# Patient Record
Sex: Male | Born: 1960
Health system: Southern US, Community
[De-identification: ages and names within clinical notes are randomized; demographics above are authoritative.]

## PROBLEM LIST (undated history)

## (undated) DIAGNOSIS — E785 Hyperlipidemia, unspecified: Secondary | ICD-10-CM

## (undated) DIAGNOSIS — I739 Peripheral vascular disease, unspecified: Secondary | ICD-10-CM

## (undated) DIAGNOSIS — I10 Essential (primary) hypertension: Secondary | ICD-10-CM

## (undated) DIAGNOSIS — I2510 Atherosclerotic heart disease of native coronary artery without angina pectoris: Secondary | ICD-10-CM

## (undated) DIAGNOSIS — G5603 Carpal tunnel syndrome, bilateral upper limbs: Secondary | ICD-10-CM

## (undated) DIAGNOSIS — K5730 Diverticulosis of large intestine without perforation or abscess without bleeding: Secondary | ICD-10-CM

## (undated) DIAGNOSIS — F1010 Alcohol abuse, uncomplicated: Secondary | ICD-10-CM

## (undated) DIAGNOSIS — Z8614 Personal history of Methicillin resistant Staphylococcus aureus infection: Secondary | ICD-10-CM

## (undated) DIAGNOSIS — D649 Anemia, unspecified: Secondary | ICD-10-CM

## (undated) DIAGNOSIS — K760 Fatty (change of) liver, not elsewhere classified: Secondary | ICD-10-CM

## (undated) DIAGNOSIS — M1990 Unspecified osteoarthritis, unspecified site: Secondary | ICD-10-CM

## (undated) DIAGNOSIS — R030 Elevated blood-pressure reading, without diagnosis of hypertension: Secondary | ICD-10-CM

## (undated) DIAGNOSIS — R7401 Elevation of levels of liver transaminase levels: Secondary | ICD-10-CM

## (undated) DIAGNOSIS — I700 Atherosclerosis of aorta: Secondary | ICD-10-CM

## (undated) DIAGNOSIS — I5189 Other ill-defined heart diseases: Secondary | ICD-10-CM

## (undated) HISTORY — PX: COLONOSCOPY: SHX174

## (undated) HISTORY — PX: SKIN GRAFT: SHX250

---

## 2007-01-31 ENCOUNTER — Inpatient Hospital Stay: Payer: Self-pay | Admitting: Internal Medicine

## 2007-03-08 ENCOUNTER — Encounter: Payer: Self-pay | Admitting: General Practice

## 2011-01-27 ENCOUNTER — Emergency Department: Payer: Self-pay | Admitting: *Deleted

## 2013-08-08 ENCOUNTER — Emergency Department: Payer: Self-pay | Admitting: Internal Medicine

## 2014-08-21 ENCOUNTER — Ambulatory Visit: Payer: Self-pay | Admitting: Internal Medicine

## 2014-11-25 ENCOUNTER — Emergency Department
Admit: 2014-11-25 | Disposition: A | Discharge status: Home / Self Care | Payer: Self-pay | Admitting: Emergency Medicine

## 2014-11-25 LAB — COMPREHENSIVE METABOLIC PANEL
ALBUMIN: 4.1 g/dL
ALK PHOS: 75 U/L
ALT: 103 U/L — AB
ANION GAP: 17 — AB (ref 7–16)
BUN: 13 mg/dL
Bilirubin,Total: 1.2 mg/dL
Calcium, Total: 8.4 mg/dL — ABNORMAL LOW
Chloride: 107 mmol/L
Co2: 16 mmol/L — ABNORMAL LOW
Creatinine: 1.05 mg/dL
EGFR (Non-African Amer.): >60
GLUCOSE: 91 mg/dL
Potassium: 3.6 mmol/L
SGOT(AST): 171 U/L — ABNORMAL HIGH
Sodium: 140 mmol/L
TOTAL PROTEIN: 7.2 g/dL

## 2014-11-25 LAB — CBC
HCT: 41.8 % (ref 40.0–52.0)
HGB: 14.1 g/dL (ref 13.0–18.0)
MCH: 34.3 pg — ABNORMAL HIGH (ref 26.0–34.0)
MCHC: 33.9 g/dL (ref 32.0–36.0)
MCV: 101 fL — ABNORMAL HIGH (ref 80–100)
Platelet: 276 10*3/uL (ref 150–440)
RBC: 4.12 10*6/uL — AB (ref 4.40–5.90)
RDW: 13 % (ref 11.5–14.5)
WBC: 14.5 10*3/uL — AB (ref 3.8–10.6)

## 2014-11-28 DIAGNOSIS — T23209A Burn of second degree of unspecified hand, unspecified site, initial encounter: Secondary | ICD-10-CM | POA: Insufficient documentation

## 2014-11-28 DIAGNOSIS — T22299A Burn of second degree of multiple sites of unspecified shoulder and upper limb, except wrist and hand, initial encounter: Secondary | ICD-10-CM | POA: Insufficient documentation

## 2015-09-10 DIAGNOSIS — E785 Hyperlipidemia, unspecified: Secondary | ICD-10-CM | POA: Diagnosis not present

## 2015-09-10 DIAGNOSIS — N400 Enlarged prostate without lower urinary tract symptoms: Secondary | ICD-10-CM | POA: Diagnosis not present

## 2015-09-18 DIAGNOSIS — M545 Low back pain: Secondary | ICD-10-CM | POA: Diagnosis not present

## 2017-02-01 DIAGNOSIS — L237 Allergic contact dermatitis due to plants, except food: Secondary | ICD-10-CM | POA: Diagnosis not present

## 2017-02-01 DIAGNOSIS — L309 Dermatitis, unspecified: Secondary | ICD-10-CM | POA: Diagnosis not present

## 2017-11-26 DIAGNOSIS — S0081XA Abrasion of other part of head, initial encounter: Secondary | ICD-10-CM | POA: Diagnosis not present

## 2017-11-26 DIAGNOSIS — S63609A Unspecified sprain of unspecified thumb, initial encounter: Secondary | ICD-10-CM | POA: Diagnosis not present

## 2017-11-26 DIAGNOSIS — S0083XA Contusion of other part of head, initial encounter: Secondary | ICD-10-CM | POA: Diagnosis not present

## 2017-12-08 DIAGNOSIS — M5412 Radiculopathy, cervical region: Secondary | ICD-10-CM | POA: Diagnosis not present

## 2017-12-08 DIAGNOSIS — K210 Gastro-esophageal reflux disease with esophagitis: Secondary | ICD-10-CM | POA: Diagnosis not present

## 2017-12-08 DIAGNOSIS — I10 Essential (primary) hypertension: Secondary | ICD-10-CM | POA: Diagnosis not present

## 2017-12-08 DIAGNOSIS — M5030 Other cervical disc degeneration, unspecified cervical region: Secondary | ICD-10-CM | POA: Diagnosis not present

## 2017-12-08 DIAGNOSIS — M545 Low back pain: Secondary | ICD-10-CM | POA: Diagnosis not present

## 2017-12-08 DIAGNOSIS — E785 Hyperlipidemia, unspecified: Secondary | ICD-10-CM | POA: Diagnosis not present

## 2017-12-08 DIAGNOSIS — M1990 Unspecified osteoarthritis, unspecified site: Secondary | ICD-10-CM | POA: Diagnosis not present

## 2017-12-08 DIAGNOSIS — N400 Enlarged prostate without lower urinary tract symptoms: Secondary | ICD-10-CM | POA: Diagnosis not present

## 2018-01-19 DIAGNOSIS — I10 Essential (primary) hypertension: Secondary | ICD-10-CM | POA: Diagnosis not present

## 2018-01-19 DIAGNOSIS — M5412 Radiculopathy, cervical region: Secondary | ICD-10-CM | POA: Diagnosis not present

## 2018-01-19 DIAGNOSIS — M5030 Other cervical disc degeneration, unspecified cervical region: Secondary | ICD-10-CM | POA: Diagnosis not present

## 2018-01-19 DIAGNOSIS — N400 Enlarged prostate without lower urinary tract symptoms: Secondary | ICD-10-CM | POA: Diagnosis not present

## 2018-01-19 DIAGNOSIS — M545 Low back pain: Secondary | ICD-10-CM | POA: Diagnosis not present

## 2018-01-19 DIAGNOSIS — M1990 Unspecified osteoarthritis, unspecified site: Secondary | ICD-10-CM | POA: Diagnosis not present

## 2018-01-19 DIAGNOSIS — K210 Gastro-esophageal reflux disease with esophagitis: Secondary | ICD-10-CM | POA: Diagnosis not present

## 2018-01-19 DIAGNOSIS — D7589 Other specified diseases of blood and blood-forming organs: Secondary | ICD-10-CM | POA: Diagnosis not present

## 2018-01-19 DIAGNOSIS — E785 Hyperlipidemia, unspecified: Secondary | ICD-10-CM | POA: Diagnosis not present

## 2018-03-05 DIAGNOSIS — L0201 Cutaneous abscess of face: Secondary | ICD-10-CM | POA: Diagnosis not present

## 2018-09-19 DIAGNOSIS — M1990 Unspecified osteoarthritis, unspecified site: Secondary | ICD-10-CM | POA: Diagnosis not present

## 2018-09-19 DIAGNOSIS — D7589 Other specified diseases of blood and blood-forming organs: Secondary | ICD-10-CM | POA: Diagnosis not present

## 2018-09-19 DIAGNOSIS — E785 Hyperlipidemia, unspecified: Secondary | ICD-10-CM | POA: Diagnosis not present

## 2018-09-19 DIAGNOSIS — M545 Low back pain: Secondary | ICD-10-CM | POA: Diagnosis not present

## 2018-09-19 DIAGNOSIS — I10 Essential (primary) hypertension: Secondary | ICD-10-CM | POA: Diagnosis not present

## 2018-09-19 DIAGNOSIS — N400 Enlarged prostate without lower urinary tract symptoms: Secondary | ICD-10-CM | POA: Diagnosis not present

## 2018-09-19 DIAGNOSIS — K210 Gastro-esophageal reflux disease with esophagitis: Secondary | ICD-10-CM | POA: Diagnosis not present

## 2018-09-19 DIAGNOSIS — M5412 Radiculopathy, cervical region: Secondary | ICD-10-CM | POA: Diagnosis not present

## 2018-09-19 DIAGNOSIS — M5030 Other cervical disc degeneration, unspecified cervical region: Secondary | ICD-10-CM | POA: Diagnosis not present

## 2018-09-20 ENCOUNTER — Other Ambulatory Visit: Payer: Self-pay | Admitting: Internal Medicine

## 2018-09-20 DIAGNOSIS — M5412 Radiculopathy, cervical region: Secondary | ICD-10-CM

## 2018-09-29 ENCOUNTER — Ambulatory Visit: Payer: Self-pay

## 2018-10-28 DIAGNOSIS — N400 Enlarged prostate without lower urinary tract symptoms: Secondary | ICD-10-CM | POA: Diagnosis not present

## 2018-10-28 DIAGNOSIS — K210 Gastro-esophageal reflux disease with esophagitis: Secondary | ICD-10-CM | POA: Diagnosis not present

## 2018-10-28 DIAGNOSIS — E785 Hyperlipidemia, unspecified: Secondary | ICD-10-CM | POA: Diagnosis not present

## 2018-10-28 DIAGNOSIS — A059 Bacterial foodborne intoxication, unspecified: Secondary | ICD-10-CM | POA: Diagnosis not present

## 2018-10-28 DIAGNOSIS — M5412 Radiculopathy, cervical region: Secondary | ICD-10-CM | POA: Diagnosis not present

## 2018-10-28 DIAGNOSIS — D7589 Other specified diseases of blood and blood-forming organs: Secondary | ICD-10-CM | POA: Diagnosis not present

## 2018-10-28 DIAGNOSIS — M5030 Other cervical disc degeneration, unspecified cervical region: Secondary | ICD-10-CM | POA: Diagnosis not present

## 2018-10-28 DIAGNOSIS — M545 Low back pain: Secondary | ICD-10-CM | POA: Diagnosis not present

## 2018-10-28 DIAGNOSIS — I10 Essential (primary) hypertension: Secondary | ICD-10-CM | POA: Diagnosis not present

## 2018-11-08 MED FILL — MELOXICAM 15 MG TABLET: 15 | 30 days supply | Qty: 30 | Fill #0

## 2018-11-08 MED FILL — GABAPENTIN 300 MG CAPSULE: 300 | 30 days supply | Qty: 90 | Fill #0

## 2018-11-11 MED FILL — CHLORTHALIDONE 25 MG TABS: 25 | 30 days supply | Qty: 15 | Fill #0

## 2018-12-02 MED FILL — MELOXICAM 15 MG TABLET: 15 | 30 days supply | Qty: 30 | Fill #0 | Status: TO

## 2018-12-03 MED FILL — GABAPENTIN 300 MG CAPSULE: 300 | 30 days supply | Qty: 90 | Fill #0

## 2018-12-05 MED FILL — CHLORTHALIDONE 25 MG TABS: 25 | 30 days supply | Qty: 15 | Fill #0 | Status: TO

## 2018-12-27 MED FILL — GABAPENTIN 300 MG CAPSULE: 300 | 30 days supply | Qty: 90 | Fill #0

## 2019-01-28 DIAGNOSIS — Z03818 Encounter for observation for suspected exposure to other biological agents ruled out: Secondary | ICD-10-CM | POA: Diagnosis not present

## 2019-01-28 DIAGNOSIS — R52 Pain, unspecified: Secondary | ICD-10-CM | POA: Diagnosis not present

## 2019-01-28 DIAGNOSIS — R110 Nausea: Secondary | ICD-10-CM | POA: Diagnosis not present

## 2019-02-14 DIAGNOSIS — D7589 Other specified diseases of blood and blood-forming organs: Secondary | ICD-10-CM | POA: Diagnosis not present

## 2019-02-14 DIAGNOSIS — M5412 Radiculopathy, cervical region: Secondary | ICD-10-CM | POA: Diagnosis not present

## 2019-02-14 DIAGNOSIS — N400 Enlarged prostate without lower urinary tract symptoms: Secondary | ICD-10-CM | POA: Diagnosis not present

## 2019-02-14 DIAGNOSIS — E785 Hyperlipidemia, unspecified: Secondary | ICD-10-CM | POA: Diagnosis not present

## 2019-02-14 DIAGNOSIS — M5030 Other cervical disc degeneration, unspecified cervical region: Secondary | ICD-10-CM | POA: Diagnosis not present

## 2019-02-14 DIAGNOSIS — M545 Low back pain: Secondary | ICD-10-CM | POA: Diagnosis not present

## 2019-02-14 DIAGNOSIS — F17210 Nicotine dependence, cigarettes, uncomplicated: Secondary | ICD-10-CM | POA: Diagnosis not present

## 2019-02-14 DIAGNOSIS — Z1331 Encounter for screening for depression: Secondary | ICD-10-CM | POA: Diagnosis not present

## 2019-02-14 DIAGNOSIS — I10 Essential (primary) hypertension: Secondary | ICD-10-CM | POA: Diagnosis not present

## 2019-02-14 DIAGNOSIS — K210 Gastro-esophageal reflux disease with esophagitis: Secondary | ICD-10-CM | POA: Diagnosis not present

## 2019-02-14 DIAGNOSIS — M1990 Unspecified osteoarthritis, unspecified site: Secondary | ICD-10-CM | POA: Diagnosis not present

## 2019-02-18 ENCOUNTER — Encounter: Payer: Self-pay | Admitting: Emergency Medicine

## 2019-02-18 ENCOUNTER — Emergency Department: Payer: 59

## 2019-02-18 ENCOUNTER — Other Ambulatory Visit: Payer: Self-pay

## 2019-02-18 ENCOUNTER — Emergency Department
Admission: EM | Admit: 2019-02-18 | Discharge: 2019-02-18 | Disposition: A | Discharge status: Home / Self Care | Payer: 59 | Attending: Emergency Medicine | Admitting: Emergency Medicine

## 2019-02-18 DIAGNOSIS — R070 Pain in throat: Secondary | ICD-10-CM | POA: Insufficient documentation

## 2019-02-18 DIAGNOSIS — B349 Viral infection, unspecified: Secondary | ICD-10-CM

## 2019-02-18 DIAGNOSIS — Z20828 Contact with and (suspected) exposure to other viral communicable diseases: Secondary | ICD-10-CM | POA: Insufficient documentation

## 2019-02-18 DIAGNOSIS — R0789 Other chest pain: Secondary | ICD-10-CM | POA: Insufficient documentation

## 2019-02-18 DIAGNOSIS — R0602 Shortness of breath: Secondary | ICD-10-CM | POA: Diagnosis not present

## 2019-02-18 DIAGNOSIS — R079 Chest pain, unspecified: Secondary | ICD-10-CM | POA: Diagnosis not present

## 2019-02-18 DIAGNOSIS — F17200 Nicotine dependence, unspecified, uncomplicated: Secondary | ICD-10-CM | POA: Insufficient documentation

## 2019-02-18 DIAGNOSIS — I10 Essential (primary) hypertension: Secondary | ICD-10-CM | POA: Insufficient documentation

## 2019-02-18 HISTORY — DX: Essential (primary) hypertension: I10

## 2019-02-18 LAB — CBC
HCT: 45.2 % (ref 39.0–52.0)
Hemoglobin: 15.9 g/dL (ref 13.0–17.0)
MCH: 34.3 pg — ABNORMAL HIGH (ref 26.0–34.0)
MCHC: 35.2 g/dL (ref 30.0–36.0)
MCV: 97.4 fL (ref 80.0–100.0)
Platelets: 246 10*3/uL (ref 150–400)
RBC: 4.64 MIL/uL (ref 4.22–5.81)
RDW: 12.8 % (ref 11.5–15.5)
WBC: 9.1 10*3/uL (ref 4.0–10.5)
nRBC: 0 % (ref 0.0–0.2)

## 2019-02-18 LAB — BASIC METABOLIC PANEL
Anion gap: 16 — ABNORMAL HIGH (ref 5–15)
BUN: 11 mg/dL (ref 6–20)
CO2: 21 mmol/L — ABNORMAL LOW (ref 22–32)
Calcium: 9.6 mg/dL (ref 8.9–10.3)
Chloride: 100 mmol/L (ref 98–111)
Creatinine, Ser: 0.86 mg/dL (ref 0.61–1.24)
GFR calc Af Amer: >60 mL/min (ref >60)
GFR calc non Af Amer: >60 mL/min (ref >60)
Glucose, Bld: 112 mg/dL — ABNORMAL HIGH (ref 70–99)
Potassium: 3.4 mmol/L — ABNORMAL LOW (ref 3.5–5.1)
Sodium: 137 mmol/L (ref 135–145)

## 2019-02-18 LAB — TROPONIN I (HIGH SENSITIVITY): Troponin I (High Sensitivity): 6 ng/L (ref <18)

## 2019-02-18 MED ORDER — ONDANSETRON 4 MG PO TBDP
4.0000 mg | ORAL_TABLET | Freq: Once | ORAL | Status: AC
  Administered 2019-02-18: 4 mg via ORAL
  Filled 2019-02-18: qty 1

## 2019-02-18 NOTE — ED Provider Notes (Signed)
[facility] Emergency Department Provider Note   ____________________________________________    I have reviewed the triage vital signs and the nursing notes.   HISTORY  Chief Complaint Chest discomfort, myalgias,    HPI Ryan Rose is a 58 y.o. male who presents with complaints of chest discomfort, sore throat, body aches, sweating.  Patient reports yesterday morning he developed a burning in his chest which resolved after about 15 minutes.  The rest of the day he describes sore throat, body aches, sweating and mild nausea.  He reports he had this 2 weeks ago and had a COVID swab which was negative.  However he does think that he has been exposed to coronavirus at work.  Does not know if he has had fevers.  Currently reports he is feeling somewhat better.  Does not have any chest pain now.   Past Medical History:  Diagnosis Date  . Hypertension     There are no active problems to display for this patient.     Prior to Admission medications   Not on File     Allergies Sulfa antibiotics  No family history on file.  Social History Social History   Tobacco Use  . Smoking status: Current Every Day Smoker  . Smokeless tobacco: Never Used  Substance Use Topics  . Alcohol use: Yes    Comment: occasional  . Drug use: Not on file    Review of Systems  Constitutional: No fever/chills Eyes: No visual changes.  ENT: Mild sore throat Cardiovascular: As above Respiratory: No shortness of breath Gastrointestinal: No abdominal pain.  No nausea, no vomiting.   Genitourinary: Negative for dysuria. Musculoskeletal: Myalgias Skin: Negative for rash. Neurological: Negative for headaches    ____________________________________________   PHYSICAL EXAM:  VITAL SIGNS: ED Triage Vitals  Enc Vitals Group     BP 02/18/19 0254 (!) 152/100     Pulse Rate 02/18/19 0254 100     Resp 02/18/19 0254 18     Temp 02/18/19 0254 98.6 F (37 C)      Temp Source 02/18/19 0254 Oral     SpO2 02/18/19 0254 98 %     Weight 02/18/19 0248 78 kg (172 lb)     Height 02/18/19 0248 1.74 m (5' 8.5")     Head Circumference --      Peak Flow --      Pain Score 02/18/19 0247 5     Pain Loc --      Pain Edu? --      Excl. in [place]? --     Constitutional: Alert and oriented.  Eyes: Conjunctivae are normal.  Head: Atraumatic. Nose: No congestion/rhinnorhea. Mouth/Throat: Mucous membranes are moist.   Neck:  Painless ROM Cardiovascular: Normal rate, regular rhythm.  Good peripheral circulation. Respiratory: Normal respiratory effort.  No retractions.  Gastrointestinal: Soft and nontender. No distention.  No CVA tenderness.  Musculoskeletal: No lower extremity tenderness nor edema.  Warm and well perfused Neurologic:  Normal speech and language. No gross focal neurologic deficits are appreciated.  Skin:  Skin is warm, dry and intact. No rash noted. Psychiatric: Mood and affect are normal. Speech and behavior are normal.  ____________________________________________   LABS (all labs ordered are listed, but only abnormal results are displayed)  Labs Reviewed  BASIC METABOLIC PANEL - Abnormal; Notable for the following components:      Result Value   Potassium 3.4 (*)    CO2 21 (*)    Glucose,  Bld 112 (*)    Anion gap 16 (*)    All other components within normal limits  CBC - Abnormal; Notable for the following components:   MCH 34.3 (*)    All other components within normal limits  NOVEL CORONAVIRUS, NAA (HOSPITAL ORDER, SEND-OUT TO REF LAB)  TROPONIN I (HIGH SENSITIVITY)  TROPONIN I (HIGH SENSITIVITY)   ____________________________________________  EKG  ED ECG REPORT I, Jene Everyobert , the attending physician, personally viewed and interpreted this ECG.  Date: 02/18/2019  Rhythm: normal sinus rhythm QRS Axis: normal Intervals: normal ST/T Wave abnormalities: normal Narrative Interpretation: no evidence of acute ischemia   ____________________________________________  RADIOLOGY  Chest x-ray unremarkable ____________________________________________   PROCEDURES  Procedure(s) performed: No  Procedures   Critical Care performed: No ____________________________________________   INITIAL IMPRESSION / ASSESSMENT AND PLAN / ED COURSE  Pertinent labs & imaging results that were available during my care of the patient were reviewed by me and considered in my medical decision making (see chart for details).  Patient presents with reports of burning chest discomfort yesterday morning, which resolved relatively quickly, suspicious for gastritis/GERD.  He also describes having body aches, cramps in his legs and feeling "sweaty yesterday ".  Has not had any recurrence of chest pain.  Denies shortness of breath .  No cough.  Negative COVID swab 2 weeks ago.,  Given symptoms we will resend test.  EKG is normal, HPI not consistent with ACS PE pericarditis.  Labs overall quite reassuring.  Appropriate for discharge with outpatient follow-up given that the patient is asymptomatic at this time..  Follow-up with PCP regarding continued elevated blood pressure    ____________________________________________   FINAL CLINICAL IMPRESSION(S) / ED DIAGNOSES  Final diagnoses:  SOB (shortness of breath)  Chest pain        Note:  This document was prepared using Dragon voice recognition software and may include unintentional dictation errors.   Jene Every, , MD 02/18/19 [phone]

## 2019-02-18 NOTE — ED Triage Notes (Signed)
Pt to triage via wheelchair. Pt reprots chest pain that started on Friday morning. Pt reports his blood pressure has been high and he has been feeling short of breath. Pt states he also has a sore throat. Pt reports the chest pain felt like heart burn. 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

## 2019-02-20 LAB — NOVEL CORONAVIRUS, NAA (HOSP ORDER, SEND-OUT TO REF LAB; TAT 18-24 HRS): SARS-CoV-2, NAA: NOT DETECTED

## 2019-02-21 ENCOUNTER — Telehealth: Payer: Self-pay | Admitting: Emergency Medicine

## 2019-02-21 NOTE — Telephone Encounter (Signed)
Called patient and infromed of negative covid 19 test result.

## 2019-03-17 DIAGNOSIS — M5412 Radiculopathy, cervical region: Secondary | ICD-10-CM | POA: Diagnosis not present

## 2019-03-17 DIAGNOSIS — E785 Hyperlipidemia, unspecified: Secondary | ICD-10-CM | POA: Diagnosis not present

## 2019-03-17 DIAGNOSIS — K210 Gastro-esophageal reflux disease with esophagitis: Secondary | ICD-10-CM | POA: Diagnosis not present

## 2019-03-17 DIAGNOSIS — M5030 Other cervical disc degeneration, unspecified cervical region: Secondary | ICD-10-CM | POA: Diagnosis not present

## 2019-03-17 DIAGNOSIS — I10 Essential (primary) hypertension: Secondary | ICD-10-CM | POA: Diagnosis not present

## 2019-03-17 DIAGNOSIS — D7589 Other specified diseases of blood and blood-forming organs: Secondary | ICD-10-CM | POA: Diagnosis not present

## 2019-03-17 DIAGNOSIS — M545 Low back pain: Secondary | ICD-10-CM | POA: Diagnosis not present

## 2019-03-17 DIAGNOSIS — M1990 Unspecified osteoarthritis, unspecified site: Secondary | ICD-10-CM | POA: Diagnosis not present

## 2019-03-17 DIAGNOSIS — F17200 Nicotine dependence, unspecified, uncomplicated: Secondary | ICD-10-CM | POA: Diagnosis not present

## 2019-04-27 ENCOUNTER — Other Ambulatory Visit
Admission: AD | Admit: 2019-04-27 | Discharge: 2019-04-27 | Disposition: A | Discharge status: Home / Self Care | Payer: Worker's Compensation | Attending: Family Medicine | Admitting: Family Medicine

## 2019-07-17 ENCOUNTER — Other Ambulatory Visit
Admission: RE | Admit: 2019-07-17 | Discharge: 2019-07-17 | Disposition: A | Discharge status: Home / Self Care | Payer: Worker's Compensation | Attending: Family Medicine | Admitting: Family Medicine

## 2019-11-03 ENCOUNTER — Ambulatory Visit: Payer: 59 | Attending: Internal Medicine

## 2019-11-03 ENCOUNTER — Ambulatory Visit: Payer: Self-pay

## 2019-11-03 DIAGNOSIS — Z23 Encounter for immunization: Secondary | ICD-10-CM

## 2019-11-03 NOTE — Progress Notes (Signed)
   Covid-19 Vaccination Clinic  Name:  Ryan Rose    MRN: [id number] DOB: 19-Mar-1961  11/03/2019  [name] was observed post Covid-19 immunization for 15 minutes without incident. He was provided with Vaccine Information Sheet and instruction to access the V-Safe system.   [name] was instructed to call 911 with any severe reactions post vaccine: Marland Kitchen Difficulty breathing  . Swelling of face and throat  . A fast heartbeat  . A bad rash all over body  . Dizziness and weakness   Immunizations Administered    Name Date Dose VIS Date Route   Pfizer COVID-19 Vaccine 11/03/2019  8:59 AM 0.3 mL 07/21/2019 Intramuscular   Manufacturer: [employer], [employer]   Lot: [id number]   NDC: [address]-0

## 2019-11-29 ENCOUNTER — Ambulatory Visit: Payer: 59 | Attending: Internal Medicine

## 2019-11-29 DIAGNOSIS — Z23 Encounter for immunization: Secondary | ICD-10-CM

## 2019-11-29 NOTE — Progress Notes (Signed)
   Covid-19 Vaccination Clinic  Name:  Ryan Rose    MRN: [id number] DOB: [birth year]  11/29/2019  [name] was observed post Covid-19 immunization for 15 minutes without incident. He was provided with Vaccine Information Sheet and instruction to access the V-Safe system.   [name] was instructed to call 911 with any severe reactions post vaccine: Marland Kitchen Difficulty breathing  . Swelling of face and throat  . A fast heartbeat  . A bad rash all over body  . Dizziness and weakness   Immunizations Administered    Name Date Dose VIS Date Route   Pfizer COVID-19 Vaccine 11/29/2019  8:16 AM 0.3 mL 10/04/2018 Intramuscular   Manufacturer: [employer], [employer]   Lot: [id number]   NDC: [address]-1

## 2020-05-09 DIAGNOSIS — R2681 Unsteadiness on feet: Secondary | ICD-10-CM | POA: Diagnosis not present

## 2020-05-09 DIAGNOSIS — R200 Anesthesia of skin: Secondary | ICD-10-CM | POA: Diagnosis not present

## 2020-05-09 DIAGNOSIS — R29898 Other symptoms and signs involving the musculoskeletal system: Secondary | ICD-10-CM | POA: Diagnosis not present

## 2020-06-04 DIAGNOSIS — R202 Paresthesia of skin: Secondary | ICD-10-CM | POA: Diagnosis not present

## 2020-06-04 DIAGNOSIS — R29898 Other symptoms and signs involving the musculoskeletal system: Secondary | ICD-10-CM | POA: Diagnosis not present

## 2020-06-04 DIAGNOSIS — E519 Thiamine deficiency, unspecified: Secondary | ICD-10-CM | POA: Diagnosis not present

## 2020-06-04 DIAGNOSIS — E559 Vitamin D deficiency, unspecified: Secondary | ICD-10-CM | POA: Diagnosis not present

## 2020-06-04 DIAGNOSIS — R7309 Other abnormal glucose: Secondary | ICD-10-CM | POA: Diagnosis not present

## 2020-06-04 DIAGNOSIS — R200 Anesthesia of skin: Secondary | ICD-10-CM | POA: Diagnosis not present

## 2020-06-04 DIAGNOSIS — E531 Pyridoxine deficiency: Secondary | ICD-10-CM | POA: Diagnosis not present

## 2020-06-06 ENCOUNTER — Other Ambulatory Visit: Payer: Self-pay | Admitting: Neurology

## 2020-07-10 DIAGNOSIS — R29898 Other symptoms and signs involving the musculoskeletal system: Secondary | ICD-10-CM | POA: Diagnosis not present

## 2020-07-15 DIAGNOSIS — R200 Anesthesia of skin: Secondary | ICD-10-CM | POA: Diagnosis not present

## 2020-07-15 DIAGNOSIS — R29898 Other symptoms and signs involving the musculoskeletal system: Secondary | ICD-10-CM | POA: Diagnosis not present

## 2020-07-15 DIAGNOSIS — R202 Paresthesia of skin: Secondary | ICD-10-CM | POA: Diagnosis not present

## 2020-08-01 DIAGNOSIS — G5603 Carpal tunnel syndrome, bilateral upper limbs: Secondary | ICD-10-CM | POA: Diagnosis not present

## 2020-08-22 DIAGNOSIS — G5603 Carpal tunnel syndrome, bilateral upper limbs: Secondary | ICD-10-CM | POA: Diagnosis not present

## 2020-08-27 DIAGNOSIS — G5603 Carpal tunnel syndrome, bilateral upper limbs: Secondary | ICD-10-CM | POA: Insufficient documentation

## 2020-12-06 ENCOUNTER — Other Ambulatory Visit: Payer: Self-pay

## 2020-12-06 DIAGNOSIS — L729 Follicular cyst of the skin and subcutaneous tissue, unspecified: Secondary | ICD-10-CM | POA: Diagnosis not present

## 2020-12-06 DIAGNOSIS — L0201 Cutaneous abscess of face: Secondary | ICD-10-CM | POA: Diagnosis not present

## 2020-12-06 MED ORDER — DOXYCYCLINE HYCLATE 100 MG PO CAPS
ORAL_CAPSULE | ORAL | 0 refills | Status: DC
  Filled 2020-12-06: qty 14, 7d supply, fill #0

## 2021-05-12 DIAGNOSIS — E538 Deficiency of other specified B group vitamins: Secondary | ICD-10-CM | POA: Diagnosis not present

## 2021-05-12 DIAGNOSIS — E531 Pyridoxine deficiency: Secondary | ICD-10-CM | POA: Diagnosis not present

## 2021-05-12 DIAGNOSIS — E519 Thiamine deficiency, unspecified: Secondary | ICD-10-CM | POA: Diagnosis not present

## 2021-05-12 DIAGNOSIS — R29898 Other symptoms and signs involving the musculoskeletal system: Secondary | ICD-10-CM | POA: Diagnosis not present

## 2021-05-12 DIAGNOSIS — E559 Vitamin D deficiency, unspecified: Secondary | ICD-10-CM | POA: Diagnosis not present

## 2021-05-13 ENCOUNTER — Other Ambulatory Visit: Payer: Self-pay | Admitting: Student

## 2021-05-13 DIAGNOSIS — R29898 Other symptoms and signs involving the musculoskeletal system: Secondary | ICD-10-CM

## 2021-05-20 ENCOUNTER — Other Ambulatory Visit: Payer: Self-pay

## 2021-05-20 ENCOUNTER — Ambulatory Visit
Admission: RE | Admit: 2021-05-20 | Discharge: 2021-05-20 | Disposition: A | Discharge status: Home / Self Care | Payer: 59 | Source: Ambulatory Visit | Attending: Student | Admitting: Student

## 2021-05-20 DIAGNOSIS — R29898 Other symptoms and signs involving the musculoskeletal system: Secondary | ICD-10-CM

## 2021-05-20 DIAGNOSIS — M542 Cervicalgia: Secondary | ICD-10-CM | POA: Diagnosis not present

## 2021-05-20 DIAGNOSIS — R200 Anesthesia of skin: Secondary | ICD-10-CM | POA: Diagnosis not present

## 2021-05-21 ENCOUNTER — Other Ambulatory Visit: Payer: Self-pay

## 2021-05-21 MED ORDER — ERGOCALCIFEROL 1.25 MG (50000 UT) PO CAPS
ORAL_CAPSULE | ORAL | 0 refills | Status: DC
  Filled 2021-05-21: qty 8, 56d supply, fill #0

## 2021-06-26 DIAGNOSIS — G959 Disease of spinal cord, unspecified: Secondary | ICD-10-CM | POA: Diagnosis not present

## 2021-07-14 ENCOUNTER — Other Ambulatory Visit: Payer: Self-pay | Admitting: Neurosurgery

## 2021-08-06 ENCOUNTER — Encounter
Admission: RE | Admit: 2021-08-06 | Discharge: 2021-08-06 | Disposition: A | Discharge status: Home / Self Care | Payer: 59 | Source: Ambulatory Visit | Attending: Neurosurgery | Admitting: Neurosurgery

## 2021-08-06 ENCOUNTER — Inpatient Hospital Stay: Admission: RE | Admit: 2021-08-06 | Payer: 59 | Source: Ambulatory Visit

## 2021-08-06 ENCOUNTER — Other Ambulatory Visit: Payer: Self-pay

## 2021-08-06 DIAGNOSIS — Z01818 Encounter for other preprocedural examination: Secondary | ICD-10-CM | POA: Diagnosis not present

## 2021-08-06 DIAGNOSIS — Z01810 Encounter for preprocedural cardiovascular examination: Secondary | ICD-10-CM | POA: Diagnosis not present

## 2021-08-06 HISTORY — DX: Elevated blood-pressure reading, without diagnosis of hypertension: R03.0

## 2021-08-06 HISTORY — DX: Unspecified osteoarthritis, unspecified site: M19.90

## 2021-08-06 HISTORY — DX: Personal history of Methicillin resistant Staphylococcus aureus infection: Z86.14

## 2021-08-06 LAB — URINALYSIS, ROUTINE W REFLEX MICROSCOPIC
Bilirubin Urine: NEGATIVE
Glucose, UA: NEGATIVE mg/dL
Hgb urine dipstick: NEGATIVE
Ketones, ur: NEGATIVE mg/dL
Leukocytes,Ua: NEGATIVE
Nitrite: NEGATIVE
Protein, ur: 30 mg/dL — AB
Specific Gravity, Urine: 1.021 (ref 1.005–1.030)
pH: 5 (ref 5.0–8.0)

## 2021-08-06 LAB — TYPE AND SCREEN
ABO/RH(D): O POS
Antibody Screen: NEGATIVE

## 2021-08-06 LAB — SURGICAL PCR SCREEN
MRSA, PCR: NEGATIVE
Staphylococcus aureus: NEGATIVE

## 2021-08-06 LAB — CBC
HCT: 42.4 % (ref 39.0–52.0)
Hemoglobin: 14.2 g/dL (ref 13.0–17.0)
MCH: 32.9 pg (ref 26.0–34.0)
MCHC: 33.5 g/dL (ref 30.0–36.0)
MCV: 98.4 fL (ref 80.0–100.0)
Platelets: 279 10*3/uL (ref 150–400)
RBC: 4.31 MIL/uL (ref 4.22–5.81)
RDW: 12.8 % (ref 11.5–15.5)
WBC: 8.9 10*3/uL (ref 4.0–10.5)
nRBC: 0 % (ref 0.0–0.2)

## 2021-08-06 LAB — BASIC METABOLIC PANEL
Anion gap: 9 (ref 5–15)
BUN: 6 mg/dL — ABNORMAL LOW (ref 8–23)
CO2: 27 mmol/L (ref 22–32)
Calcium: 9.6 mg/dL (ref 8.9–10.3)
Chloride: 102 mmol/L (ref 98–111)
Creatinine, Ser: 0.69 mg/dL (ref 0.61–1.24)
GFR, Estimated: >60 mL/min (ref >60)
Glucose, Bld: 120 mg/dL — ABNORMAL HIGH (ref 70–99)
Potassium: 4 mmol/L (ref 3.5–5.1)
Sodium: 138 mmol/L (ref 135–145)

## 2021-08-06 LAB — APTT: aPTT: 31 seconds (ref 24–36)

## 2021-08-06 LAB — PROTIME-INR
INR: 1 (ref 0.8–1.2)
Prothrombin Time: 12.9 seconds (ref 11.4–15.2)

## 2021-08-06 NOTE — Patient Instructions (Addendum)
Your procedure is scheduled on:08-20-21 Wednesday Report to the Registration Desk on the 1st floor of the Medical Mall.Then proceed to the 2nd floor Surgery Desk in the Medical Mall To find out your arrival time, please call [phone] between 1PM - 3PM on:08-19-21 Tuesday  REMEMBER: Instructions that are not followed completely may result in serious medical risk, up to and including death; or upon the discretion of your surgeon and anesthesiologist your surgery may need to be rescheduled.  Do not eat food after midnight the night before surgery.  No gum chewing, lozengers or hard candies.  You may however, drink CLEAR liquids up to 2 hours before you are scheduled to arrive for your surgery. Do not drink anything within 2 hours of your scheduled arrival time.  Clear liquids include: - water  - apple juice without pulp - gatorade (not RED, PURPLE, OR BLUE) - black coffee or tea (Do NOT add milk or creamers to the coffee or tea) Do NOT drink anything that is not on this list.  Do not take any medication the day of surgery  Stop your Aspirin 7 days prior to surgery-Last dose on 08-12-21 Tuesday  One week prior to surgery: Stop Anti-inflammatories (NSAIDS) such as Advil, Aleve, Ibuprofen, Motrin, Naproxen, Naprosyn and Aspirin based products such as Excedrin, Goodys Powder, BC Powder.You may however, take Tylenol if needed for pain up until the day of surgery.  Stop ANY OVER THE COUNTER supplements/vitamins 7 days prior to surgery  No Alcohol for 24 hours before or after surgery.  No Smoking including e-cigarettes for 24 hours prior to surgery.  No chewable tobacco products for at least 6 hours prior to surgery.  No nicotine patches on the day of surgery.  Do not use any "recreational" drugs for at least a week prior to your surgery.  Please be advised that the combination of cocaine and anesthesia may have negative outcomes, up to and including death. If you test positive for  cocaine, your surgery will be cancelled.  On the morning of surgery brush your teeth with toothpaste and water, you may rinse your mouth with mouthwash if you wish. Do not swallow any toothpaste or mouthwash.  Use CHG Soap as directed on instruction sheet.  Do not wear jewelry, make-up, hairpins, clips or nail polish.  Do not wear lotions, powders, or perfumes.   Do not shave body from the neck down 48 hours prior to surgery just in case you cut yourself which could leave a site for infection.  Also, freshly shaved skin may become irritated if using the CHG soap.  Contact lenses, hearing aids and dentures may not be worn into surgery.  Do not bring valuables to the hospital. [facility] is not responsible for any missing/lost belongings or valuables.   Notify your doctor if there is any change in your medical condition (cold, fever, infection).  Wear comfortable clothing (specific to your surgery type) to the hospital.  After surgery, you can help prevent lung complications by doing breathing exercises.  Take deep breaths and cough every 1-2 hours. Your doctor may order a device called an Incentive Spirometer to help you take deep breaths. When coughing or sneezing, hold a pillow firmly against your incision with both hands. This is called splinting. Doing this helps protect your incision. It also decreases belly discomfort.  If you are being admitted to the hospital overnight, leave your suitcase in the car. After surgery it may be brought to your room.  If you  are being discharged the day of surgery, you will not be allowed to drive home. You will need a responsible adult (18 years or older) to drive you home and stay with you that night.   If you are taking public transportation, you will need to have a responsible adult (18 years or older) with you. Please confirm with your physician that it is acceptable to use public transportation.   Please call the Pre-admissions Testing  Dept. at [phone] if you have any questions about these instructions.  Surgery Visitation Policy:  Patients undergoing a surgery or procedure may have one family member or support person with them as long as that person is not COVID-19 positive or experiencing its symptoms.  That person may remain in the waiting area during the procedure and may rotate out with other people.  Inpatient Visitation:    Visiting hours are 7 a.m. to 8 p.m. Up to two visitors ages 16+ are allowed at one time in a patient room. The visitors may rotate out with other people during the day. Visitors must check out when they leave, or other visitors will not be allowed. One designated support person may remain overnight. The visitor must pass COVID-19 screenings, use hand sanitizer when entering and exiting the patients room and wear a mask at all times, including in the patients room. Patients must also wear a mask when staff or their visitor are in the room. Masking is required regardless of vaccination status.

## 2021-08-18 ENCOUNTER — Other Ambulatory Visit: Payer: Self-pay

## 2021-08-18 ENCOUNTER — Other Ambulatory Visit
Admission: RE | Admit: 2021-08-18 | Discharge: 2021-08-18 | Disposition: A | Discharge status: Home / Self Care | Payer: 59 | Source: Ambulatory Visit | Attending: Neurosurgery | Admitting: Neurosurgery

## 2021-08-18 DIAGNOSIS — Z01812 Encounter for preprocedural laboratory examination: Secondary | ICD-10-CM | POA: Insufficient documentation

## 2021-08-18 DIAGNOSIS — M4712 Other spondylosis with myelopathy, cervical region: Secondary | ICD-10-CM | POA: Diagnosis not present

## 2021-08-18 DIAGNOSIS — Z7982 Long term (current) use of aspirin: Secondary | ICD-10-CM | POA: Diagnosis not present

## 2021-08-18 DIAGNOSIS — R296 Repeated falls: Secondary | ICD-10-CM | POA: Diagnosis not present

## 2021-08-18 DIAGNOSIS — Z882 Allergy status to sulfonamides status: Secondary | ICD-10-CM | POA: Diagnosis not present

## 2021-08-18 DIAGNOSIS — I10 Essential (primary) hypertension: Secondary | ICD-10-CM | POA: Diagnosis not present

## 2021-08-18 DIAGNOSIS — M5001 Cervical disc disorder with myelopathy,  high cervical region: Secondary | ICD-10-CM | POA: Diagnosis not present

## 2021-08-18 DIAGNOSIS — F17200 Nicotine dependence, unspecified, uncomplicated: Secondary | ICD-10-CM | POA: Diagnosis not present

## 2021-08-18 DIAGNOSIS — G9520 Unspecified cord compression: Secondary | ICD-10-CM | POA: Diagnosis not present

## 2021-08-18 DIAGNOSIS — M5002 Cervical disc disorder with myelopathy, mid-cervical region, unspecified level: Secondary | ICD-10-CM | POA: Diagnosis not present

## 2021-08-18 DIAGNOSIS — M50021 Cervical disc disorder at C4-C5 level with myelopathy: Secondary | ICD-10-CM | POA: Diagnosis not present

## 2021-08-18 DIAGNOSIS — M4802 Spinal stenosis, cervical region: Secondary | ICD-10-CM | POA: Diagnosis not present

## 2021-08-18 DIAGNOSIS — G959 Disease of spinal cord, unspecified: Secondary | ICD-10-CM | POA: Diagnosis not present

## 2021-08-18 DIAGNOSIS — M47022 Vertebral artery compression syndromes, cervical region: Secondary | ICD-10-CM | POA: Diagnosis present

## 2021-08-18 DIAGNOSIS — Z981 Arthrodesis status: Secondary | ICD-10-CM | POA: Diagnosis not present

## 2021-08-18 DIAGNOSIS — Z01818 Encounter for other preprocedural examination: Secondary | ICD-10-CM | POA: Diagnosis not present

## 2021-08-18 DIAGNOSIS — Z20822 Contact with and (suspected) exposure to covid-19: Secondary | ICD-10-CM

## 2021-08-18 DIAGNOSIS — M50022 Cervical disc disorder at C5-C6 level with myelopathy: Secondary | ICD-10-CM | POA: Diagnosis not present

## 2021-08-19 LAB — SARS CORONAVIRUS 2 (TAT 6-24 HRS): SARS Coronavirus 2: NEGATIVE

## 2021-08-19 NOTE — Anesthesia Preprocedure Evaluation (Addendum)
Anesthesia Evaluation  Patient identified by MRN, date of birth, ID band Patient awake    Reviewed: Allergy & Precautions, NPO status , Patient's Chart, lab work & pertinent test results  Airway Mallampati: III  TM Distance: >3 FB Neck ROM: Full    Dental  (+) Edentulous Lower, Edentulous Upper   Pulmonary Current Smoker and Patient abstained from smoking.,    Pulmonary exam normal breath sounds clear to auscultation       Cardiovascular Exercise Tolerance: Poor (-) anginaNormal cardiovascular exam Rhythm:Regular Rate:Normal     Neuro/Psych cervical myelopathy with planned C3-6 laminoplasty  negative psych ROS   GI/Hepatic Neg liver ROS, GERD  Controlled,  Endo/Other  negative endocrine ROS  Renal/GU negative Renal ROS  negative genitourinary   Musculoskeletal  (+) Arthritis , Osteoarthritis,  Cane for mobility   Abdominal Normal abdominal exam  (+)   Peds negative pediatric ROS (+)  Hematology negative hematology ROS (+)   Anesthesia Other Findings Reports numbness is hands and near elbows. Reports that his legs give out on him. Strength exam grossly intact to large muscle groups of upper and lower extremity.   Reproductive/Obstetrics negative OB ROS                            Anesthesia Physical Anesthesia Plan  ASA: 2  Anesthesia Plan: General ETT   Post-op Pain Management:    Induction: Intravenous  PONV Risk Score and Plan: 3 and Ondansetron, Dexamethasone and Midazolam  Airway Management Planned: Oral ETT  Additional Equipment:   Intra-op Plan:   Post-operative Plan: Extubation in OR  Informed Consent: I have reviewed the patients History and Physical, chart, labs and discussed the procedure including the risks, benefits and alternatives for the proposed anesthesia with the patient or authorized representative who has indicated his/her understanding and acceptance.      Dental Advisory Given  Plan Discussed with: Anesthesiologist, CRNA and Surgeon  Anesthesia Plan Comments: (Patient consented for risks of anesthesia including but not limited to:  - adverse reactions to medications - damage to eyes, teeth, lips or other oral mucosa - nerve damage due to positioning  - sore throat or hoarseness - Damage to heart, brain, nerves, lungs, other parts of body or loss of life  Patient voiced understanding.)        Anesthesia Quick Evaluation

## 2021-08-20 ENCOUNTER — Inpatient Hospital Stay
Admission: RE | Admit: 2021-08-20 | Discharge: 2021-08-22 | DRG: 029 | Disposition: A | Discharge status: Home Health | Payer: 59 | Attending: Neurosurgery | Admitting: Neurosurgery

## 2021-08-20 ENCOUNTER — Other Ambulatory Visit: Payer: Self-pay

## 2021-08-20 ENCOUNTER — Inpatient Hospital Stay: Payer: 59

## 2021-08-20 ENCOUNTER — Inpatient Hospital Stay: Payer: 59 | Admitting: Anesthesiology

## 2021-08-20 ENCOUNTER — Encounter: Payer: Self-pay | Admitting: Neurosurgery

## 2021-08-20 ENCOUNTER — Encounter
Admission: RE | Disposition: A | Discharge status: Home / Self Care | Payer: Self-pay | Source: Home / Self Care | Attending: Neurosurgery

## 2021-08-20 DIAGNOSIS — G9520 Unspecified cord compression: Principal | ICD-10-CM | POA: Diagnosis present

## 2021-08-20 DIAGNOSIS — F17200 Nicotine dependence, unspecified, uncomplicated: Secondary | ICD-10-CM | POA: Diagnosis present

## 2021-08-20 DIAGNOSIS — G959 Disease of spinal cord, unspecified: Secondary | ICD-10-CM | POA: Diagnosis present

## 2021-08-20 DIAGNOSIS — Z01818 Encounter for other preprocedural examination: Secondary | ICD-10-CM | POA: Diagnosis not present

## 2021-08-20 DIAGNOSIS — Z419 Encounter for procedure for purposes other than remedying health state, unspecified: Secondary | ICD-10-CM

## 2021-08-20 DIAGNOSIS — M4712 Other spondylosis with myelopathy, cervical region: Secondary | ICD-10-CM | POA: Diagnosis present

## 2021-08-20 DIAGNOSIS — Z981 Arthrodesis status: Secondary | ICD-10-CM | POA: Diagnosis not present

## 2021-08-20 DIAGNOSIS — R296 Repeated falls: Secondary | ICD-10-CM | POA: Diagnosis present

## 2021-08-20 DIAGNOSIS — Z882 Allergy status to sulfonamides status: Secondary | ICD-10-CM

## 2021-08-20 DIAGNOSIS — Z20822 Contact with and (suspected) exposure to covid-19: Secondary | ICD-10-CM | POA: Diagnosis present

## 2021-08-20 DIAGNOSIS — Z7982 Long term (current) use of aspirin: Secondary | ICD-10-CM

## 2021-08-20 DIAGNOSIS — M4802 Spinal stenosis, cervical region: Secondary | ICD-10-CM | POA: Diagnosis present

## 2021-08-20 DIAGNOSIS — I10 Essential (primary) hypertension: Secondary | ICD-10-CM | POA: Diagnosis present

## 2021-08-20 DIAGNOSIS — M47022 Vertebral artery compression syndromes, cervical region: Secondary | ICD-10-CM | POA: Diagnosis present

## 2021-08-20 HISTORY — PX: CERVICAL LAMINOPLASTY: SHX1333

## 2021-08-20 LAB — ABO/RH: ABO/RH(D): O POS

## 2021-08-20 SURGERY — CERVICAL LAMINOPLASTY
Anesthesia: General | Site: Neck

## 2021-08-20 MED ORDER — ONDANSETRON HCL 4 MG/2ML IJ SOLN
INTRAMUSCULAR | Status: AC
  Filled 2021-08-20: qty 2

## 2021-08-20 MED ORDER — ONDANSETRON HCL 4 MG PO TABS: 4.0000 mg | ORAL_TABLET | Freq: Four times a day (QID) | ORAL | Status: DC | PRN

## 2021-08-20 MED ORDER — FENTANYL CITRATE (PF) 100 MCG/2ML IJ SOLN
INTRAMUSCULAR | Status: DC | PRN
  Administered 2021-08-20: 100 ug via INTRAVENOUS

## 2021-08-20 MED ORDER — MENTHOL 3 MG MT LOZG
1.0000 | LOZENGE | OROMUCOSAL | Status: DC | PRN
  Filled 2021-08-20: qty 9

## 2021-08-20 MED ORDER — ACETAMINOPHEN 10 MG/ML IV SOLN: 1000.0000 mg | Freq: Once | INTRAVENOUS | Status: DC | PRN

## 2021-08-20 MED ORDER — GLYCOPYRROLATE 0.2 MG/ML IJ SOLN
INTRAMUSCULAR | Status: DC | PRN
Start: 2021-08-20 — End: 2021-08-20
  Administered 2021-08-20: .2 mg via INTRAVENOUS

## 2021-08-20 MED ORDER — CHLORHEXIDINE GLUCONATE 0.12 % MT SOLN: 15.0000 mL | Freq: Once | OROMUCOSAL | Status: AC

## 2021-08-20 MED ORDER — OXYCODONE HCL 5 MG/5ML PO SOLN: 5.0000 mg | Freq: Once | ORAL | Status: DC | PRN

## 2021-08-20 MED ORDER — METHOCARBAMOL 500 MG PO TABS
ORAL_TABLET | ORAL | Status: AC
  Filled 2021-08-20: qty 1

## 2021-08-20 MED ORDER — PHENYLEPHRINE HCL-NACL 20-0.9 MG/250ML-% IV SOLN
INTRAVENOUS | Status: DC | PRN
  Administered 2021-08-20: 25 ug/min via INTRAVENOUS

## 2021-08-20 MED ORDER — ONDANSETRON HCL 4 MG/2ML IJ SOLN
INTRAMUSCULAR | Status: DC | PRN
  Administered 2021-08-20: 4 mg via INTRAVENOUS

## 2021-08-20 MED ORDER — LACTATED RINGERS IV SOLN: INTRAVENOUS | Status: DC

## 2021-08-20 MED ORDER — HYDROMORPHONE HCL 1 MG/ML IJ SOLN: 0.5000 mg | INTRAMUSCULAR | Status: AC | PRN

## 2021-08-20 MED ORDER — ACETAMINOPHEN 500 MG PO TABS
1000.0000 mg | ORAL_TABLET | Freq: Four times a day (QID) | ORAL | Status: AC
  Administered 2021-08-20: 1000 mg via ORAL

## 2021-08-20 MED ORDER — MIDAZOLAM HCL 2 MG/2ML IJ SOLN
INTRAMUSCULAR | Status: DC | PRN
  Administered 2021-08-20 (×2): 1 mg via INTRAVENOUS

## 2021-08-20 MED ORDER — PHENYLEPHRINE HCL-NACL 20-0.9 MG/250ML-% IV SOLN
INTRAVENOUS | Status: AC
  Filled 2021-08-20: qty 250

## 2021-08-20 MED ORDER — DEXAMETHASONE SODIUM PHOSPHATE 10 MG/ML IJ SOLN
INTRAMUSCULAR | Status: DC | PRN
  Administered 2021-08-20: 10 mg via INTRAVENOUS

## 2021-08-20 MED ORDER — OXYCODONE HCL 5 MG PO TABS
ORAL_TABLET | ORAL | Status: AC
  Filled 2021-08-20: qty 1

## 2021-08-20 MED ORDER — BUPIVACAINE HCL (PF) 0.5 % IJ SOLN
INTRAMUSCULAR | Status: AC
  Filled 2021-08-20: qty 30

## 2021-08-20 MED ORDER — PROPOFOL 1000 MG/100ML IV EMUL
INTRAVENOUS | Status: AC
  Filled 2021-08-20: qty 100

## 2021-08-20 MED ORDER — ESMOLOL HCL 100 MG/10ML IV SOLN
INTRAVENOUS | Status: AC
  Filled 2021-08-20: qty 10

## 2021-08-20 MED ORDER — FAMOTIDINE 20 MG PO TABS: 20.0000 mg | ORAL_TABLET | Freq: Once | ORAL | Status: AC

## 2021-08-20 MED ORDER — POLYETHYLENE GLYCOL 3350 17 G PO PACK
17.0000 g | PACK | Freq: Every day | ORAL | Status: DC | PRN
  Administered 2021-08-22: 17 g via ORAL
  Filled 2021-08-20 (×2): qty 1

## 2021-08-20 MED ORDER — BUPIVACAINE LIPOSOME 1.3 % IJ SUSP
INTRAMUSCULAR | Status: AC
  Filled 2021-08-20: qty 20

## 2021-08-20 MED ORDER — REMIFENTANIL HCL 1 MG IV SOLR
INTRAVENOUS | Status: AC
  Filled 2021-08-20: qty 1000

## 2021-08-20 MED ORDER — ONDANSETRON HCL 4 MG/2ML IJ SOLN: 4.0000 mg | Freq: Four times a day (QID) | INTRAMUSCULAR | Status: DC | PRN

## 2021-08-20 MED ORDER — FLEET ENEMA 7-19 GM/118ML RE ENEM: 1.0000 | ENEMA | Freq: Once | RECTAL | Status: DC | PRN

## 2021-08-20 MED ORDER — FENTANYL CITRATE (PF) 100 MCG/2ML IJ SOLN
INTRAMUSCULAR | Status: AC
  Filled 2021-08-20: qty 2

## 2021-08-20 MED ORDER — CEFAZOLIN SODIUM-DEXTROSE 2-4 GM/100ML-% IV SOLN
2.0000 g | INTRAVENOUS | Status: AC
  Administered 2021-08-20: 2 g via INTRAVENOUS

## 2021-08-20 MED ORDER — BACITRACIN ZINC 500 UNIT/GM EX OINT
TOPICAL_OINTMENT | CUTANEOUS | Status: AC
  Filled 2021-08-20: qty 28.35

## 2021-08-20 MED ORDER — ACETAMINOPHEN 500 MG PO TABS
ORAL_TABLET | ORAL | Status: AC
  Filled 2021-08-20: qty 2

## 2021-08-20 MED ORDER — PHENYLEPHRINE HCL (PRESSORS) 10 MG/ML IV SOLN
INTRAVENOUS | Status: DC | PRN
  Administered 2021-08-20: 160 ug via INTRAVENOUS
  Administered 2021-08-20: 240 ug via INTRAVENOUS

## 2021-08-20 MED ORDER — MIDAZOLAM HCL 2 MG/2ML IJ SOLN
INTRAMUSCULAR | Status: AC
  Filled 2021-08-20: qty 2

## 2021-08-20 MED ORDER — SURGIFLO WITH THROMBIN (HEMOSTATIC MATRIX KIT) OPTIME
TOPICAL | Status: DC | PRN
  Administered 2021-08-20: 1 via TOPICAL

## 2021-08-20 MED ORDER — 0.9 % SODIUM CHLORIDE (POUR BTL) OPTIME
TOPICAL | Status: DC | PRN
  Administered 2021-08-20: 500 mL

## 2021-08-20 MED ORDER — PROPOFOL 10 MG/ML IV BOLUS
INTRAVENOUS | Status: DC | PRN
Start: 2021-08-20 — End: 2021-08-20
  Administered 2021-08-20: 140 mg via INTRAVENOUS

## 2021-08-20 MED ORDER — BUPIVACAINE-EPINEPHRINE (PF) 0.5% -1:200000 IJ SOLN
INTRAMUSCULAR | Status: DC | PRN
  Administered 2021-08-20: 9 mL

## 2021-08-20 MED ORDER — ACETAMINOPHEN 10 MG/ML IV SOLN
INTRAVENOUS | Status: DC | PRN
Start: 2021-08-20 — End: 2021-08-20
  Administered 2021-08-20: 1000 mg via INTRAVENOUS

## 2021-08-20 MED ORDER — OXYCODONE HCL 5 MG PO TABS
ORAL_TABLET | ORAL | Status: AC
  Filled 2021-08-20: qty 2

## 2021-08-20 MED ORDER — KETOROLAC TROMETHAMINE 15 MG/ML IJ SOLN
INTRAMUSCULAR | Status: AC
  Administered 2021-08-20: 15 mg via INTRAVENOUS
  Filled 2021-08-20: qty 1

## 2021-08-20 MED ORDER — SODIUM CHLORIDE 0.9 % IV SOLN
INTRAVENOUS | Status: DC | PRN
  Administered 2021-08-20: .04 ug/kg/min via INTRAVENOUS

## 2021-08-20 MED ORDER — BACITRACIN 500 UNIT/GM EX OINT
TOPICAL_OINTMENT | CUTANEOUS | Status: DC | PRN
  Administered 2021-08-20: 1 via TOPICAL

## 2021-08-20 MED ORDER — PROMETHAZINE HCL 25 MG/ML IJ SOLN: 6.2500 mg | INTRAMUSCULAR | Status: DC | PRN

## 2021-08-20 MED ORDER — OXYCODONE HCL 5 MG PO TABS
10.0000 mg | ORAL_TABLET | ORAL | Status: DC | PRN
  Administered 2021-08-20: 10 mg via ORAL
  Administered 2021-08-20: 5 mg via ORAL

## 2021-08-20 MED ORDER — LACTATED RINGERS IV SOLN: INTRAVENOUS | Status: DC | PRN

## 2021-08-20 MED ORDER — OXYCODONE HCL 5 MG PO TABS: 5.0000 mg | ORAL_TABLET | Freq: Once | ORAL | Status: DC | PRN

## 2021-08-20 MED ORDER — SODIUM CHLORIDE 0.9% FLUSH
3.0000 mL | Freq: Two times a day (BID) | INTRAVENOUS | Status: DC
  Administered 2021-08-20 – 2021-08-22 (×3): 3 mL via INTRAVENOUS

## 2021-08-20 MED ORDER — PHENOL 1.4 % MT LIQD
1.0000 | OROMUCOSAL | Status: DC | PRN
  Filled 2021-08-20: qty 177

## 2021-08-20 MED ORDER — METHOCARBAMOL 500 MG PO TABS
500.0000 mg | ORAL_TABLET | Freq: Four times a day (QID) | ORAL | Status: DC | PRN
  Administered 2021-08-20 – 2021-08-22 (×2): 500 mg via ORAL

## 2021-08-20 MED ORDER — DIPHENHYDRAMINE HCL 25 MG PO CAPS: 25.0000 mg | ORAL_CAPSULE | Freq: Four times a day (QID) | ORAL | Status: DC | PRN

## 2021-08-20 MED ORDER — SENNA 8.6 MG PO TABS
1.0000 | ORAL_TABLET | Freq: Two times a day (BID) | ORAL | Status: DC
  Administered 2021-08-20 – 2021-08-22 (×4): 8.6 mg via ORAL
  Filled 2021-08-20 (×7): qty 1

## 2021-08-20 MED ORDER — SODIUM CHLORIDE FLUSH 0.9 % IV SOLN
INTRAVENOUS | Status: AC
  Filled 2021-08-20: qty 20

## 2021-08-20 MED ORDER — CEFAZOLIN SODIUM-DEXTROSE 2-4 GM/100ML-% IV SOLN
INTRAVENOUS | Status: AC
  Filled 2021-08-20: qty 100

## 2021-08-20 MED ORDER — SODIUM CHLORIDE FLUSH 0.9 % IV SOLN
INTRAVENOUS | Status: AC
  Administered 2021-08-20: 3 mL via INTRAVENOUS
  Filled 2021-08-20: qty 3

## 2021-08-20 MED ORDER — PROPOFOL 10 MG/ML IV BOLUS
INTRAVENOUS | Status: AC
  Filled 2021-08-20: qty 40

## 2021-08-20 MED ORDER — FENTANYL CITRATE (PF) 100 MCG/2ML IJ SOLN
INTRAMUSCULAR | Status: AC
  Administered 2021-08-20: 50 ug via INTRAVENOUS
  Filled 2021-08-20: qty 2

## 2021-08-20 MED ORDER — BUPIVACAINE-EPINEPHRINE (PF) 0.5% -1:200000 IJ SOLN
INTRAMUSCULAR | Status: AC
  Filled 2021-08-20: qty 30

## 2021-08-20 MED ORDER — LIDOCAINE HCL (CARDIAC) PF 100 MG/5ML IV SOSY
PREFILLED_SYRINGE | INTRAVENOUS | Status: DC | PRN
  Administered 2021-08-20: 80 mg via INTRAVENOUS

## 2021-08-20 MED ORDER — CHLORHEXIDINE GLUCONATE 0.12 % MT SOLN
OROMUCOSAL | Status: AC
  Administered 2021-08-20: 15 mL via OROMUCOSAL
  Filled 2021-08-20: qty 15

## 2021-08-20 MED ORDER — OXYCODONE HCL 5 MG PO TABS
5.0000 mg | ORAL_TABLET | ORAL | Status: DC | PRN
  Administered 2021-08-20 – 2021-08-22 (×5): 5 mg via ORAL

## 2021-08-20 MED ORDER — LIDOCAINE HCL (PF) 2 % IJ SOLN
INTRAMUSCULAR | Status: AC
  Filled 2021-08-20: qty 5

## 2021-08-20 MED ORDER — SODIUM CHLORIDE 0.9% FLUSH: 3.0000 mL | INTRAVENOUS | Status: DC | PRN

## 2021-08-20 MED ORDER — DEXAMETHASONE SODIUM PHOSPHATE 10 MG/ML IJ SOLN
INTRAMUSCULAR | Status: AC
  Filled 2021-08-20: qty 1

## 2021-08-20 MED ORDER — SUCCINYLCHOLINE CHLORIDE 200 MG/10ML IV SOSY
PREFILLED_SYRINGE | INTRAVENOUS | Status: AC
  Filled 2021-08-20: qty 10

## 2021-08-20 MED ORDER — ORAL CARE MOUTH RINSE: 15.0000 mL | Freq: Once | OROMUCOSAL | Status: AC

## 2021-08-20 MED ORDER — METHOCARBAMOL 1000 MG/10ML IJ SOLN
500.0000 mg | Freq: Four times a day (QID) | INTRAVENOUS | Status: DC | PRN
  Administered 2021-08-20: 500 mg via INTRAVENOUS
  Filled 2021-08-20: qty 500

## 2021-08-20 MED ORDER — PROPOFOL 500 MG/50ML IV EMUL
INTRAVENOUS | Status: DC | PRN
  Administered 2021-08-20: 100 ug/kg/min via INTRAVENOUS

## 2021-08-20 MED ORDER — SODIUM CHLORIDE 0.9 % IV SOLN: 250.0000 mL | INTRAVENOUS | Status: DC

## 2021-08-20 MED ORDER — FENTANYL CITRATE (PF) 100 MCG/2ML IJ SOLN
25.0000 ug | INTRAMUSCULAR | Status: DC | PRN
  Administered 2021-08-20: 50 ug via INTRAVENOUS
  Administered 2021-08-20: 25 ug via INTRAVENOUS
  Administered 2021-08-20: 50 ug via INTRAVENOUS
  Administered 2021-08-20: 25 ug via INTRAVENOUS

## 2021-08-20 MED ORDER — SODIUM CHLORIDE 0.9 % IV SOLN: INTRAVENOUS | Status: DC

## 2021-08-20 MED ORDER — GLYCOPYRROLATE 0.2 MG/ML IJ SOLN
INTRAMUSCULAR | Status: AC
  Filled 2021-08-20: qty 1

## 2021-08-20 MED ORDER — FAMOTIDINE 20 MG PO TABS
ORAL_TABLET | ORAL | Status: AC
  Administered 2021-08-20: 20 mg via ORAL
  Filled 2021-08-20: qty 1

## 2021-08-20 MED ORDER — KETOROLAC TROMETHAMINE 15 MG/ML IJ SOLN
15.0000 mg | Freq: Four times a day (QID) | INTRAMUSCULAR | Status: AC
  Administered 2021-08-20 – 2021-08-21 (×2): 15 mg via INTRAVENOUS

## 2021-08-20 MED ORDER — ACETAMINOPHEN 500 MG PO TABS
ORAL_TABLET | ORAL | Status: AC
  Administered 2021-08-20: 1000 mg via ORAL
  Filled 2021-08-20: qty 2

## 2021-08-20 MED ORDER — BISACODYL 5 MG PO TBEC
5.0000 mg | DELAYED_RELEASE_TABLET | Freq: Every day | ORAL | Status: DC | PRN
  Administered 2021-08-22: 5 mg via ORAL
  Filled 2021-08-20 (×2): qty 1

## 2021-08-20 MED ORDER — KETOROLAC TROMETHAMINE 15 MG/ML IJ SOLN
INTRAMUSCULAR | Status: AC
  Filled 2021-08-20: qty 1

## 2021-08-20 MED ORDER — SUCCINYLCHOLINE CHLORIDE 200 MG/10ML IV SOSY
PREFILLED_SYRINGE | INTRAVENOUS | Status: DC | PRN
  Administered 2021-08-20: 100 mg via INTRAVENOUS

## 2021-08-20 MED ORDER — DIPHENHYDRAMINE HCL 25 MG PO CAPS
ORAL_CAPSULE | ORAL | Status: AC
  Administered 2021-08-20: 25 mg via ORAL
  Filled 2021-08-20: qty 1

## 2021-08-20 MED ORDER — PHENYLEPHRINE HCL (PRESSORS) 10 MG/ML IV SOLN
INTRAVENOUS | Status: AC
  Filled 2021-08-20: qty 1

## 2021-08-20 SURGICAL SUPPLY — 73 items
BIT DRILL CANOPY 2.2 (BIT) ×1 IMPLANT
BLADE CLIPPER SURG (BLADE) ×1 IMPLANT
BLADE CLIPPER SURG NEURO (BLADE) ×1 IMPLANT
BULB RESERV EVAC DRAIN JP 100C (MISCELLANEOUS) ×1 IMPLANT
BUR NEURO DRILL SOFT 3.0X3.8M (BURR) ×2 IMPLANT
CHLORAPREP W/TINT 26 (MISCELLANEOUS) ×4 IMPLANT
COUNTER NEEDLE 20/40 LG (NEEDLE) ×2 IMPLANT
DERMABOND ADVANCED (GAUZE/BANDAGES/DRESSINGS) ×1
DERMABOND ADVANCED .7 DNX12 (GAUZE/BANDAGES/DRESSINGS) ×1 IMPLANT
DRAIN CHANNEL JP 10F RND 20C F (MISCELLANEOUS) ×1 IMPLANT
DRAPE C ARM PK CFD 31 SPINE (DRAPES) ×2 IMPLANT
DRAPE LAPAROTOMY 100X77 ABD (DRAPES) ×2 IMPLANT
DRAPE MICROSCOPE SPINE 48X150 (DRAPES) ×1 IMPLANT
DRAPE SURG 17X11 SM STRL (DRAPES) ×5 IMPLANT
DRSG OPSITE POSTOP 3X4 (GAUZE/BANDAGES/DRESSINGS) ×1 IMPLANT
DRSG OPSITE POSTOP 4X8 (GAUZE/BANDAGES/DRESSINGS) ×1 IMPLANT
DRSG TEGADERM 4X4.75 (GAUZE/BANDAGES/DRESSINGS) ×1 IMPLANT
Drill Bit, 2.2 ×1 IMPLANT
ELECT CAUTERY BLADE TIP 2.5 (TIP)
ELECTRODE CAUTERY BLDE TIP 2.5 (TIP) IMPLANT
FEE INTRAOP CADWELL SUPPLY NCS (MISCELLANEOUS) ×1 IMPLANT
FEE INTRAOP MONITOR IMPULS NCS (MISCELLANEOUS) IMPLANT
GAUZE 4X4 16PLY ~~LOC~~+RFID DBL (SPONGE) ×2 IMPLANT
GAUZE XEROFORM 4X4 STRL (GAUZE/BANDAGES/DRESSINGS) ×1 IMPLANT
GLOVE SURG SYN 6.5 ES PF (GLOVE) ×2 IMPLANT
GLOVE SURG SYN 6.5 PF PI (GLOVE) ×1 IMPLANT
GLOVE SURG SYN 8.5  E (GLOVE) ×3
GLOVE SURG SYN 8.5 E (GLOVE) ×3 IMPLANT
GLOVE SURG SYN 8.5 PF PI (GLOVE) ×3 IMPLANT
GLOVE SURG UNDER POLY LF SZ6.5 (GLOVE) ×2 IMPLANT
GLOVE SURG UNDER POLY LF SZ8.5 (GLOVE) ×2 IMPLANT
GOWN SRG LRG LVL 4 IMPRV REINF (GOWNS) ×1 IMPLANT
GOWN SRG XL LVL 3 NONREINFORCE (GOWNS) ×1 IMPLANT
GOWN STRL NON-REIN TWL XL LVL3 (GOWNS) ×1
GOWN STRL REIN LRG LVL4 (GOWNS) ×1
GRADUATE 1200CC STRL 31836 (MISCELLANEOUS) ×2 IMPLANT
HEMOVAC 400CC 10FR (MISCELLANEOUS) IMPLANT
INTRAOP CADWELL SUPPLY FEE NCS (MISCELLANEOUS) ×1
INTRAOP DISP SUPPLY FEE NCS (MISCELLANEOUS) ×1
INTRAOP MONITOR FEE IMPULS NCS (MISCELLANEOUS) ×1
INTRAOP MONITOR FEE IMPULSE (MISCELLANEOUS) ×1
KIT TURNOVER KIT A (KITS) ×2 IMPLANT
MANIFOLD NEPTUNE II (INSTRUMENTS) ×2 IMPLANT
MARKER SKIN DUAL TIP RULER LAB (MISCELLANEOUS) ×4 IMPLANT
NDL SAFETY ECLIPSE 18X1.5 (NEEDLE) ×1 IMPLANT
NEEDLE HYPO 18GX1.5 SHARP (NEEDLE) ×1
NEEDLE HYPO 22GX1.5 SAFETY (NEEDLE) ×2 IMPLANT
NS IRRIG 1000ML POUR BTL (IV SOLUTION) ×1 IMPLANT
NS IRRIG 500ML POUR BTL (IV SOLUTION) ×1 IMPLANT
PACK LAMINECTOMY NEURO (CUSTOM PROCEDURE TRAY) ×2 IMPLANT
PAD ARMBOARD 7.5X6 YLW CONV (MISCELLANEOUS) ×5 IMPLANT
PIN MAYFIELD SKULL DISP (PIN) ×2 IMPLANT
PLATE BN 5XSHLF INLN SPNE CNP (Plate) IMPLANT
PLATE CANOPY SHELF INLINE 5 (Plate) ×2 IMPLANT
PLATE CANOPY SHELF INLINE 7 (Plate) ×2 IMPLANT
SCREW CANOPY 2.6X6 (Screw) ×1 IMPLANT
SCREW NONLOCK HEX LP S 3.5X52 (Screw) ×14 IMPLANT
SCREW SELF DRILL RELIEVE 6 (Screw) ×1 IMPLANT
SPONGE GAUZE 2X2 8PLY STRL LF (GAUZE/BANDAGES/DRESSINGS) ×1 IMPLANT
STAPLER SKIN PROX 35W (STAPLE) ×4 IMPLANT
SURGIFLO W/THROMBIN 8M KIT (HEMOSTASIS) ×2 IMPLANT
SUT BONE WAX W31G (SUTURE) ×1 IMPLANT
SUT ETHILON 3-0 FS-10 30 BLK (SUTURE) ×2
SUT V-LOC 90 ABS DVC 3-0 CL (SUTURE) ×2 IMPLANT
SUT VIC AB 0 CT1 27 (SUTURE) ×1
SUT VIC AB 0 CT1 27XCR 8 STRN (SUTURE) IMPLANT
SUT VIC AB 2-0 CT1 18 (SUTURE) ×3 IMPLANT
SUTURE EHLN 3-0 FS-10 30 BLK (SUTURE) IMPLANT
TAPE CLOTH 3X10 WHT NS LF (GAUZE/BANDAGES/DRESSINGS) ×4 IMPLANT
TOWEL OR 17X26 4PK STRL BLUE (TOWEL DISPOSABLE) ×8 IMPLANT
TRAY FOLEY MTR SLVR 16FR STAT (SET/KITS/TRAYS/PACK) IMPLANT
TUBING CONNECTING 10 (TUBING) ×2 IMPLANT
WATER STERILE IRR 500ML POUR (IV SOLUTION) ×1 IMPLANT

## 2021-08-20 NOTE — Anesthesia Procedure Notes (Signed)
Procedure Name: Intubation Date/Time: 08/20/2021 7:30 AM Performed by: Malva Cogan, CRNA Pre-anesthesia Checklist: Patient identified, Patient being monitored, Timeout performed, Emergency Drugs available and Suction available Patient Re-evaluated:Patient Re-evaluated prior to induction Oxygen Delivery Method: Circle system utilized Preoxygenation: Pre-oxygenation with 100% oxygen Induction Type: IV induction Ventilation: Two handed mask ventilation required Laryngoscope Size: McGraph and 4 Grade View: Grade I Tube type: Oral Tube size: 7.0 mm Number of attempts: 1 Airway Equipment and Method: Stylet Placement Confirmation: ETT inserted through vocal cords under direct vision, positive ETCO2 and breath sounds checked- equal and bilateral Secured at: 22 cm Tube secured with: Tape Dental Injury: Teeth and Oropharynx as per pre-operative assessment

## 2021-08-20 NOTE — Progress Notes (Signed)
PHARMACY -  BRIEF ANTIBIOTIC NOTE   Pharmacy has received consult(s) for Cefazolin from an OR provider.  The patient's profile has been reviewed for ht/wt/allergies/indication/available labs.    One time order(s) placed for Cefazolin 2 gm.  Further antibiotics/pharmacy consults should be ordered by admitting physician if indicated.                       Thank you, Otelia Sergeant, PharmD, [facility] 08/20/2021 6:14 AM

## 2021-08-20 NOTE — H&P (Signed)
History of Present Illness: 08/20/2021 Ryan Rose presents today with continued symptoms of cervical myelopathy.   06/26/2021 Ryan Rose is here today with a chief complaint of bilateral hand numbness, weakness in the bilateral legs, trouble with walking/gait. He has been having problems for the past 8 months he feels that he is getting worse. He is having trouble with his hands. He has tingling and numbness in his hands. He is having trouble with his dexterity. He has started using a cane because his balance is getting so much worse.  Prolonged standing makes him very unsteady. He has had multiple falls.  Bowel/Bladder Dysfunction: none  Conservative measures: Has had upper and lower extremity EMGs by Dr. Manuella Ghazi (07/10/20 and 07/15/20)  Physical therapy: has not participated in Multimodal medical therapy including regular antiinflammatories: none Injections: has not had any epidural steroid injections  Past Surgery: none  Ryan Rose has clear symptoms of cervical myelopathy.  The symptoms are causing a significant impact on the patient's life.   Review of Systems:  A 10 point review of systems is negative, except for the pertinent positives and negatives detailed in the HPI.  Past Medical History: Past Medical History:  Diagnosis Date   Arthritis   Hypertension   Past Surgical History: Past Surgical History:  Procedure Laterality Date   FRACTURE SURGERY   SPLIT THICKNESS SKIN GRAFT    Allergies  Allergen Reactions   Sulfa Antibiotics Hives   Current Meds  Medication Sig   aspirin 81 MG chewable tablet Chew 81 mg by mouth daily as needed.     Social History: Social History   Tobacco Use   Smoking status: Every Day   Smokeless tobacco: Never  Substance Use Topics   Alcohol use: Yes   Drug use: Not Currently   Family Medical History: History reviewed. No pertinent family history.  Physical Examination:  Vitals:   08/20/21 0630  BP: (!)  142/100  Pulse: 83  Resp: 14  Temp: (!) 97 F (36.1 C)  SpO2: 100%   Heart sounds normal no MRG. Chest Clear to Auscultation Bilaterally.  General: Patient is well developed, well nourished, calm, collected, and in no apparent distress. Attention to examination is appropriate.  Psychiatric: Patient is non-anxious.  Head: Pupils equal, round, and reactive to light.  ENT: Oral mucosa appears well hydrated.  Neck: Supple. Full range of motion.  Respiratory: Patient is breathing without any difficulty.  Extremities: No edema.  Vascular: Palpable dorsal pedal pulses.  Skin: On exposed skin, there are no abnormal skin lesions.  NEUROLOGICAL:   Awake, alert, oriented to person, place, and time. Speech is clear and fluent. Fund of knowledge is appropriate.   Cranial Nerves: Pupils equal round and reactive to light. Facial tone is symmetric. Facial sensation is symmetric. Shoulder shrug is symmetric. Tongue protrusion is midline. There is no pronator drift.  Strength: Side Biceps Triceps Deltoid Interossei Grip Wrist Ext. Wrist Flex.  R 4+ 4+ 4+ 3 3 4- 4-  L 4+ 4+ 4+ 4- 4- 4 4   Side Iliopsoas Quads Hamstring PF DF EHL  R 5 5 5 5 5 5   L 5 5 5 5 5 5    Reflexes are 2+ and symmetric at the biceps, triceps, brachioradialis, patella and achilles. Hoffman's is present.  Clonus is not present. Toes are down-going.  Bilateral upper and lower extremity sensation is intact to light touch except hands and forearms, which are diminished.  Gait is wide-based. He requires a cane.  He cannot perform tandem gait. No evidence of dysmetria noted.  Medical Decision Making  Imaging: MRI C spine 05/20/2021  IMPRESSION:  Degenerative spondylosis at C3-4, C4-5, C5-6 and C6-7. Central canal  stenosis at C3-4, C4-5 and C5-6 with effacement of the subarachnoid  space and some cord deformity. Early abnormal T2 signal within the  cord at C3-4 and C4-5 likely indicate early compressive myelopathy.    Bilateral foraminal stenosis at C4-5, C5-6 and C6-7 that could  compress the exiting nerves.   Discogenic endplate edematous marrow changes at C4-5, C5-6 and C6-7  which could contribute to neck pain.   Electronically Signed    By: Nelson Chimes M.D.    On: 05/21/2021 12:01  I have personally reviewed the images and agree with the above interpretation.  Assessment and Plan: Ryan Rose is a pleasant 61 y.o. male with cervical myelopathy with progressive symptoms. He is gotten worse over the past several months. He has objective weakness. He has central stenosis from C3-6 with spinal cord deformity and early T2 signal.  We will proceed with C3-6 laminoplasty.  Meade Maw MD, [facility] Department of Neurosurgery

## 2021-08-20 NOTE — Op Note (Addendum)
Indications: [name] is a 61 yo male who presented with cervical myelopathy.  He had significant symptoms prompting surgical interventions.  Findings: cervical stenosis  Preoperative Diagnosis: Cervical myelopathy G95.9 Postoperative Diagnosis: same   EBL: 50 ml IVF: 700 ml Drains: 1 placed Disposition: Extubated and Stable to PACU Complications: none  No foley catheter was placed.   Preoperative Note:   Risks of surgery discussed include: infection, bleeding, stroke, coma, death, paralysis, CSF leak, nerve/spinal cord injury, numbness, tingling, weakness, complex regional pain syndrome, recurrent stenosis and/or disc herniation, vascular injury, development of instability, neck/back pain, need for further surgery, persistent symptoms, development of deformity, and the risks of anesthesia. The patient understood these risks and agreed to proceed.  Operative Note:   OPERATIVE PROCEDURE:  1. Posterior Cervical Laminoplasty C3-6 2. Use of flouroscopy   OPERATIVE PROCEDURE:  After induction of general anesthesia, the Mayfield was placed. The patient was then placed into the prone position on the standard table. A midline incision was then planned using fluoroscopy.  A timeout was performed, and antibiotics given.  Next, the posterior cervical region was prepped and draped in the usual sterile fashion. The incision was injected with local anesthetic, the opened sharply. A subperiosteal dissection was then carried out to expose the remaining posterior elements from C3 and C6, with careful attention paid to maintaining the facet capsules.  After satisfactory exposure had been obtained, the high speed drill was used to drill a partial thickness cut in the lamina from C3 to C6 on the left, and a full thickness cut from C3-6 on the right.  We then mobilized the lamina to expand the spinal canal.  We sized the space on the right, then placed laminoplasty plates at each level from C3-6.   Screws were placed into the lateral mass and lamina at each level to hold the laminoplasty plates in position.  After decompression was complete, final radiographs were taken.  The wound was copiously irrigated with bacitracin-containing solution and hemostasis was achieved.    A Hemovac drain was then placed in the wound deep to the fascia.   The wound was closed in a multilayer fashion using interrupted 0 and 2-0 Vicryl sutures.  The final skin edges were reapproximated using a 3-0 monocryl.  After closure, the patient was flipped supine and the Mayfield removed.  Patient was then handed back over to anesthesia.  All counts were correct at the conclusion of the procedure.  Neurological monitoring was used throughout, and there were no changes.  Manning Charity PA acted as an Designer, television/film set throughout the case.   Venetia Night MD

## 2021-08-20 NOTE — Transfer of Care (Signed)
Immediate Anesthesia Transfer of Care Note  Patient: Ryan Rose  Procedure(s) Performed: C3-6 LAMINOPLASTY (Neck)  Patient Location: PACU  Anesthesia Type:General  Level of Consciousness: awake, alert  and oriented  Airway & Oxygen Therapy: Patient Spontanous Breathing and Patient connected to nasal cannula oxygen  Post-op Assessment: Post -op Vital signs reviewed and stable and Patient moving all extremities X 4  Post vital signs: Reviewed and stable  Last Vitals:  Vitals Value Taken Time  BP    Temp    Pulse 88 08/20/21 0939  Resp 25 08/20/21 0939  SpO2 99 % 08/20/21 0939  Vitals shown include unvalidated device data.  Last Pain:  Vitals:   08/20/21 0630  TempSrc: Temporal  PainSc: 1          Complications: No notable events documented.

## 2021-08-21 MED ORDER — ACETAMINOPHEN 500 MG PO TABS
ORAL_TABLET | ORAL | Status: AC
  Administered 2021-08-21: 1000 mg via ORAL
  Filled 2021-08-21: qty 2

## 2021-08-21 MED ORDER — OXYCODONE HCL 5 MG PO TABS
ORAL_TABLET | ORAL | Status: AC
  Administered 2021-08-21: 10 mg via ORAL
  Filled 2021-08-21: qty 2

## 2021-08-21 MED ORDER — KETOROLAC TROMETHAMINE 15 MG/ML IJ SOLN
INTRAMUSCULAR | Status: AC
  Filled 2021-08-21: qty 1

## 2021-08-21 MED ORDER — OXYCODONE HCL 5 MG PO TABS
ORAL_TABLET | ORAL | Status: AC
  Filled 2021-08-21: qty 1

## 2021-08-21 MED ORDER — METHOCARBAMOL 500 MG PO TABS
ORAL_TABLET | ORAL | Status: AC
  Administered 2021-08-21: 500 mg via ORAL
  Filled 2021-08-21: qty 1

## 2021-08-21 MED ORDER — SODIUM CHLORIDE FLUSH 0.9 % IV SOLN
INTRAVENOUS | Status: AC
  Administered 2021-08-21: 3 mL via INTRAVENOUS
  Filled 2021-08-21: qty 20

## 2021-08-21 MED ORDER — OXYCODONE HCL 5 MG PO TABS
ORAL_TABLET | ORAL | Status: AC
  Administered 2021-08-21: 10 mg via ORAL
  Filled 2021-08-21: qty 1

## 2021-08-21 MED ORDER — ENOXAPARIN SODIUM 40 MG/0.4ML IJ SOSY: 40.0000 mg | PREFILLED_SYRINGE | INTRAMUSCULAR | Status: DC

## 2021-08-21 MED ORDER — ENOXAPARIN SODIUM 40 MG/0.4ML IJ SOSY
PREFILLED_SYRINGE | INTRAMUSCULAR | Status: AC
  Administered 2021-08-21: 40 mg via SUBCUTANEOUS
  Filled 2021-08-21: qty 0.4

## 2021-08-21 MED ORDER — ACETAMINOPHEN 500 MG PO TABS
ORAL_TABLET | ORAL | Status: AC
  Filled 2021-08-21: qty 2

## 2021-08-21 NOTE — Evaluation (Signed)
Occupational Therapy Evaluation Patient Details Name: Ryan Rose MRN: [id number] DOB: 1961-07-11 Today's Date: 08/21/2021   History of Present Illness 61 y/o male  s/p C3-6 laminoplasty for cervical myelopathy.  Has been having progressive weakness and numbness in b/l U&LEs.   Clinical Impression    Patient presenting with decreased ind in self care, balance, functional mobility/transfers, endurance, and safety awareness. Patient reports being mod I with use of quad cane at baseline. Pt with several falls every week and with generalized weakness and decreased coordination in B UEs. Pt did report needing some assistance to get into/out of tub when bathing and to fasten buttons.  Patient currently functioning at min guard - min A level with use of RW.  Patient will benefit from acute OT to increase overall independence in the areas of ADLs, functional mobility, and safety awareness in order to safely discharge home with family.    Recommendations for follow up therapy are one component of a multi-disciplinary discharge planning process, led by the attending physician.  Recommendations may be updated based on patient status, additional functional criteria and insurance authorization.   Follow Up Recommendations  Home health OT    Assistance Recommended at Discharge Intermittent Supervision/Assistance  Patient can return home with the following A little help with walking and/or transfers;A little help with bathing/dressing/bathroom    Functional Status Assessment  Patient has had a recent decline in their functional status and demonstrates the ability to make significant improvements in function in a reasonable and predictable amount of time.  Equipment Recommendations  BSC/3in1;Tub/shower bench       Precautions / Restrictions Precautions Precautions: Fall Precaution Comments: no brace needed, no formal cervical precautions Restrictions Weight Bearing Restrictions: No       Mobility Bed Mobility Overal bed mobility: Modified Independent             General bed mobility comments: increased time but no physical assistance    Transfers Overall transfer level: Needs assistance Equipment used: Rolling walker (2 wheels) Transfers: Sit to/from Stand Sit to Stand: Min guard           General transfer comment: cuing to insure appropriate positioning and UE use, slow to rise but did not need direct assist      Balance Overall balance assessment: Needs assistance Sitting-balance support: Single extremity supported Sitting balance-Leahy Scale: Good     Standing balance support: Bilateral upper extremity supported Standing balance-Leahy Scale: Fair Standing balance comment: definite need of UEs/AD to maintain upright.  Consistent low grade buckling that he was able to self arrest                           ADL either performed or assessed with clinical judgement   ADL Overall ADL's : Needs assistance/impaired                                       General ADL Comments: Pt sitting on EOB and demonstrates figure four position for LB dressing. OT did discuss some cervical precautions just for comfort. Functional transfers with min guard and use of RW     Vision Patient Visual Report: No change from baseline              Pertinent Vitals/Pain Pain Assessment: 0-10 Pain Score: 2  Pain Location: minimal neck pain, does increase with "prolonged" ambulation/looking down  Pain Descriptors / Indicators: Discomfort Pain Intervention(s): Limited activity within patient's tolerance;Repositioned;Monitored during session        Extremity/Trunk Assessment Upper Extremity Assessment Upper Extremity Assessment: Generalized weakness   Lower Extremity Assessment Lower Extremity Assessment: Generalized weakness       Communication Communication Communication: No difficulties   Cognition Arousal/Alertness:  Awake/alert Behavior During Therapy: WFL for tasks assessed/performed Overall Cognitive Status: Within Functional Limits for tasks assessed                                                  Home Living Family/patient expects to be discharged to:: Private residence Living Arrangements: Spouse/significant other Available Help at Discharge: Family;Available PRN/intermittently Type of Home: House Home Access: Other (comment) (small treshold)     Home Layout: One level     Bathroom Shower/Tub: Tub/shower unit         Home Equipment: Agricultural consultant (2 wheels);Cane - quad          Prior Functioning/Environment Prior Level of Function : History of Falls (last six months)             Mobility Comments: pt reports several falls each week with quad cane ADLs Comments: Pt reports being mod I with self care tasks but needing to get into/out of tub because unable to stand in shower but then needing assist to get out of tub        OT Problem List: Decreased strength;Decreased activity tolerance;Impaired balance (sitting and/or standing);Decreased safety awareness;Pain;Decreased cognition;Decreased knowledge of use of DME or AE;Decreased coordination      OT Treatment/Interventions: Self-care/ADL training;Therapeutic exercise;Energy conservation;DME and/or AE instruction;Manual therapy;Therapeutic activities;Balance training;Patient/family education    OT Goals(Current goals can be found in the care plan section) Acute Rehab OT Goals Patient Stated Goal: to go home OT Goal Formulation: With patient Time For Goal Achievement: 09/04/21 Potential to Achieve Goals: Good ADL Goals Pt Will Perform Grooming: with modified independence;standing Pt Will Perform Lower Body Dressing: with modified independence;sit to/from stand Pt Will Transfer to Toilet: with modified independence;ambulating Pt Will Perform Toileting - Clothing Manipulation and hygiene: with modified  independence;sit to/from stand  OT Frequency: Min 2X/week       AM-PAC OT "6 Clicks" Daily Activity     Outcome Measure Help from another person eating meals?: A Little Help from another person taking care of personal grooming?: A Little Help from another person toileting, which includes using toliet, bedpan, or urinal?: A Little Help from another person bathing (including washing, rinsing, drying)?: A Little Help from another person to put on and taking off regular upper body clothing?: A Little Help from another person to put on and taking off regular lower body clothing?: A Little 6 Click Score: 18   End of Session Equipment Utilized During Treatment: Rolling walker (2 wheels) Nurse Communication: Mobility status  Activity Tolerance: Patient tolerated treatment well Patient left: in bed;with call bell/phone within reach  OT Visit Diagnosis: Unsteadiness on feet (R26.81);Repeated falls (R29.6);Muscle weakness (generalized) (M62.81)                Time: 9373-4287 OT Time Calculation (min): 20 min Charges:  OT General Charges $OT Visit: 1 Visit OT Evaluation $OT Eval Moderate Complexity: 1 Mod OT Treatments $Self Care/Home Management : 8-22 mins Jackquline Denmark, MS, OTR/L , CBIS ascom [phone]  08/21/21, 1:54 PM

## 2021-08-21 NOTE — Evaluation (Signed)
Physical Therapy Evaluation Patient Details Name: Ryan Rose MRN: [id number] DOB: Apr 05, 1961 Today's Date: 08/21/2021  History of Present Illness  61 y/o male  s/p C3-6 laminoplasty for cervical myelopathy.  Has been having progressive weakness and numbness in b/l U&LEs.  Clinical Impression  Pt reports that UEs especially have better sensation and strength than pre surgery though there is still clearly some weakness and coordination deficits.  Pt was able to ambulate 100 ft with heavy reliance on the walker and consistent low grad buckling as well as difficulty with any consistency with foot placement/cadence.  Pt did not have any overt LOBs but with definite unsteadiness and PT stressed that he must use a walker (not his cane) for a while until he has more consistent cadence/strength in LEs.  Recommending HHPT at discharge per surgeon.     Recommendations for follow up therapy are one component of a multi-disciplinary discharge planning process, led by the attending physician.  Recommendations may be updated based on patient status, additional functional criteria and insurance authorization.  Follow Up Recommendations Home health PT    Assistance Recommended at Discharge Intermittent Supervision/Assistance  Patient can return home with the following  Assistance with cooking/housework;Assist for transportation    Equipment Recommendations BSC/3in1  Recommendations for Other Services       Functional Status Assessment Patient has had a recent decline in their functional status and demonstrates the ability to make significant improvements in function in a reasonable and predictable amount of time.     Precautions / Restrictions Precautions Precautions: Fall Precaution Comments: no brace needed, no formal cervical precautions Restrictions Weight Bearing Restrictions: No      Mobility  Bed Mobility Overal bed mobility: Modified Independent             General bed  mobility comments: slow to rise to EOB, reliant on UEs but able to get to sitting t/o direct assist    Transfers Overall transfer level: Needs assistance Equipment used: Rolling walker (2 wheels) Transfers: Sit to/from Stand Sit to Stand: Min guard           General transfer comment: cuing to insure appropriate positioning and UE use, slow to rise but did not need direct assist    Ambulation/Gait Ambulation/Gait assistance: Min guard;Min assist Gait Distance (Feet): 100 Feet Assistive device: Rolling walker (2 wheels)         General Gait Details: Pt with very slow and guarded gait.  No overt LOBs but consistent low grade buckling in b/l knees that he is able to self arrest with walker/UEs.  Pt needing consistent reminders to keep looking up/maintain neutral while insuring awareness of foot placement and quad engagement  Stairs            Wheelchair Mobility    Modified Rankin (Stroke Patients Only)       Balance Overall balance assessment: Needs assistance Sitting-balance support: Single extremity supported Sitting balance-Leahy Scale: Good     Standing balance support: Bilateral upper extremity supported Standing balance-Leahy Scale: Fair Standing balance comment: definite need of UEs/AD to maintain upright.  Consistent low grade buckling that he was able to self arrest                             Pertinent Vitals/Pain Pain Assessment: 0-10 Pain Score: 2  Pain Location: minimal neck pain, does increase with "prolonged" ambulation/looking down    Home Living Family/patient expects to be discharged  to:: Private residence Living Arrangements: Spouse/significant other Available Help at Discharge: Family;Available PRN/intermittently (wife works but can be off as needed, sisters live within a few miles and able to assist PRN) Type of Home: House Home Access:  (small threshold)       Home Layout: One level Home Equipment: Agricultural consultant (2  wheels);Cane - quad      Prior Function Prior Level of Function : History of Falls (last six months) (pt reports he is falling at least 1x/wk recently even with cane)                     Hand Dominance        Extremity/Trunk Assessment   Upper Extremity Assessment Upper Extremity Assessment: Generalized weakness (Pt with functional strength but with decreased coordination, quality of movement and some reported numbness  (reports all these better than presurgery))    Lower Extremity Assessment Lower Extremity Assessment: Generalized weakness (grossly 3+ to 4-/5 t/o with L weaker than R.)       Communication   Communication: No difficulties  Cognition Arousal/Alertness: Awake/alert Behavior During Therapy: WFL for tasks assessed/performed Overall Cognitive Status: Within Functional Limits for tasks assessed                                          General Comments      Exercises     Assessment/Plan    PT Assessment Patient needs continued PT services  PT Problem List Decreased strength;Decreased range of motion;Decreased activity tolerance;Decreased balance;Decreased mobility;Decreased safety awareness;Decreased knowledge of use of DME;Decreased coordination;Impaired sensation       PT Treatment Interventions DME instruction;Gait training;Stair training;Functional mobility training;Therapeutic activities;Therapeutic exercise;Balance training;Patient/family education;Neuromuscular re-education    PT Goals (Current goals can be found in the Care Plan section)  Acute Rehab PT Goals Patient Stated Goal: go home PT Goal Formulation: With patient Time For Goal Achievement: 09/04/21 Potential to Achieve Goals: Good    Frequency 7X/week     Co-evaluation               AM-PAC PT "6 Clicks" Mobility  Outcome Measure Help needed turning from your back to your side while in a flat bed without using bedrails?: None Help needed moving from  lying on your back to sitting on the side of a flat bed without using bedrails?: None Help needed moving to and from a bed to a chair (including a wheelchair)?: A Little Help needed standing up from a chair using your arms (e.g., wheelchair or bedside chair)?: A Little Help needed to walk in hospital room?: A Little Help needed climbing 3-5 steps with a railing? : A Little 6 Click Score: 20    End of Session Equipment Utilized During Treatment: Gait belt Activity Tolerance: Patient limited by fatigue;Patient tolerated treatment well Patient left: with chair alarm set;with nursing/sitter in room Nurse Communication: Mobility status PT Visit Diagnosis: Muscle weakness (generalized) (M62.81);Difficulty in walking, not elsewhere classified (R26.2);Unsteadiness on feet (R26.81);History of falling (Z91.81)    Time: 2440-1027 PT Time Calculation (min) (ACUTE ONLY): 38 min   Charges:   PT Evaluation $PT Eval Low Complexity: 1 Low PT Treatments $Gait Training: 8-22 mins        Malachi Pro, DPT 08/21/2021, 9:58 AM

## 2021-08-21 NOTE — Consult Note (Signed)
° °  [facility] Inpatient Consult   08/21/2021  Ryan Rose June 20, 1961 [id number]   [facility] [[facility]]  Accountable Care Organization [ACO] Patient: [name] plan  *Patient is at [facility]  Primary Care Provider:  Sherron Monday, MD, [facility]   Patient is to be assigned to a [facility] RN Care Management for needs for  telephonic disease management and/or community resource support in the plan.  Brief review from PT/OT evaluations for post hospital follow up needs.     Plan: Patient will be assigned [facility] RN Care Coordinator.   For additional questions or referrals please contact:   Charlesetta Shanks, RN BSN CCM [facility]  [phone] business mobile phone Toll free office [phone]  Fax number: [phone] [email or web address]

## 2021-08-21 NOTE — Progress Notes (Signed)
° °   Attending Progress Note  History: Ryan Rose is here s/p C3-6 laminoplasty for cervical myelopathy.   POD1: NAEO. Some neck soreness. Reports improved hand numbness.   Physical Exam: Vitals:   08/20/21 2354 08/21/21 0448  BP: (!) 163/92 (!) 131/91  Pulse: 100 86  Resp: 16 18  Temp: (!) 97.5 F (36.4 C) (!) 97.3 F (36.3 C)  SpO2: 96% 94%    AA Ox3 CNI 4+ throughout BUE except 3 in right and 4- in left IO Strength:5/5 throughout BLE HV output 120  Data:  No results for input(s): NA, K, CL, CO2, BUN, CREATININE, LABGLOM, GLUCOSE, CALCIUM in the last 168 hours. No results for input(s): AST, ALT, ALKPHOS in the last 168 hours.  Invalid input(s): TBILI   No results for input(s): WBC, HGB, HCT, PLT in the last 168 hours. No results for input(s): APTT, INR in the last 168 hours.       Other tests/results: none  Assessment/Plan:  Ryan Rose is a 61 y.o s/p C3-6 laminectomy   - mobilize - pain control - DVT prophylaxis - PTOT  Manning Charity PA-C Department of Neurosurgery

## 2021-08-22 ENCOUNTER — Other Ambulatory Visit: Payer: Self-pay

## 2021-08-22 ENCOUNTER — Other Ambulatory Visit (HOSPITAL_COMMUNITY): Payer: Self-pay

## 2021-08-22 MED ORDER — SODIUM CHLORIDE FLUSH 0.9 % IV SOLN
INTRAVENOUS | Status: AC
  Filled 2021-08-22: qty 3

## 2021-08-22 MED ORDER — SENNA 8.6 MG PO TABS
1.0000 | ORAL_TABLET | Freq: Two times a day (BID) | ORAL | 0 refills | Status: DC
  Filled 2021-08-22: qty 120, 60d supply, fill #0

## 2021-08-22 MED ORDER — METHOCARBAMOL 500 MG PO TABS
ORAL_TABLET | ORAL | Status: AC
  Filled 2021-08-22: qty 1

## 2021-08-22 MED ORDER — METHOCARBAMOL 500 MG PO TABS
500.0000 mg | ORAL_TABLET | Freq: Four times a day (QID) | ORAL | 0 refills | Status: DC | PRN
  Filled 2021-08-22 (×2): qty 120, 30d supply, fill #0

## 2021-08-22 MED ORDER — OXYCODONE HCL 5 MG PO TABS
ORAL_TABLET | ORAL | Status: AC
  Administered 2021-08-22: 10 mg via ORAL
  Filled 2021-08-22: qty 2

## 2021-08-22 MED ORDER — ENOXAPARIN SODIUM 40 MG/0.4ML IJ SOSY
PREFILLED_SYRINGE | INTRAMUSCULAR | Status: AC
  Administered 2021-08-22: 40 mg via SUBCUTANEOUS
  Filled 2021-08-22: qty 0.4

## 2021-08-22 MED ORDER — METHOCARBAMOL 1000 MG/10ML IJ SOLN: 500.0000 mg | Freq: Four times a day (QID) | INTRAVENOUS | 0 refills | Status: DC | PRN

## 2021-08-22 MED ORDER — OXYCODONE HCL 5 MG PO TABS
ORAL_TABLET | ORAL | Status: AC
  Filled 2021-08-22: qty 1

## 2021-08-22 MED ORDER — METHOCARBAMOL 500 MG PO TABS
ORAL_TABLET | ORAL | Status: AC
  Administered 2021-08-22: 500 mg via ORAL
  Filled 2021-08-22: qty 1

## 2021-08-22 MED ORDER — SENNA 8.6 MG PO TABS
1.0000 | ORAL_TABLET | Freq: Two times a day (BID) | ORAL | 0 refills | Status: DC
  Filled 2021-08-22: qty 60, 30d supply, fill #0

## 2021-08-22 MED ORDER — OXYCODONE HCL 5 MG PO TABS
5.0000 mg | ORAL_TABLET | ORAL | 0 refills | Status: AC | PRN
  Filled 2021-08-22: qty 30, 5d supply, fill #0

## 2021-08-22 MED ORDER — OXYCODONE HCL 5 MG PO TABS
5.0000 mg | ORAL_TABLET | ORAL | 0 refills | Status: DC | PRN
  Filled 2021-08-22: qty 30, 5d supply, fill #0

## 2021-08-22 NOTE — TOC Progression Note (Signed)
Transition of Care [facility]) - Progression Note    Patient Details  Name: TRENNEN MASTROMARINO MRN: [id number] Date of Birth: 10-16-1960  Transition of Care [facility]) CM/SW [place], RN Phone Number: 08/22/2021, 9:17 AM  Clinical Narrative:   Patient lives at home with his wife, He has a cane and a Walker per PT notes, He will need a 3 in 1, It will be delivered to the bedside by [employer] prior to DC, I reached out to [facility] [place], Provided the referral for [facility] services,   Patients PCP [place] his medication at [place]       Expected Discharge Plan and Services           Expected Discharge Date: 08/22/21                                     Social Determinants of Health (SDOH) Interventions    Readmission Risk Interventions No flowsheet data found.

## 2021-08-22 NOTE — Progress Notes (Signed)
° °   Attending Progress Note  History: Ryan Rose is here s/p C3-6 laminoplasty for cervical myelopathy.   POD2: Drainage overnight from around HV site. Was reinforced by nursing staff   POD1: NAEO. Some neck soreness. Reports improved hand numbness.   Physical Exam: Vitals:   08/21/21 2000 08/22/21 0500  BP: (!) 147/94 (!) 144/88  Pulse: 89 88  Resp: 18 18  Temp: 99.1 F (37.3 C) 99 F (37.2 C)  SpO2: 94% 95%    AA Ox3 CNI 4+ throughout BUE except 4- in rbilateral IO Strength:5/5 throughout BLE HV output 0  Data:  No results for input(s): NA, K, CL, CO2, BUN, CREATININE, LABGLOM, GLUCOSE, CALCIUM in the last 168 hours. No results for input(s): AST, ALT, ALKPHOS in the last 168 hours.  Invalid input(s): TBILI   No results for input(s): WBC, HGB, HCT, PLT in the last 168 hours. No results for input(s): APTT, INR in the last 168 hours.       Other tests/results: none  Assessment/Plan:  Ryan Rose is a 62 y.o s/p C3-6 laminectomy   - mobilize - pain control - DVT prophylaxis - PTOT - dispo planning underway, pending SW assisting with setting up [facility]  Manning Charity PA-C Department of Neurosurgery

## 2021-08-22 NOTE — Progress Notes (Signed)
Physical Therapy Treatment Patient Details Name: Ryan Rose MRN: [id number] DOB: 1961/02/21 Today's Date: 08/22/2021   History of Present Illness 61 y/o male  s/p C3-6 laminoplasty for cervical myelopathy.  Has been having progressive weakness and numbness in b/l U&LEs.    PT Comments    Pt was laying in side lying upon arriving. He agrees to PT session and is cooperative throughout. Was able to adhere to proper cervical precautions throughout session without difficulty. He was able to exit bed, stand and ambulate with very little assistance. Pt is cleared from an acute PT standpoint to safely DC home with HHPT to follow. Recommend RW and BSC prior to DC.     Recommendations for follow up therapy are one component of a multi-disciplinary discharge planning process, led by the attending physician.  Recommendations may be updated based on patient status, additional functional criteria and insurance authorization.  Follow Up Recommendations  Home health PT     Assistance Recommended at Discharge Set up Supervision/Assistance  Patient can return home with the following Assistance with cooking/housework;Assist for transportation   Equipment Recommendations  Rolling walker (2 wheels);BSC/3in1       Precautions / Restrictions Precautions Precautions: Fall;Cervical (no brace) Precaution Booklet Issued: No Restrictions Weight Bearing Restrictions: No     Mobility  Bed Mobility Overal bed mobility: Modified Independent    Transfers Overall transfer level: Modified independent Equipment used: Rolling walker (2 wheels) Transfers: Sit to/from Stand Sit to Stand: Modified independent (Device/Increase time)   Ambulation/Gait Ambulation/Gait assistance: Supervision Gait Distance (Feet): 200 Feet Assistive device: Rolling walker (2 wheels) Gait Pattern/deviations: Step-through pattern Gait velocity: decreased     General Gait Details: pt was able to ambulate 200 ft with RW  without LOB. does continue to have some knee buckling however no physical assistance required or intervention required     Balance Overall balance assessment: Needs assistance Sitting-balance support: Feet supported Sitting balance-Leahy Scale: Good     Standing balance support: Bilateral upper extremity supported Standing balance-Leahy Scale: Fair         Cognition Arousal/Alertness: Awake/alert Behavior During Therapy: WFL for tasks assessed/performed Overall Cognitive Status: Within Functional Limits for tasks assessed      General Comments: Pt is A and O x 4               Pertinent Vitals/Pain Pain Assessment: 0-10 Pain Score: 1  Pain Location: minimal neck pain Pain Descriptors / Indicators: Discomfort Pain Intervention(s): Limited activity within patient's tolerance;Monitored during session;Premedicated before session;Repositioned     PT Goals (current goals can now be found in the care plan section) Acute Rehab PT Goals Patient Stated Goal: go home Progress towards PT goals: Progressing toward goals    Frequency    7X/week      PT Plan Current plan remains appropriate       AM-PAC PT "6 Clicks" Mobility   Outcome Measure  Help needed turning from your back to your side while in a flat bed without using bedrails?: None Help needed moving from lying on your back to sitting on the side of a flat bed without using bedrails?: None Help needed moving to and from a bed to a chair (including a wheelchair)?: A Little Help needed standing up from a chair using your arms (e.g., wheelchair or bedside chair)?: A Little Help needed to walk in hospital room?: A Little Help needed climbing 3-5 steps with a railing? : A Little 6 Click Score: 20  End of Session Equipment Utilized During Treatment: Gait belt Activity Tolerance: Patient tolerated treatment well Patient left: in bed;with call bell/phone within reach Nurse Communication: Mobility status PT Visit  Diagnosis: Muscle weakness (generalized) (M62.81);Difficulty in walking, not elsewhere classified (R26.2);Unsteadiness on feet (R26.81);History of falling (Z91.81)     Time: 8546-2703 PT Time Calculation (min) (ACUTE ONLY): 31 min  Charges:  $Gait Training: 8-22 mins $Therapeutic Activity: 8-22 mins                     Jetta Lout PTA 08/22/21, 8:22 AM

## 2021-08-22 NOTE — Discharge Summary (Signed)
Physician Discharge Summary  Patient ID: [name] MRN: [id number] DOB/AGE: [birth year] 61 y.o.  Admit date: 08/20/2021 Discharge date: 08/22/2021  Admission Diagnoses: Cervical Myelopathy  Discharge Diagnoses:  Principal Problem:   Cervical myelopathy [facility])   Discharged Condition: good  Hospital Course:  Ryan Rose is a 61 y.o s/p C3-6 laminoplasty. His interoperative course was uncomplicated and he was admitted for pain control and drain monitoring. Therapy was consulted and recommended home health therapy. His drain output was monitored and removed when it decreased to an acceptable level. He was discharged home on POD2 with medications for pain.   Consults: None  Significant Diagnostic Studies: none  Treatments: surgery: as above. Please see separately dictated operative report for further details.   Discharge Exam: Blood pressure (!) 135/93, pulse 89, temperature 97.7 F (36.5 C), temperature source Temporal, resp. rate 17, height 5' 8.5" (1.74 m), weight 74.8 kg, SpO2 94 %. CN II-XII grossly intact 4+ throughout BUE except 4- in rbilateral IO Strength:5/5 throughout BLE  Disposition: Discharge disposition: 01-Home or Self Care       Discharge Instructions     AMB Referral to Community Care Coordinaton   Complete by: As directed    PCP Sherron Monday, MD, [facility] does the [facility] [employer] Employee - support follow up   Please assign to [facility] RN Care Coordinator for complex care and disease management follow up calls and assess for further needs.  Questions please call:   Charlesetta Shanks, RN BSN CCM [facility]  [phone] business mobile phone Toll free office [phone]  Fax number: [phone] [email or web address]   Reason for Referral: [facility] Disease Management ([employer] payers)   Disease managment services needed: Nurse Case Manager   Diagnoses of: Other   Other  Diagnosis: cervical - OR   Expected date of contact: Emergent - 3 Days   Diet - low sodium heart healthy   Complete by: As directed    Incentive spirometry RT   Complete by: As directed    Remove dressing in 24 hours   Complete by: As directed       Allergies as of 08/22/2021       Reactions   Sulfa Antibiotics Hives        Medication List     STOP taking these medications    doxycycline 100 MG capsule Commonly known as: VIBRAMYCIN   Vitamin D (Ergocalciferol) 1.25 MG (50000 UNIT) Caps capsule Commonly known as: DRISDOL       TAKE these medications    aspirin 81 MG chewable tablet Chew 81 mg by mouth daily as needed.   methocarbamol 500 mg in dextrose 5 % 50 mL Inject 500 mg into the vein every 6 (six) hours as needed.   oxyCODONE 5 MG immediate release tablet Commonly known as: Roxicodone Take 1 tablet (5 mg total) by mouth every 4 (four) hours as needed for up to 5 days for severe pain.   senna 8.6 MG Tabs tablet Commonly known as: SENOKOT Take 1 tablet (8.6 mg total) by mouth 2 (two) times daily.        Follow-up Information     [name], [place]. Go on 09/02/2021.   Why: At 2:30 pm - For incision check and post-op f/u Contact information: [address] [place] [address] [phone]                 Signed: [name] 08/22/2021, 1:16 PM

## 2021-08-22 NOTE — Discharge Instructions (Signed)
NEUROSURGERY DISCHARGE INSTRUCTIONS  Admission diagnosis: Cervical myelopathy ([place]) [G95.9]  Operative procedure: C3-6 laminoplasty  What to do after you leave the hospital:  Recommended diet: regular diet. Increase protein intake to promote wound healing.  Recommended activity: no lifting, driving, or strenuous exercise for 4 weeks .You should walk multiple times per day  Special Instructions  No straining, no heavy lifting > 10lbs x 4 weeks.  Keep incision area clean and dry. May shower in 2 days. No baths or pools for 6 weeks.  Please remove dressing tomorrow, no need to apply a bandage afterwards  You have no sutures to remove, the skin is closed with adhesive  Please take pain medications as directed. Take a stool softener if on pain medications  You may resume home Aspirin on post-op day 7  Please Report any of the following: Nausea or Vomiting, Temperature is greater than 101.66F (38.1C) degrees, Dizziness, Abdominal Pain, Difficulty Breathing or Shortness of Breath, Inability to Eat, drink Fluids, or Take medications, Bleeding, swelling, or drainage from surgical incision sites, New numbness or weakness, and Bowel or bladder dysfunction to the neurosurgeon on call at [phone]  Additional Follow up appointments Please follow up with Cooper Render PA-C in [facility] as scheduled in 2-3 weeks   Please see below for scheduled appointments:  No future appointments.

## 2021-08-23 NOTE — Anesthesia Postprocedure Evaluation (Signed)
Anesthesia Post Note  Patient: KAPONO BICKNELL  Procedure(s) Performed: C3-6 LAMINOPLASTY (Neck)  Patient location during evaluation: PACU Anesthesia Type: General Level of consciousness: awake and alert Pain management: pain level controlled Vital Signs Assessment: post-procedure vital signs reviewed and stable Respiratory status: spontaneous breathing, nonlabored ventilation, respiratory function stable and patient connected to nasal cannula oxygen Cardiovascular status: blood pressure returned to baseline and stable Postop Assessment: no apparent nausea or vomiting Anesthetic complications: no   No notable events documented.   Last Vitals:  Vitals:   08/22/21 0736 08/22/21 1216  BP: 140/86 (!) 135/93  Pulse: 86 89  Resp: 16 17  Temp: 36.5 C 36.5 C  SpO2: 91% 94%    Last Pain:  Vitals:   08/22/21 1216  TempSrc: Temporal  PainSc:                  Martha Clan

## 2021-08-26 DIAGNOSIS — R2681 Unsteadiness on feet: Secondary | ICD-10-CM | POA: Diagnosis not present

## 2021-08-26 DIAGNOSIS — F17210 Nicotine dependence, cigarettes, uncomplicated: Secondary | ICD-10-CM | POA: Diagnosis not present

## 2021-08-26 DIAGNOSIS — Z4789 Encounter for other orthopedic aftercare: Secondary | ICD-10-CM | POA: Diagnosis not present

## 2021-08-26 DIAGNOSIS — I10 Essential (primary) hypertension: Secondary | ICD-10-CM | POA: Diagnosis not present

## 2021-08-27 ENCOUNTER — Other Ambulatory Visit: Payer: Self-pay | Admitting: *Deleted

## 2021-08-27 DIAGNOSIS — Z4789 Encounter for other orthopedic aftercare: Secondary | ICD-10-CM | POA: Diagnosis not present

## 2021-08-27 DIAGNOSIS — R2681 Unsteadiness on feet: Secondary | ICD-10-CM | POA: Diagnosis not present

## 2021-08-27 DIAGNOSIS — F17210 Nicotine dependence, cigarettes, uncomplicated: Secondary | ICD-10-CM | POA: Diagnosis not present

## 2021-08-27 DIAGNOSIS — I10 Essential (primary) hypertension: Secondary | ICD-10-CM | POA: Diagnosis not present

## 2021-08-27 NOTE — Patient Outreach (Signed)
[facility]) Care Management  08/27/2021  [name] 1960-11-09 [id number]   Initial telephone outreach for [facility] Care Management.  No answer, left a message on both listed numbers, requesting a return call.  Zara Council. Burgess Estelle, MSN, GNP-BC Gerontological Nurse Practitioner [facility] Care Management [phone]  4:45 pm Ryan Rose returned my call. She reports her husband is doing very well. He is having to take his prescribed pain medication daily but some of the time he is only having to take a 1/2 tablet of oxycodone. He had his first PT visit today. He will have his follow up on 09/02/21. Mrs. Givler reports currently Mr. Lange does not have any chronic illness that he is managing. She agrees to receive our information and to call if she has any concerns.  Zara Council. Burgess Estelle, MSN, [facility] Gerontological Nurse Practitioner [facility] Care Management [phone]

## 2021-08-29 DIAGNOSIS — R2681 Unsteadiness on feet: Secondary | ICD-10-CM | POA: Diagnosis not present

## 2021-08-29 DIAGNOSIS — F17210 Nicotine dependence, cigarettes, uncomplicated: Secondary | ICD-10-CM | POA: Diagnosis not present

## 2021-08-29 DIAGNOSIS — Z4789 Encounter for other orthopedic aftercare: Secondary | ICD-10-CM | POA: Diagnosis not present

## 2021-08-29 DIAGNOSIS — I10 Essential (primary) hypertension: Secondary | ICD-10-CM | POA: Diagnosis not present

## 2021-09-01 DIAGNOSIS — I10 Essential (primary) hypertension: Secondary | ICD-10-CM | POA: Diagnosis not present

## 2021-09-01 DIAGNOSIS — R2681 Unsteadiness on feet: Secondary | ICD-10-CM | POA: Diagnosis not present

## 2021-09-01 DIAGNOSIS — F17210 Nicotine dependence, cigarettes, uncomplicated: Secondary | ICD-10-CM | POA: Diagnosis not present

## 2021-09-01 DIAGNOSIS — Z4789 Encounter for other orthopedic aftercare: Secondary | ICD-10-CM | POA: Diagnosis not present

## 2021-09-03 DIAGNOSIS — I10 Essential (primary) hypertension: Secondary | ICD-10-CM | POA: Diagnosis not present

## 2021-09-03 DIAGNOSIS — Z4789 Encounter for other orthopedic aftercare: Secondary | ICD-10-CM | POA: Diagnosis not present

## 2021-09-03 DIAGNOSIS — R2681 Unsteadiness on feet: Secondary | ICD-10-CM | POA: Diagnosis not present

## 2021-09-03 DIAGNOSIS — F17210 Nicotine dependence, cigarettes, uncomplicated: Secondary | ICD-10-CM | POA: Diagnosis not present

## 2021-09-05 ENCOUNTER — Other Ambulatory Visit: Payer: Self-pay

## 2021-09-05 MED ORDER — OXYCODONE HCL 5 MG PO TABS
ORAL_TABLET | ORAL | 0 refills | Status: DC
  Filled 2021-09-05: qty 30, 7d supply, fill #0

## 2021-09-18 DIAGNOSIS — Z4789 Encounter for other orthopedic aftercare: Secondary | ICD-10-CM | POA: Diagnosis not present

## 2021-09-18 DIAGNOSIS — F17210 Nicotine dependence, cigarettes, uncomplicated: Secondary | ICD-10-CM | POA: Diagnosis not present

## 2021-09-18 DIAGNOSIS — R2681 Unsteadiness on feet: Secondary | ICD-10-CM | POA: Diagnosis not present

## 2021-09-18 DIAGNOSIS — I10 Essential (primary) hypertension: Secondary | ICD-10-CM | POA: Diagnosis not present

## 2021-10-14 DIAGNOSIS — G959 Disease of spinal cord, unspecified: Secondary | ICD-10-CM | POA: Diagnosis not present

## 2021-10-14 DIAGNOSIS — M50322 Other cervical disc degeneration at C5-C6 level: Secondary | ICD-10-CM | POA: Diagnosis not present

## 2021-10-15 ENCOUNTER — Other Ambulatory Visit: Payer: Self-pay

## 2021-11-26 ENCOUNTER — Ambulatory Visit: Payer: 59 | Attending: Neurosurgery

## 2021-11-26 DIAGNOSIS — R262 Difficulty in walking, not elsewhere classified: Secondary | ICD-10-CM | POA: Diagnosis not present

## 2021-11-26 DIAGNOSIS — M6281 Muscle weakness (generalized): Secondary | ICD-10-CM | POA: Insufficient documentation

## 2021-11-26 DIAGNOSIS — M542 Cervicalgia: Secondary | ICD-10-CM | POA: Diagnosis not present

## 2021-11-26 DIAGNOSIS — M5459 Other low back pain: Secondary | ICD-10-CM | POA: Insufficient documentation

## 2021-11-26 DIAGNOSIS — Z9181 History of falling: Secondary | ICD-10-CM | POA: Diagnosis not present

## 2021-11-26 DIAGNOSIS — M5412 Radiculopathy, cervical region: Secondary | ICD-10-CM | POA: Insufficient documentation

## 2021-11-26 DIAGNOSIS — R2681 Unsteadiness on feet: Secondary | ICD-10-CM | POA: Diagnosis not present

## 2021-11-26 NOTE — Therapy (Signed)
[place] ?[facility] ?[address] ?[place], [place], [address] ?Phone: [phone]   Fax:  [phone] ? ?Physical Therapy Evaluation ? ?Patient Details  ?Name: Ryan Rose ?MRN: [id number] ?Date of Birth: 08-22-1960 ?Referring Provider (PT): Venetia Night, MD ? ? ?Encounter Date: 11/26/2021 ? ? PT End of Session - 11/26/21 1019   ? ? Visit Number 1   ? Number of Visits 25   ? Date for PT Re-Evaluation 02/19/22   ? Authorization Type 1   ? Authorization Time Period 10   ? PT Start Time 1020   ? PT Stop Time 1140   ? PT Time Calculation (min) 80 min   ? Equipment Utilized During Treatment Gait belt   NBQC  ? Activity Tolerance Patient tolerated treatment well   ? Behavior During Therapy [facility] for tasks assessed/performed   ? ?  ?  ? ?  ? ? ?Past Medical History:  ?Diagnosis Date  ? Arthritis   ? Elevated blood pressure reading   ? History of methicillin resistant staphylococcus aureus (MRSA)   ? ? ?Past Surgical History:  ?Procedure Laterality Date  ? COLONOSCOPY    ? SKIN GRAFT    ? to hand and forearm  ? ? ?There were no vitals filed for this visit. ? ? ? Subjective Assessment - 11/26/21 1030   ? ? Subjective Posterior neck pain: 4/10 currently. 8/10 at worst for the last month. Lumbar back pain (L1-L5): 4-5/10 currently, 7/10 at most for the past 3 months.     Balance has not improved since his neck surgery   ? Pertinent History imbalance, chronic myelopathy. Balance problems started about 3 years ago. Did not pay it any attention. Worsend last September 2022. Could not get up from being on the ground a few times. 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 S/P neck surgery 08/20/2021. Hands were numb and could not completely stand on his legs. After the surgery, pt got the feeling back on his hands but not his fingers but feels like the fingers are coming back. Still has weakness in his knees. No LE paresthesias, just B weak  knees.  Denies loss of bowel or bladder function. Difficulty turning his neck. Has an appointment with Dr. Myer Haff tomorrow.   ? Patient Stated Goals Walk better.   ? Currently in Pain? Yes   ? Pain Score 4    ? Pain Location Neck   ? Pain Type Acute pain   ? Pain Frequency Occasional   ? Aggravating Factors  turning his neck, first thing in the morning.   ? Pain Relieving Factors Drinking.   ? ?  ?  ? ?  ? ? ? ? ? [facility] PT Assessment - 11/26/21 1024   ? ?  ? Assessment  ? Medical Diagnosis R26.89 (ICD-10-CM) - Imbalance  G95.9 (ICD-10-CM) - Chronic myelopathy (HCC)   ? Referring Provider (PT) Venetia Night, MD   ? Onset Date/Surgical Date 08/20/21   ? Hand Dominance Right   ? Prior Therapy None until after his neck sugery   ?  ? Precautions  ? Precaution Comments Fall risk   ?  ? Restrictions  ? Other Position/Activity Restrictions per MD note:  increase up to 25 pounds until 12 weeks after surgery. After 12 weeks post-op, the patient advised to increase activity as tolerated.   ?  ? Balance Screen  ? Has the patient fallen in  the past 6 months Yes   ? How many times? 2-3   Both knees went out of him. Also tripped on a puppy. Feels like knees are week. Also did not have his NBQC.  ? Has the patient had a decrease in activity level because of a fear of falling?  Yes   None while in the house, but yes when outside of the house  ? Is the patient reluctant to leave their home because of a fear of falling?  Yes   ?  ? Home Environment  ? Additional Comments Pt lives in a one story home with wife. No steps to enter.   ?  ? Observation/Other Assessments  ? Observations Difficulty with sit <> stand, B UE assist needed, SBA   ?  ? Posture/Postural Control  ? Posture Comments B UE assist, B protracted shoulders, slight R lateral shift, slight L trunk rotation, L pelvic rotation, increased B LE base of support, B genu varus   ?  ? AROM  ? Overall AROM Comments Lumbar AROM performed in sitting for safety.   ? Right  Shoulder Flexion 90 Degrees   with R upper trap,  lateral trunk and spine pain; AAROM 125 degrees with pain  ? Right Shoulder ABduction 115 Degrees   125 with neck pain AAROM  ? Left Shoulder Flexion 127 Degrees   142 degrees  ? Left Shoulder ABduction 119 Degrees   with neck pain, AAROM 135 with neck pain  ? Cervical Flexion 1 degree extension   starts at 21 degrees extension resting posture  ? Cervical Extension 42 degrees with posterior neck pain   starts at 21 degrees extension resting posture  ? Cervical - Right Rotation 50 degrees with pain   Pt demonstrates cervical extension compensation  ? Cervical - Left Rotation 35 degrees   Pt demonstrates cervical extension compensation  ? Lumbar Flexion WFL with pain, abberant movement   3/10 back pain  ? Lumbar Extension limited with pain, worse than flexion   flexion preference  ? Lumbar - Right Side Bend limited with low back pain   ? Lumbar - Left Side Bend Limited with low back pain, L > R   ? Lumbar - Right Rotation WFL with R low back pain   ? Lumbar - Left Rotation WFL with L low back pain   ?  ? Strength  ? Right Shoulder Flexion 3-/5   ? Right Shoulder ABduction 4-/5   ? Right Shoulder Internal Rotation 4-/5   ? Right Shoulder External Rotation 4-/5   ? Left Shoulder Flexion 3-/5   ? Left Shoulder ABduction 4-/5   ? Left Shoulder Internal Rotation 4/5   ? Left Shoulder External Rotation 4/5   ? Right Elbow Flexion 4/5   ? Right Elbow Extension 4-/5   ? Left Elbow Flexion 4-/5   ? Left Elbow Extension 4-/5   ? Right Wrist Extension 3+/5   ? Left Wrist Extension 4/5   ? Right Hip Flexion 4-/5   ? Right Hip Extension 4-/5   seated manually resisted  ? Right Hip ABduction 4/5   seated manually resisted clamshell  ? Left Hip Flexion 3+/5   ? Left Hip Extension 4-/5   seated manually resisted  ? Left Hip ABduction 4/5   seated manually resisted clamshell  ? Right Knee Flexion 4+/5   ? Right Knee Extension 4/5   Gives way  ? Left Knee Flexion 4/5   ? Left Knee  Extension 4+/5   Gives way  ?  ? Palpation  ? Palpation comment B lumbar paraspinal muscle tension and increased sensitivity. B upper trap muscle tension, increased sensitivity.   ?  ? Ambulation/Gait  ? Gait Comments NBQC R side. decreased B hip flexion, decreased stance L LE. Increased lateral lean, B LE circumduction, some unsteadiness.   ?  ? Dynamic Gait Index  ? Level Surface Severe Impairment   10 ft without NBQC  ? Change in Gait Speed Severe Impairment   ? Gait with Horizontal Head Turns Severe Impairment   ? Gait with Vertical Head Turns Moderate Impairment   ? Gait and Pivot Turn Severe Impairment   Assistance from NBQC  ? Step Over Obstacle Severe Impairment   ? Step Around Obstacles Moderate Impairment   ? Steps Severe Impairment   ? Total Score 2   ? DGI comment: Performed with NBQC on R   ? ?  ?  ? ?  ? ? ? ? ? ? ? ? ? ? ? ? ? ?Objective measurements completed on examination: See above findings.  ? ? ? ? ?Pt provides verbal permission to email his FOTO survey to his wife  as well as to talk to her about his physical therapy.  ?Wife e-mail:   [name] ? ? ? ? ? ? ? ? ? ?Response to treatment ?Pt tolerated session well without aggravation of symptoms.  ? ? ?Clinical impression ?Pt is a 61 year old male who came to physical therapy secondary to imbalance and chronic myelopathy. He also presents with a score of 2 for his Dynamic Gait Index suggesting severe balance difficulties; altered gait pattern and posture, TTP to neck and low back, neck and low back pain, B UE and LE weakness, and difficulty performing standing tasks as well as gait, curb, and stair negotiation. Pt will benefit from skilled physical therapy services to address the aforementioned deficits.  ? ? ? ? ? ? ? ? ? ? ? ? ? ? ? PT Education - 11/26/21 1334   ? ? Education Details ther-ex, plan of care   ? Person(s) Educated Patient   ? Methods Explanation   ? Comprehension Verbalized understanding   ? ?  ?  ? ?  ? ? ? PT  Short Term Goals - 11/26/21 1321   ? ?  ? PT SHORT TERM GOAL #1  ? Title Pt will be independent with his initial HEP to improve strength, balance, function, decrease fall risk, improve ability to ambulate with les

## 2021-12-01 ENCOUNTER — Ambulatory Visit: Payer: 59

## 2021-12-01 DIAGNOSIS — M6281 Muscle weakness (generalized): Secondary | ICD-10-CM | POA: Diagnosis not present

## 2021-12-01 DIAGNOSIS — Z9181 History of falling: Secondary | ICD-10-CM

## 2021-12-01 DIAGNOSIS — R2681 Unsteadiness on feet: Secondary | ICD-10-CM

## 2021-12-01 DIAGNOSIS — R262 Difficulty in walking, not elsewhere classified: Secondary | ICD-10-CM

## 2021-12-01 DIAGNOSIS — M542 Cervicalgia: Secondary | ICD-10-CM

## 2021-12-01 DIAGNOSIS — M5412 Radiculopathy, cervical region: Secondary | ICD-10-CM

## 2021-12-01 DIAGNOSIS — M5459 Other low back pain: Secondary | ICD-10-CM
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
## 2021-12-03 ENCOUNTER — Ambulatory Visit: Payer: 59

## 2021-12-03 DIAGNOSIS — M542 Cervicalgia: Secondary | ICD-10-CM

## 2021-12-03 DIAGNOSIS — M5412 Radiculopathy, cervical region: Secondary | ICD-10-CM | POA: Diagnosis not present

## 2021-12-03 DIAGNOSIS — R262 Difficulty in walking, not elsewhere classified: Secondary | ICD-10-CM

## 2021-12-03 DIAGNOSIS — Z9181 History of falling: Secondary | ICD-10-CM

## 2021-12-03 DIAGNOSIS — M6281 Muscle weakness (generalized): Secondary | ICD-10-CM | POA: Diagnosis not present

## 2021-12-03 DIAGNOSIS — M5459 Other low back pain: Secondary | ICD-10-CM | POA: Diagnosis not present

## 2021-12-03 DIAGNOSIS — R2681 Unsteadiness on feet: Secondary | ICD-10-CM | POA: Diagnosis not present
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
## 2021-12-04 ENCOUNTER — Ambulatory Visit: Payer: 59 | Admitting: Occupational Therapy

## 2021-12-09 ENCOUNTER — Ambulatory Visit: Payer: 59 | Attending: Neurosurgery

## 2021-12-09 DIAGNOSIS — R262 Difficulty in walking, not elsewhere classified: Secondary | ICD-10-CM | POA: Insufficient documentation

## 2021-12-09 DIAGNOSIS — M5412 Radiculopathy, cervical region: Secondary | ICD-10-CM | POA: Insufficient documentation

## 2021-12-09 DIAGNOSIS — M5459 Other low back pain: Secondary | ICD-10-CM | POA: Diagnosis not present

## 2021-12-09 DIAGNOSIS — M6281 Muscle weakness (generalized): Secondary | ICD-10-CM | POA: Diagnosis not present

## 2021-12-09 DIAGNOSIS — R2681 Unsteadiness on feet: Secondary | ICD-10-CM | POA: Insufficient documentation

## 2021-12-09 DIAGNOSIS — M542 Cervicalgia: Secondary | ICD-10-CM | POA: Diagnosis not present

## 2021-12-09 DIAGNOSIS — Z9181 History of falling: Secondary | ICD-10-CM | POA: Diagnosis not present
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
## 2021-12-11 ENCOUNTER — Ambulatory Visit: Payer: 59

## 2021-12-11 DIAGNOSIS — M5459 Other low back pain: Secondary | ICD-10-CM | POA: Diagnosis not present

## 2021-12-11 DIAGNOSIS — M542 Cervicalgia: Secondary | ICD-10-CM

## 2021-12-11 DIAGNOSIS — M6281 Muscle weakness (generalized): Secondary | ICD-10-CM | POA: Diagnosis not present

## 2021-12-11 DIAGNOSIS — R262 Difficulty in walking, not elsewhere classified: Secondary | ICD-10-CM

## 2021-12-11 DIAGNOSIS — Z9181 History of falling: Secondary | ICD-10-CM | POA: Diagnosis not present

## 2021-12-11 DIAGNOSIS — R2681 Unsteadiness on feet: Secondary | ICD-10-CM

## 2021-12-11 DIAGNOSIS — M5412 Radiculopathy, cervical region: Secondary | ICD-10-CM | POA: Diagnosis not present
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
## 2021-12-15 ENCOUNTER — Ambulatory Visit: Payer: 59

## 2021-12-15 DIAGNOSIS — M6281 Muscle weakness (generalized): Secondary | ICD-10-CM | POA: Diagnosis not present

## 2021-12-15 DIAGNOSIS — R262 Difficulty in walking, not elsewhere classified: Secondary | ICD-10-CM | POA: Diagnosis not present

## 2021-12-15 DIAGNOSIS — M5412 Radiculopathy, cervical region: Secondary | ICD-10-CM | POA: Diagnosis not present

## 2021-12-15 DIAGNOSIS — M5459 Other low back pain: Secondary | ICD-10-CM | POA: Diagnosis not present

## 2021-12-15 DIAGNOSIS — Z9181 History of falling: Secondary | ICD-10-CM | POA: Diagnosis not present

## 2021-12-15 DIAGNOSIS — R2681 Unsteadiness on feet: Secondary | ICD-10-CM | POA: Diagnosis not present

## 2021-12-15 DIAGNOSIS — M542 Cervicalgia: Secondary | ICD-10-CM | POA: Diagnosis not present
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
## 2021-12-17 ENCOUNTER — Ambulatory Visit: Payer: 59

## 2021-12-17 DIAGNOSIS — M6281 Muscle weakness (generalized): Secondary | ICD-10-CM

## 2021-12-17 DIAGNOSIS — M542 Cervicalgia: Secondary | ICD-10-CM | POA: Diagnosis not present

## 2021-12-17 DIAGNOSIS — M5412 Radiculopathy, cervical region: Secondary | ICD-10-CM | POA: Diagnosis not present

## 2021-12-17 DIAGNOSIS — R262 Difficulty in walking, not elsewhere classified: Secondary | ICD-10-CM | POA: Diagnosis not present

## 2021-12-17 DIAGNOSIS — M5459 Other low back pain: Secondary | ICD-10-CM | POA: Diagnosis not present

## 2021-12-17 DIAGNOSIS — Z9181 History of falling: Secondary | ICD-10-CM

## 2021-12-17 DIAGNOSIS — R2681 Unsteadiness on feet: Secondary | ICD-10-CM | POA: Diagnosis not present
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
## 2021-12-22 ENCOUNTER — Ambulatory Visit: Payer: 59

## 2021-12-22 DIAGNOSIS — M6281 Muscle weakness (generalized): Secondary | ICD-10-CM | POA: Diagnosis not present

## 2021-12-22 DIAGNOSIS — Z9181 History of falling: Secondary | ICD-10-CM

## 2021-12-22 DIAGNOSIS — R2681 Unsteadiness on feet: Secondary | ICD-10-CM

## 2021-12-22 DIAGNOSIS — R262 Difficulty in walking, not elsewhere classified: Secondary | ICD-10-CM | POA: Diagnosis not present

## 2021-12-22 DIAGNOSIS — M5459 Other low back pain: Secondary | ICD-10-CM | POA: Diagnosis not present

## 2021-12-22 DIAGNOSIS — M542 Cervicalgia: Secondary | ICD-10-CM

## 2021-12-22 DIAGNOSIS — M5412 Radiculopathy, cervical region: Secondary | ICD-10-CM | POA: Diagnosis not present
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
## 2021-12-24 ENCOUNTER — Ambulatory Visit: Payer: 59

## 2021-12-24 DIAGNOSIS — R2681 Unsteadiness on feet: Secondary | ICD-10-CM | POA: Diagnosis not present

## 2021-12-24 DIAGNOSIS — M5412 Radiculopathy, cervical region: Secondary | ICD-10-CM

## 2021-12-24 DIAGNOSIS — M542 Cervicalgia: Secondary | ICD-10-CM | POA: Diagnosis not present

## 2021-12-24 DIAGNOSIS — M6281 Muscle weakness (generalized): Secondary | ICD-10-CM | POA: Diagnosis not present

## 2021-12-24 DIAGNOSIS — Z9181 History of falling: Secondary | ICD-10-CM | POA: Diagnosis not present

## 2021-12-24 DIAGNOSIS — R262 Difficulty in walking, not elsewhere classified: Secondary | ICD-10-CM | POA: Diagnosis not present

## 2021-12-24 DIAGNOSIS — M5459 Other low back pain: Secondary | ICD-10-CM
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
## 2021-12-29 ENCOUNTER — Ambulatory Visit: Payer: 59

## 2021-12-29 DIAGNOSIS — Z9181 History of falling: Secondary | ICD-10-CM | POA: Diagnosis not present

## 2021-12-29 DIAGNOSIS — M6281 Muscle weakness (generalized): Secondary | ICD-10-CM | POA: Diagnosis not present

## 2021-12-29 DIAGNOSIS — M5459 Other low back pain: Secondary | ICD-10-CM

## 2021-12-29 DIAGNOSIS — R262 Difficulty in walking, not elsewhere classified: Secondary | ICD-10-CM

## 2021-12-29 DIAGNOSIS — M542 Cervicalgia: Secondary | ICD-10-CM | POA: Diagnosis not present

## 2021-12-29 DIAGNOSIS — R2681 Unsteadiness on feet: Secondary | ICD-10-CM

## 2021-12-29 DIAGNOSIS — M5412 Radiculopathy, cervical region: Secondary | ICD-10-CM
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
## 2021-12-31 ENCOUNTER — Ambulatory Visit: Payer: 59

## 2021-12-31 DIAGNOSIS — M5412 Radiculopathy, cervical region: Secondary | ICD-10-CM | POA: Diagnosis not present

## 2021-12-31 DIAGNOSIS — M6281 Muscle weakness (generalized): Secondary | ICD-10-CM | POA: Diagnosis not present

## 2021-12-31 DIAGNOSIS — R262 Difficulty in walking, not elsewhere classified: Secondary | ICD-10-CM | POA: Diagnosis not present

## 2021-12-31 DIAGNOSIS — R2681 Unsteadiness on feet: Secondary | ICD-10-CM | POA: Diagnosis not present

## 2021-12-31 DIAGNOSIS — Z9181 History of falling: Secondary | ICD-10-CM

## 2021-12-31 DIAGNOSIS — M5459 Other low back pain: Secondary | ICD-10-CM | POA: Diagnosis not present

## 2021-12-31 DIAGNOSIS — M542 Cervicalgia: Secondary | ICD-10-CM | POA: Diagnosis not present
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
## 2022-01-08 ENCOUNTER — Ambulatory Visit: Payer: 59 | Attending: Neurosurgery

## 2022-01-08 DIAGNOSIS — M542 Cervicalgia: Secondary | ICD-10-CM | POA: Diagnosis not present

## 2022-01-08 DIAGNOSIS — M5459 Other low back pain: Secondary | ICD-10-CM | POA: Diagnosis not present

## 2022-01-08 DIAGNOSIS — R262 Difficulty in walking, not elsewhere classified: Secondary | ICD-10-CM

## 2022-01-08 DIAGNOSIS — M5412 Radiculopathy, cervical region: Secondary | ICD-10-CM | POA: Diagnosis not present

## 2022-01-08 DIAGNOSIS — R2681 Unsteadiness on feet: Secondary | ICD-10-CM

## 2022-01-08 DIAGNOSIS — M6281 Muscle weakness (generalized): Secondary | ICD-10-CM

## 2022-01-08 DIAGNOSIS — Z9181 History of falling: Secondary | ICD-10-CM
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
## 2022-01-12 ENCOUNTER — Ambulatory Visit: Payer: 59

## 2022-01-12 DIAGNOSIS — M5459 Other low back pain: Secondary | ICD-10-CM | POA: Diagnosis not present

## 2022-01-12 DIAGNOSIS — M6281 Muscle weakness (generalized): Secondary | ICD-10-CM | POA: Diagnosis not present

## 2022-01-12 DIAGNOSIS — R262 Difficulty in walking, not elsewhere classified: Secondary | ICD-10-CM

## 2022-01-12 DIAGNOSIS — R2681 Unsteadiness on feet: Secondary | ICD-10-CM

## 2022-01-12 DIAGNOSIS — M542 Cervicalgia: Secondary | ICD-10-CM

## 2022-01-12 DIAGNOSIS — Z9181 History of falling: Secondary | ICD-10-CM | POA: Diagnosis not present

## 2022-01-12 DIAGNOSIS — M5412 Radiculopathy, cervical region: Secondary | ICD-10-CM | POA: Diagnosis not present
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
## 2022-01-15 ENCOUNTER — Ambulatory Visit: Payer: 59

## 2022-01-20 ENCOUNTER — Ambulatory Visit: Payer: 59

## 2022-01-22 ENCOUNTER — Ambulatory Visit: Payer: 59

## 2022-01-22 DIAGNOSIS — M542 Cervicalgia: Secondary | ICD-10-CM | POA: Diagnosis not present

## 2022-01-22 DIAGNOSIS — Z9181 History of falling: Secondary | ICD-10-CM

## 2022-01-22 DIAGNOSIS — R262 Difficulty in walking, not elsewhere classified: Secondary | ICD-10-CM

## 2022-01-22 DIAGNOSIS — M6281 Muscle weakness (generalized): Secondary | ICD-10-CM | POA: Diagnosis not present

## 2022-01-22 DIAGNOSIS — R2681 Unsteadiness on feet: Secondary | ICD-10-CM | POA: Diagnosis not present

## 2022-01-22 DIAGNOSIS — M5459 Other low back pain: Secondary | ICD-10-CM | POA: Diagnosis not present

## 2022-01-22 DIAGNOSIS — M5412 Radiculopathy, cervical region: Secondary | ICD-10-CM | POA: Diagnosis not present
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
## 2022-01-27 ENCOUNTER — Ambulatory Visit: Payer: 59

## 2022-01-27 DIAGNOSIS — R262 Difficulty in walking, not elsewhere classified: Secondary | ICD-10-CM | POA: Diagnosis not present

## 2022-01-27 DIAGNOSIS — M6281 Muscle weakness (generalized): Secondary | ICD-10-CM

## 2022-01-27 DIAGNOSIS — Z9181 History of falling: Secondary | ICD-10-CM | POA: Diagnosis not present

## 2022-01-27 DIAGNOSIS — E785 Hyperlipidemia, unspecified: Secondary | ICD-10-CM | POA: Diagnosis not present

## 2022-01-27 DIAGNOSIS — M5030 Other cervical disc degeneration, unspecified cervical region: Secondary | ICD-10-CM | POA: Diagnosis not present

## 2022-01-27 DIAGNOSIS — I10 Essential (primary) hypertension: Secondary | ICD-10-CM | POA: Diagnosis not present

## 2022-01-27 DIAGNOSIS — M5459 Other low back pain: Secondary | ICD-10-CM | POA: Diagnosis not present

## 2022-01-27 DIAGNOSIS — M542 Cervicalgia: Secondary | ICD-10-CM | POA: Diagnosis not present

## 2022-01-27 DIAGNOSIS — M1990 Unspecified osteoarthritis, unspecified site: Secondary | ICD-10-CM | POA: Diagnosis not present

## 2022-01-27 DIAGNOSIS — N400 Enlarged prostate without lower urinary tract symptoms: Secondary | ICD-10-CM | POA: Diagnosis not present

## 2022-01-27 DIAGNOSIS — F17200 Nicotine dependence, unspecified, uncomplicated: Secondary | ICD-10-CM | POA: Diagnosis not present

## 2022-01-27 DIAGNOSIS — R2681 Unsteadiness on feet: Secondary | ICD-10-CM

## 2022-01-27 DIAGNOSIS — D7589 Other specified diseases of blood and blood-forming organs: Secondary | ICD-10-CM | POA: Diagnosis not present

## 2022-01-27 DIAGNOSIS — M5412 Radiculopathy, cervical region: Secondary | ICD-10-CM | POA: Diagnosis not present
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
## 2022-01-29 ENCOUNTER — Ambulatory Visit: Payer: 59

## 2022-01-29 DIAGNOSIS — R2681 Unsteadiness on feet: Secondary | ICD-10-CM | POA: Diagnosis not present

## 2022-01-29 DIAGNOSIS — M5412 Radiculopathy, cervical region: Secondary | ICD-10-CM | POA: Diagnosis not present

## 2022-01-29 DIAGNOSIS — Z9181 History of falling: Secondary | ICD-10-CM

## 2022-01-29 DIAGNOSIS — M6281 Muscle weakness (generalized): Secondary | ICD-10-CM | POA: Diagnosis not present

## 2022-01-29 DIAGNOSIS — R262 Difficulty in walking, not elsewhere classified: Secondary | ICD-10-CM

## 2022-01-29 DIAGNOSIS — M5459 Other low back pain: Secondary | ICD-10-CM | POA: Diagnosis not present

## 2022-01-29 DIAGNOSIS — M542 Cervicalgia: Secondary | ICD-10-CM | POA: Diagnosis not present
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
## 2022-02-02 ENCOUNTER — Ambulatory Visit: Payer: 59

## 2022-02-05 ENCOUNTER — Ambulatory Visit: Payer: 59

## 2022-02-09 ENCOUNTER — Ambulatory Visit: Payer: 59

## 2022-02-12 ENCOUNTER — Ambulatory Visit: Payer: 59

## 2022-02-16 ENCOUNTER — Ambulatory Visit: Payer: 59 | Attending: Neurosurgery

## 2022-02-16 DIAGNOSIS — M5412 Radiculopathy, cervical region: Secondary | ICD-10-CM | POA: Diagnosis not present

## 2022-02-16 DIAGNOSIS — Z9181 History of falling: Secondary | ICD-10-CM | POA: Diagnosis not present

## 2022-02-16 DIAGNOSIS — M542 Cervicalgia: Secondary | ICD-10-CM | POA: Diagnosis not present

## 2022-02-16 DIAGNOSIS — R262 Difficulty in walking, not elsewhere classified: Secondary | ICD-10-CM | POA: Diagnosis not present

## 2022-02-16 DIAGNOSIS — M5459 Other low back pain: Secondary | ICD-10-CM | POA: Diagnosis not present

## 2022-02-16 DIAGNOSIS — M6281 Muscle weakness (generalized): Secondary | ICD-10-CM | POA: Diagnosis not present

## 2022-02-16 DIAGNOSIS — R2681 Unsteadiness on feet: Secondary | ICD-10-CM | POA: Insufficient documentation
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
## 2022-02-18 ENCOUNTER — Ambulatory Visit: Payer: 59 | Admitting: Physical Therapy

## 2022-02-18 DIAGNOSIS — M542 Cervicalgia: Secondary | ICD-10-CM | POA: Diagnosis not present

## 2022-02-18 DIAGNOSIS — R262 Difficulty in walking, not elsewhere classified: Secondary | ICD-10-CM | POA: Diagnosis not present

## 2022-02-18 DIAGNOSIS — R2681 Unsteadiness on feet: Secondary | ICD-10-CM | POA: Diagnosis not present

## 2022-02-18 DIAGNOSIS — M6281 Muscle weakness (generalized): Secondary | ICD-10-CM

## 2022-02-18 DIAGNOSIS — M5459 Other low back pain: Secondary | ICD-10-CM | POA: Diagnosis not present

## 2022-02-18 DIAGNOSIS — M5412 Radiculopathy, cervical region: Secondary | ICD-10-CM | POA: Diagnosis not present

## 2022-02-18 DIAGNOSIS — Z9181 History of falling: Secondary | ICD-10-CM
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
## 2022-02-24 ENCOUNTER — Ambulatory Visit: Payer: 59

## 2022-02-24 DIAGNOSIS — Z9181 History of falling: Secondary | ICD-10-CM

## 2022-02-24 DIAGNOSIS — R2681 Unsteadiness on feet: Secondary | ICD-10-CM

## 2022-02-24 DIAGNOSIS — M5412 Radiculopathy, cervical region: Secondary | ICD-10-CM | POA: Diagnosis not present

## 2022-02-24 DIAGNOSIS — M6281 Muscle weakness (generalized): Secondary | ICD-10-CM | POA: Diagnosis not present

## 2022-02-24 DIAGNOSIS — M542 Cervicalgia: Secondary | ICD-10-CM | POA: Diagnosis not present

## 2022-02-24 DIAGNOSIS — R262 Difficulty in walking, not elsewhere classified: Secondary | ICD-10-CM

## 2022-02-24 DIAGNOSIS — M5459 Other low back pain: Secondary | ICD-10-CM | POA: Diagnosis not present
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
## 2022-02-26 ENCOUNTER — Ambulatory Visit: Payer: 59

## 2022-02-26 DIAGNOSIS — R262 Difficulty in walking, not elsewhere classified: Secondary | ICD-10-CM

## 2022-02-26 DIAGNOSIS — M6281 Muscle weakness (generalized): Secondary | ICD-10-CM | POA: Diagnosis not present

## 2022-02-26 DIAGNOSIS — R2681 Unsteadiness on feet: Secondary | ICD-10-CM | POA: Diagnosis not present

## 2022-02-26 DIAGNOSIS — M5412 Radiculopathy, cervical region: Secondary | ICD-10-CM | POA: Diagnosis not present

## 2022-02-26 DIAGNOSIS — M5459 Other low back pain: Secondary | ICD-10-CM | POA: Diagnosis not present

## 2022-02-26 DIAGNOSIS — Z9181 History of falling: Secondary | ICD-10-CM | POA: Diagnosis not present

## 2022-02-26 DIAGNOSIS — M542 Cervicalgia: Secondary | ICD-10-CM | POA: Diagnosis not present
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
## 2022-03-02 DIAGNOSIS — F17210 Nicotine dependence, cigarettes, uncomplicated: Secondary | ICD-10-CM | POA: Diagnosis not present

## 2022-03-02 DIAGNOSIS — M1990 Unspecified osteoarthritis, unspecified site: Secondary | ICD-10-CM | POA: Diagnosis not present

## 2022-03-02 DIAGNOSIS — M5030 Other cervical disc degeneration, unspecified cervical region: Secondary | ICD-10-CM | POA: Diagnosis not present

## 2022-03-02 DIAGNOSIS — R1031 Right lower quadrant pain: Secondary | ICD-10-CM | POA: Diagnosis not present

## 2022-03-02 DIAGNOSIS — F17200 Nicotine dependence, unspecified, uncomplicated: Secondary | ICD-10-CM | POA: Diagnosis not present

## 2022-03-02 DIAGNOSIS — M5412 Radiculopathy, cervical region: Secondary | ICD-10-CM | POA: Diagnosis not present

## 2022-03-02 DIAGNOSIS — Z0001 Encounter for general adult medical examination with abnormal findings: Secondary | ICD-10-CM | POA: Diagnosis not present

## 2022-03-02 DIAGNOSIS — E785 Hyperlipidemia, unspecified: Secondary | ICD-10-CM | POA: Diagnosis not present

## 2022-03-02 DIAGNOSIS — N400 Enlarged prostate without lower urinary tract symptoms: Secondary | ICD-10-CM | POA: Diagnosis not present

## 2022-03-02 DIAGNOSIS — I10 Essential (primary) hypertension: Secondary | ICD-10-CM | POA: Diagnosis not present

## 2022-03-02 DIAGNOSIS — D7589 Other specified diseases of blood and blood-forming organs: Secondary | ICD-10-CM | POA: Diagnosis not present

## 2022-03-03 ENCOUNTER — Ambulatory Visit: Payer: 59

## 2022-03-03 DIAGNOSIS — R2681 Unsteadiness on feet: Secondary | ICD-10-CM | POA: Diagnosis not present

## 2022-03-03 DIAGNOSIS — M6281 Muscle weakness (generalized): Secondary | ICD-10-CM | POA: Diagnosis not present

## 2022-03-03 DIAGNOSIS — R262 Difficulty in walking, not elsewhere classified: Secondary | ICD-10-CM

## 2022-03-03 DIAGNOSIS — Z9181 History of falling: Secondary | ICD-10-CM | POA: Diagnosis not present

## 2022-03-03 DIAGNOSIS — M5459 Other low back pain: Secondary | ICD-10-CM | POA: Diagnosis not present

## 2022-03-03 DIAGNOSIS — M542 Cervicalgia: Secondary | ICD-10-CM | POA: Diagnosis not present

## 2022-03-03 DIAGNOSIS — M5412 Radiculopathy, cervical region: Secondary | ICD-10-CM | POA: Diagnosis not present
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
## 2022-03-05 ENCOUNTER — Ambulatory Visit: Payer: 59

## 2022-03-05 DIAGNOSIS — M5459 Other low back pain: Secondary | ICD-10-CM | POA: Diagnosis not present

## 2022-03-05 DIAGNOSIS — R2681 Unsteadiness on feet: Secondary | ICD-10-CM | POA: Diagnosis not present

## 2022-03-05 DIAGNOSIS — M6281 Muscle weakness (generalized): Secondary | ICD-10-CM | POA: Diagnosis not present

## 2022-03-05 DIAGNOSIS — M5412 Radiculopathy, cervical region: Secondary | ICD-10-CM | POA: Diagnosis not present

## 2022-03-05 DIAGNOSIS — R262 Difficulty in walking, not elsewhere classified: Secondary | ICD-10-CM

## 2022-03-05 DIAGNOSIS — Z9181 History of falling: Secondary | ICD-10-CM | POA: Diagnosis not present

## 2022-03-05 DIAGNOSIS — M542 Cervicalgia: Secondary | ICD-10-CM | POA: Diagnosis not present
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
## 2022-03-10 ENCOUNTER — Ambulatory Visit: Payer: 59 | Attending: Neurosurgery

## 2022-03-10 DIAGNOSIS — M5459 Other low back pain: Secondary | ICD-10-CM | POA: Diagnosis not present

## 2022-03-10 DIAGNOSIS — M5412 Radiculopathy, cervical region: Secondary | ICD-10-CM | POA: Diagnosis not present

## 2022-03-10 DIAGNOSIS — M6281 Muscle weakness (generalized): Secondary | ICD-10-CM | POA: Diagnosis not present

## 2022-03-10 DIAGNOSIS — Z9181 History of falling: Secondary | ICD-10-CM | POA: Diagnosis not present

## 2022-03-10 DIAGNOSIS — M542 Cervicalgia: Secondary | ICD-10-CM | POA: Diagnosis not present

## 2022-03-10 DIAGNOSIS — R262 Difficulty in walking, not elsewhere classified: Secondary | ICD-10-CM | POA: Diagnosis not present

## 2022-03-10 DIAGNOSIS — R2681 Unsteadiness on feet: Secondary | ICD-10-CM | POA: Insufficient documentation
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
## 2022-03-12 ENCOUNTER — Ambulatory Visit: Payer: 59

## 2022-03-17 ENCOUNTER — Ambulatory Visit: Payer: 59

## 2022-03-17 DIAGNOSIS — R2681 Unsteadiness on feet: Secondary | ICD-10-CM

## 2022-03-17 DIAGNOSIS — M6281 Muscle weakness (generalized): Secondary | ICD-10-CM | POA: Diagnosis not present

## 2022-03-17 DIAGNOSIS — M542 Cervicalgia: Secondary | ICD-10-CM

## 2022-03-17 DIAGNOSIS — M5412 Radiculopathy, cervical region: Secondary | ICD-10-CM

## 2022-03-17 DIAGNOSIS — M5459 Other low back pain: Secondary | ICD-10-CM | POA: Diagnosis not present

## 2022-03-17 DIAGNOSIS — R262 Difficulty in walking, not elsewhere classified: Secondary | ICD-10-CM | POA: Diagnosis not present

## 2022-03-17 DIAGNOSIS — Z9181 History of falling: Secondary | ICD-10-CM | POA: Diagnosis not present
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
## 2022-03-19 ENCOUNTER — Ambulatory Visit: Payer: 59

## 2022-03-24 ENCOUNTER — Ambulatory Visit: Payer: 59

## 2022-03-26 ENCOUNTER — Ambulatory Visit: Payer: 59

## 2022-03-26 DIAGNOSIS — M5459 Other low back pain: Secondary | ICD-10-CM | POA: Diagnosis not present

## 2022-03-26 DIAGNOSIS — Z9181 History of falling: Secondary | ICD-10-CM | POA: Diagnosis not present

## 2022-03-26 DIAGNOSIS — M5412 Radiculopathy, cervical region: Secondary | ICD-10-CM

## 2022-03-26 DIAGNOSIS — M6281 Muscle weakness (generalized): Secondary | ICD-10-CM

## 2022-03-26 DIAGNOSIS — R2681 Unsteadiness on feet: Secondary | ICD-10-CM | POA: Diagnosis not present

## 2022-03-26 DIAGNOSIS — M542 Cervicalgia: Secondary | ICD-10-CM

## 2022-03-26 DIAGNOSIS — R262 Difficulty in walking, not elsewhere classified: Secondary | ICD-10-CM
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
## 2022-03-31 ENCOUNTER — Ambulatory Visit: Payer: 59

## 2022-03-31 DIAGNOSIS — R2681 Unsteadiness on feet: Secondary | ICD-10-CM | POA: Diagnosis not present

## 2022-03-31 DIAGNOSIS — M6281 Muscle weakness (generalized): Secondary | ICD-10-CM | POA: Diagnosis not present

## 2022-03-31 DIAGNOSIS — M5412 Radiculopathy, cervical region: Secondary | ICD-10-CM

## 2022-03-31 DIAGNOSIS — R262 Difficulty in walking, not elsewhere classified: Secondary | ICD-10-CM

## 2022-03-31 DIAGNOSIS — M542 Cervicalgia: Secondary | ICD-10-CM | POA: Diagnosis not present

## 2022-03-31 DIAGNOSIS — Z9181 History of falling: Secondary | ICD-10-CM | POA: Diagnosis not present

## 2022-03-31 DIAGNOSIS — M5459 Other low back pain: Secondary | ICD-10-CM | POA: Diagnosis not present
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
## 2022-04-02 ENCOUNTER — Ambulatory Visit: Payer: 59

## 2022-04-02 ENCOUNTER — Telehealth: Payer: Self-pay

## 2022-04-02 NOTE — Telephone Encounter (Signed)
-----   Message from Rockey Situ sent at 04/02/2022  1:13 PM EDT ----- Regarding: work Contact: [phone]  C6-6 laminoplasty on 08/20/21//xrays today He is not seeing Dr.Yarbrough until 05/28/2022. He last saw him April 20 and was told to come back in 6 months. Can he get a work note keeping him out until then. He is currently in PT.

## 2022-04-06 NOTE — Telephone Encounter (Signed)
Doctor's note faxed to [facility] and patient is aware it has been done.

## 2022-04-06 NOTE — Telephone Encounter (Signed)
Please let him know that the work note has been typed up.

## 2022-04-07 ENCOUNTER — Ambulatory Visit: Payer: 59

## 2022-04-07 ENCOUNTER — Other Ambulatory Visit: Payer: Self-pay

## 2022-04-07 DIAGNOSIS — D7589 Other specified diseases of blood and blood-forming organs: Secondary | ICD-10-CM | POA: Diagnosis not present

## 2022-04-07 DIAGNOSIS — M542 Cervicalgia: Secondary | ICD-10-CM | POA: Diagnosis not present

## 2022-04-07 DIAGNOSIS — R262 Difficulty in walking, not elsewhere classified: Secondary | ICD-10-CM

## 2022-04-07 DIAGNOSIS — Z9181 History of falling: Secondary | ICD-10-CM | POA: Diagnosis not present

## 2022-04-07 DIAGNOSIS — M1990 Unspecified osteoarthritis, unspecified site: Secondary | ICD-10-CM | POA: Diagnosis not present

## 2022-04-07 DIAGNOSIS — M5412 Radiculopathy, cervical region: Secondary | ICD-10-CM | POA: Diagnosis not present

## 2022-04-07 DIAGNOSIS — M6281 Muscle weakness (generalized): Secondary | ICD-10-CM | POA: Diagnosis not present

## 2022-04-07 DIAGNOSIS — I10 Essential (primary) hypertension: Secondary | ICD-10-CM | POA: Diagnosis not present

## 2022-04-07 DIAGNOSIS — M5030 Other cervical disc degeneration, unspecified cervical region: Secondary | ICD-10-CM | POA: Diagnosis not present

## 2022-04-07 DIAGNOSIS — R2681 Unsteadiness on feet: Secondary | ICD-10-CM | POA: Diagnosis not present

## 2022-04-07 DIAGNOSIS — E785 Hyperlipidemia, unspecified: Secondary | ICD-10-CM | POA: Diagnosis not present

## 2022-04-07 DIAGNOSIS — M5459 Other low back pain: Secondary | ICD-10-CM | POA: Diagnosis not present

## 2022-04-07 DIAGNOSIS — R1031 Right lower quadrant pain: Secondary | ICD-10-CM | POA: Diagnosis not present

## 2022-04-07 DIAGNOSIS — F17200 Nicotine dependence, unspecified, uncomplicated: Secondary | ICD-10-CM | POA: Diagnosis not present

## 2022-04-07 DIAGNOSIS — N400 Enlarged prostate without lower urinary tract symptoms: Secondary | ICD-10-CM | POA: Diagnosis not present

## 2022-04-07 MED ORDER — TRAMADOL-ACETAMINOPHEN 37.5-325 MG PO TABS
ORAL_TABLET | ORAL | 0 refills | Status: DC
  Filled 2022-04-07: qty 28, 7d supply, fill #0
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
## 2022-04-09 ENCOUNTER — Ambulatory Visit: Payer: 59

## 2022-04-09 DIAGNOSIS — R262 Difficulty in walking, not elsewhere classified: Secondary | ICD-10-CM

## 2022-04-09 DIAGNOSIS — M6281 Muscle weakness (generalized): Secondary | ICD-10-CM

## 2022-04-09 DIAGNOSIS — M5412 Radiculopathy, cervical region: Secondary | ICD-10-CM | POA: Diagnosis not present

## 2022-04-09 DIAGNOSIS — M542 Cervicalgia: Secondary | ICD-10-CM

## 2022-04-09 DIAGNOSIS — R2681 Unsteadiness on feet: Secondary | ICD-10-CM

## 2022-04-09 DIAGNOSIS — M5459 Other low back pain: Secondary | ICD-10-CM | POA: Diagnosis not present

## 2022-04-09 DIAGNOSIS — Z9181 History of falling: Secondary | ICD-10-CM
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
## 2022-04-15 ENCOUNTER — Ambulatory Visit: Payer: 59

## 2022-04-21 ENCOUNTER — Ambulatory Visit: Payer: 59 | Attending: Neurosurgery

## 2022-04-21 DIAGNOSIS — M5459 Other low back pain: Secondary | ICD-10-CM | POA: Insufficient documentation

## 2022-04-21 DIAGNOSIS — M6281 Muscle weakness (generalized): Secondary | ICD-10-CM | POA: Insufficient documentation

## 2022-04-21 DIAGNOSIS — M5412 Radiculopathy, cervical region: Secondary | ICD-10-CM | POA: Diagnosis not present

## 2022-04-21 DIAGNOSIS — R2681 Unsteadiness on feet: Secondary | ICD-10-CM | POA: Insufficient documentation

## 2022-04-21 DIAGNOSIS — Z9181 History of falling: Secondary | ICD-10-CM | POA: Insufficient documentation

## 2022-04-21 DIAGNOSIS — R262 Difficulty in walking, not elsewhere classified: Secondary | ICD-10-CM | POA: Insufficient documentation

## 2022-04-21 DIAGNOSIS — M542 Cervicalgia: Secondary | ICD-10-CM | POA: Diagnosis not present
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
## 2022-04-23 ENCOUNTER — Ambulatory Visit: Payer: 59

## 2022-04-28 ENCOUNTER — Ambulatory Visit: Payer: 59

## 2022-04-30 ENCOUNTER — Ambulatory Visit: Payer: 59

## 2022-05-05 ENCOUNTER — Ambulatory Visit: Payer: 59

## 2022-05-05 DIAGNOSIS — R262 Difficulty in walking, not elsewhere classified: Secondary | ICD-10-CM

## 2022-05-05 DIAGNOSIS — M5459 Other low back pain: Secondary | ICD-10-CM

## 2022-05-05 DIAGNOSIS — M542 Cervicalgia: Secondary | ICD-10-CM

## 2022-05-05 DIAGNOSIS — M5412 Radiculopathy, cervical region: Secondary | ICD-10-CM | POA: Diagnosis not present

## 2022-05-05 DIAGNOSIS — M6281 Muscle weakness (generalized): Secondary | ICD-10-CM

## 2022-05-05 DIAGNOSIS — R2681 Unsteadiness on feet: Secondary | ICD-10-CM | POA: Diagnosis not present

## 2022-05-05 DIAGNOSIS — Z9181 History of falling: Secondary | ICD-10-CM | POA: Diagnosis not present
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
## 2022-05-07 ENCOUNTER — Ambulatory Visit: Payer: 59

## 2022-05-07 DIAGNOSIS — M6281 Muscle weakness (generalized): Secondary | ICD-10-CM

## 2022-05-07 DIAGNOSIS — R262 Difficulty in walking, not elsewhere classified: Secondary | ICD-10-CM | POA: Diagnosis not present

## 2022-05-07 DIAGNOSIS — Z9181 History of falling: Secondary | ICD-10-CM

## 2022-05-07 DIAGNOSIS — R2681 Unsteadiness on feet: Secondary | ICD-10-CM | POA: Diagnosis not present

## 2022-05-07 DIAGNOSIS — M5459 Other low back pain: Secondary | ICD-10-CM | POA: Diagnosis not present

## 2022-05-07 DIAGNOSIS — M5412 Radiculopathy, cervical region: Secondary | ICD-10-CM | POA: Diagnosis not present

## 2022-05-07 DIAGNOSIS — M542 Cervicalgia: Secondary | ICD-10-CM | POA: Diagnosis not present
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
## 2022-05-11 DIAGNOSIS — R1031 Right lower quadrant pain: Secondary | ICD-10-CM | POA: Diagnosis not present

## 2022-05-12 ENCOUNTER — Ambulatory Visit: Payer: 59 | Attending: Neurosurgery

## 2022-05-12 DIAGNOSIS — M5459 Other low back pain: Secondary | ICD-10-CM | POA: Insufficient documentation

## 2022-05-12 DIAGNOSIS — M542 Cervicalgia: Secondary | ICD-10-CM | POA: Insufficient documentation

## 2022-05-12 DIAGNOSIS — M5412 Radiculopathy, cervical region: Secondary | ICD-10-CM | POA: Diagnosis not present

## 2022-05-12 DIAGNOSIS — R2681 Unsteadiness on feet: Secondary | ICD-10-CM | POA: Diagnosis not present

## 2022-05-12 DIAGNOSIS — Z9181 History of falling: Secondary | ICD-10-CM | POA: Diagnosis not present

## 2022-05-12 DIAGNOSIS — R262 Difficulty in walking, not elsewhere classified: Secondary | ICD-10-CM | POA: Insufficient documentation

## 2022-05-12 DIAGNOSIS — M6281 Muscle weakness (generalized): Secondary | ICD-10-CM | POA: Diagnosis not present
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
## 2022-05-13 ENCOUNTER — Other Ambulatory Visit: Payer: Self-pay

## 2022-05-13 DIAGNOSIS — E785 Hyperlipidemia, unspecified: Secondary | ICD-10-CM | POA: Diagnosis not present

## 2022-05-13 DIAGNOSIS — N400 Enlarged prostate without lower urinary tract symptoms: Secondary | ICD-10-CM | POA: Diagnosis not present

## 2022-05-13 DIAGNOSIS — R1031 Right lower quadrant pain: Secondary | ICD-10-CM | POA: Diagnosis not present

## 2022-05-13 DIAGNOSIS — I10 Essential (primary) hypertension: Secondary | ICD-10-CM | POA: Diagnosis not present

## 2022-05-13 DIAGNOSIS — M5030 Other cervical disc degeneration, unspecified cervical region: Secondary | ICD-10-CM | POA: Diagnosis not present

## 2022-05-13 DIAGNOSIS — M5412 Radiculopathy, cervical region: Secondary | ICD-10-CM | POA: Diagnosis not present

## 2022-05-13 DIAGNOSIS — D7589 Other specified diseases of blood and blood-forming organs: Secondary | ICD-10-CM | POA: Diagnosis not present

## 2022-05-13 DIAGNOSIS — F17200 Nicotine dependence, unspecified, uncomplicated: Secondary | ICD-10-CM | POA: Diagnosis not present

## 2022-05-13 DIAGNOSIS — M1990 Unspecified osteoarthritis, unspecified site: Secondary | ICD-10-CM | POA: Diagnosis not present

## 2022-05-13 MED ORDER — TRAMADOL HCL 50 MG PO TABS
ORAL_TABLET | ORAL | 0 refills | Status: DC
  Filled 2022-05-13: qty 20, 5d supply, fill #0

## 2022-05-13 MED ORDER — ATORVASTATIN CALCIUM 10 MG PO TABS
10.0000 mg | ORAL_TABLET | Freq: Every evening | ORAL | 1 refills | Status: DC
  Filled 2022-05-13: qty 30, 30d supply, fill #0

## 2022-05-13 MED ORDER — ACETAMINOPHEN 500 MG PO TABS: ORAL_TABLET | ORAL | 0 refills | Status: DC

## 2022-05-14 ENCOUNTER — Ambulatory Visit: Payer: 59

## 2022-05-19 ENCOUNTER — Ambulatory Visit: Payer: 59

## 2022-05-19 DIAGNOSIS — M5459 Other low back pain: Secondary | ICD-10-CM | POA: Diagnosis not present

## 2022-05-19 DIAGNOSIS — R2681 Unsteadiness on feet: Secondary | ICD-10-CM | POA: Diagnosis not present

## 2022-05-19 DIAGNOSIS — R262 Difficulty in walking, not elsewhere classified: Secondary | ICD-10-CM

## 2022-05-19 DIAGNOSIS — Z9181 History of falling: Secondary | ICD-10-CM | POA: Diagnosis not present

## 2022-05-19 DIAGNOSIS — M5412 Radiculopathy, cervical region: Secondary | ICD-10-CM

## 2022-05-19 DIAGNOSIS — M6281 Muscle weakness (generalized): Secondary | ICD-10-CM | POA: Diagnosis not present

## 2022-05-19 DIAGNOSIS — M542 Cervicalgia: Secondary | ICD-10-CM
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
## 2022-05-21 ENCOUNTER — Ambulatory Visit: Payer: 59

## 2022-05-26 ENCOUNTER — Ambulatory Visit: Payer: 59

## 2022-05-26 ENCOUNTER — Other Ambulatory Visit: Payer: Self-pay

## 2022-05-26 DIAGNOSIS — R262 Difficulty in walking, not elsewhere classified: Secondary | ICD-10-CM

## 2022-05-26 DIAGNOSIS — Z0389 Encounter for observation for other suspected diseases and conditions ruled out: Secondary | ICD-10-CM | POA: Diagnosis not present

## 2022-05-26 DIAGNOSIS — D696 Thrombocytopenia, unspecified: Secondary | ICD-10-CM | POA: Diagnosis not present

## 2022-05-26 DIAGNOSIS — I7143 Infrarenal abdominal aortic aneurysm, without rupture: Secondary | ICD-10-CM | POA: Diagnosis not present

## 2022-05-26 DIAGNOSIS — R112 Nausea with vomiting, unspecified: Secondary | ICD-10-CM | POA: Diagnosis not present

## 2022-05-26 DIAGNOSIS — F1010 Alcohol abuse, uncomplicated: Secondary | ICD-10-CM | POA: Diagnosis not present

## 2022-05-26 DIAGNOSIS — M47812 Spondylosis without myelopathy or radiculopathy, cervical region: Secondary | ICD-10-CM | POA: Diagnosis not present

## 2022-05-26 DIAGNOSIS — Z7982 Long term (current) use of aspirin: Secondary | ICD-10-CM | POA: Diagnosis not present

## 2022-05-26 DIAGNOSIS — I10 Essential (primary) hypertension: Secondary | ICD-10-CM | POA: Diagnosis not present

## 2022-05-26 DIAGNOSIS — M6281 Muscle weakness (generalized): Secondary | ICD-10-CM

## 2022-05-26 DIAGNOSIS — D539 Nutritional anemia, unspecified: Secondary | ICD-10-CM | POA: Diagnosis not present

## 2022-05-26 DIAGNOSIS — Z789 Other specified health status: Secondary | ICD-10-CM | POA: Diagnosis not present

## 2022-05-26 DIAGNOSIS — K5792 Diverticulitis of intestine, part unspecified, without perforation or abscess without bleeding: Secondary | ICD-10-CM | POA: Diagnosis not present

## 2022-05-26 DIAGNOSIS — M542 Cervicalgia: Secondary | ICD-10-CM

## 2022-05-26 DIAGNOSIS — R000 Tachycardia, unspecified: Secondary | ICD-10-CM | POA: Diagnosis not present

## 2022-05-26 DIAGNOSIS — K659 Peritonitis, unspecified: Secondary | ICD-10-CM | POA: Diagnosis not present

## 2022-05-26 DIAGNOSIS — R1032 Left lower quadrant pain: Secondary | ICD-10-CM | POA: Diagnosis not present

## 2022-05-26 DIAGNOSIS — E871 Hypo-osmolality and hyponatremia: Secondary | ICD-10-CM | POA: Diagnosis not present

## 2022-05-26 DIAGNOSIS — E860 Dehydration: Secondary | ICD-10-CM | POA: Diagnosis not present

## 2022-05-26 DIAGNOSIS — Z79899 Other long term (current) drug therapy: Secondary | ICD-10-CM | POA: Diagnosis not present

## 2022-05-26 DIAGNOSIS — R2681 Unsteadiness on feet: Secondary | ICD-10-CM

## 2022-05-26 DIAGNOSIS — Z09 Encounter for follow-up examination after completed treatment for conditions other than malignant neoplasm: Secondary | ICD-10-CM | POA: Diagnosis not present

## 2022-05-26 DIAGNOSIS — K5732 Diverticulitis of large intestine without perforation or abscess without bleeding: Secondary | ICD-10-CM | POA: Diagnosis not present

## 2022-05-26 DIAGNOSIS — G959 Disease of spinal cord, unspecified: Secondary | ICD-10-CM

## 2022-05-26 DIAGNOSIS — Z87891 Personal history of nicotine dependence: Secondary | ICD-10-CM | POA: Diagnosis not present

## 2022-05-26 DIAGNOSIS — M5459 Other low back pain: Secondary | ICD-10-CM

## 2022-05-26 DIAGNOSIS — K760 Fatty (change of) liver, not elsewhere classified: Secondary | ICD-10-CM | POA: Diagnosis not present

## 2022-05-26 DIAGNOSIS — M5412 Radiculopathy, cervical region: Secondary | ICD-10-CM

## 2022-05-26 DIAGNOSIS — A419 Sepsis, unspecified organism: Secondary | ICD-10-CM | POA: Diagnosis not present

## 2022-05-26 DIAGNOSIS — R1084 Generalized abdominal pain: Secondary | ICD-10-CM | POA: Diagnosis not present

## 2022-05-26 DIAGNOSIS — F10929 Alcohol use, unspecified with intoxication, unspecified: Secondary | ICD-10-CM | POA: Diagnosis not present

## 2022-05-26 DIAGNOSIS — Z882 Allergy status to sulfonamides status: Secondary | ICD-10-CM | POA: Diagnosis not present

## 2022-05-26 DIAGNOSIS — Z9181 History of falling: Secondary | ICD-10-CM
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
## 2022-05-28 ENCOUNTER — Other Ambulatory Visit: Payer: Self-pay

## 2022-05-28 ENCOUNTER — Emergency Department: Payer: 59

## 2022-05-28 ENCOUNTER — Ambulatory Visit
Admission: RE | Admit: 2022-05-28 | Discharge: 2022-05-28 | Disposition: A | Discharge status: Home / Self Care | Payer: 59 | Source: Ambulatory Visit | Attending: Neurosurgery | Admitting: Neurosurgery

## 2022-05-28 ENCOUNTER — Inpatient Hospital Stay
Admission: EM | Admit: 2022-05-28 | Discharge: 2022-05-30 | DRG: 871 | Disposition: A | Discharge status: Home / Self Care | Payer: 59 | Attending: Internal Medicine | Admitting: Internal Medicine

## 2022-05-28 ENCOUNTER — Ambulatory Visit: Payer: 59

## 2022-05-28 ENCOUNTER — Encounter: Payer: Self-pay | Admitting: Neurosurgery

## 2022-05-28 ENCOUNTER — Ambulatory Visit (INDEPENDENT_AMBULATORY_CARE_PROVIDER_SITE_OTHER): Payer: 59 | Admitting: Neurosurgery

## 2022-05-28 VITALS — BP 122/76 | Ht 68.5 in | Wt 164.0 lb

## 2022-05-28 DIAGNOSIS — G959 Disease of spinal cord, unspecified: Secondary | ICD-10-CM

## 2022-05-28 DIAGNOSIS — K5792 Diverticulitis of intestine, part unspecified, without perforation or abscess without bleeding: Principal | ICD-10-CM

## 2022-05-28 DIAGNOSIS — Z882 Allergy status to sulfonamides status: Secondary | ICD-10-CM

## 2022-05-28 DIAGNOSIS — Z7982 Long term (current) use of aspirin: Secondary | ICD-10-CM

## 2022-05-28 DIAGNOSIS — K659 Peritonitis, unspecified: Secondary | ICD-10-CM | POA: Diagnosis present

## 2022-05-28 DIAGNOSIS — R6520 Severe sepsis without septic shock: Secondary | ICD-10-CM | POA: Diagnosis present

## 2022-05-28 DIAGNOSIS — K5732 Diverticulitis of large intestine without perforation or abscess without bleeding: Secondary | ICD-10-CM | POA: Diagnosis present

## 2022-05-28 DIAGNOSIS — D696 Thrombocytopenia, unspecified: Secondary | ICD-10-CM | POA: Diagnosis present

## 2022-05-28 DIAGNOSIS — E871 Hypo-osmolality and hyponatremia: Secondary | ICD-10-CM | POA: Diagnosis present

## 2022-05-28 DIAGNOSIS — D539 Nutritional anemia, unspecified: Secondary | ICD-10-CM | POA: Diagnosis present

## 2022-05-28 DIAGNOSIS — Z79899 Other long term (current) drug therapy: Secondary | ICD-10-CM

## 2022-05-28 DIAGNOSIS — A419 Sepsis, unspecified organism: Principal | ICD-10-CM | POA: Diagnosis present

## 2022-05-28 DIAGNOSIS — Z789 Other specified health status: Secondary | ICD-10-CM

## 2022-05-28 DIAGNOSIS — F1010 Alcohol abuse, uncomplicated: Secondary | ICD-10-CM | POA: Diagnosis present

## 2022-05-28 DIAGNOSIS — Z09 Encounter for follow-up examination after completed treatment for conditions other than malignant neoplasm: Secondary | ICD-10-CM | POA: Diagnosis not present

## 2022-05-28 DIAGNOSIS — I10 Essential (primary) hypertension: Secondary | ICD-10-CM | POA: Diagnosis present

## 2022-05-28 DIAGNOSIS — I714 Abdominal aortic aneurysm, without rupture, unspecified: Secondary | ICD-10-CM | POA: Diagnosis present

## 2022-05-28 DIAGNOSIS — I7143 Infrarenal abdominal aortic aneurysm, without rupture: Secondary | ICD-10-CM

## 2022-05-28 DIAGNOSIS — Z87891 Personal history of nicotine dependence: Secondary | ICD-10-CM

## 2022-05-28 DIAGNOSIS — R112 Nausea with vomiting, unspecified: Secondary | ICD-10-CM

## 2022-05-28 DIAGNOSIS — R7401 Elevation of levels of liver transaminase levels: Secondary | ICD-10-CM | POA: Diagnosis present

## 2022-05-28 DIAGNOSIS — M47812 Spondylosis without myelopathy or radiculopathy, cervical region: Secondary | ICD-10-CM | POA: Diagnosis not present

## 2022-05-28 DIAGNOSIS — E860 Dehydration: Secondary | ICD-10-CM | POA: Diagnosis present

## 2022-05-28 DIAGNOSIS — R1032 Left lower quadrant pain: Secondary | ICD-10-CM

## 2022-05-28 HISTORY — DX: Diverticulitis of large intestine without perforation or abscess without bleeding: K57.32

## 2022-05-28 HISTORY — DX: Infrarenal abdominal aortic aneurysm, without rupture: I71.43

## 2022-05-28 LAB — COMPREHENSIVE METABOLIC PANEL
ALT: 39 U/L (ref 0–44)
AST: 125 U/L — ABNORMAL HIGH (ref 15–41)
Albumin: 4.1 g/dL (ref 3.5–5.0)
Alkaline Phosphatase: 89 U/L (ref 38–126)
Anion gap: 15 (ref 5–15)
BUN: 8 mg/dL (ref 8–23)
CO2: 20 mmol/L — ABNORMAL LOW (ref 22–32)
Calcium: 8.8 mg/dL — ABNORMAL LOW (ref 8.9–10.3)
Chloride: 99 mmol/L (ref 98–111)
Creatinine, Ser: 0.57 mg/dL — ABNORMAL LOW (ref 0.61–1.24)
GFR, Estimated: >60 mL/min (ref >60)
Glucose, Bld: 91 mg/dL (ref 70–99)
Potassium: 3.9 mmol/L (ref 3.5–5.1)
Sodium: 134 mmol/L — ABNORMAL LOW (ref 135–145)
Total Bilirubin: 2.2 mg/dL — ABNORMAL HIGH (ref 0.3–1.2)
Total Protein: 8.1 g/dL (ref 6.5–8.1)

## 2022-05-28 LAB — CBC
HCT: 38 % — ABNORMAL LOW (ref 39.0–52.0)
Hemoglobin: 13.1 g/dL (ref 13.0–17.0)
MCH: 35.7 pg — ABNORMAL HIGH (ref 26.0–34.0)
MCHC: 34.5 g/dL (ref 30.0–36.0)
MCV: 103.5 fL — ABNORMAL HIGH (ref 80.0–100.0)
Platelets: 175 10*3/uL (ref 150–400)
RBC: 3.67 MIL/uL — ABNORMAL LOW (ref 4.22–5.81)
RDW: 13 % (ref 11.5–15.5)
WBC: 8.3 10*3/uL (ref 4.0–10.5)
nRBC: 0 % (ref 0.0–0.2)

## 2022-05-28 LAB — PROTIME-INR
INR: 1 (ref 0.8–1.2)
Prothrombin Time: 13 seconds (ref 11.4–15.2)

## 2022-05-28 LAB — APTT: aPTT: 28 seconds (ref 24–36)

## 2022-05-28 LAB — LIPASE, BLOOD: Lipase: 27 U/L (ref 11–51)

## 2022-05-28 LAB — LACTIC ACID, PLASMA
Lactic Acid, Venous: 4.4 mmol/L (ref 0.5–1.9)
Lactic Acid, Venous: 2.8 mmol/L (ref 0.5–1.9)

## 2022-05-28 MED ORDER — TRAZODONE HCL 50 MG PO TABS
25.0000 mg | ORAL_TABLET | Freq: Every evening | ORAL | Status: DC | PRN
  Administered 2022-05-29: 25 mg via ORAL
  Filled 2022-05-28: qty 1

## 2022-05-28 MED ORDER — ADULT MULTIVITAMIN W/MINERALS CH
1.0000 | ORAL_TABLET | Freq: Every day | ORAL | Status: DC
  Administered 2022-05-29 – 2022-05-30 (×2): 1 via ORAL
  Filled 2022-05-28 (×2): qty 1

## 2022-05-28 MED ORDER — DIAZEPAM 5 MG PO TABS
10.0000 mg | ORAL_TABLET | Freq: Once | ORAL | Status: AC
  Administered 2022-05-28: 10 mg via ORAL
  Filled 2022-05-28: qty 2

## 2022-05-28 MED ORDER — ACETAMINOPHEN 500 MG PO TABS
500.0000 mg | ORAL_TABLET | Freq: Four times a day (QID) | ORAL | Status: DC | PRN
  Administered 2022-05-29: 500 mg via ORAL
  Filled 2022-05-28: qty 1

## 2022-05-28 MED ORDER — ASPIRIN 81 MG PO CHEW
81.0000 mg | CHEWABLE_TABLET | Freq: Every day | ORAL | Status: DC
  Administered 2022-05-29 – 2022-05-30 (×2): 81 mg via ORAL
  Filled 2022-05-28 (×2): qty 1

## 2022-05-28 MED ORDER — SODIUM CHLORIDE 0.9 % IV SOLN
2.0000 g | Freq: Once | INTRAVENOUS | Status: AC
  Administered 2022-05-28: 2 g via INTRAVENOUS
  Filled 2022-05-28: qty 12.5

## 2022-05-28 MED ORDER — ONDANSETRON HCL 4 MG/2ML IJ SOLN
4.0000 mg | Freq: Once | INTRAMUSCULAR | Status: AC
  Administered 2022-05-28: 4 mg via INTRAVENOUS
  Filled 2022-05-28: qty 2

## 2022-05-28 MED ORDER — VANCOMYCIN HCL 1750 MG/350ML IV SOLN
1750.0000 mg | Freq: Once | INTRAVENOUS | Status: AC
  Administered 2022-05-28: 1750 mg via INTRAVENOUS
  Filled 2022-05-28: qty 350

## 2022-05-28 MED ORDER — MELATONIN 5 MG PO TABS
2.5000 mg | ORAL_TABLET | Freq: Every day | ORAL | Status: DC
  Administered 2022-05-28 – 2022-05-29 (×2): 2.5 mg via ORAL
  Filled 2022-05-28 (×2): qty 1

## 2022-05-28 MED ORDER — LORAZEPAM 2 MG PO TABS: 0.0000 mg | ORAL_TABLET | Freq: Two times a day (BID) | ORAL | Status: DC

## 2022-05-28 MED ORDER — SODIUM CHLORIDE 0.9 % IV SOLN: 250.0000 mL | INTRAVENOUS | Status: DC | PRN

## 2022-05-28 MED ORDER — THIAMINE HCL 100 MG/ML IJ SOLN
100.0000 mg | Freq: Every day | INTRAMUSCULAR | Status: DC
  Administered 2022-05-29 – 2022-05-30 (×2): 100 mg via INTRAVENOUS
  Filled 2022-05-28 (×2): qty 2

## 2022-05-28 MED ORDER — ATORVASTATIN CALCIUM 10 MG PO TABS
10.0000 mg | ORAL_TABLET | Freq: Every evening | ORAL | Status: DC
  Administered 2022-05-29: 10 mg via ORAL
  Filled 2022-05-28: qty 1

## 2022-05-28 MED ORDER — LORAZEPAM 2 MG PO TABS
0.0000 mg | ORAL_TABLET | Freq: Four times a day (QID) | ORAL | Status: DC
  Administered 2022-05-29: 2 mg via ORAL
  Filled 2022-05-28 (×2): qty 1

## 2022-05-28 MED ORDER — SODIUM CHLORIDE 0.9% FLUSH: 3.0000 mL | INTRAVENOUS | Status: DC | PRN

## 2022-05-28 MED ORDER — SODIUM CHLORIDE 0.9 % IV BOLUS
2000.0000 mL | Freq: Once | INTRAVENOUS | Status: AC
  Administered 2022-05-28: 2000 mL via INTRAVENOUS

## 2022-05-28 MED ORDER — MORPHINE SULFATE (PF) 4 MG/ML IV SOLN
4.0000 mg | Freq: Once | INTRAVENOUS | Status: AC | PRN
  Administered 2022-05-28: 4 mg via INTRAVENOUS
  Filled 2022-05-28: qty 1

## 2022-05-28 MED ORDER — FOLIC ACID 1 MG PO TABS
1.0000 mg | ORAL_TABLET | Freq: Every day | ORAL | Status: DC
  Administered 2022-05-29 – 2022-05-30 (×2): 1 mg via ORAL
  Filled 2022-05-28 (×2): qty 1

## 2022-05-28 MED ORDER — PIPERACILLIN-TAZOBACTAM 3.375 G IVPB 30 MIN: 3.3750 g | Freq: Three times a day (TID) | INTRAVENOUS | Status: DC

## 2022-05-28 MED ORDER — SENNA 8.6 MG PO TABS
1.0000 | ORAL_TABLET | Freq: Two times a day (BID) | ORAL | Status: DC
  Administered 2022-05-29 – 2022-05-30 (×3): 8.6 mg via ORAL
  Filled 2022-05-28 (×3): qty 1

## 2022-05-28 MED ORDER — ENOXAPARIN SODIUM 40 MG/0.4ML IJ SOSY
40.0000 mg | PREFILLED_SYRINGE | INTRAMUSCULAR | Status: DC
  Administered 2022-05-29 – 2022-05-30 (×2): 40 mg via SUBCUTANEOUS
  Filled 2022-05-28 (×2): qty 0.4

## 2022-05-28 MED ORDER — THIAMINE MONONITRATE 100 MG PO TABS
100.0000 mg | ORAL_TABLET | Freq: Every day | ORAL | Status: DC
  Filled 2022-05-28 (×2): qty 1

## 2022-05-28 MED ORDER — TRAMADOL HCL 50 MG PO TABS: 50.0000 mg | ORAL_TABLET | Freq: Four times a day (QID) | ORAL | Status: DC | PRN

## 2022-05-28 MED ORDER — LORAZEPAM 2 MG/ML IJ SOLN: 1.0000 mg | INTRAMUSCULAR | Status: DC | PRN

## 2022-05-28 MED ORDER — IOHEXOL 300 MG/ML  SOLN
100.0000 mL | Freq: Once | INTRAMUSCULAR | Status: AC | PRN
  Administered 2022-05-28: 100 mL via INTRAVENOUS

## 2022-05-28 MED ORDER — LORAZEPAM 1 MG PO TABS
1.0000 mg | ORAL_TABLET | ORAL | Status: DC | PRN
  Administered 2022-05-29: 2 mg via ORAL
  Administered 2022-05-29: 1 mg via ORAL
  Filled 2022-05-28: qty 2

## 2022-05-28 MED ORDER — SODIUM CHLORIDE 0.9% FLUSH: 3.0000 mL | Freq: Two times a day (BID) | INTRAVENOUS | Status: DC

## 2022-05-28 MED ORDER — PIPERACILLIN-TAZOBACTAM 3.375 G IVPB
3.3750 g | Freq: Three times a day (TID) | INTRAVENOUS | Status: DC
  Administered 2022-05-29 – 2022-05-30 (×4): 3.375 g via INTRAVENOUS
  Filled 2022-05-28 (×4): qty 50

## 2022-05-28 NOTE — ED Triage Notes (Signed)
Pt presents to ED today with c/o of ABD pain, N/V for 3 weeks and states today the pain is worse as well as vomiting. Pt denies diarrhea. Pt denies fevers or chills. Pt is A&Ox4 at this time.

## 2022-05-28 NOTE — ED Provider Notes (Signed)
[facility] Provider Note    Event Date/Time   First MD Initiated Contact with Patient 05/28/22 1820     (approximate)   History   Abdominal Pain and Emesis   HPI  Ryan Rose is a 61 y.o. male   Past medical history of hypertension and arthritis who presents to the emergency department today with nausea and vomiting ongoing for several weeks and some left lower quadrant discomfort that turned now severe this morning in the left lower quadrant.  Denies fever or chills, no blood in the stools or melena -has been passing flatus and making daily bowel movements formed and brown.  Is a daily drinker and drinks half a pint of hard liquor daily.  He has had withdrawal symptoms of shakes, tremors, anxiety in the past but never seizures.  His last drink was last night.   History was obtained via the patient and a family member at bedside      Physical Exam   Triage Vital Signs: ED Triage Vitals  Enc Vitals Group     BP 05/28/22 1508 114/74     Pulse Rate 05/28/22 1508 (!) 126     Resp 05/28/22 1508 20     Temp 05/28/22 1508 97.7 F (36.5 C)     Temp Source 05/28/22 1508 Oral     SpO2 05/28/22 1508 93 %     Weight --      Height --      Head Circumference --      Peak Flow --      Pain Score 05/28/22 1509 8     Pain Loc --      Pain Edu? --      Excl. in GC? --     Most recent vital signs: Vitals:   05/28/22 1930 05/28/22 2000  BP: 128/81 114/81  Pulse: (!) 102 98  Resp: 18 18  Temp:    SpO2: 94% 95%    General: Awake, no distress.  Alert and oriented CV:  Tachycardic 120s and normotensive Resp:  Normal effort.  Abd:  No distention.  Left lower quadrant tenderness Other:  Nontoxic-appearing, alert and oriented   ED Results / Procedures / Treatments   Labs (all labs ordered are listed, but only abnormal results are displayed) Labs Reviewed  CBC - Abnormal; Notable for the following components:      Result Value   RBC 3.67  (*)    HCT 38.0 (*)    MCV 103.5 (*)    MCH 35.7 (*)    All other components within normal limits  LACTIC ACID, PLASMA - Abnormal; Notable for the following components:   Lactic Acid, Venous 4.4 (*)    All other components within normal limits  LACTIC ACID, PLASMA - Abnormal; Notable for the following components:   Lactic Acid, Venous 2.8 (*)    All other components within normal limits  COMPREHENSIVE METABOLIC PANEL - Abnormal; Notable for the following components:   Sodium 134 (*)    CO2 20 (*)    Creatinine, Ser 0.57 (*)    Calcium 8.8 (*)    AST 125 (*)    Total Bilirubin 2.2 (*)    All other components within normal limits  CULTURE, BLOOD (ROUTINE X 2)  CULTURE, BLOOD (ROUTINE X 2)  URINE CULTURE  LIPASE, BLOOD  PROTIME-INR  APTT  URINALYSIS, ROUTINE W REFLEX MICROSCOPIC     I reviewed labs and they are notable for lactic acidosis  4.4  EKG  ED ECG REPORT I, Pilar Jarvis, the attending physician, personally viewed and interpreted this ECG.   Date: 05/28/2022  EKG Time: 1522  Rate: 124  Rhythm: sinus tachycardia  Axis: nl  Intervals:[id number]  ST&T Change: TWI inferior leads    RADIOLOGY I independently reviewed and interpreted CT of the abdomen pelvis to see inflammatory changes in the left lower quadrant   PROCEDURES:  Critical Care performed: Yes, see critical care procedure note(s)  .Critical Care  Performed by: Pilar Jarvis, MD Authorized by: Pilar Jarvis, MD   Critical care provider statement:    Critical care time (minutes):  30   Critical care was necessary to treat or prevent imminent or life-threatening deterioration of the following conditions:  Sepsis   Critical care was time spent personally by me on the following activities:  Development of treatment plan with patient or surrogate, discussions with consultants, evaluation of patient's response to treatment, examination of patient, ordering and review of laboratory studies, ordering and review of  radiographic studies, ordering and performing treatments and interventions, pulse oximetry, re-evaluation of patient's condition and review of old charts    MEDICATIONS ORDERED IN ED: Medications  vancomycin (VANCOREADY) IVPB 1750 mg/350 mL (1,750 mg Intravenous New Bag/Given 05/28/22 2039)  sodium chloride 0.9 % bolus 2,000 mL (2,000 mLs Intravenous New Bag/Given 05/28/22 1853)  ceFEPIme (MAXIPIME) 2 g in sodium chloride 0.9 % 100 mL IVPB (0 g Intravenous Stopped 05/28/22 1926)  ondansetron (ZOFRAN) injection 4 mg (4 mg Intravenous Given 05/28/22 1854)  morphine (PF) 4 MG/ML injection 4 mg (4 mg Intravenous Given 05/28/22 1854)  diazepam (VALIUM) tablet 10 mg (10 mg Oral Given 05/28/22 1845)  iohexol (OMNIPAQUE) 300 MG/ML solution 100 mL (100 mLs Intravenous Contrast Given 05/28/22 1946)    Consultants:  I spoke with hospitalist about admission and regarding care plan for this patient.   IMPRESSION / MDM / ASSESSMENT AND PLAN / ED COURSE  I reviewed the triage vital signs and the nursing notes.                              Differential diagnosis includes, but is not limited to, intra-abdominal infection like diverticulitis, appendicitis, cholecystitis, urine infection, kidney stone, obstruction, pancreatitis-sepsis w  tachycardia, lactic acidosis.   The patient is on the cardiac monitor to evaluate for evidence of arrhythmia and/or significant heart rate changes.  MDM: This patient with abdominal pain nausea and vomiting with a tender abdomen concern for intra-abdominal infection and sepsis given tachycardia and lactic acidosis, initially ordered for sepsis bolus 30 cc per ideal body weight of normal saline via the IV and broad-spectrum antibiotics vancomycin and cefepime.  Labs including blood culture urinalysis, and a CT scan of the abdomen and pelvis.  IV medications including IV morphine for pain and IV antiemetic.  He is a daily drinker and has not drank for 1 day, tachycardia may  be due to sepsis versus alcohol withdrawal although he is exhibiting no other clinical signs of alcohol withdrawal, I will give him p.o. Valium at this time for anxiety.  Diverticulitis on CT scan, uncomplicated without signs of perforation or abscess formation.  Patient's lactic downtrending.  Blood pressure remains normotensive and tachycardia has now improved markedly to the 90s from 120s after antibiotics and fluids.  Patient without signs of acute alcohol withdrawal at this time though he is at risk due to his daily drinking.  Continue to monitor.  Admit   Patient's presentation is most consistent with acute presentation with potential threat to life or bodily function.       FINAL CLINICAL IMPRESSION(S) / ED DIAGNOSES   Final diagnoses:  Acute left lower quadrant pain  Nausea and vomiting, unspecified vomiting type  Alcohol use  Diverticulitis     Rx / DC Orders   ED Discharge Orders     None        Note:  This document was prepared using Dragon voice recognition software and may include unintentional dictation errors.    Lucillie Garfinkel, MD 05/28/22 2051

## 2022-05-28 NOTE — Subjective & Objective (Signed)
Ryan Rose is a 61 y.o. male past medical history of hypertension and arthritis who presents to the emergency department today with nausea and vomiting ongoing for several weeks and some left lower quadrant discomfort that turned now severe this morning in the left lower quadrant. Denies fever or chills, no blood in the stools or melena -has been passing flatus and making daily bowel movements formed and brown.

## 2022-05-28 NOTE — H&P (Signed)
History and Physical    Ryan Rose WVP:[id number] DOB: 04/06/1961 DOA: 05/28/2022  DOS: the patient was seen and examined on 05/28/2022  PCP: Sherron Monday, MD   Patient coming from: Home  I have personally briefly reviewed patient's old medical records in [facility]  Ryan Rose is a 61 y.o. male past medical history of hypertension and arthritis who presents to the emergency department today with nausea and vomiting ongoing for several weeks and some left lower quadrant discomfort that turned now severe this morning in the left lower quadrant. Denies fever or chills, no blood in the stools or melena -has been passing flatus and making daily bowel movements formed and brown.   ED Course: 98.2  108/70HR 100  RR 18 EDP exam - tenderness on exam LLQ. Lab WBC 8.3, Lactic acid 4.4 which came down to 2.8. Code sepsis initiated by EDP - patient given cefepime and vancomycin. TRH  called to admit for continue treatment.   Review of Systems:  Review of Systems  Constitutional:  Negative for chills, fever and weight loss.  HENT: Negative.    Eyes: Negative.   Respiratory: Negative.    Cardiovascular: Negative.   Gastrointestinal:  Positive for abdominal pain.       Abdominal pain LLQ  Genitourinary: Negative.   Musculoskeletal: Negative.   Skin: Negative.   Neurological: Negative.   Endo/Heme/Allergies: Negative.   Psychiatric/Behavioral: Negative.      Past Medical History:  Diagnosis Date   Arthritis    Elevated blood pressure reading    History of methicillin resistant staphylococcus aureus (MRSA)     Past Surgical History:  Procedure Laterality Date   CERVICAL LAMINOPLASTY  08/20/2021   right C3-C6   COLONOSCOPY     SKIN GRAFT     to hand and forearm    Soc Hx -  married 37 years (together 48), 1 son, 6 grandchildren. Lives with his wife. Daily drinker   reports that he has been smoking cigarettes. He has never used smokeless tobacco. He reports  current alcohol use. He reports that he does not use drugs.  Allergies  Allergen Reactions   Sulfa Antibiotics Hives    History reviewed. No pertinent family history.  Prior to Admission medications   Medication Sig Start Date End Date Taking? Authorizing Provider  atorvastatin (LIPITOR) 10 MG tablet Take 1 tablet (10 mg total) by mouth every evening. 05/13/22  Yes   acetaminophen (TYLENOL) 500 MG tablet Take one or two tablet by mouth every 6  hours as needed for pain 05/13/22     aspirin 81 MG chewable tablet Chew 81 mg by mouth daily as needed.    [provider]  methocarbamol (ROBAXIN) 500 MG tablet Take 1 tablet (500 mg total) by mouth every 6 (six) hours as needed for muscle spasms. 08/22/21   [name], PA  traMADol Janean Sark) 50 MG tablet 1 every 6 hours prn 05/13/22       Physical Exam: Vitals:   05/28/22 2215 05/28/22 2241 05/28/22 2243 05/28/22 2300  BP: 117/77 108/70  116/75  Pulse: 99 100 100 97  Resp: 18 18  16   Temp:      TempSrc:      SpO2: 93% (!) 88% 97% 96%    Physical Exam Vitals and nursing note reviewed.  Constitutional:      General: He is not in acute distress.    Appearance: He is well-developed and normal weight. He is not  ill-appearing.  HENT:     Head: Normocephalic and atraumatic.     Mouth/Throat:     Mouth: Mucous membranes are moist.  Eyes:     General: No scleral icterus.    Extraocular Movements: Extraocular movements intact.     Pupils: Pupils are equal, round, and reactive to light.  Cardiovascular:     Rate and Rhythm: Regular rhythm. Tachycardia present.     Heart sounds: No murmur heard. Pulmonary:     Effort: Pulmonary effort is normal.     Breath sounds: Normal breath sounds.  Abdominal:     General: Abdomen is protuberant. Bowel sounds are normal. There is no distension or abdominal bruit.     Palpations: Abdomen is soft. There is no shifting dullness.     Tenderness: There is abdominal tenderness in the left  lower quadrant. There is no guarding or rebound.     Hernia: No hernia is present.  Skin:    General: Skin is warm and dry.  Neurological:     General: No focal deficit present.     Mental Status: He is alert and oriented to person, place, and time.  Psychiatric:        Mood and Affect: Mood normal.        Behavior: Behavior normal.      Labs on Admission: I have personally reviewed following labs and imaging studies  CBC: Recent Labs  Lab 05/28/22 1517  WBC 8.3  HGB 13.1  HCT 38.0*  MCV 103.5*  PLT 175   Basic Metabolic Panel: Recent Labs  Lab 05/28/22 1840  NA 134*  K 3.9  CL 99  CO2 20*  GLUCOSE 91  BUN 8  CREATININE 0.57*  CALCIUM 8.8*   GFR: Estimated Creatinine Clearance: 94.3 mL/min (A) (by C-G formula based on SCr of 0.57 mg/dL (L)). Liver Function Tests: Recent Labs  Lab 05/28/22 1840  AST 125*  ALT 39  ALKPHOS 89  BILITOT 2.2*  PROT 8.1  ALBUMIN 4.1   Recent Labs  Lab 05/28/22 1840  LIPASE 27   No results for input(s): "AMMONIA" in the last 168 hours. Coagulation Profile: Recent Labs  Lab 05/28/22 1840  INR 1.0   Cardiac Enzymes: No results for input(s): "CKTOTAL", "CKMB", "CKMBINDEX", "TROPONINI" in the last 168 hours. BNP (last 3 results) No results for input(s): "PROBNP" in the last 8760 hours. HbA1C: No results for input(s): "HGBA1C" in the last 72 hours. CBG: No results for input(s): "GLUCAP" in the last 168 hours. Lipid Profile: No results for input(s): "CHOL", "HDL", "LDLCALC", "TRIG", "CHOLHDL", "LDLDIRECT" in the last 72 hours. Thyroid Function Tests: No results for input(s): "TSH", "T4TOTAL", "FREET4", "T3FREE", "THYROIDAB" in the last 72 hours. Anemia Panel: No results for input(s): "VITAMINB12", "FOLATE", "FERRITIN", "TIBC", "IRON", "RETICCTPCT" in the last 72 hours. Urine analysis:    Component Value Date/Time   COLORURINE AMBER (A) 08/06/2021 1038   APPEARANCEUR HAZY (A) 08/06/2021 1038   LABSPEC 1.021  08/06/2021 1038   PHURINE 5.0 08/06/2021 1038   GLUCOSEU NEGATIVE 08/06/2021 1038   HGBUR NEGATIVE 08/06/2021 1038   BILIRUBINUR NEGATIVE 08/06/2021 1038   KETONESUR NEGATIVE 08/06/2021 1038   PROTEINUR 30 (A) 08/06/2021 1038   NITRITE NEGATIVE 08/06/2021 1038   LEUKOCYTESUR NEGATIVE 08/06/2021 1038    Radiological Exams on Admission: I have personally reviewed images CT ABDOMEN PELVIS W CONTRAST  Result Date: 05/28/2022 CLINICAL DATA:  Left lower quadrant abdominal pain EXAM: CT ABDOMEN AND PELVIS WITH CONTRAST TECHNIQUE: Multidetector CT imaging  of the abdomen and pelvis was performed using the standard protocol following bolus administration of intravenous contrast. RADIATION DOSE REDUCTION: This exam was performed according to the departmental dose-optimization program which includes automated exposure control, adjustment of the mA and/or kV according to patient size and/or use of iterative reconstruction technique. CONTRAST:  OMNIPAQUE IOHEXOL 300 MG/ML  SOLN COMPARISON:  None Available. FINDINGS: Lower chest: No focal consolidation or pulmonary nodule in the lung bases. Bilateral lower lobe subpleural scarring. No pleural effusion or pneumothorax demonstrated. Partially imaged heart size is normal. Coronary artery calcifications. Hepatobiliary: Diffuse parenchymal hypoattenuation in keeping with steatosis. No focal lesions. No intra or extrahepatic biliary ductal dilation. Normal gallbladder. Pancreas: No focal lesions. Mild prominence of the main duct in the pancreatic head measuring 4 mm. Spleen: Normal in size without focal abnormality. Adrenals/Urinary Tract: No adrenal nodules. No focal renal lesions, calculi or hydronephrosis. Bilateral perinephric stranding, nonspecific. No focal bladder wall thickening. Stomach/Bowel: Normal appearance of the stomach. Colonic diverticulosis. Centered within the sigmoid colon, there is an area of mural thickening and pericolonic stranding associated  with a thick-walled saccular outpouching. A diverticulum in this area closely abuts the dome of the bladder (6:69). Diffuse mural hypoattenuation of the ascending colon is nonspecific, however can be seen in setting of prior inflammation. Normal appendix. Vascular/Lymphatic: Aortic atherosclerosis. 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 No enlarged abdominal or pelvic lymph nodes. Reproductive: Prostate is unremarkable. Other: Left lower quadrant peritoneal thickening adjacent to the sigmoid colon inflammation. Musculoskeletal: No acute or abnormal lytic or blastic osseous lesions. Multilevel degenerative changes of the partially imaged thoracic and lumbar spine. Partially imaged 16 mm inclusion cyst along the left posterior thoracic region at the level of the eighth rib (2:2). IMPRESSION: 1. Acute sigmoid diverticulitis involving a large diverticulum with adjacent peritoneal thickening, likely reactive peritonitis. The medial aspect this diverticulitis closely abuts the dome of the bladder, which may predispose to fistula formation. Attention on follow-up. 2. 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 Recommend follow-up every 12 months and vascular consultation. This recommendation follows ACR consensus guidelines: White Paper of the ACR Incidental Findings Committee II on Vascular Findings. J Am Coll Radiol 2013; 10:789-794. 3. Hepatic steatosis. 4. Coronary artery calcifications. Aortic Atherosclerosis (ICD10-I70.0). Electronically Signed   By: Agustin Cree M.D.   On: 05/28/2022 20:31   DG Cervical Spine 2 or 3 views  Result Date: 05/28/2022 CLINICAL DATA:  Prior laminoplasty.  Cervical myelopathy. EXAM: CERVICAL SPINE - 2-3 VIEW COMPARISON:  MRI cervical spine 05/20/2021 FINDINGS: Mild retrolisthesis  C3-4 and C4-5.  Negative for fracture or mass. Moderate disc degeneration and spurring C3-4, C4-5, C5-6, C6-7. Surgical screws and plates in the lamina on the right at C3, C4, C5, C6. Hardware in satisfactory position. IMPRESSION: Cervical spondylosis Surgical screws and plates in the lamina on the right of C3 through C6. Electronically Signed   By: Marlan Palau M.D.   On: 05/28/2022 18:49   DG Chest Port 1 View  Result Date: 05/28/2022 CLINICAL DATA:  Question sepsis EXAM: PORTABLE CHEST 1 VIEW COMPARISON:  Chest 02/18/2019 FINDINGS: The heart size and mediastinal contours are within normal limits. Both lungs are clear. The visualized skeletal structures are unremarkable. IMPRESSION: No active disease. Electronically Signed   By: Marlan Palau M.D.   On: 05/28/2022 18:44    EKG:  I have personally reviewed EKG: sinus tachycardia, no acute changes/no STEMI  Assessment/Plan Principal Problem:   Sigmoid diverticulitis Active Problems:   Sepsis ([place])   Alcohol abuse    Assessment and Plan: * Sigmoid diverticulitis Patient with progressive LLQ abdominal pain. Evaluation reveals sigmoid diverticulitis. Patient afebrile, no leukocytosis. However at admission he was tachycardic, lactic acid was elevated thus a candidate for abx  Plan Obs admission  Zosyn q8  Pain control  Alcohol abuse Patient reports that he imbibes 3 oz daily of unbonded liquor. He reports that he has become tremulous when he stops drinking but denies full-blown withdraw: no agitation, restlessness, hallucinations.  Plan  CIWA protocol.  Sepsis ([place]) Patient presents to [facility] with LLQ abdominal, tenderness on exam. He was tachycardic and lactic acid was initially elevated c/w early sepsis. In ED he was given cefepime and Vancomycin. Repeat lactic acid improved to 2.8  Plan Abx treatment of diveriticulitis       DVT prophylaxis: Lovenox Code Status: Full Code Family Communication: tried calling wife - no  answer  Disposition Plan: home 24-48 hrs  Consults called: none  Admission status: Observation, Med-Surg   Adella Hare, MD [facility] 05/28/2022, 11:49 PM

## 2022-05-28 NOTE — ED Notes (Signed)
Pt placed on 2Lvia nasal cannula of O2 due to O2 sat decreasing to 88%. O2 sat increased to 95% with o2

## 2022-05-28 NOTE — ED Notes (Signed)
ED Provider at bedside. 

## 2022-05-28 NOTE — Consult Note (Signed)
PHARMACY -  BRIEF ANTIBIOTIC NOTE   Pharmacy has received consult(s) for sepsis from an ED provider.  The patient's profile has been reviewed for ht/wt/allergies/indication/available labs.    One time order(s) placed for vancomycin  Further antibiotics/pharmacy consults should be ordered by admitting physician if indicated.                       Thank you, Oswald Hillock 05/28/2022  6:27 PM

## 2022-05-28 NOTE — ED Notes (Signed)
Patient transported to CT 

## 2022-05-28 NOTE — ED Notes (Signed)
Pt brought to ED rm 16 at this time, this RN now assuming care. 

## 2022-05-28 NOTE — ED Triage Notes (Signed)
Patient arrived by [facility] from home with c/o LLQ pain X2 weeks. Progressively gotten worse. C/o chills. Reports decreased mobility  EMS vitals: 98.3oral 98CBG 103HR 112/63 b/p 98% RA  20G R AC  Given 150cc fluid by EMS

## 2022-05-28 NOTE — ED Provider Triage Note (Signed)
Emergency Medicine Provider Triage Evaluation Note  Ryan Rose , a 61 y.o. male  was evaluated in triage.  Pt complains of sharp 9 out of 10 left lower quadrant abdominal pain.  Pain has been constant today patient also complains of vomiting every morning for the last 2 weeks.  No other nausea or vomiting throughout the day.  Denies any fevers.  Patient states he had abdominal imaging last week from PCPs office but no documentation of this in our system or care everywhere.  Patient denies any fevers no trauma or injury.  No diarrhea.  Review of Systems  Positive: Left lower quadrant abdominal pain, nausea, vomiting Negative: Fevers, chest pain, shortness of breath, fall  Physical Exam  BP 114/74 (BP Location: Left Arm)   Pulse (!) 126   Temp 97.7 F (36.5 C) (Oral)   Resp 20   SpO2 93%  Gen:   Awake, no distress   Resp:  Normal effort  MSK:   Moves extremities without difficulty  Other:  Point tender left lower quadrant with guarding and grimacing  Medical Decision Making  Medically screening exam initiated at 3:28 PM.  Appropriate orders placed.  Donato Schultz was informed that the remainder of the evaluation will be completed by another provider, this initial triage assessment does not replace that evaluation, and the importance of remaining in the ED until their evaluation is complete.     Duanne Guess, [place] 05/28/22 1530

## 2022-05-28 NOTE — Progress Notes (Signed)
DOS: C3-6 laminoplasty on 08/20/21 with Dr. Izora Ribas  HISTORY OF PRESENT ILLNESS: Ryan Rose is status post cervical laminoplasty for myelopathy.   He is to have symptoms of chronic myelopathy.  PHYSICAL EXAMINATION:  NEUROLOGICAL:  General: In no acute distress.  Awake, alert, oriented to person, place, and time. Pupils equal round and reactive to light. Facial tone is symmetric. Tongue protrusion is midline. There is no pronator drift.  Strength: Side Biceps Triceps Deltoid Interossei Grip Wrist Ext. Wrist Flex.  R 5 5 5 4 4  4- 4-  L 5 5 5 4 4  4- 4-   Incision c/d/I and healing well  Imaging:  No complications noted  Assessment / Plan: Ryan Rose is doing well after cervical laminoplasty.   He has chronic myelopathy. I think he will continue to improve slowly. I will see him back  as needed.   Meade Maw MD, [facility] Department of Neurosurgery

## 2022-05-28 NOTE — ED Notes (Signed)
Dr. Norins at bedside 

## 2022-05-28 NOTE — Assessment & Plan Note (Signed)
Patient presents to [facility] with LLQ abdominal, tenderness on exam. He was tachycardic and lactic acid was initially elevated c/w early sepsis. In ED he was given cefepime and Vancomycin. Repeat lactic acid improved to 2.8  Plan Abx treatment of diveriticulitis

## 2022-05-28 NOTE — Assessment & Plan Note (Signed)
Patient with progressive LLQ abdominal pain. Evaluation reveals sigmoid diverticulitis. Patient afebrile, no leukocytosis. However at admission he was tachycardic, lactic acid was elevated thus a candidate for abx  Plan Obs admission  Zosyn q8  Pain control

## 2022-05-28 NOTE — Assessment & Plan Note (Signed)
Patient reports that he imbibes 3 oz daily of unbonded liquor. He reports that he has become tremulous when he stops drinking but denies full-blown withdraw: no agitation, restlessness, hallucinations.  Plan  CIWA protocol.

## 2022-05-29 DIAGNOSIS — E871 Hypo-osmolality and hyponatremia: Secondary | ICD-10-CM | POA: Diagnosis present

## 2022-05-29 DIAGNOSIS — I714 Abdominal aortic aneurysm, without rupture, unspecified: Secondary | ICD-10-CM | POA: Diagnosis present

## 2022-05-29 DIAGNOSIS — Z789 Other specified health status: Secondary | ICD-10-CM | POA: Diagnosis not present

## 2022-05-29 DIAGNOSIS — Z7982 Long term (current) use of aspirin: Secondary | ICD-10-CM | POA: Diagnosis not present

## 2022-05-29 DIAGNOSIS — K5792 Diverticulitis of intestine, part unspecified, without perforation or abscess without bleeding: Secondary | ICD-10-CM | POA: Diagnosis not present

## 2022-05-29 DIAGNOSIS — F1010 Alcohol abuse, uncomplicated: Secondary | ICD-10-CM

## 2022-05-29 DIAGNOSIS — D696 Thrombocytopenia, unspecified: Secondary | ICD-10-CM | POA: Diagnosis present

## 2022-05-29 DIAGNOSIS — D539 Nutritional anemia, unspecified: Secondary | ICD-10-CM | POA: Diagnosis present

## 2022-05-29 DIAGNOSIS — Z882 Allergy status to sulfonamides status: Secondary | ICD-10-CM | POA: Diagnosis not present

## 2022-05-29 DIAGNOSIS — Z79899 Other long term (current) drug therapy: Secondary | ICD-10-CM | POA: Diagnosis not present

## 2022-05-29 DIAGNOSIS — K659 Peritonitis, unspecified: Secondary | ICD-10-CM | POA: Diagnosis present

## 2022-05-29 DIAGNOSIS — E860 Dehydration: Secondary | ICD-10-CM | POA: Diagnosis present

## 2022-05-29 DIAGNOSIS — I10 Essential (primary) hypertension: Secondary | ICD-10-CM | POA: Diagnosis present

## 2022-05-29 DIAGNOSIS — R1032 Left lower quadrant pain: Secondary | ICD-10-CM

## 2022-05-29 DIAGNOSIS — I7143 Infrarenal abdominal aortic aneurysm, without rupture: Secondary | ICD-10-CM | POA: Diagnosis not present

## 2022-05-29 DIAGNOSIS — A419 Sepsis, unspecified organism: Secondary | ICD-10-CM | POA: Diagnosis present

## 2022-05-29 DIAGNOSIS — Z87891 Personal history of nicotine dependence: Secondary | ICD-10-CM | POA: Diagnosis not present

## 2022-05-29 DIAGNOSIS — R7401 Elevation of levels of liver transaminase levels: Secondary | ICD-10-CM | POA: Diagnosis present

## 2022-05-29 DIAGNOSIS — K5732 Diverticulitis of large intestine without perforation or abscess without bleeding: Secondary | ICD-10-CM | POA: Diagnosis not present

## 2022-05-29 LAB — URINALYSIS, ROUTINE W REFLEX MICROSCOPIC
Bacteria, UA: NONE SEEN
Bilirubin Urine: NEGATIVE
Glucose, UA: NEGATIVE mg/dL
Hgb urine dipstick: NEGATIVE
Ketones, ur: 20 mg/dL — AB
Leukocytes,Ua: NEGATIVE
Nitrite: NEGATIVE
Protein, ur: 30 mg/dL — AB
Specific Gravity, Urine: >1.046 — ABNORMAL HIGH (ref 1.005–1.030)
pH: 5 (ref 5.0–8.0)

## 2022-05-29 LAB — COMPREHENSIVE METABOLIC PANEL
ALT: 31 U/L (ref 0–44)
AST: 93 U/L — ABNORMAL HIGH (ref 15–41)
Albumin: 3.3 g/dL — ABNORMAL LOW (ref 3.5–5.0)
Alkaline Phosphatase: 71 U/L (ref 38–126)
Anion gap: 10 (ref 5–15)
BUN: 7 mg/dL — ABNORMAL LOW (ref 8–23)
CO2: 23 mmol/L (ref 22–32)
Calcium: 8.2 mg/dL — ABNORMAL LOW (ref 8.9–10.3)
Chloride: 101 mmol/L (ref 98–111)
Creatinine, Ser: 0.52 mg/dL — ABNORMAL LOW (ref 0.61–1.24)
GFR, Estimated: >60 mL/min (ref >60)
Glucose, Bld: 94 mg/dL (ref 70–99)
Potassium: 4.1 mmol/L (ref 3.5–5.1)
Sodium: 134 mmol/L — ABNORMAL LOW (ref 135–145)
Total Bilirubin: 2.6 mg/dL — ABNORMAL HIGH (ref 0.3–1.2)
Total Protein: 6.7 g/dL (ref 6.5–8.1)

## 2022-05-29 LAB — CBC
HCT: 34.1 % — ABNORMAL LOW (ref 39.0–52.0)
Hemoglobin: 11.8 g/dL — ABNORMAL LOW (ref 13.0–17.0)
MCH: 35.8 pg — ABNORMAL HIGH (ref 26.0–34.0)
MCHC: 34.6 g/dL (ref 30.0–36.0)
MCV: 103.3 fL — ABNORMAL HIGH (ref 80.0–100.0)
Platelets: 134 10*3/uL — ABNORMAL LOW (ref 150–400)
RBC: 3.3 MIL/uL — ABNORMAL LOW (ref 4.22–5.81)
RDW: 12.7 % (ref 11.5–15.5)
WBC: 6.2 10*3/uL (ref 4.0–10.5)
nRBC: 0 % (ref 0.0–0.2)

## 2022-05-29 LAB — VITAMIN B12: Vitamin B-12: 405 pg/mL (ref 180–914)

## 2022-05-29 LAB — FOLATE: Folate: 30 ng/mL (ref >5.9)

## 2022-05-29 MED ORDER — LACTATED RINGERS IV SOLN: INTRAVENOUS | Status: AC

## 2022-05-29 NOTE — Progress Notes (Addendum)
[facility]                                                                              Kylle Lall, is a 61 y.o. male, DOB - 1961/06/20, FSF:[id number] Admit date - 05/28/2022    Outpatient Primary MD for the patient is Jodi Marble, MD  LOS - 0  days  Chief Complaint  Patient presents with   Abdominal Pain   Emesis       Brief summary   Patient is a 61 year old male with history of hypertension, arthritis presented with ongoing nausea and vomiting for several weeks with the left lower quadrant discomfort.  No fevers or chills, no hematochezia or melena, no hematemesis. In ED, BP 108/70, heart rate 100, respirate 18 temp 98.2 WBCs 8.3, lactic acid 4.4,  CT abdomen pelvis showed acute sigmoid diverticulitis involving a large diverticulum with adjacent peritoneal thickening, likely reactive peritonitis.  The medial aspect of diverticulitis closely abuts the dome of the bladder which may predispose to fistula formation, attention on follow-up.  Incidental 4.4 into 3.2 cm irregular thrombosed saccular aneurysm of infrarenal abdominal aorta   Assessment & Plan    Principal Problem: Sepsis secondary to acute sigmoid diverticulitis -Patient met sepsis criteria with tachycardia, lactic acidosis, borderline BP, source secondary to sigmoid diverticulitis -CT abdomen pelvis with acute sigmoid diverticulitis -Continue IV fluids, IV Zosyn, pain control -Full liquid diet -Recommended outpatient follow-up with repeat CT abdomen after antibiotic course is completed  Active Problems:    Alcohol abuse -Continue CIWA with Ativan -Continue thiamine, folate, MVI, counseled strongly on alcohol cessation    Hyponatremia, lactic acidosis -Likely due to nausea and vomiting, dehydration, continue IV fluid hydration    Transaminitis -AST elevated, likely due to alcohol abuse, improving    Macrocytic anemia, thrombocytopenia -Likely due to alcohol abuse -Obtain  pain B12, folate  AAA, infrarenal without rupture, incidental finding -CT abdomen incidentally showed 4.4x 3.2 cm saccular aneurysm of infrarenal abdominal aorta -Recommended follow-up every 12 months and vascular consultation (will place ambulatory referral on discharge).   Code Status: Full code DVT Prophylaxis:  enoxaparin (LOVENOX) injection 40 mg Start: 05/29/22 0800   Level of Care: Level of care: Med-Surg Family Communication: Updated patient, alert and oriented   Disposition Plan:      Remains inpatient appropriate: Hopefully DC home in next 24 to 48 hours if abdominal pain improved and tolerating diet   Procedures:  None  Consultants:   None  Antimicrobials:   Anti-infectives (From admission, onward)    Start     Dose/Rate Route Frequency Ordered Stop   05/29/22 0200  piperacillin-tazobactam (ZOSYN) IVPB 3.375 g        3.375 g 12.5 mL/hr over 240 Minutes Intravenous Every 8 hours 05/28/22 2354     05/28/22 2345  piperacillin-tazobactam (ZOSYN) IVPB 3.375 g  Status:  Discontinued        3.375 g 100 mL/hr over 30 Minutes Intravenous Every 8 hours 05/28/22 2341 05/28/22 2354   05/28/22 1830  ceFEPIme (MAXIPIME) 2 g in sodium chloride 0.9 % 100 mL IVPB        2 g  200 mL/hr over 30 Minutes Intravenous  Once 05/28/22 1823 05/28/22 1926   05/28/22 1830  vancomycin (VANCOREADY) IVPB 1750 mg/350 mL        1,750 mg 175 mL/hr over 120 Minutes Intravenous  Once 05/28/22 1827 05/28/22 2241          Medications  aspirin  81 mg Oral Daily   atorvastatin  10 mg Oral QPM   enoxaparin (LOVENOX) injection  40 mg Subcutaneous J00X   folic acid  1 mg Oral Daily   LORazepam  0-4 mg Oral Q6H   Followed by   Derrill Memo ON 05/30/2022] LORazepam  0-4 mg Oral Q12H   melatonin  2.5 mg Oral QHS   multivitamin with minerals  1 tablet Oral Daily   senna  1 tablet Oral BID   thiamine  100 mg Oral Daily   Or   thiamine  100 mg Intravenous Daily      Subjective:   Ryan Rose was seen and examined today.  Still having the left lower quadrant abdominal pain, no acute nausea or vomiting, no fevers or chills.  Objective:   Vitals:   05/29/22 0815 05/29/22 0900 05/29/22 0943 05/29/22 1034  BP: 138/72 (!) 131/94  (!) 131/90  Pulse: 96 89  90  Resp:  20  19  Temp:  98 F (36.7 C)  99.3 F (37.4 C)  TempSrc:  Oral  Oral  SpO2:  92%  94%  Weight:   74.4 kg   Height:   5' 8.5" (1.74 m)     Intake/Output Summary (Last 24 hours) at 05/29/2022 1107 Last data filed at 05/28/2022 2241 Gross per 24 hour  Intake 2450 ml  Output --  Net 2450 ml     Wt Readings from Last 3 Encounters:  05/29/22 74.4 kg  05/28/22 74.4 kg  08/20/21 74.8 kg     Exam General: Alert and oriented x 3, NAD Cardiovascular: S1 S2 auscultated,  RRR Respiratory: Clear to auscultation bilaterally, no wheezing Gastrointestinal: Soft, LLQ TTP, NBS, ND Ext: no pedal edema bilaterally Neuro: Strength 5/5 upper and lower extremities bilaterally Psych: Normal affect and demeanor, alert and oriented x3     Data Reviewed:  I have personally reviewed following labs    CBC Lab Results  Component Value Date   WBC 6.2 05/29/2022   RBC 3.30 (L) 05/29/2022   HGB 11.8 (L) 05/29/2022   HCT 34.1 (L) 05/29/2022   MCV 103.3 (H) 05/29/2022   MCH 35.8 (H) 05/29/2022   PLT 134 (L) 05/29/2022   MCHC 34.6 05/29/2022   RDW 12.7 38/18/2993     Last metabolic panel Lab Results  Component Value Date   NA 134 (L) 05/29/2022   K 4.1 05/29/2022   CL 101 05/29/2022   CO2 23 05/29/2022   BUN 7 (L) 05/29/2022   CREATININE 0.52 (L) 05/29/2022   GLUCOSE 94 05/29/2022   GFRNONAA >60 05/29/2022   GFRAA >60 02/18/2019   CALCIUM 8.2 (L) 05/29/2022   PROT 6.7 05/29/2022   ALBUMIN 3.3 (L) 05/29/2022   BILITOT 2.6 (H) 05/29/2022   ALKPHOS 71 05/29/2022   AST 93 (H) 05/29/2022   ALT 31 05/29/2022   ANIONGAP 10 05/29/2022    CBG (last 3)  No results for input(s): "GLUCAP" in the last  72 hours.    Coagulation Profile: Recent Labs  Lab 05/28/22 1840  INR 1.0     Radiology Studies: I have personally reviewed the imaging studies  CT ABDOMEN PELVIS  W CONTRAST  Result Date: 05/28/2022 CLINICAL DATA:  Left lower quadrant abdominal pain EXAM: CT ABDOMEN AND PELVIS WITH CONTRAST TECHNIQUE: Multidetector CT imaging of the abdomen and pelvis was performed using the standard protocol following bolus administration of intravenous contrast. RADIATION DOSE REDUCTION: This exam was performed according to the departmental dose-optimization program which includes automated exposure control, adjustment of the mA and/or kV according to patient size and/or use of iterative reconstruction technique. CONTRAST:  186m OMNIPAQUE IOHEXOL 300 MG/ML  SOLN COMPARISON:  None Available. FINDINGS: Lower chest: No focal consolidation or pulmonary nodule in the lung bases. Bilateral lower lobe subpleural scarring. No pleural effusion or pneumothorax demonstrated. Partially imaged heart size is normal. Coronary artery calcifications. Hepatobiliary: Diffuse parenchymal hypoattenuation in keeping with steatosis. No focal lesions. No intra or extrahepatic biliary ductal dilation. Normal gallbladder. Pancreas: No focal lesions. Mild prominence of the main duct in the pancreatic head measuring 4 mm. Spleen: Normal in size without focal abnormality. Adrenals/Urinary Tract: No adrenal nodules. No focal renal lesions, calculi or hydronephrosis. Bilateral perinephric stranding, nonspecific. No focal bladder wall thickening. Stomach/Bowel: Normal appearance of the stomach. Colonic diverticulosis. Centered within the sigmoid colon, there is an area of mural thickening and pericolonic stranding associated with a thick-walled saccular outpouching. A diverticulum in this area closely abuts the dome of the bladder (6:69). Diffuse mural hypoattenuation of the ascending colon is nonspecific, however can be seen in setting of prior  inflammation. Normal appendix. Vascular/Lymphatic: Aortic atherosclerosis. 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 No enlarged abdominal or pelvic lymph nodes. Reproductive: Prostate is unremarkable. Other: Left lower quadrant peritoneal thickening adjacent to the sigmoid colon inflammation. Musculoskeletal: No acute or abnormal lytic or blastic osseous lesions. Multilevel degenerative changes of the partially imaged thoracic and lumbar spine. Partially imaged 16 mm inclusion cyst along the left posterior thoracic region at the level of the eighth rib (2:2). IMPRESSION: 1. Acute sigmoid diverticulitis involving a large diverticulum with adjacent peritoneal thickening, likely reactive peritonitis. The medial aspect this diverticulitis closely abuts the dome of the bladder, which may predispose to fistula formation. Attention on follow-up. 2. 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 Recommend follow-up every 12 months and vascular consultation. This recommendation follows ACR consensus guidelines: White Paper of the ACR Incidental Findings Committee II on Vascular Findings. J Am Coll Radiol 2013; 10:789-794. 3. Hepatic steatosis. 4. Coronary artery calcifications. Aortic Atherosclerosis (ICD10-I70.0). Electronically Signed   By: LDarrin NipperM.D.   On: 05/28/2022 20:31   DG Cervical Spine 2 or 3 views  Result Date: 05/28/2022 CLINICAL DATA:  Prior laminoplasty.  Cervical myelopathy. EXAM: CERVICAL SPINE - 2-3 VIEW COMPARISON:  MRI cervical spine 05/20/2021 FINDINGS: Mild retrolisthesis C3-4 and C4-5.  Negative for fracture or mass. Moderate disc degeneration and spurring C3-4, C4-5, C5-6, C6-7. Surgical screws and plates in the lamina on the right at C3, C4, C5, C6. Hardware in satisfactory position.  IMPRESSION: Cervical spondylosis Surgical screws and plates in the lamina on the right of C3 through C6. Electronically Signed   By: CFranchot GalloM.D.   On: 05/28/2022 18:49   DG Chest Port 1 View  Result Date: 05/28/2022 CLINICAL DATA:  Question sepsis EXAM: PORTABLE CHEST 1 VIEW COMPARISON:  Chest 02/18/2019 FINDINGS: The heart size and mediastinal contours are within normal limits. Both lungs are clear. The visualized skeletal  structures are unremarkable. IMPRESSION: No active disease. Electronically Signed   By: Franchot Gallo M.D.   On: 05/28/2022 18:44         M.D. [facility] 05/29/2022, 11:07 AM  Available via Epic secure chat 7am-7pm After 7 pm, please refer to night coverage provider listed on amion.

## 2022-05-30 ENCOUNTER — Other Ambulatory Visit (HOSPITAL_COMMUNITY): Payer: Self-pay

## 2022-05-30 DIAGNOSIS — K5732 Diverticulitis of large intestine without perforation or abscess without bleeding: Secondary | ICD-10-CM | POA: Diagnosis not present

## 2022-05-30 DIAGNOSIS — R1032 Left lower quadrant pain: Secondary | ICD-10-CM | POA: Diagnosis not present

## 2022-05-30 DIAGNOSIS — I7143 Infrarenal abdominal aortic aneurysm, without rupture: Secondary | ICD-10-CM | POA: Diagnosis not present

## 2022-05-30 DIAGNOSIS — K5792 Diverticulitis of intestine, part unspecified, without perforation or abscess without bleeding: Secondary | ICD-10-CM | POA: Diagnosis not present

## 2022-05-30 LAB — CBC
HCT: 32.8 % — ABNORMAL LOW (ref 39.0–52.0)
Hemoglobin: 11.9 g/dL — ABNORMAL LOW (ref 13.0–17.0)
MCH: 36.4 pg — ABNORMAL HIGH (ref 26.0–34.0)
MCHC: 36.3 g/dL — ABNORMAL HIGH (ref 30.0–36.0)
MCV: 100.3 fL — ABNORMAL HIGH (ref 80.0–100.0)
Platelets: 147 10*3/uL — ABNORMAL LOW (ref 150–400)
RBC: 3.27 MIL/uL — ABNORMAL LOW (ref 4.22–5.81)
RDW: 12.1 % (ref 11.5–15.5)
WBC: 5.1 10*3/uL (ref 4.0–10.5)
nRBC: 0 % (ref 0.0–0.2)

## 2022-05-30 LAB — URINE CULTURE: Culture: NO GROWTH

## 2022-05-30 LAB — COMPREHENSIVE METABOLIC PANEL
ALT: 26 U/L (ref 0–44)
AST: 61 U/L — ABNORMAL HIGH (ref 15–41)
Albumin: 3.2 g/dL — ABNORMAL LOW (ref 3.5–5.0)
Alkaline Phosphatase: 68 U/L (ref 38–126)
Anion gap: 9 (ref 5–15)
BUN: <5 mg/dL — ABNORMAL LOW (ref 8–23)
CO2: 24 mmol/L (ref 22–32)
Calcium: 9 mg/dL (ref 8.9–10.3)
Chloride: 104 mmol/L (ref 98–111)
Creatinine, Ser: 0.59 mg/dL — ABNORMAL LOW (ref 0.61–1.24)
GFR, Estimated: >60 mL/min (ref >60)
Glucose, Bld: 104 mg/dL — ABNORMAL HIGH (ref 70–99)
Potassium: 3.4 mmol/L — ABNORMAL LOW (ref 3.5–5.1)
Sodium: 137 mmol/L (ref 135–145)
Total Bilirubin: 1.4 mg/dL — ABNORMAL HIGH (ref 0.3–1.2)
Total Protein: 6.5 g/dL (ref 6.5–8.1)

## 2022-05-30 LAB — HIV ANTIBODY (ROUTINE TESTING W REFLEX): HIV Screen 4th Generation wRfx: NONREACTIVE

## 2022-05-30 MED ORDER — VITAMIN B-1 100 MG PO TABS
100.0000 mg | ORAL_TABLET | Freq: Every day | ORAL | 0 refills | Status: DC
  Filled 2022-05-30: qty 100, 100d supply, fill #0

## 2022-05-30 MED ORDER — AMOXICILLIN-POT CLAVULANATE 875-125 MG PO TABS
1.0000 | ORAL_TABLET | Freq: Two times a day (BID) | ORAL | Status: DC
  Administered 2022-05-30: 1 via ORAL
  Filled 2022-05-30: qty 1

## 2022-05-30 MED ORDER — POTASSIUM CHLORIDE CRYS ER 20 MEQ PO TBCR
40.0000 meq | EXTENDED_RELEASE_TABLET | Freq: Once | ORAL | Status: AC
  Administered 2022-05-30: 40 meq via ORAL
  Filled 2022-05-30: qty 2

## 2022-05-30 MED ORDER — ONDANSETRON HCL 4 MG PO TABS
4.0000 mg | ORAL_TABLET | Freq: Three times a day (TID) | ORAL | 0 refills | Status: DC | PRN
  Filled 2022-05-30 (×2): qty 30, 10d supply, fill #0

## 2022-05-30 MED ORDER — SENNOSIDES-DOCUSATE SODIUM 8.6-50 MG PO TABS
2.0000 | ORAL_TABLET | Freq: Every day | ORAL | 3 refills | Status: DC
  Filled 2022-05-30: qty 60, 30d supply, fill #0

## 2022-05-30 MED ORDER — TRAMADOL HCL 50 MG PO TABS
50.0000 mg | ORAL_TABLET | Freq: Four times a day (QID) | ORAL | 0 refills | Status: DC | PRN
  Filled 2022-05-30: qty 20, 5d supply, fill #0

## 2022-05-30 MED ORDER — AMOXICILLIN-POT CLAVULANATE 875-125 MG PO TABS
1.0000 | ORAL_TABLET | Freq: Two times a day (BID) | ORAL | 0 refills | Status: AC
  Filled 2022-05-30 (×2): qty 28, 14d supply, fill #0

## 2022-05-30 NOTE — Discharge Summary (Signed)
Physician Discharge Summary   Patient: Ryan Rose MRN: [id number] DOB: Oct 25, 1960  Admit date:     05/28/2022  Discharge date: 05/30/22  Discharge Physician: Estill Cotta, MD    PCP: Jodi Marble, MD   Recommendations at discharge:   Continue Augmentin 875-125 mg 1 tab p.o. twice daily for 14 days Recommend repeat CT abdomen in 2 weeks to ensure complete resolution of acute diverticulitis Also recommended GI referral for screening colonoscopy in 2 months to ensure complete resolution of diverticulitis with no residual complication Incidental finding of infrarenal AAA-Recommended follow-up every 12 months and vascular consultation (ambulatory referral placed on discharge).  Discharge Diagnoses:    Sigmoid diverticulitis   Sepsis ([place])   Alcohol abuse   Hyponatremia   Transaminitis Lactic acidosis   Macrocytic anemia   AAA (abdominal aortic aneurysm) [facility])    Hospital Course:  Patient is a 61 year old male with history of hypertension, arthritis presented with ongoing nausea and vomiting for several weeks with the left lower quadrant discomfort.  No fevers or chills, no hematochezia or melena, no hematemesis. In ED, BP 108/70, heart rate 100, respirate 18 temp 98.2 WBCs 8.3, lactic acid 4.4,   CT abdomen pelvis showed acute sigmoid diverticulitis involving a large diverticulum with adjacent peritoneal thickening, likely reactive peritonitis.  The medial aspect of diverticulitis closely abuts the dome of the bladder which may predispose to fistula formation, attention on follow-up.  Incidental 4.4 into 3.2 cm irregular thrombosed saccular aneurysm of infrarenal abdominal aorta   Assessment and Plan:  Sepsis secondary to acute sigmoid diverticulitis -Patient met sepsis criteria with tachycardia, lactic acidosis, borderline BP, source secondary to sigmoid diverticulitis -CT abdomen pelvis with acute sigmoid diverticulitis -Patient was placed on IV fluids, for liquid  diet, IV Zosyn -Much improved, pain has decreased, transition to oral antibiotics, continue full liquid diet or soft diet -Recommended outpatient follow-up with repeat CT abdomen after antibiotic course is completed and screening colonoscopy     Alcohol abuse -Patient was placed on CIWA with Ativan, thiamine, folate, MVI - counseled strongly on alcohol cessation     Hyponatremia, lactic acidosis -Likely due to nausea and vomiting, dehydration -Improved, sodium 137 on discharge.     Transaminitis -AST elevated, likely due to alcohol abuse, improving -AST improved to 61, on admission was 125     Macrocytic anemia, thrombocytopenia -Likely due to alcohol abuse -B12 folate normal   AAA, infrarenal without rupture, incidental finding -CT abdomen incidentally showed 4.4x 3.2 cm saccular aneurysm of infrarenal abdominal aorta -Recommended follow-up every 12 months and vascular consultation (ambulatory referral placed on discharge).       Pain control - [employer] Controlled Substance Reporting System database was reviewed. and patient was instructed, not to drive, operate heavy machinery, perform activities at heights, swimming or participation in water activities or provide baby-sitting services while on Pain, Sleep and Anxiety Medications; until their outpatient Physician has advised to do so again. Also recommended to not to take more than prescribed Pain, Sleep and Anxiety Medications.  Consultants: None Procedures performed: None Disposition: Home Diet recommendation:  Discharge Diet Orders (From admission, onward)     Start     Ordered   05/30/22 0000  Diet - low sodium heart healthy        05/30/22 1039            DISCHARGE MEDICATION: Allergies as of 05/30/2022       Reactions   Sulfa Antibiotics Hives  Medication List     TAKE these medications    acetaminophen 500 MG tablet Commonly known as: TYLENOL Take one or two tablet by mouth every 6   hours as needed for pain   amoxicillin-clavulanate 875-125 MG tablet Commonly known as: AUGMENTIN Take 1 tablet by mouth 2 (two) times daily for 14 days.   aspirin 81 MG chewable tablet Chew 81 mg by mouth daily as needed.   atorvastatin 10 MG tablet Commonly known as: LIPITOR Take 1 tablet (10 mg total) by mouth every evening.   methocarbamol 500 MG tablet Commonly known as: ROBAXIN Take 1 tablet (500 mg total) by mouth every 6 (six) hours as needed for muscle spasms.   ondansetron 4 MG tablet Commonly known as: Zofran Take 1 tablet (4 mg total) by mouth every 8 (eight) hours as needed for nausea or vomiting.   senna-docusate 8.6-50 MG tablet Commonly known as: Senokot-S Take 2 tablets by mouth at bedtime. For constipation.  Also available over-the-counter   thiamine 100 MG tablet Commonly known as: Vitamin B-1 Take 1 tablet (100 mg total) by mouth daily. Start taking on: May 31, 2022   traMADol 50 MG tablet Commonly known as: ULTRAM Take 1 tablet (50 mg total) by mouth every 6 (six) hours as needed for up to 20 doses for moderate pain or severe pain. What changed:  how much to take how to take this when to take this reasons to take this        Follow-up Information     Jodi Marble, MD. Schedule an appointment as soon as possible for a visit in 2 week(s).   Specialty: Internal Medicine Why: for hospital follow-up Contact information: [place] [address] [phone]                Discharge Exam: Danley Danker Weights   05/29/22 0943  Weight: 74.4 kg   S: Abdominal pain has significantly improved, no nausea or vomiting.  No fevers  Vitals:   05/29/22 1555 05/29/22 1946 05/30/22 0536 05/30/22 0949  BP: 129/82 (!) 150/94 96/76 (!) 134/94  Pulse: 90 90 92 93  Resp: 20 17 18 18   Temp: (!) 100.4 F (38 C) 98.9 F (37.2 C) 99 F (37.2 C) 98.2 F (36.8 C)  TempSrc: Oral Oral  Oral  SpO2: 94% 98% 97% 97%  Weight:       Height:        Physical Exam General: Alert and oriented x 3, NAD Cardiovascular: S1 S2 clear, RRR.  Respiratory: CTAB, no wheezing, rales or rhonchi Gastrointestinal: Soft, minimal TTP in the LLQ nondistended, NBS Ext: no pedal edema bilaterally Neuro: no new deficits Psych: Normal affect and demeanor, alert and oriented x3    Condition at discharge: fair  The results of significant diagnostics from this hospitalization (including imaging, microbiology, ancillary and laboratory) are listed below for reference.   Imaging Studies: CT ABDOMEN PELVIS W CONTRAST  Result Date: 05/28/2022 CLINICAL DATA:  Left lower quadrant abdominal pain EXAM: CT ABDOMEN AND PELVIS WITH CONTRAST TECHNIQUE: Multidetector CT imaging of the abdomen and pelvis was performed using the standard protocol following bolus administration of intravenous contrast. RADIATION DOSE REDUCTION: This exam was performed according to the departmental dose-optimization program which includes automated exposure control, adjustment of the mA and/or kV according to patient size and/or use of iterative reconstruction technique. CONTRAST:  185m OMNIPAQUE IOHEXOL 300 MG/ML  SOLN COMPARISON:  None Available. FINDINGS: Lower chest: No focal consolidation or pulmonary  nodule in the lung bases. Bilateral lower lobe subpleural scarring. No pleural effusion or pneumothorax demonstrated. Partially imaged heart size is normal. Coronary artery calcifications. Hepatobiliary: Diffuse parenchymal hypoattenuation in keeping with steatosis. No focal lesions. No intra or extrahepatic biliary ductal dilation. Normal gallbladder. Pancreas: No focal lesions. Mild prominence of the main duct in the pancreatic head measuring 4 mm. Spleen: Normal in size without focal abnormality. Adrenals/Urinary Tract: No adrenal nodules. No focal renal lesions, calculi or hydronephrosis. Bilateral perinephric stranding, nonspecific. No focal bladder wall thickening.  Stomach/Bowel: Normal appearance of the stomach. Colonic diverticulosis. Centered within the sigmoid colon, there is an area of mural thickening and pericolonic stranding associated with a thick-walled saccular outpouching. A diverticulum in this area closely abuts the dome of the bladder (6:69). Diffuse mural hypoattenuation of the ascending colon is nonspecific, however can be seen in setting of prior inflammation. Normal appendix. Vascular/Lymphatic: Aortic atherosclerosis. 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 No enlarged abdominal or pelvic lymph nodes. Reproductive: Prostate is unremarkable. Other: Left lower quadrant peritoneal thickening adjacent to the sigmoid colon inflammation. Musculoskeletal: No acute or abnormal lytic or blastic osseous lesions. Multilevel degenerative changes of the partially imaged thoracic and lumbar spine. Partially imaged 16 mm inclusion cyst along the left posterior thoracic region at the level of the eighth rib (2:2). IMPRESSION: 1. Acute sigmoid diverticulitis involving a large diverticulum with adjacent peritoneal thickening, likely reactive peritonitis. The medial aspect this diverticulitis closely abuts the dome of the bladder, which may predispose to fistula formation. Attention on follow-up. 2. 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 Recommend follow-up every 12 months and vascular consultation. This recommendation follows ACR consensus guidelines: White Paper of the ACR Incidental Findings Committee II on Vascular Findings. J Am Coll Radiol 2013; 10:789-794. 3. Hepatic steatosis. 4. Coronary artery calcifications. Aortic Atherosclerosis (ICD10-I70.0). Electronically Signed   By: Darrin Nipper M.D.   On: 05/28/2022 20:31   DG Cervical Spine 2 or 3 views  Result  Date: 05/28/2022 CLINICAL DATA:  Prior laminoplasty.  Cervical myelopathy. EXAM: CERVICAL SPINE - 2-3 VIEW COMPARISON:  MRI cervical spine 05/20/2021 FINDINGS: Mild retrolisthesis C3-4 and C4-5.  Negative for fracture or mass. Moderate disc degeneration and spurring C3-4, C4-5, C5-6, C6-7. Surgical screws and plates in the lamina on the right at C3, C4, C5, C6. Hardware in satisfactory position. IMPRESSION: Cervical spondylosis Surgical screws and plates in the lamina on the right of C3 through C6. Electronically Signed   By: Franchot Gallo M.D.   On: 05/28/2022 18:49   DG Chest Port 1 View  Result Date: 05/28/2022 CLINICAL DATA:  Question sepsis EXAM: PORTABLE CHEST 1 VIEW COMPARISON:  Chest 02/18/2019 FINDINGS: The heart size and mediastinal contours are within normal limits. Both lungs are clear. The visualized skeletal structures are unremarkable. IMPRESSION: No active disease. Electronically Signed   By: Franchot Gallo M.D.   On: 05/28/2022 18:44    Microbiology: Results for orders placed or performed during the hospital encounter of 05/28/22  Blood Culture (routine x 2)     Status: None (Preliminary result)   Collection Time: 05/28/22  6:39 PM   Specimen: BLOOD  Result Value Ref Range Status   Specimen Description BLOOD LEFT ANTECUBITAL  Final   Special Requests   Final    BOTTLES DRAWN AEROBIC AND ANAEROBIC Blood Culture adequate volume  Culture   Final    NO GROWTH 2 DAYS Performed at [facility], [address] [place], [place] [address]    Report Status PENDING  Incomplete  Blood Culture (routine x 2)     Status: None (Preliminary result)   Collection Time: 05/28/22  6:40 PM   Specimen: BLOOD RIGHT HAND  Result Value Ref Range Status   Specimen Description BLOOD RIGHT HAND  Final   Special Requests   Final    BOTTLES DRAWN AEROBIC AND ANAEROBIC Blood Culture adequate volume   Culture   Final    NO GROWTH 2 DAYS Performed at [facility], [address] [place], [place] [address]    Report Status PENDING  Incomplete  Urine Culture     Status: None   Collection Time: 05/28/22 11:48 PM   Specimen: In/Out Cath Urine  Result Value Ref Range Status   Specimen Description   Final    IN/OUT CATH URINE Performed at [facility], [address], [place], [place] [address]    Special Requests   Final    NONE Performed at [facility], [address], [place], [place] [address]    Culture   Final    NO GROWTH Performed at [facility], [place] [address], [place], [place] [address]    Report Status 05/30/2022 FINAL  Final    Labs: CBC: Recent Labs  Lab 05/28/22 1517 05/29/22 0515 05/30/22 0427  WBC 8.3 6.2 5.1  HGB 13.1 11.8* 11.9*  HCT 38.0* 34.1* 32.8*  MCV 103.5* 103.3* 100.3*  PLT 175 134* 638*   Basic Metabolic Panel: Recent Labs  Lab 05/28/22 1840 05/29/22 0636 05/30/22 0427  NA 134* 134* 137  K 3.9 4.1 3.4*  CL 99 101 104  CO2 20* 23 24  GLUCOSE 91 94 104*  BUN 8 7* <5*  CREATININE 0.57* 0.52* 0.59*  CALCIUM 8.8* 8.2* 9.0   Liver Function Tests: Recent Labs  Lab 05/28/22 1840 05/29/22 0636 05/30/22 0427  AST 125* 93* 61*  ALT 39 31 26  ALKPHOS 89 71 68  BILITOT 2.2* 2.6* 1.4*  PROT 8.1 6.7 6.5  ALBUMIN 4.1 3.3* 3.2*   CBG: No results for input(s): "GLUCAP" in the last 168 hours.  Discharge time spent: greater than 30 minutes.  Signed: Estill Cotta, MD [facility] 05/30/2022

## 2022-05-30 NOTE — Progress Notes (Signed)
Discharge instructions given to patient.  Education emphasized on importance of finishing antibiotic therapy.  Patient verbalized understanding.  Needs addressed.  Patient is waiting for his wife and his sister for ride home.

## 2022-05-30 NOTE — Progress Notes (Signed)
AVS reprinted to include update from Dr. Tana Coast - vascular referral.

## 2022-05-30 NOTE — Plan of Care (Signed)
  Problem: Clinical Measurements: Goal: Will remain free from infection Outcome: Progressing   Problem: Clinical Measurements: Goal: Diagnostic test results will improve Outcome: Progressing   Problem: Activity: Goal: Risk for activity intolerance will decrease Outcome: Progressing   Problem: Safety: Goal: Ability to remain free from injury will improve Outcome: Progressing

## 2022-06-02 ENCOUNTER — Ambulatory Visit: Payer: 59

## 2022-06-02 DIAGNOSIS — R262 Difficulty in walking, not elsewhere classified: Secondary | ICD-10-CM

## 2022-06-02 DIAGNOSIS — M542 Cervicalgia: Secondary | ICD-10-CM | POA: Diagnosis not present

## 2022-06-02 DIAGNOSIS — M5412 Radiculopathy, cervical region: Secondary | ICD-10-CM | POA: Diagnosis not present

## 2022-06-02 DIAGNOSIS — M6281 Muscle weakness (generalized): Secondary | ICD-10-CM | POA: Diagnosis not present

## 2022-06-02 DIAGNOSIS — Z9181 History of falling: Secondary | ICD-10-CM

## 2022-06-02 DIAGNOSIS — R2681 Unsteadiness on feet: Secondary | ICD-10-CM | POA: Diagnosis not present

## 2022-06-02 DIAGNOSIS — M5459 Other low back pain: Secondary | ICD-10-CM

## 2022-06-02 LAB — CULTURE, BLOOD (ROUTINE X 2)
Culture: NO GROWTH
Culture: NO GROWTH
Special Requests: ADEQUATE
Special Requests: ADEQUATE
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
## 2022-06-04 ENCOUNTER — Ambulatory Visit: Payer: 59

## 2022-06-04 DIAGNOSIS — M5412 Radiculopathy, cervical region: Secondary | ICD-10-CM

## 2022-06-04 DIAGNOSIS — R262 Difficulty in walking, not elsewhere classified: Secondary | ICD-10-CM | POA: Diagnosis not present

## 2022-06-04 DIAGNOSIS — R2681 Unsteadiness on feet: Secondary | ICD-10-CM | POA: Diagnosis not present

## 2022-06-04 DIAGNOSIS — M6281 Muscle weakness (generalized): Secondary | ICD-10-CM | POA: Diagnosis not present

## 2022-06-04 DIAGNOSIS — M542 Cervicalgia: Secondary | ICD-10-CM | POA: Diagnosis not present

## 2022-06-04 DIAGNOSIS — Z9181 History of falling: Secondary | ICD-10-CM | POA: Diagnosis not present

## 2022-06-04 DIAGNOSIS — M5459 Other low back pain: Secondary | ICD-10-CM | POA: Diagnosis not present
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
## 2022-06-10 ENCOUNTER — Ambulatory Visit: Payer: 59 | Attending: Neurosurgery

## 2022-06-10 DIAGNOSIS — M6281 Muscle weakness (generalized): Secondary | ICD-10-CM

## 2022-06-10 DIAGNOSIS — M5459 Other low back pain: Secondary | ICD-10-CM | POA: Diagnosis not present

## 2022-06-10 DIAGNOSIS — M542 Cervicalgia: Secondary | ICD-10-CM | POA: Diagnosis not present

## 2022-06-10 DIAGNOSIS — R262 Difficulty in walking, not elsewhere classified: Secondary | ICD-10-CM

## 2022-06-10 DIAGNOSIS — M5412 Radiculopathy, cervical region: Secondary | ICD-10-CM | POA: Diagnosis not present

## 2022-06-10 DIAGNOSIS — Z9181 History of falling: Secondary | ICD-10-CM

## 2022-06-10 DIAGNOSIS — R2681 Unsteadiness on feet: Secondary | ICD-10-CM | POA: Diagnosis not present
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
## 2022-06-12 ENCOUNTER — Other Ambulatory Visit: Payer: Self-pay

## 2022-06-12 DIAGNOSIS — I10 Essential (primary) hypertension: Secondary | ICD-10-CM | POA: Diagnosis not present

## 2022-06-12 DIAGNOSIS — D7589 Other specified diseases of blood and blood-forming organs: Secondary | ICD-10-CM | POA: Diagnosis not present

## 2022-06-12 DIAGNOSIS — N400 Enlarged prostate without lower urinary tract symptoms: Secondary | ICD-10-CM | POA: Diagnosis not present

## 2022-06-12 DIAGNOSIS — M1990 Unspecified osteoarthritis, unspecified site: Secondary | ICD-10-CM | POA: Diagnosis not present

## 2022-06-12 DIAGNOSIS — E785 Hyperlipidemia, unspecified: Secondary | ICD-10-CM | POA: Diagnosis not present

## 2022-06-12 DIAGNOSIS — M5412 Radiculopathy, cervical region: Secondary | ICD-10-CM | POA: Diagnosis not present

## 2022-06-12 DIAGNOSIS — M5030 Other cervical disc degeneration, unspecified cervical region: Secondary | ICD-10-CM | POA: Diagnosis not present

## 2022-06-12 DIAGNOSIS — K5732 Diverticulitis of large intestine without perforation or abscess without bleeding: Secondary | ICD-10-CM | POA: Diagnosis not present

## 2022-06-12 DIAGNOSIS — F17200 Nicotine dependence, unspecified, uncomplicated: Secondary | ICD-10-CM | POA: Diagnosis not present

## 2022-06-12 MED ORDER — TRAMADOL-ACETAMINOPHEN 37.5-325 MG PO TABS
ORAL_TABLET | ORAL | 0 refills | Status: DC
  Filled 2022-06-12: qty 28, 7d supply, fill #0

## 2022-06-16 ENCOUNTER — Ambulatory Visit: Payer: 59

## 2022-06-16 DIAGNOSIS — R2681 Unsteadiness on feet: Secondary | ICD-10-CM | POA: Diagnosis not present

## 2022-06-16 DIAGNOSIS — R262 Difficulty in walking, not elsewhere classified: Secondary | ICD-10-CM | POA: Diagnosis not present

## 2022-06-16 DIAGNOSIS — M5459 Other low back pain: Secondary | ICD-10-CM

## 2022-06-16 DIAGNOSIS — M542 Cervicalgia: Secondary | ICD-10-CM

## 2022-06-16 DIAGNOSIS — M6281 Muscle weakness (generalized): Secondary | ICD-10-CM | POA: Diagnosis not present

## 2022-06-16 DIAGNOSIS — Z9181 History of falling: Secondary | ICD-10-CM

## 2022-06-16 DIAGNOSIS — M5412 Radiculopathy, cervical region: Secondary | ICD-10-CM
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
## 2022-06-18 ENCOUNTER — Ambulatory Visit: Payer: 59

## 2022-06-18 DIAGNOSIS — M5412 Radiculopathy, cervical region: Secondary | ICD-10-CM

## 2022-06-18 DIAGNOSIS — R262 Difficulty in walking, not elsewhere classified: Secondary | ICD-10-CM | POA: Diagnosis not present

## 2022-06-18 DIAGNOSIS — Z9181 History of falling: Secondary | ICD-10-CM | POA: Diagnosis not present

## 2022-06-18 DIAGNOSIS — M6281 Muscle weakness (generalized): Secondary | ICD-10-CM

## 2022-06-18 DIAGNOSIS — M542 Cervicalgia: Secondary | ICD-10-CM | POA: Diagnosis not present

## 2022-06-18 DIAGNOSIS — R2681 Unsteadiness on feet: Secondary | ICD-10-CM | POA: Diagnosis not present

## 2022-06-18 DIAGNOSIS — M5459 Other low back pain: Secondary | ICD-10-CM | POA: Diagnosis not present
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
## 2022-06-22 ENCOUNTER — Ambulatory Visit: Payer: 59

## 2022-06-22 DIAGNOSIS — M5459 Other low back pain: Secondary | ICD-10-CM | POA: Diagnosis not present

## 2022-06-22 DIAGNOSIS — M542 Cervicalgia: Secondary | ICD-10-CM

## 2022-06-22 DIAGNOSIS — Z9181 History of falling: Secondary | ICD-10-CM

## 2022-06-22 DIAGNOSIS — R2681 Unsteadiness on feet: Secondary | ICD-10-CM

## 2022-06-22 DIAGNOSIS — R262 Difficulty in walking, not elsewhere classified: Secondary | ICD-10-CM | POA: Diagnosis not present

## 2022-06-22 DIAGNOSIS — M6281 Muscle weakness (generalized): Secondary | ICD-10-CM | POA: Diagnosis not present

## 2022-06-22 DIAGNOSIS — M5412 Radiculopathy, cervical region: Secondary | ICD-10-CM

## 2022-06-22 NOTE — Patient Instructions (Signed)
Lying on your recliner:   Press your tongue at the roof of your mouth   Then nod   10 times    3 sets daily.
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
## 2022-06-24 ENCOUNTER — Ambulatory Visit: Payer: 59

## 2022-06-24 DIAGNOSIS — M542 Cervicalgia: Secondary | ICD-10-CM | POA: Diagnosis not present

## 2022-06-24 DIAGNOSIS — R262 Difficulty in walking, not elsewhere classified: Secondary | ICD-10-CM

## 2022-06-24 DIAGNOSIS — M6281 Muscle weakness (generalized): Secondary | ICD-10-CM | POA: Diagnosis not present

## 2022-06-24 DIAGNOSIS — M5459 Other low back pain: Secondary | ICD-10-CM | POA: Diagnosis not present

## 2022-06-24 DIAGNOSIS — M5412 Radiculopathy, cervical region: Secondary | ICD-10-CM | POA: Diagnosis not present

## 2022-06-24 DIAGNOSIS — R2681 Unsteadiness on feet: Secondary | ICD-10-CM | POA: Diagnosis not present

## 2022-06-24 DIAGNOSIS — Z9181 History of falling: Secondary | ICD-10-CM | POA: Diagnosis not present
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
## 2022-06-29 ENCOUNTER — Ambulatory Visit: Payer: 59

## 2022-06-29 DIAGNOSIS — Z9181 History of falling: Secondary | ICD-10-CM | POA: Diagnosis not present

## 2022-06-29 DIAGNOSIS — R2681 Unsteadiness on feet: Secondary | ICD-10-CM

## 2022-06-29 DIAGNOSIS — M542 Cervicalgia: Secondary | ICD-10-CM | POA: Diagnosis not present

## 2022-06-29 DIAGNOSIS — M6281 Muscle weakness (generalized): Secondary | ICD-10-CM

## 2022-06-29 DIAGNOSIS — R262 Difficulty in walking, not elsewhere classified: Secondary | ICD-10-CM

## 2022-06-29 DIAGNOSIS — M5459 Other low back pain: Secondary | ICD-10-CM | POA: Diagnosis not present

## 2022-06-29 DIAGNOSIS — M5412 Radiculopathy, cervical region: Secondary | ICD-10-CM | POA: Diagnosis not present
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
## 2022-07-01 ENCOUNTER — Ambulatory Visit: Payer: 59

## 2022-07-01 DIAGNOSIS — R2681 Unsteadiness on feet: Secondary | ICD-10-CM

## 2022-07-01 DIAGNOSIS — Z9181 History of falling: Secondary | ICD-10-CM | POA: Diagnosis not present

## 2022-07-01 DIAGNOSIS — M6281 Muscle weakness (generalized): Secondary | ICD-10-CM

## 2022-07-01 DIAGNOSIS — R262 Difficulty in walking, not elsewhere classified: Secondary | ICD-10-CM | POA: Diagnosis not present

## 2022-07-01 DIAGNOSIS — M542 Cervicalgia: Secondary | ICD-10-CM | POA: Diagnosis not present

## 2022-07-01 DIAGNOSIS — M5412 Radiculopathy, cervical region: Secondary | ICD-10-CM | POA: Diagnosis not present

## 2022-07-01 DIAGNOSIS — M5459 Other low back pain: Secondary | ICD-10-CM | POA: Diagnosis not present
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
## 2022-07-07 ENCOUNTER — Other Ambulatory Visit: Payer: Self-pay

## 2022-07-07 ENCOUNTER — Ambulatory Visit: Payer: 59

## 2022-07-07 DIAGNOSIS — M5459 Other low back pain: Secondary | ICD-10-CM | POA: Diagnosis not present

## 2022-07-07 DIAGNOSIS — R262 Difficulty in walking, not elsewhere classified: Secondary | ICD-10-CM

## 2022-07-07 DIAGNOSIS — M6281 Muscle weakness (generalized): Secondary | ICD-10-CM

## 2022-07-07 DIAGNOSIS — M5412 Radiculopathy, cervical region: Secondary | ICD-10-CM | POA: Diagnosis not present

## 2022-07-07 DIAGNOSIS — R2681 Unsteadiness on feet: Secondary | ICD-10-CM

## 2022-07-07 DIAGNOSIS — M542 Cervicalgia: Secondary | ICD-10-CM | POA: Diagnosis not present

## 2022-07-07 DIAGNOSIS — Z9181 History of falling: Secondary | ICD-10-CM | POA: Diagnosis not present
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
## 2022-07-08 ENCOUNTER — Other Ambulatory Visit: Payer: Self-pay

## 2022-07-09 ENCOUNTER — Encounter (INDEPENDENT_AMBULATORY_CARE_PROVIDER_SITE_OTHER): Payer: Self-pay | Admitting: Nurse Practitioner

## 2022-07-09 ENCOUNTER — Ambulatory Visit: Payer: 59

## 2022-07-09 ENCOUNTER — Other Ambulatory Visit (INDEPENDENT_AMBULATORY_CARE_PROVIDER_SITE_OTHER): Payer: Self-pay | Admitting: Nurse Practitioner

## 2022-07-09 ENCOUNTER — Ambulatory Visit (INDEPENDENT_AMBULATORY_CARE_PROVIDER_SITE_OTHER): Payer: 59 | Admitting: Nurse Practitioner

## 2022-07-09 VITALS — BP 117/84 | HR 90 | Resp 16 | Wt 163.0 lb

## 2022-07-09 DIAGNOSIS — I7143 Infrarenal abdominal aortic aneurysm, without rupture: Secondary | ICD-10-CM

## 2022-07-09 DIAGNOSIS — I70213 Atherosclerosis of native arteries of extremities with intermittent claudication, bilateral legs: Secondary | ICD-10-CM | POA: Diagnosis not present

## 2022-07-09 DIAGNOSIS — I70219 Atherosclerosis of native arteries of extremities with intermittent claudication, unspecified extremity: Secondary | ICD-10-CM

## 2022-07-09 DIAGNOSIS — E782 Mixed hyperlipidemia: Secondary | ICD-10-CM | POA: Diagnosis not present

## 2022-07-10 ENCOUNTER — Ambulatory Visit (INDEPENDENT_AMBULATORY_CARE_PROVIDER_SITE_OTHER): Payer: 59

## 2022-07-10 DIAGNOSIS — I70219 Atherosclerosis of native arteries of extremities with intermittent claudication, unspecified extremity: Secondary | ICD-10-CM

## 2022-07-10 DIAGNOSIS — I7143 Infrarenal abdominal aortic aneurysm, without rupture: Secondary | ICD-10-CM

## 2022-07-11 DIAGNOSIS — E785 Hyperlipidemia, unspecified: Secondary | ICD-10-CM | POA: Insufficient documentation

## 2022-07-11 DIAGNOSIS — I70219 Atherosclerosis of native arteries of extremities with intermittent claudication, unspecified extremity: Secondary | ICD-10-CM | POA: Insufficient documentation

## 2022-07-11 NOTE — Progress Notes (Signed)
MRN : [id number]  Ryan Rose is a 61 y.o. ([birth year]) male who presents with chief complaint of check circulation.  History of Present Illness:   The patient presents to the office for evaluation of an abdominal aortic aneurysm. The aneurysm was found incidentally by CT scan done at [facility] on 05/28/2022 for left lower quadrant pain. Patient denies persistent abdominal pain or unusual back pain, no other abdominal complaints.  No history of an abrupt onset of a painful toe associated with blue discoloration.     No family history of AAA.   Patient denies amaurosis fugax or TIA symptoms. There is no history of claudication or rest pain symptoms of the lower extremities.  The patient denies angina or shortness of breath.  CT scan,dated 05/28/2022, is reviewed by me and shows an AAA that measures 4.5 cm but it is a sacular configuration with thrombus moderate ASO at the iliac bifurcation.  ABI's Rt=1.23 and Lt=1.22 Duplex ultrasound of the lower extremity arterial system is negative for popliteal artery aneurysms  No outpatient medications have been marked as taking for the 07/13/22 encounter (Appointment) with Delana Meyer, Dolores Lory, MD.    Past Medical History:  Diagnosis Date   Arthritis    Elevated blood pressure reading    History of methicillin resistant staphylococcus aureus (MRSA)     Past Surgical History:  Procedure Laterality Date   CERVICAL LAMINOPLASTY  08/20/2021   right C3-C6   COLONOSCOPY     SKIN GRAFT     to hand and forearm    Social History Social History   Tobacco Use   Smoking status: Some Days    Years: 45.00    Types: Cigarettes   Smokeless tobacco: Never   Tobacco comments:    Smokes 5 cigarettes weekly  Vaping Use   Vaping Use: Never used  Substance Use Topics   Alcohol use: Yes    Comment: occasional   Drug use: Never    Family History Family History  Problem Relation Age of Onset   Heart attack Mother      Allergies  Allergen Reactions   Sulfa Antibiotics Hives     REVIEW OF SYSTEMS (Negative unless checked)  Constitutional: [] Weight loss  [] Fever  [] Chills Cardiac: [] Chest pain   [] Chest pressure   [] Palpitations   [] Shortness of breath when laying flat   [] Shortness of breath with exertion. Vascular:  [x] Pain in legs with walking   [] Pain in legs at rest  [] History of DVT   [] Phlebitis   [] Swelling in legs   [] Varicose veins   [] Non-healing ulcers Pulmonary:   [] Uses home oxygen   [] Productive cough   [] Hemoptysis   [] Wheeze  [] COPD   [] Asthma Neurologic:  [] Dizziness   [] Seizures   [] History of stroke   [] History of TIA  [] Aphasia   [] Vissual changes   [] Weakness or numbness in arm   [] Weakness or numbness in leg Musculoskeletal:   [] Joint swelling   [] Joint pain   [] Low back pain Hematologic:  [] Easy bruising  [] Easy bleeding   [] Hypercoagulable state   [] Anemic Gastrointestinal:  [] Diarrhea   [] Vomiting  [] Gastroesophageal reflux/heartburn   [] Difficulty swallowing. Genitourinary:  [] Chronic kidney disease   [] Difficult urination  [] Frequent urination   [] Blood in urine Skin:  [] Rashes   [] Ulcers  Psychological:  [] History of anxiety   []  History of major depression.  Physical Examination  There were no vitals  filed for this visit. There is no height or weight on file to calculate BMI. Gen: WD/WN, NAD Head: [place]/AT, No temporalis wasting.  Ear/Nose/Throat: Hearing grossly intact, nares w/o erythema or drainage Eyes: PER, EOMI, sclera nonicteric.  Neck: Supple, no masses.  No bruit or JVD.  Pulmonary:  Good air movement, no audible wheezing, no use of accessory muscles.  Cardiac: RRR, normal S1, S2, no Murmurs. Vascular:   Vessel Right Left  Radial Palpable Palpable  PT Palpable Palpable  DP Palpable Palpable  Gastrointestinal: soft, non-distended. No guarding/no peritoneal signs.  Musculoskeletal: M/S 5/5 throughout.  No visible deformity.  Neurologic: CN 2-12 intact.  Pain and light touch intact in extremities.  Symmetrical.  Speech is fluent. Motor exam as listed above. Psychiatric: Judgment intact, Mood & affect appropriate for pt's clinical situation. Dermatologic: No rashes or ulcers noted.  No changes consistent with cellulitis.   CBC Lab Results  Component Value Date   WBC 5.1 05/30/2022   HGB 11.9 (L) 05/30/2022   HCT 32.8 (L) 05/30/2022   MCV 100.3 (H) 05/30/2022   PLT 147 (L) 05/30/2022    BMET    Component Value Date/Time   NA 137 05/30/2022 0427   NA 140 11/25/2014 1016   K 3.4 (L) 05/30/2022 0427   K 3.6 11/25/2014 1016   CL 104 05/30/2022 0427   CL 107 11/25/2014 1016   CO2 24 05/30/2022 0427   CO2 16 (L) 11/25/2014 1016   GLUCOSE 104 (H) 05/30/2022 0427   GLUCOSE 91 11/25/2014 1016   BUN <5 (L) 05/30/2022 0427   BUN 13 11/25/2014 1016   CREATININE 0.59 (L) 05/30/2022 0427   CREATININE 1.05 11/25/2014 1016   CALCIUM 9.0 05/30/2022 0427   CALCIUM 8.4 (L) 11/25/2014 1016   GFRNONAA >60 05/30/2022 0427   GFRNONAA >60 11/25/2014 1016   GFRAA >60 02/18/2019 0259   GFRAA >60 11/25/2014 1016   CrCl cannot be calculated (Patient's most recent lab result is older than the maximum 21 days allowed.).  COAG Lab Results  Component Value Date   INR 1.0 05/28/2022   INR 1.0 08/06/2021    Radiology No results found.   Assessment/Plan 1. Atherosclerosis of native artery of both lower extremities with intermittent claudication (HCC)  Recommend:  The patient has evidence of atherosclerosis of the lower extremities with claudication.  The patient does not voice lifestyle limiting changes at this point in time.  Noninvasive studies do not suggest clinically significant change.  No invasive studies, angiography or surgery at this time The patient should continue walking and begin a more formal exercise program.  The patient should continue antiplatelet therapy and aggressive treatment of the lipid abnormalities  No changes in  the patient's medications at this time  Continued surveillance is indicated as atherosclerosis is likely to progress with time.    The patient will continue follow up with noninvasive studies as ordered.   2. Infrarenal abdominal aortic aneurysm (AAA) without rupture ([place]) Recommend:  The aneurysm is > 5 cm and therefore should undergo repair. Patient is status post CT scan of the abdominal aorta. The patient is a candidate for endovascular repair.   The patient will require cardiac clearance prior to stent graft placement.   The patient will continue antiplatelet therapy as prescribed (since the patient is undergoing endovascular repair as opposed to open repair) as well as aggressive management of hyperlipidemia. Exercise is again strongly encouraged.   The patient is reminded that lifetime routine surveillance is a necessity  with an endograft.   The risks and benefits of AAA repair are reviewed with the patient.  All questions are answered.  Alternative therapies are also discussed.  The patient agrees to proceed with endovascular aneurysm repair.  Patient will follow-up with me in the office after the surgery.  3. Mixed hyperlipidemia Continue statin as ordered and reviewed, no changes at this time    Levora Dredge, MD  07/11/2022 3:15 PM

## 2022-07-12 ENCOUNTER — Other Ambulatory Visit: Payer: Self-pay

## 2022-07-13 ENCOUNTER — Ambulatory Visit (INDEPENDENT_AMBULATORY_CARE_PROVIDER_SITE_OTHER): Payer: 59 | Admitting: Vascular Surgery

## 2022-07-13 ENCOUNTER — Other Ambulatory Visit: Payer: Self-pay

## 2022-07-13 VITALS — BP 143/90 | HR 102 | Wt 164.6 lb

## 2022-07-13 DIAGNOSIS — E782 Mixed hyperlipidemia: Secondary | ICD-10-CM

## 2022-07-13 DIAGNOSIS — I7143 Infrarenal abdominal aortic aneurysm, without rupture: Secondary | ICD-10-CM | POA: Diagnosis not present

## 2022-07-13 DIAGNOSIS — I70213 Atherosclerosis of native arteries of extremities with intermittent claudication, bilateral legs: Secondary | ICD-10-CM

## 2022-07-14 ENCOUNTER — Encounter (INDEPENDENT_AMBULATORY_CARE_PROVIDER_SITE_OTHER): Payer: Self-pay | Admitting: Nurse Practitioner

## 2022-07-14 ENCOUNTER — Ambulatory Visit: Payer: 59

## 2022-07-14 NOTE — Progress Notes (Signed)
Subjective:    Patient ID: Ryan Rose, male    DOB: [birth year], 61 y.o.   MRN: [id number] Chief Complaint  Patient presents with   New Patient (Initial Visit)    Ref Rai consult incidental finding of infrarenal AAA    The patient presents to the office for evaluation of an abdominal aortic aneurysm. The aneurysm was found incidentally by CT scan done at [facility] on 05/28/2022 for left lower quadrant pain. Patient denies persistent abdominal pain or unusual back pain, no other abdominal complaints.  No history of an abrupt onset of a painful toe associated with blue discoloration.      No family history of AAA.    Patient denies amaurosis fugax or TIA symptoms. There is no history of claudication or rest pain symptoms of the lower extremities.  The patient denies angina or shortness of breath.   CT scan,dated 05/28/2022, is reviewed by me and shows an AAA that measures 4.5 cm but it is a sacular configuration with thrombus moderate with moderate atherosclerosis at the iliac bifurcation     Review of Systems  Psychiatric/Behavioral:  The patient is nervous/anxious.   All other systems reviewed and are negative.      Objective:   Physical Exam Vitals reviewed.  HENT:     Head: Normocephalic.  Cardiovascular:     Rate and Rhythm: Normal rate.     Pulses:          Posterior tibial pulses are 1+ on the right side and 1+ on the left side.  Pulmonary:     Effort: Pulmonary effort is normal.  Skin:    General: Skin is warm and dry.  Neurological:     Mental Status: He is alert and oriented to person, place, and time.     Gait: Gait abnormal.  Psychiatric:        Mood and Affect: Mood normal.        Behavior: Behavior normal.        Thought Content: Thought content normal.        Judgment: Judgment normal.    BP 117/84 (BP Location: Left Arm)   Pulse 90   Resp 16   Wt 163 lb (73.9 kg)   BMI 24.42 kg/m   Past Medical History:  Diagnosis Date   Arthritis     Elevated blood pressure reading    History of methicillin resistant staphylococcus aureus (MRSA)     Social History   Socioeconomic History   Marital status: Married    Spouse name: Not on file   Number of children: Not on file   Years of education: Not on file   Highest education level: Not on file  Occupational History   Not on file  Tobacco Use   Smoking status: Some Days    Years: 45.00    Types: Cigarettes   Smokeless tobacco: Never   Tobacco comments:    Smokes 5 cigarettes weekly  Vaping Use   Vaping Use: Never used  Substance and Sexual Activity   Alcohol use: Yes    Comment: occasional   Drug use: Never   Sexual activity: Not on file  Other Topics Concern   Not on file  Social History Narrative   Not on file   Social Determinants of Health   Financial Resource Strain: Not on file  Food Insecurity: No Food Insecurity (05/29/2022)   Hunger Vital Sign    Worried About Running Out of Food in the Last Year:  Never true    Ran Out of Food in the Last Year: Never true  Transportation Needs: No Transportation Needs (05/29/2022)   PRAPARE - Administrator, Civil Service (Medical): No    Lack of Transportation (Non-Medical): No  Physical Activity: Not on file  Stress: Not on file  Social Connections: Not on file  Intimate Partner Violence: Not At Risk (05/29/2022)   Humiliation, Afraid, Rape, and Kick questionnaire    Fear of Current or Ex-Partner: No    Emotionally Abused: No    Physically Abused: No    Sexually Abused: No    Past Surgical History:  Procedure Laterality Date   CERVICAL LAMINOPLASTY  08/20/2021   right C3-C6   COLONOSCOPY     SKIN GRAFT     to hand and forearm    Family History  Problem Relation Age of Onset   Heart attack Mother     Allergies  Allergen Reactions   Sulfa Antibiotics Hives       Latest Ref Rng & Units 05/30/2022    4:27 AM 05/29/2022    5:15 AM 05/28/2022    3:17 PM  CBC  WBC 4.0 - 10.5 K/uL 5.1   6.2  8.3   Hemoglobin 13.0 - 17.0 g/dL 54.6  27.0  35.0   Hematocrit 39.0 - 52.0 % 32.8  34.1  38.0   Platelets 150 - 400 K/uL 147  134  175       CMP     Component Value Date/Time   NA 137 05/30/2022 0427   NA 140 11/25/2014 1016   K 3.4 (L) 05/30/2022 0427   K 3.6 11/25/2014 1016   CL 104 05/30/2022 0427   CL 107 11/25/2014 1016   CO2 24 05/30/2022 0427   CO2 16 (L) 11/25/2014 1016   GLUCOSE 104 (H) 05/30/2022 0427   GLUCOSE 91 11/25/2014 1016   BUN <5 (L) 05/30/2022 0427   BUN 13 11/25/2014 1016   CREATININE 0.59 (L) 05/30/2022 0427   CREATININE 1.05 11/25/2014 1016   CALCIUM 9.0 05/30/2022 0427   CALCIUM 8.4 (L) 11/25/2014 1016   PROT 6.5 05/30/2022 0427   PROT 7.2 11/25/2014 1016   ALBUMIN 3.2 (L) 05/30/2022 0427   ALBUMIN 4.1 11/25/2014 1016   AST 61 (H) 05/30/2022 0427   AST 171 (H) 11/25/2014 1016   ALT 26 05/30/2022 0427   ALT 103 (H) 11/25/2014 1016   ALKPHOS 68 05/30/2022 0427   ALKPHOS 75 11/25/2014 1016   BILITOT 1.4 (H) 05/30/2022 0427   BILITOT 1.2 11/25/2014 1016   GFRNONAA >60 05/30/2022 0427   GFRNONAA >60 11/25/2014 1016   GFRAA >60 02/18/2019 0259   GFRAA >60 11/25/2014 1016     No results found.     Assessment & Plan:   1. Infrarenal abdominal aortic aneurysm (AAA) without rupture (HCC) The patient has a 4.5 cm infrarenal aneurysm that is saccular in nature with a moderate thrombus burden.  He also does have noted significant atherosclerosis in the iliac vessels.  Given the saccular nature in addition to the thrombus this would be an indication for an endovascular aortic aneurysm repair.  However first the patient will likely require cardiac clearance.  We also need to perform further noninvasive studies prior to intervention.  We will have the patient return for noninvasive studies and we will discuss further treatment options once he has those.  We will also send referral to cardiology to begin cardiac clearance. - Ambulatory referral to  Cardiology  2. Atherosclerosis of native artery of both lower extremities with intermittent claudication (HCC) On CT there is evidence of significant plaque within his iliac vessels.  We will have the patient return with noninvasive studies of his lower extremities to ensure there is no significant distal disease.  If there is significant disc disease this may need to be repaired prior to any endovascular stent graft repair.  We also will evaluate for possible bilateral popliteal artery aneurysms as this can also be associated with abdominal aortic aneurysms.  3. Mixed hyperlipidemia Continue statin as ordered and reviewed, no changes at this time   Current Outpatient Medications on File Prior to Visit  Medication Sig Dispense Refill   acetaminophen (TYLENOL) 500 MG tablet Take one or two tablet by mouth every 6  hours as needed for pain 90 tablet 0   aspirin 81 MG chewable tablet Chew 81 mg by mouth daily as needed.     atorvastatin (LIPITOR) 10 MG tablet Take 1 tablet (10 mg total) by mouth every evening. 30 tablet 1   methocarbamol (ROBAXIN) 500 MG tablet Take 1 tablet (500 mg total) by mouth every 6 (six) hours as needed for muscle spasms. 120 tablet 0   ondansetron (ZOFRAN) 4 MG tablet Take 1 tablet (4 mg total) by mouth every 8 (eight) hours as needed for nausea or vomiting. 30 tablet 0   senna-docusate (SENOKOT-S) 8.6-50 MG tablet Take 2 tablets by mouth at bedtime. For constipation.  Also available over-the-counter 60 tablet 3   thiamine (VITAMIN B-1) 100 MG tablet Take 1 tablet (100 mg total) by mouth daily. 30 tablet 0   traMADol (ULTRAM) 50 MG tablet Take 1 tablet (50 mg total) by mouth every 6 (six) hours as needed for up to 20 doses for moderate pain or severe pain. 20 tablet 0   traMADol-acetaminophen (ULTRACET) 37.5-325 MG tablet 1 every 6 hours prn 28 tablet 0   No current facility-administered medications on file prior to visit.    There are no Patient Instructions on file  for this visit. No follow-ups on file.   Georgiana Spinner, NP

## 2022-07-16 ENCOUNTER — Ambulatory Visit: Payer: 59 | Attending: Neurosurgery

## 2022-07-16 DIAGNOSIS — R262 Difficulty in walking, not elsewhere classified: Secondary | ICD-10-CM | POA: Insufficient documentation

## 2022-07-16 DIAGNOSIS — Z9181 History of falling: Secondary | ICD-10-CM | POA: Insufficient documentation

## 2022-07-16 DIAGNOSIS — M542 Cervicalgia: Secondary | ICD-10-CM | POA: Insufficient documentation

## 2022-07-16 DIAGNOSIS — M5412 Radiculopathy, cervical region: Secondary | ICD-10-CM | POA: Insufficient documentation

## 2022-07-16 DIAGNOSIS — R2681 Unsteadiness on feet: Secondary | ICD-10-CM | POA: Insufficient documentation

## 2022-07-16 DIAGNOSIS — M6281 Muscle weakness (generalized): Secondary | ICD-10-CM | POA: Insufficient documentation

## 2022-07-16 DIAGNOSIS — M5459 Other low back pain: Secondary | ICD-10-CM | POA: Diagnosis not present
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
## 2022-07-19 ENCOUNTER — Encounter (INDEPENDENT_AMBULATORY_CARE_PROVIDER_SITE_OTHER): Payer: Self-pay | Admitting: Vascular Surgery

## 2022-07-21 ENCOUNTER — Ambulatory Visit: Payer: 59

## 2022-07-23 ENCOUNTER — Ambulatory Visit: Payer: 59

## 2022-07-23 DIAGNOSIS — R2681 Unsteadiness on feet: Secondary | ICD-10-CM

## 2022-07-23 DIAGNOSIS — M6281 Muscle weakness (generalized): Secondary | ICD-10-CM

## 2022-07-23 DIAGNOSIS — M5412 Radiculopathy, cervical region: Secondary | ICD-10-CM

## 2022-07-23 DIAGNOSIS — R262 Difficulty in walking, not elsewhere classified: Secondary | ICD-10-CM

## 2022-07-23 DIAGNOSIS — M5459 Other low back pain: Secondary | ICD-10-CM

## 2022-07-23 DIAGNOSIS — Z9181 History of falling: Secondary | ICD-10-CM

## 2022-07-23 DIAGNOSIS — M542 Cervicalgia: Secondary | ICD-10-CM
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
## 2022-07-30 ENCOUNTER — Ambulatory Visit: Payer: 59

## 2022-08-11 ENCOUNTER — Ambulatory Visit: Payer: Commercial Managed Care - PPO | Attending: Neurosurgery

## 2022-08-11 DIAGNOSIS — R2681 Unsteadiness on feet: Secondary | ICD-10-CM | POA: Diagnosis not present

## 2022-08-11 DIAGNOSIS — R262 Difficulty in walking, not elsewhere classified: Secondary | ICD-10-CM | POA: Insufficient documentation

## 2022-08-11 DIAGNOSIS — Z9181 History of falling: Secondary | ICD-10-CM | POA: Insufficient documentation

## 2022-08-11 DIAGNOSIS — M6281 Muscle weakness (generalized): Secondary | ICD-10-CM | POA: Insufficient documentation

## 2022-08-11 DIAGNOSIS — M542 Cervicalgia: Secondary | ICD-10-CM | POA: Diagnosis not present

## 2022-08-11 DIAGNOSIS — M5412 Radiculopathy, cervical region: Secondary | ICD-10-CM | POA: Insufficient documentation

## 2022-08-11 DIAGNOSIS — M5459 Other low back pain: Secondary | ICD-10-CM | POA: Insufficient documentation
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
## 2022-08-13 ENCOUNTER — Ambulatory Visit: Payer: Commercial Managed Care - PPO | Attending: Cardiology | Admitting: Cardiology

## 2022-08-13 ENCOUNTER — Encounter: Payer: Self-pay | Admitting: Cardiology

## 2022-08-13 ENCOUNTER — Other Ambulatory Visit: Payer: Self-pay

## 2022-08-13 VITALS — BP 138/90 | HR 92 | Ht 68.5 in | Wt 160.4 lb

## 2022-08-13 DIAGNOSIS — I7143 Infrarenal abdominal aortic aneurysm, without rupture: Secondary | ICD-10-CM | POA: Diagnosis not present

## 2022-08-13 DIAGNOSIS — IMO0001 Reserved for inherently not codable concepts without codable children: Secondary | ICD-10-CM

## 2022-08-13 DIAGNOSIS — I10 Essential (primary) hypertension: Secondary | ICD-10-CM | POA: Diagnosis not present

## 2022-08-13 DIAGNOSIS — F17200 Nicotine dependence, unspecified, uncomplicated: Secondary | ICD-10-CM

## 2022-08-13 DIAGNOSIS — E785 Hyperlipidemia, unspecified: Secondary | ICD-10-CM | POA: Diagnosis not present

## 2022-08-13 DIAGNOSIS — Z01810 Encounter for preprocedural cardiovascular examination: Secondary | ICD-10-CM

## 2022-08-13 MED ORDER — ATORVASTATIN CALCIUM 40 MG PO TABS
40.0000 mg | ORAL_TABLET | Freq: Every day | ORAL | 3 refills | Status: DC
  Filled 2022-08-13: qty 90, 90d supply, fill #0

## 2022-08-13 MED ORDER — LOSARTAN POTASSIUM 25 MG PO TABS
25.0000 mg | ORAL_TABLET | Freq: Every day | ORAL | 3 refills | Status: DC
  Filled 2022-08-13: qty 90, 90d supply, fill #0

## 2022-08-13 NOTE — Progress Notes (Signed)
Cardiology Office Note:    Date:  08/13/2022   ID:  Ryan Rose, DOB 23-May-1961, MRN [id number]  PCP:  Ryan Monday, MD   [facility] Providers Cardiologist:  Ryan Odea, MD     Referring MD: Ryan Monday, MD   Chief Complaint  Patient presents with   New Patient (Initial Visit)    Infrarenal AAA w/o rupture, Family Hx     History of Present Illness:    Ryan Rose is a 62 y.o. male with a hx of abdominal aortic aneurysm, current smoker x 50+ years who presents for preop evaluation.  Patient had an abdominal CT on 05/2022 at which point incidental abdominal aortic aneurysm was noted.  He was evaluated by vascular surgery, endovascular repair being planned.  He denies chest pain or shortness of breath.  Denies any history of heart disease.   Past Medical History:  Diagnosis Date   Arthritis    Elevated blood pressure reading    History of methicillin resistant staphylococcus aureus (MRSA)     Past Surgical History:  Procedure Laterality Date   CERVICAL LAMINOPLASTY  08/20/2021   right C3-C6   COLONOSCOPY     SKIN GRAFT     to hand and forearm    Current Medications: Current Meds  Medication Sig   acetaminophen (TYLENOL) 500 MG tablet Take one or two tablet by mouth every 6  hours as needed for pain   aspirin 81 MG chewable tablet Chew 81 mg by mouth daily as needed.   atorvastatin (LIPITOR) 40 MG tablet Take 1 tablet (40 mg total) by mouth daily.   losartan (COZAAR) 25 MG tablet Take 1 tablet (25 mg total) by mouth daily.   senna-docusate (SENOKOT-S) 8.6-50 MG tablet Take 2 tablets by mouth at bedtime. For constipation.  Also available over-the-counter     Allergies:   Sulfa antibiotics   Social History   Socioeconomic History   Marital status: Married    Spouse name: Not on file   Number of children: Not on file   Years of education: Not on file   Highest education level: Not on file  Occupational History   Not on  file  Tobacco Use   Smoking status: Some Days    Packs/day: 0.10    Years: 45.00    Total pack years: 4.50    Types: Cigarettes   Smokeless tobacco: Never   Tobacco comments:    Smokes 5 cigarettes weekly  Vaping Use   Vaping Use: Never used  Substance and Sexual Activity   Alcohol use: Yes    Comment: occasional   Drug use: Not on file    Comment: occasionally   Sexual activity: Not on file  Other Topics Concern   Not on file  Social History Narrative   Not on file   Social Determinants of Health   Financial Resource Strain: Not on file  Food Insecurity: No Food Insecurity (05/29/2022)   Hunger Vital Sign    Worried About Running Out of Food in the Last Year: Never true    Ran Out of Food in the Last Year: Never true  Transportation Needs: No Transportation Needs (05/29/2022)   PRAPARE - Administrator, Civil Service (Medical): No    Lack of Transportation (Non-Medical): No  Physical Activity: Not on file  Stress: Not on file  Social Connections: Not on file     Family History: The patient's family history includes Heart attack in  his mother.  ROS:   Please see the history of present illness.     All other systems reviewed and are negative.  EKGs/Labs/Other Studies Reviewed:    The following studies were reviewed today:   EKG:  EKG is  ordered today.  The ekg ordered today demonstrates normal sinus rhythm, normal ECG  Recent Labs: 05/30/2022: ALT 26; BUN <5; Creatinine, Ser 0.59; Hemoglobin 11.9; Platelets 147; Potassium 3.4; Sodium 137  Recent Lipid Panel No results found for: "CHOL", "TRIG", "HDL", "CHOLHDL", "VLDL", "LDLCALC", "LDLDIRECT"   Risk Assessment/Calculations:     HYPERTENSION CONTROL Vitals:   08/13/22 1101 08/13/22 1107  BP: (!) 138/90 (!) 138/90    The patient's blood pressure is elevated above target today.  In order to address the patient's elevated BP: A new medication was prescribed today.            Physical  Exam:    VS:  BP (!) 138/90 (BP Location: Right Arm, Patient Position: Sitting)   Pulse 92   Ht 5' 8.5" (1.74 m)   Wt 160 lb 6.4 oz (72.8 kg)   SpO2 97%   BMI 24.03 kg/m     Wt Readings from Last 3 Encounters:  08/13/22 160 lb 6.4 oz (72.8 kg)  07/13/22 164 lb 9.6 oz (74.7 kg)  07/09/22 163 lb (73.9 kg)     GEN:  Well nourished, well developed in no acute distress HEENT: Normal NECK: No JVD; No carotid bruits CARDIAC: RRR, no murmurs, rubs, gallops RESPIRATORY:  Clear to auscultation without rales, wheezing or rhonchi  ABDOMEN: Soft, non-tender, non-distended MUSCULOSKELETAL:  No edema; No deformity  SKIN: Warm and dry NEUROLOGIC:  Alert and oriented x 3 PSYCHIATRIC:  Normal affect   ASSESSMENT:    1. Pre-operative cardiovascular examination   2. Infrarenal abdominal aortic aneurysm (AAA) without rupture ([place])   3. Primary hypertension   4. Hyperlipidemia LDL goal <70   5. Smoking    PLAN:    In order of problems listed above:  Preop evaluation, abdominal vascular procedures typically deemed high risk.  Obtain echocardiogram, obtain Lexiscan Myoview to evaluate high risk ischemia.   Abdominal aortic aneurysm, start aspirin, Lipitor 40 mg daily.  Management as per vascular surgery. Hypertension, start losartan 25 mg daily. Goal LDL less than 70, Lipitor 40 mg as above.  Check lipid panel. Current smoker, smoking cessation advised.  Follow-up after echo and Myoview.     Shared Decision Making/Informed Consent The risks [chest pain, shortness of breath, cardiac arrhythmias, dizziness, blood pressure fluctuations, myocardial infarction, stroke/transient ischemic attack, nausea, vomiting, allergic reaction, radiation exposure, metallic taste sensation and life-threatening complications (estimated to be 1 in 10,000)], benefits (risk stratification, diagnosing coronary artery disease, treatment guidance) and alternatives of a nuclear stress test were discussed in detail with  Ryan Rose and he agrees to proceed.    Medication Adjustments/Labs and Tests Ordered: Current medicines are reviewed at length with the patient today.  Concerns regarding medicines are outlined above.  Orders Placed This Encounter  Procedures   NM Myocar Multi W/Spect W/Wall Motion / EF   Lipid Profile   EKG 12-Lead   ECHOCARDIOGRAM COMPLETE   Meds ordered this encounter  Medications   atorvastatin (LIPITOR) 40 MG tablet    Sig: Take 1 tablet (40 mg total) by mouth daily.    Dispense:  90 tablet    Refill:  3   losartan (COZAAR) 25 MG tablet    Sig: Take 1 tablet (25 mg total)  by mouth daily.    Dispense:  90 tablet    Refill:  3    Patient Instructions  Medication Instructions:   START Losartan - take one tablet (25mg ) by mouth daily.  INCREASE Atorvastatin - Take one tablet (40mg ) by mouth daily.  RESTART Aspirin - take one tablet (81mg ) by mouth daily.   *If you need a refill on your cardiac medications before your next appointment, please call your pharmacy*   Lab Work:  Your physician recommends that you return for lab work at the medical mall prior to your Myoview. You will need to be fasting.  No appt is needed. Hours are M-F 7AM- 6 PM.  If you have labs (blood work) drawn today and your tests are completely normal, you will receive your results only by: MyChart Message (if you have MyChart) OR A paper copy in the mail If you have any lab test that is abnormal or we need to change your treatment, we will call you to review the results.   Testing/Procedures:  Echocardiogram  Your physician has requested that you have an echocardiogram. Echocardiography is a painless test that uses sound waves to create images of your heart. It provides your doctor with information about the size and shape of your heart and how well your heart's chambers and valves are working. This procedure takes approximately one hour. There are no restrictions for this procedure. Please  note; depending on visual quality an IV may need to be placed.  2. [facility] MYOVIEW  Your caregiver has ordered a Stress Test with nuclear imaging. The purpose of this test is to evaluate the blood supply to your heart muscle. This procedure is referred to as a "Non-Invasive Stress Test." This is because other than having an IV started in your vein, nothing is inserted or "invades" your body. Cardiac stress tests are done to find areas of poor blood flow to the heart by determining the extent of coronary artery disease (CAD). Some patients exercise on a treadmill, which naturally increases the blood flow to your heart, while others who are  unable to walk on a treadmill due to physical limitations have a pharmacologic/chemical stress agent called Lexiscan . This medicine will mimic walking on a treadmill by temporarily increasing your coronary blood flow.   Please note: these test may take anywhere between 2-4 hours to complete  PLEASE REPORT TO [facility] MEDICAL MALL ENTRANCE  THE VOLUNTEERS AT THE FIRST DESK WILL DIRECT YOU WHERE TO GO  Date of Procedure:_____________________________________  Arrival Time for Procedure:______________________________   PLEASE NOTIFY THE OFFICE AT LEAST 24 HOURS IN ADVANCE IF YOU ARE UNABLE TO KEEP YOUR APPOINTMENT.  [phone] AND  PLEASE NOTIFY NUCLEAR MEDICINE AT [facility] AT LEAST 24 HOURS IN ADVANCE IF YOU ARE UNABLE TO KEEP YOUR APPOINTMENT. [phone]  How to prepare for your Myoview test:  Do not eat or drink after midnight No caffeine for 24 hours prior to test No smoking 24 hours prior to test. Your medication may be taken with water.  If your doctor stopped a medication because of this test, do not take that medication. Ladies, please do not wear dresses.  Skirts or pants are appropriate. Please wear a short sleeve shirt. No perfume, cologne or lotion.     Follow-Up: At [facility], you and your health needs are our priority.  As part of  our continuing mission to provide you with exceptional heart care, we have created designated Provider Care Teams.  These Care  Teams include your primary Cardiologist (physician) and Advanced Practice Providers (APPs -  Physician Assistants and Nurse Practitioners) who all work together to provide you with the care you need, when you need it.  We recommend signing up for the patient portal called "MyChart".  Sign up information is provided on this After Visit Summary.  MyChart is used to connect with patients for Virtual Visits (Telemedicine).  Patients are able to view lab/test results, encounter notes, upcoming appointments, etc.  Non-urgent messages can be sent to your provider as well.   To learn more about what you can do with MyChart, go to [email or web address].    Your next appointment:  After testing  The format for your next appointment:   In Person  Provider:   You may see Kate Sable, MD or one of the following Advanced Practice Providers on your designated Care Team:   Murray Hodgkins, NP Christell Faith, PA-C Cadence Kathlen Mody, PA-C Gerrie Nordmann, NP      Signed, Kate Sable, MD  08/13/2022 12:13 PM    [place]

## 2022-08-13 NOTE — Patient Instructions (Addendum)
Medication Instructions:   START Losartan - take one tablet (25mg ) by mouth daily.  INCREASE Atorvastatin - Take one tablet (40mg ) by mouth daily.  RESTART Aspirin - take one tablet (81mg ) by mouth daily.   *If you need a refill on your cardiac medications before your next appointment, please call your pharmacy*   Lab Work:  Your physician recommends that you return for lab work at the medical mall prior to your Myoview. You will need to be fasting.  No appt is needed. Hours are M-F 7AM- 6 PM.  If you have labs (blood work) drawn today and your tests are completely normal, you will receive your results only by: [place] (if you have MyChart) OR A paper copy in the mail If you have any lab test that is abnormal or we need to change your treatment, we will call you to review the results.   Testing/Procedures:  Echocardiogram  Your physician has requested that you have an echocardiogram. Echocardiography is a painless test that uses sound waves to create images of your heart. It provides your doctor with information about the size and shape of your heart and how well your heart's chambers and valves are working. This procedure takes approximately one hour. There are no restrictions for this procedure. Please note; depending on visual quality an IV may need to be placed.  2. [place]  Your caregiver has ordered a Stress Test with nuclear imaging. The purpose of this test is to evaluate the blood supply to your heart muscle. This procedure is referred to as a "Non-Invasive Stress Test." This is because other than having an IV started in your vein, nothing is inserted or "invades" your body. Cardiac stress tests are done to find areas of poor blood flow to the heart by determining the extent of coronary artery disease (CAD). Some patients exercise on a treadmill, which naturally increases the blood flow to your heart, while others who are  unable to walk on a treadmill due to  physical limitations have a pharmacologic/chemical stress agent called Lexiscan . This medicine will mimic walking on a treadmill by temporarily increasing your coronary blood flow.   Please note: these test may take anywhere between 2-4 hours to complete  PLEASE REPORT TO [place] AT THE FIRST DESK WILL DIRECT YOU WHERE TO GO  Date of Procedure:_____________________________________  Arrival Time for Procedure:______________________________   PLEASE NOTIFY THE OFFICE AT LEAST 24 HOURS IN ADVANCE IF YOU ARE UNABLE TO KEEP YOUR APPOINTMENT.  [phone] AND  PLEASE NOTIFY NUCLEAR MEDICINE AT [facility] AT LEAST 24 HOURS IN ADVANCE IF YOU ARE UNABLE TO KEEP YOUR APPOINTMENT. [phone]  How to prepare for your Myoview test:  Do not eat or drink after midnight No caffeine for 24 hours prior to test No smoking 24 hours prior to test. Your medication may be taken with water.  If your doctor stopped a medication because of this test, do not take that medication. Ladies, please do not wear dresses.  Skirts or pants are appropriate. Please wear a short sleeve shirt. No perfume, cologne or lotion.     Follow-Up: At [facility], you and your health needs are our priority.  As part of our continuing mission to provide you with exceptional heart care, we have created designated Provider Care Teams.  These Care Teams include your primary Cardiologist (physician) and Advanced Practice Providers (APPs -  Physician Assistants and Nurse Practitioners) who all work together to  provide you with the care you need, when you need it.  We recommend signing up for the patient portal called "MyChart".  Sign up information is provided on this After Visit Summary.  MyChart is used to connect with patients for Virtual Visits (Telemedicine).  Patients are able to view lab/test results, encounter notes, upcoming appointments, etc.  Non-urgent messages can be sent to your  provider as well.   To learn more about what you can do with MyChart, go to [email or web address].    Your next appointment:  After testing  The format for your next appointment:   In Person  Provider:   You may see Kate Sable, MD or one of the following Advanced Practice Providers on your designated Care Team:   Murray Hodgkins, NP Christell Faith, PA-C Cadence Kathlen Mody, PA-C Gerrie Nordmann, NP

## 2022-08-18 ENCOUNTER — Telehealth: Payer: Self-pay

## 2022-08-18 ENCOUNTER — Ambulatory Visit: Payer: Commercial Managed Care - PPO

## 2022-08-18 NOTE — Telephone Encounter (Signed)
No show. Called pt cell phone but unable to leave a message.

## 2022-08-19 ENCOUNTER — Encounter
Admission: RE | Admit: 2022-08-19 | Discharge: 2022-08-19 | Disposition: A | Discharge status: Home / Self Care | Payer: Commercial Managed Care - PPO | Source: Ambulatory Visit | Attending: Cardiology | Admitting: Cardiology

## 2022-08-19 DIAGNOSIS — Z01810 Encounter for preprocedural cardiovascular examination: Secondary | ICD-10-CM | POA: Diagnosis not present

## 2022-08-19 LAB — NM MYOCAR MULTI W/SPECT W/WALL MOTION / EF
LV dias vol: 47 mL (ref 62–150)
LV sys vol: 14 mL
Nuc Stress EF: 70 %
Peak HR: 136 {beats}/min
Percent HR: 86 %
Rest HR: 81 {beats}/min
Rest Nuclear Isotope Dose: 10.1 mCi
SDS: 5
SRS: 0
SSS: 2
ST Depression (mm): 0 mm
Stress Nuclear Isotope Dose: 31.9 mCi
TID: 1.03

## 2022-08-19 MED ORDER — TECHNETIUM TC 99M TETROFOSMIN IV KIT
31.9400 | PACK | Freq: Once | INTRAVENOUS | Status: AC | PRN
  Administered 2022-08-19: 31.94 via INTRAVENOUS

## 2022-08-19 MED ORDER — REGADENOSON 0.4 MG/5ML IV SOLN
0.4000 mg | Freq: Once | INTRAVENOUS | Status: AC
  Administered 2022-08-19: 0.4 mg via INTRAVENOUS

## 2022-08-19 MED ORDER — TECHNETIUM TC 99M TETROFOSMIN IV KIT
10.0000 | PACK | Freq: Once | INTRAVENOUS | Status: AC
  Administered 2022-08-19: 10.14 via INTRAVENOUS

## 2022-08-20 ENCOUNTER — Ambulatory Visit: Payer: Commercial Managed Care - PPO

## 2022-08-24 ENCOUNTER — Other Ambulatory Visit
Admission: RE | Admit: 2022-08-24 | Discharge: 2022-08-24 | Disposition: A | Discharge status: Home / Self Care | Payer: Commercial Managed Care - PPO | Attending: Cardiology | Admitting: Cardiology

## 2022-08-24 ENCOUNTER — Ambulatory Visit: Payer: Commercial Managed Care - PPO | Attending: Cardiology

## 2022-08-24 DIAGNOSIS — Z01810 Encounter for preprocedural cardiovascular examination: Secondary | ICD-10-CM

## 2022-08-24 DIAGNOSIS — E785 Hyperlipidemia, unspecified: Secondary | ICD-10-CM | POA: Diagnosis not present

## 2022-08-24 LAB — ECHOCARDIOGRAM COMPLETE
AR max vel: 3.04 cm2
AV Area VTI: 3.05 cm2
AV Area mean vel: 2.91 cm2
AV Mean grad: 4 mmHg
AV Peak grad: 6.2 mmHg
Ao pk vel: 1.24 m/s
Area-P 1/2: 2.07 cm2
S' Lateral: 3.2 cm

## 2022-08-24 LAB — LIPID PANEL
Cholesterol: 175 mg/dL (ref 0–200)
HDL: 74 mg/dL (ref >40)
LDL Cholesterol: 89 mg/dL (ref 0–99)
Total CHOL/HDL Ratio: 2.4 RATIO
Triglycerides: 62 mg/dL (ref <150)
VLDL: 12 mg/dL (ref 0–40)

## 2022-08-27 ENCOUNTER — Ambulatory Visit: Payer: Commercial Managed Care - PPO

## 2022-08-27 DIAGNOSIS — R262 Difficulty in walking, not elsewhere classified: Secondary | ICD-10-CM

## 2022-08-27 DIAGNOSIS — M5459 Other low back pain: Secondary | ICD-10-CM

## 2022-08-27 DIAGNOSIS — M5412 Radiculopathy, cervical region: Secondary | ICD-10-CM

## 2022-08-27 DIAGNOSIS — Z9181 History of falling: Secondary | ICD-10-CM

## 2022-08-27 DIAGNOSIS — R2681 Unsteadiness on feet: Secondary | ICD-10-CM

## 2022-08-27 DIAGNOSIS — M6281 Muscle weakness (generalized): Secondary | ICD-10-CM

## 2022-08-27 DIAGNOSIS — M542 Cervicalgia: Secondary | ICD-10-CM
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
## 2022-08-31 ENCOUNTER — Telehealth (INDEPENDENT_AMBULATORY_CARE_PROVIDER_SITE_OTHER): Payer: Self-pay

## 2022-08-31 NOTE — Telephone Encounter (Signed)
Spoke with the patient and his spouse earlier and offered 09/02/22, 09/09/22 and 09/23/22. The patient choose 09/09/22 and he is scheduled with Dr. Delana Meyer with [facility] 9[facility]30 [facility]. Pre-op phone call is on 09/02/22 between 8-1 pm. Pre-surgical instructions were discussed and will be mailed.

## 2022-09-01 ENCOUNTER — Other Ambulatory Visit (INDEPENDENT_AMBULATORY_CARE_PROVIDER_SITE_OTHER): Payer: Self-pay | Admitting: Nurse Practitioner

## 2022-09-01 ENCOUNTER — Telehealth (INDEPENDENT_AMBULATORY_CARE_PROVIDER_SITE_OTHER): Payer: Self-pay

## 2022-09-01 DIAGNOSIS — I7143 Infrarenal abdominal aortic aneurysm, without rupture: Secondary | ICD-10-CM

## 2022-09-01 NOTE — Telephone Encounter (Signed)
Reached out to the patient to ask if he wanted to reschedule to 09/02/22 for his AAA. The patient declined stated he wanted to stay where he was.

## 2022-09-02 ENCOUNTER — Encounter
Admission: RE | Admit: 2022-09-02 | Discharge: 2022-09-02 | Disposition: A | Discharge status: Home / Self Care | Payer: Commercial Managed Care - PPO | Source: Ambulatory Visit | Attending: Vascular Surgery | Admitting: Vascular Surgery

## 2022-09-02 ENCOUNTER — Other Ambulatory Visit: Payer: Self-pay

## 2022-09-02 HISTORY — DX: Atherosclerotic heart disease of native coronary artery without angina pectoris: I25.10

## 2022-09-02 NOTE — Patient Instructions (Addendum)
Your procedure is scheduled on: 09/09/22 - Wednesday Report to the Registration Desk on the 1st floor of the [facility]. To find out your arrival time, please call [phone] between 1PM - 3PM on: 09/08/22 - Tuesday If your arrival time is 6:00 am, do not arrive prior to that time as the [facility] entrance doors do not open until 6:00 am.  REMEMBER: Instructions that are not followed completely may result in serious medical risk, up to and including death; or upon the discretion of your surgeon and anesthesiologist your surgery may need to be rescheduled.  Do not eat food or drink any liquids after midnight the night before surgery.  No gum chewing, lozengers or hard candies.  TAKE THESE MEDICATIONS THE MORNING OF SURGERY WITH A SIP OF WATER: - atorvastatin (LIPITOR)   One week prior to surgery: Stop Anti-inflammatories (NSAIDS) such as Advil, Aleve, Ibuprofen, Motrin, Naproxen, Naprosyn and Aspirin based products such as Excedrin, Goodys Powder, BC Powder.  Stop ANY OVER THE COUNTER supplements until after surgery.  You may however, continue to take Tylenol if needed for pain up until the day of surgery.  No Alcohol for 24 hours before or after surgery.  No Smoking including e-cigarettes for 24 hours prior to surgery.  No chewable tobacco products for at least 6 hours prior to surgery.  No nicotine patches on the day of surgery.  Do not use any "recreational" drugs for at least a week prior to your surgery.  Please be advised that the combination of cocaine and anesthesia may have negative outcomes, up to and including death. If you test positive for cocaine, your surgery will be cancelled.  On the morning of surgery brush your teeth with toothpaste and water, you may rinse your mouth with mouthwash if you wish. Do not swallow any toothpaste or mouthwash.  Use CHG Soap or wipes as directed on instruction sheet.  Do not wear jewelry, make-up, hairpins, clips or nail  polish.  Do not wear lotions, powders, or perfumes.   Do not shave body from the neck down 48 hours prior to surgery just in case you cut yourself which could leave a site for infection.  Also, freshly shaved skin may become irritated if using the CHG soap.  Contact lenses, hearing aids and dentures may not be worn into surgery.  Do not bring valuables to the hospital. [facility] is not responsible for any missing/lost belongings or valuables.   Notify your doctor if there is any change in your medical condition (cold, fever, infection).  Wear comfortable clothing (specific to your surgery type) to the hospital.  After surgery, you can help prevent lung complications by doing breathing exercises.  Take deep breaths and cough every 1-2 hours. Your doctor may order a device called an Incentive Spirometer to help you take deep breaths. When coughing or sneezing, hold a pillow firmly against your incision with both hands. This is called "splinting." Doing this helps protect your incision. It also decreases belly discomfort.  If you are being admitted to the hospital overnight, leave your suitcase in the car. After surgery it may be brought to your room.  If you are being discharged the day of surgery, you will not be allowed to drive home. You will need a responsible adult (18 years or older) to drive you home and stay with you that night.   If you are taking public transportation, you will need to have a responsible adult (18 years or older) with  you. Please confirm with your physician that it is acceptable to use public transportation.   Please call the [facility] Dept. at [phone] if you have any questions about these instructions.  Surgery Visitation Policy:  Patients undergoing a surgery or procedure may have two family members or support persons with them as long as the person is not COVID-19 positive or experiencing its symptoms.   Inpatient Visitation:     Visiting hours are 7 a.m. to 8 p.m. Up to four visitors are allowed at one time in a patient room. The visitors may rotate out with other people during the day. One designated support person (adult) may remain overnight.  Due to an increase in RSV and influenza rates and associated hospitalizations, children ages 35 and under will not be able to visit patients in [facility]. Masks continue to be strongly recommended.

## 2022-09-03 ENCOUNTER — Ambulatory Visit: Payer: Commercial Managed Care - PPO

## 2022-09-07 ENCOUNTER — Encounter: Payer: Self-pay | Admitting: Vascular Surgery

## 2022-09-07 ENCOUNTER — Encounter
Admission: RE | Admit: 2022-09-07 | Discharge: 2022-09-07 | Disposition: A | Discharge status: Home / Self Care | Payer: Commercial Managed Care - PPO | Source: Ambulatory Visit | Attending: Vascular Surgery | Admitting: Vascular Surgery

## 2022-09-07 DIAGNOSIS — I7143 Infrarenal abdominal aortic aneurysm, without rupture: Secondary | ICD-10-CM

## 2022-09-07 DIAGNOSIS — Z01812 Encounter for preprocedural laboratory examination: Secondary | ICD-10-CM | POA: Insufficient documentation

## 2022-09-07 LAB — TYPE AND SCREEN
ABO/RH(D): O POS
Antibody Screen: NEGATIVE

## 2022-09-07 LAB — CBC WITH DIFFERENTIAL/PLATELET
Abs Immature Granulocytes: 0.01 10*3/uL (ref 0.00–0.07)
Basophils Absolute: 0.1 10*3/uL (ref 0.0–0.1)
Basophils Relative: 1 %
Eosinophils Absolute: 0 10*3/uL (ref 0.0–0.5)
Eosinophils Relative: 1 %
HCT: 38.8 % — ABNORMAL LOW (ref 39.0–52.0)
Hemoglobin: 13.1 g/dL (ref 13.0–17.0)
Immature Granulocytes: 0 %
Lymphocytes Relative: 31 %
Lymphs Abs: 2.1 10*3/uL (ref 0.7–4.0)
MCH: 34 pg (ref 26.0–34.0)
MCHC: 33.8 g/dL (ref 30.0–36.0)
MCV: 100.8 fL — ABNORMAL HIGH (ref 80.0–100.0)
Monocytes Absolute: 0.7 10*3/uL (ref 0.1–1.0)
Monocytes Relative: 11 %
Neutro Abs: 3.9 10*3/uL (ref 1.7–7.7)
Neutrophils Relative %: 56 %
Platelets: 252 10*3/uL (ref 150–400)
RBC: 3.85 MIL/uL — ABNORMAL LOW (ref 4.22–5.81)
RDW: 13.6 % (ref 11.5–15.5)
WBC: 6.8 10*3/uL (ref 4.0–10.5)
nRBC: 0 % (ref 0.0–0.2)

## 2022-09-07 LAB — BASIC METABOLIC PANEL
Anion gap: 12 (ref 5–15)
BUN: 8 mg/dL (ref 8–23)
CO2: 25 mmol/L (ref 22–32)
Calcium: 8.6 mg/dL — ABNORMAL LOW (ref 8.9–10.3)
Chloride: 106 mmol/L (ref 98–111)
Creatinine, Ser: 0.59 mg/dL — ABNORMAL LOW (ref 0.61–1.24)
GFR, Estimated: >60 mL/min (ref >60)
Glucose, Bld: 98 mg/dL (ref 70–99)
Potassium: 3.9 mmol/L (ref 3.5–5.1)
Sodium: 143 mmol/L (ref 135–145)

## 2022-09-07 NOTE — Progress Notes (Addendum)
Perioperative / Anesthesia Services  Pre-Admission Testing Clinical Review / Preoperative Anesthesia Consult  Date: 09/07/22  Patient Demographics:  Name: Ryan Rose DOB:   12-24-1960 MRN:   [id number]  Planned Surgical Procedure(s):    Case: [id number] Date/Time: 09/09/22 1030   Procedure: ENDOVASCULAR REPAIR/STENT GRAFT   Anesthesia type: General   Diagnosis: Abdominal aortic aneurysm (AAA) without rupture, unspecified part ([place]) [I71.40]   Pre-op diagnosis:      AAA   Anesthesia   GORE   AAA Repair     Dr Delana Meyer w [facility] to assist   Location: [facility]   Providers: Katha Cabal, MD   NOTE: Available PAT nursing documentation and vital signs have been reviewed. Clinical nursing staff has updated patient's PMH/PSHx, current medication list, and drug allergies/intolerances to ensure comprehensive history available to assist in medical decision making as it pertains to the aforementioned surgical procedure and anticipated anesthetic course. Extensive review of available clinical information personally performed. [place] [facility] and PSHx updated with any diagnoses/procedures that  may have been inadvertently omitted during his intake with the pre-admission testing department's nursing staff.  Clinical Discussion:  Ryan Rose is a 62 y.o. male who is submitted for pre-surgical anesthesia review and clearance prior to him undergoing the above procedure. Patient is a Current Smoker (4.5 pack years). Pertinent PMH includes: CAD, infrarenal AAA, diastolic dysfunction, PVD, aortic atherosclerosis. HTN, HLD, anemia, ETOH abuse.  Patient is followed by cardiology Garen Lah, MD). He was last seen in the cardiology clinic on 08/13/2022; notes reviewed. At the time of his clinic visit, patient doing well overall from a cardiovascular perspective. He denied any acute cardiovascular symptoms/complaints. Patient denied any chest pain, shortness of breath, PND,  orthopnea, palpitations, significant peripheral edema, weakness, fatigue, vertiginous symptoms, or presyncope/syncope. Patient with a past medical history of cardiovascular diagnoses. Documented physical exam was grossly benign, providing no evidence of acute exacerbation of the patient's documented cardiovascular conditions.   Patient with known infrarenal AAA.  CT abdomen and pelvis performed on 05/28/2022 revealed a thrombosed saccular aneurysm measuring 4.4 x 3.2 cm.  Blood pressure reasonably controlled at 138/90 mmHg on currently prescribed ARB (losartan) monotherapy.  Patient not currently taking any type of lipid-lowering therapies for his HLD diagnosis and ASCVD prevention. Patient is not diabetic  [place] not have an OSAH diagnosis. Functional capacity, as defined by DASI, is documented as being >/= 4 METS.  Patient was started on atorvastatin 40 mg and ASA 81 mg daily.  Patient was scheduled for further cardiovascular testing prior to upcoming surgery.  Patient to follow-up with outpatient cardiology following testing for ongoing care management.  Since patient was seen by cardiology, he has undergone the recommended noninvasive cardiovascular studies as follows:  Myocardial perfusion imaging study performed on 08/19/2022 revealed normal left ventricular systolic function with a hyperdynamic LVEF of 75%.  Three-vessel coronary artery disease noted.  There was no evidence of stress-induced myocardial ischemia or arrhythmia; no scintigraphic evidence of scar.  Study determined to be low risk overall.  TTE performed on 08/24/2022 revealed a normal left ventricular systolic function with an EF of 60-65%.  There was mild LVH.  There were no regional wall motion abnormalities. Left ventricular diastolic Doppler parameters consistent with abnormal relaxation (G1DD).  Right ventricular size and function normal.  There was mild to moderate mitral valve regurgitation.  There was no evidence of any  significant transvalvular gradient suggestive of valvular stenosis.  Ryan Rose is scheduled  for an ENDOVASCULAR REPAIR/STENT GRAFT on 09/09/2022 with Dr. Levora Dredge, MD.  Given patient's past medical history significant for cardiovascular diagnoses, presurgical cardiac clearance was sought by the PAT team. Per cardiology, "echocardiogram shows normal systolic function with mild mitral valve regurgitation.  There was no significant ischemia noted on Lexiscan Myoview.  This patient is optimized for surgery and may proceed with the planned procedural course with a LOW risk of significant perioperative cardiovascular complications".   In review of his medication reconciliation, it is noted that patient is currently on prescribed daily antiplatelet therapy.  Per standing orders for this procedure from Dr. Gilda Crease, patient will continue his daily low-dose ASA throughout his perioperative course.  Patient denies previous perioperative complications with anesthesia in the past. In review of the available records, it is noted that patient underwent a general  anesthetic course here at [facility] (ASA II) in 08/2021 without documented complications.      08/13/2022   11:07 AM 08/13/2022   11:01 AM 07/13/2022    1:36 PM  Vitals with BMI  Height  5' 8.5"   Weight  160 lbs 6 oz 164 lbs 10 oz  BMI  24.03   Systolic 138 138 016  Diastolic 90 90 90  Pulse  92 102    Providers/Specialists:   NOTE: Primary physician provider listed below. Patient may have been seen by APP or partner within same practice.   PROVIDER ROLE / SPECIALTY LAST OV  Schnier, Latina Craver, MD Vascular Surgery (Surgeon) 07/13/2022  Sherron Monday, MD Primary Care Provider ???  Debbe Odea, MD Cardiology 08/13/2022   Allergies:  Sulfa antibiotics  Current Home Medications:   No current facility-administered medications for this encounter.    acetaminophen (TYLENOL) 500 MG  tablet   aspirin 81 MG chewable tablet   atorvastatin (LIPITOR) 40 MG tablet   losartan (COZAAR) 25 MG tablet   senna-docusate (SENOKOT-S) 8.6-50 MG tablet   traMADol (ULTRAM) 50 MG tablet   History:   Past Medical History:  Diagnosis Date   Anemia    Aortic atherosclerosis (HCC)    Arthritis    Bilateral carpal tunnel syndrome    Coronary artery disease    Diastolic dysfunction    a.) TTE 08/24/2022: EF 60-65%, mild-mod MR, G1DD.   Elevated blood pressure reading    ETOH abuse    Hepatic steatosis    History of methicillin resistant staphylococcus aureus (MRSA)    HLD (hyperlipidemia)    Infrarenal abdominal aortic aneurysm (AAA) without rupture (HCC) 05/28/2022   a.) CT AP 05/28/2022: saccular thrombosed; measures 4.4 x 3.2 cm   PVD (peripheral vascular disease) with claudication (HCC)    Sigmoid diverticulosis    Transaminitis    Past Surgical History:  Procedure Laterality Date   CERVICAL LAMINOPLASTY  08/20/2021   right C3-C6   COLONOSCOPY     SKIN GRAFT     to hand and forearm   Family History  Problem Relation Age of Onset   Heart attack Mother    Social History   Tobacco Use   Smoking status: Some Days    Packs/day: 0.10    Years: 45.00    Total pack years: 4.50    Types: Cigarettes   Smokeless tobacco: Never   Tobacco comments:    Smokes 5 cigarettes weekly  Vaping Use   Vaping Use: Never used  Substance Use Topics   Alcohol use: Yes    Comment: occasional   Drug  use: Yes    Types: Marijuana    Comment: occasionally    Pertinent Clinical Results:  LABS: Labs reviewed: Acceptable for surgery.  Hospital Outpatient Visit on 09/07/2022  Component Date Value Ref Range Status   ABO/RH(D) 09/07/2022 O POS   Final   Antibody Screen 09/07/2022 NEG   Final   Sample Expiration 09/07/2022 09/21/2022,2359   Final   Extend sample reason 09/07/2022    Final                   Value:NO TRANSFUSIONS OR PREGNANCY IN THE PAST 3 MONTHS Performed at [facility], [address] [place], [place] [address]    Sodium 09/07/2022 143  135 - 145 mmol/L Final   Potassium 09/07/2022 3.9  3.5 - 5.1 mmol/L Final   Chloride 09/07/2022 106  98 - 111 mmol/L Final   CO2 09/07/2022 25  22 - 32 mmol/L Final   Glucose, Bld 09/07/2022 98  70 - 99 mg/dL Final   Glucose reference range applies only to samples taken after fasting for at least 8 hours.   BUN 09/07/2022 8  8 - 23 mg/dL Final   Creatinine, Ser 09/07/2022 0.59 (L)  0.61 - 1.24 mg/dL Final   Calcium 60/73/7106 8.6 (L)  8.9 - 10.3 mg/dL Final   GFR, Estimated 09/07/2022 >60  >60 mL/min Final   Comment: (NOTE) Calculated using the CKD-EPI Creatinine Equation (2021)    Anion gap 09/07/2022 12  5 - 15 Final   Performed at [facility], [address] [place], [place] [address]   WBC 09/07/2022 6.8  4.0 - 10.5 K/uL Final   RBC 09/07/2022 3.85 (L)  4.22 - 5.81 MIL/uL Final   Hemoglobin 09/07/2022 13.1  13.0 - 17.0 g/dL Final   HCT 54/62/7035 38.8 (L)  39.0 - 52.0 % Final   MCV 09/07/2022 100.8 (H)  80.0 - 100.0 fL Final   MCH 09/07/2022 34.0  26.0 - 34.0 pg Final   MCHC 09/07/2022 33.8  30.0 - 36.0 g/dL Final   RDW 00/93/8182 13.6  11.5 - 15.5 % Final   Platelets 09/07/2022 252  150 - 400 K/uL Final   nRBC 09/07/2022 0.0  0.0 - 0.2 % Final   Neutrophils Relative % 09/07/2022 56  % Final   Neutro Abs 09/07/2022 3.9  1.7 - 7.7 K/uL Final   Lymphocytes Relative 09/07/2022 31  % Final   Lymphs Abs 09/07/2022 2.1  0.7 - 4.0 K/uL Final   Monocytes Relative 09/07/2022 11  % Final   Monocytes Absolute 09/07/2022 0.7  0.1 - 1.0 K/uL Final   Eosinophils Relative 09/07/2022 1  % Final   Eosinophils Absolute 09/07/2022 0.0  0.0 - 0.5 K/uL Final   Basophils Relative 09/07/2022 1  % Final   Basophils Absolute 09/07/2022 0.1  0.0 - 0.1 K/uL Final   Immature Granulocytes 09/07/2022 0  % Final   Abs Immature Granulocytes 09/07/2022 0.01  0.00 - 0.07 K/uL Final   Performed at [facility], [address] [place], [place] [address]    ECG: Date: 08/13/2022 Time ECG obtained: 1106 AM Rate: 92 bpm Rhythm: normal sinus Axis (leads I and aVF): Normal Intervals: PR 154 ms. QRS 83 ms. QTc 429 ms. ST segment and T wave changes: No evidence of acute ST segment elevation or depression Comparison: Similar to previous tracing obtained on 05/28/2022 (ST at rate of 124 bpm)   IMAGING / PROCEDURES: TRANSTHORACIC ECHOCARDIOGRAM performed on 08/24/2022  Left ventricular ejection fraction, by estimation, is 60 to 65%. The left ventricle has normal function. The left ventricle has no regional  wall motion abnormalities. There is mild left ventricular hypertrophy. Left ventricular diastolic parameters are consistent with Grade I diastolic dysfunction (impaired relaxation).  Right ventricular systolic function is normal. The right ventricular size is normal.  The mitral valve is normal in structure. Mild to moderate mitral valve regurgitation.  The aortic valve is tricuspid. Aortic valve regurgitation is not visualized.  The inferior vena cava is normal in size with greater than 50% respiratory variability, suggesting right atrial pressure of 3 mmHg.   MYOCARDIAL PERFUSION IMAGING STUDY (LEXISCAN) performed on 08/19/2022 Normal left ventricular systolic function with a hyperdynamic LVEF of 75% Normal myocardial thickening and wall motion Left ventricular cavity size normal Three-vessel coronary artery calcification SPECT images demonstrate homogenous tracer distribution throughout the myocardium No evidence of stress-induced myocardial ischemia or arrhythmia Low risk study  VAS [place] ABI WITH/WO TBI performed on 07/10/2022 Resting right ankle-brachial index is within normal range. The  right toe-brachial index is normal.  Resting left ankle-brachial index is within normal range. The left toe-brachial index is normal.   CT ABDOMEN PELVIS W CONTRAST performed on  05/28/2022 Acute sigmoid diverticulitis involving a large diverticulum with adjacent peritoneal thickening, likely reactive peritonitis. The medial aspect this diverticulitis closely abuts the dome of the bladder, which may predispose to fistula formation. Attention on follow-up. 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 Recommend follow-up every 12 months and vascular consultation. This recommendation follows ACR consensus guidelines: White Paper of the ACR Incidental Findings Committee II on Vascular Findings. J Am Coll Radiol 2013; 10:789-794. Hepatic steatosis. Coronary artery calcifications.  Aortic atherosclerosis   Impression and Plan:  Ryan Rose has been referred for pre-anesthesia review and clearance prior to him undergoing the planned anesthetic and procedural courses. Available labs, pertinent testing, and imaging results were personally reviewed by me in preparation for upcoming operative/procedural course. [facility] medical record has been updated following extensive record review and patient interview with PAT staff.   This patient has been appropriately cleared by cardiology with an overall ACCEPTABLE risk of significant perioperative cardiovascular complications. Based on clinical review performed today (09/07/22), barring any significant acute changes in the patient's overall condition, it is anticipated that he will be able to proceed with the planned surgical intervention. Any acute changes in clinical condition may necessitate his procedure being postponed and/or cancelled. Patient will meet with anesthesia team (MD and/or CRNA) on the day of his procedure for preoperative evaluation/assessment. Questions regarding anesthetic course will be fielded at that time.   Pre-surgical instructions were reviewed with the patient during his PAT appointment, and questions were fielded  to satisfaction by PAT clinical staff. He has been instructed on which medications that he will need to hold prior to surgery, as well as the ones that have been deemed safe/appropriate to take of the day of his procedure. As part of the general education provided by PAT, patient made aware both verbally and in writing, that he will need to abstain from the use of any illegal substances during his perioperative course. He was advised that failure to do so could necessitate the need for case cancellation or result serious perioperative complications up to and including death. Patient encouraged to contact PAT and/or his surgeon's office to discuss any questions or concerns that may arise prior to  surgery; verbalized understanding.   Honor Loh, MSN, APRN, FNP-C, CEN [facility]  Peri-operative Services Nurse Practitioner Phone: [phone] Fax: [phone] 09/07/22 11:33 AM  NOTE: This note has been prepared using Dragon dictation software. Despite my best ability to proofread, there is always the potential that unintentional transcriptional errors may still occur from this process.

## 2022-09-08 ENCOUNTER — Encounter: Payer: Self-pay | Admitting: Cardiology

## 2022-09-08 ENCOUNTER — Ambulatory Visit: Payer: Commercial Managed Care - PPO | Attending: Cardiology | Admitting: Cardiology

## 2022-09-08 VITALS — BP 122/60 | HR 92 | Ht 68.5 in | Wt 160.0 lb

## 2022-09-08 DIAGNOSIS — F17200 Nicotine dependence, unspecified, uncomplicated: Secondary | ICD-10-CM | POA: Diagnosis not present

## 2022-09-08 DIAGNOSIS — I10 Essential (primary) hypertension: Secondary | ICD-10-CM

## 2022-09-08 DIAGNOSIS — Z01810 Encounter for preprocedural cardiovascular examination: Secondary | ICD-10-CM | POA: Diagnosis not present

## 2022-09-08 DIAGNOSIS — I2510 Atherosclerotic heart disease of native coronary artery without angina pectoris: Secondary | ICD-10-CM | POA: Diagnosis not present

## 2022-09-08 NOTE — Patient Instructions (Signed)
Medication Instructions:   Your physician recommends that you continue on your current medications as directed. Please refer to the Current Medication list given to you today.  *If you need a refill on your cardiac medications before your next appointment, please call your pharmacy*   Lab Work:  None ordered  If you have labs (blood work) drawn today and your tests are completely normal, you will receive your results only by: MyChart Message (if you have MyChart) OR A paper copy in the mail If you have any lab test that is abnormal or we need to change your treatment, we will call you to review the results.   Testing/Procedures:  None Ordered    Follow-Up: At [facility], you and your health needs are our priority.  As part of our continuing mission to provide you with exceptional heart care, we have created designated Provider Care Teams.  These Care Teams include your primary Cardiologist (physician) and Advanced Practice Providers (APPs -  Physician Assistants and Nurse Practitioners) who all work together to provide you with the care you need, when you need it.  We recommend signing up for the patient portal called "MyChart".  Sign up information is provided on this After Visit Summary.  MyChart is used to connect with patients for Virtual Visits (Telemedicine).  Patients are able to view lab/test results, encounter notes, upcoming appointments, etc.  Non-urgent messages can be sent to your provider as well.   To learn more about what you can do with MyChart, go to [email or web address]://[email or web address].com.    Your next appointment:   12 month(s)  Provider:   You may see Brian Agbor-Etang, MD or one of the following Advanced Practice Providers on your designated Care Team:   Christopher Berge, NP Ryan Dunn, PA-C Cadence Furth, PA-C Sheri Hammock, NP 

## 2022-09-08 NOTE — Progress Notes (Signed)
Cardiology Office Note:    Date:  09/08/2022   ID:  Ryan Rose, DOB 04/09/1961, MRN [id number]  PCP:  Jodi Marble, MD   [facility] Providers Cardiologist:  Kate Sable, MD     Referring MD: Jodi Marble, MD   Chief Complaint  Patient presents with   Follow-up    Testing F/U, no new cardiac concerns     History of Present Illness:    Ryan Rose is a 62 y.o. male with a hx of CAD (coronary calcifications on chest CT ), hypertension, abdominal aortic aneurysm, current smoker x 50+ years who presents for follow-up.  Previously seen preop evaluation due to AAA.  Endovascular repair is being planned.  He still smokes, is working on quitting.  Echocardiogram and Lexiscan Myoview were obtained to evaluate cardiac function.  Still has some abdominal discomfort as well for which she takes tramadol.  Blood pressure is much improved.  Losartan started after last visit.  Tolerating Lipitor, aspirin as prescribed.  Denies chest pain or shortness of breath.   Past Medical History:  Diagnosis Date   Anemia    Aortic atherosclerosis (HCC)    Arthritis    Bilateral carpal tunnel syndrome    Coronary artery disease    Diastolic dysfunction    a.) TTE 08/24/2022: EF 60-65%, mild-mod MR, G1DD.   ETOH abuse    Hepatic steatosis    History of methicillin resistant staphylococcus aureus (MRSA)    HLD (hyperlipidemia)    HTN (hypertension)    Infrarenal abdominal aortic aneurysm (AAA) without rupture ([place]) 05/28/2022   a.) CT AP 05/28/2022: saccular thrombosed; measures 4.4 x 3.2 cm   PVD (peripheral vascular disease) with claudication (HCC)    Sigmoid diverticulosis    Transaminitis     Past Surgical History:  Procedure Laterality Date   CERVICAL LAMINOPLASTY  08/20/2021   right C3-C6   COLONOSCOPY     SKIN GRAFT     to hand and forearm    Current Medications: Current Meds  Medication Sig   acetaminophen (TYLENOL) 500 MG tablet Take one  or two tablet by mouth every 6  hours as needed for pain   aspirin 81 MG chewable tablet Chew 81 mg by mouth daily as needed.   atorvastatin (LIPITOR) 40 MG tablet Take 1 tablet (40 mg total) by mouth daily.   losartan (COZAAR) 25 MG tablet Take 1 tablet (25 mg total) by mouth daily.   senna-docusate (SENOKOT-S) 8.6-50 MG tablet Take 2 tablets by mouth at bedtime. For constipation.  Also available over-the-counter   traMADol (ULTRAM) 50 MG tablet Take 1 tablet (50 mg total) by mouth every 6 (six) hours as needed for up to 20 doses for moderate pain or severe pain.     Allergies:   Sulfa antibiotics   Social History   Socioeconomic History   Marital status: Married    Spouse name: [name]   Number of children: Not on file   Years of education: Not on file   Highest education level: Not on file  Occupational History   Not on file  Tobacco Use   Smoking status: Some Days    Packs/day: 0.10    Years: 45.00    Total pack years: 4.50    Types: Cigarettes   Smokeless tobacco: Never   Tobacco comments:    Smokes 5 cigarettes weekly  Vaping Use   Vaping Use: Never used  Substance and Sexual Activity   Alcohol use:  Yes    Comment: occasional   Drug use: Yes    Types: Marijuana    Comment: occasionally   Sexual activity: Not on file  Other Topics Concern   Not on file  Social History Narrative   Not on file   Social Determinants of Health   Financial Resource Strain: Not on file  Food Insecurity: No Food Insecurity (05/29/2022)   Hunger Vital Sign    Worried About Running Out of Food in the Last Year: Never true    Ran Out of Food in the Last Year: Never true  Transportation Needs: No Transportation Needs (05/29/2022)   PRAPARE - Hydrologist (Medical): No    Lack of Transportation (Non-Medical): No  Physical Activity: Not on file  Stress: Not on file  Social Connections: Not on file     Family History: The patient's family history includes  Heart attack in his mother.  ROS:   Please see the history of present illness.     All other systems reviewed and are negative.  EKGs/Labs/Other Studies Reviewed:    The following studies were reviewed today:   EKG:  EKG not ordered today.    Recent Labs: 05/30/2022: ALT 26 09/07/2022: BUN 8; Creatinine, Ser 0.59; Hemoglobin 13.1; Platelets 252; Potassium 3.9; Sodium 143  Recent Lipid Panel    Component Value Date/Time   CHOL 175 08/24/2022 0846   TRIG 62 08/24/2022 0846   HDL 74 08/24/2022 0846   CHOLHDL 2.4 08/24/2022 0846   VLDL 12 08/24/2022 0846   LDLCALC 89 08/24/2022 0846     Risk Assessment/Calculations:              Physical Exam:    VS:  BP 122/60 (BP Location: Right Arm, Patient Position: Sitting, Cuff Size: Normal)   Pulse 92   Ht 5' 8.5" (1.74 m)   Wt 160 lb (72.6 kg)   SpO2 98%   BMI 23.97 kg/m     Wt Readings from Last 3 Encounters:  09/08/22 160 lb (72.6 kg)  08/13/22 160 lb 6.4 oz (72.8 kg)  07/13/22 164 lb 9.6 oz (74.7 kg)     GEN:  Well nourished, well developed in no acute distress HEENT: Normal NECK: No JVD; No carotid bruits CARDIAC: RRR, no murmurs, rubs, gallops RESPIRATORY:  Clear to auscultation without rales, wheezing or rhonchi  ABDOMEN: Soft, non-tender, non-distended MUSCULOSKELETAL:  No edema; No deformity  SKIN: Warm and dry NEUROLOGIC:  Alert and oriented x 3 PSYCHIATRIC:  Normal affect   ASSESSMENT:    1. Pre-operative cardiovascular examination   2. Coronary artery disease involving native coronary artery of native heart without angina pectoris   3. Primary hypertension   4. Smoking     PLAN:    In order of problems listed above:  Preop evaluation, abdominal aortic aneurysm repair being planned.  Echo with normal EF 60 to 65%, Lexiscan Myoview with no significant ischemia.  Okay to proceed with procedure from a cardiac perspective. CAD/three-vessel coronary artery calcification, echo with normal EF, Myoview  with no ischemia.  Denies chest pain.  Continue aspirin, Lipitor 40 mg daily. Hypertension, BP controlled.  Continue losartan 25 mg daily. Current smoker, smoking cessation advised.  Follow-up in 1 year.     Medication Adjustments/Labs and Tests Ordered: Current medicines are reviewed at length with the patient today.  Concerns regarding medicines are outlined above.  No orders of the defined types were placed in this encounter.  No  orders of the defined types were placed in this encounter.   Patient Instructions  Medication Instructions:   Your physician recommends that you continue on your current medications as directed. Please refer to the Current Medication list given to you today.  *If you need a refill on your cardiac medications before your next appointment, please call your pharmacy*   Lab Work:  None ordered  If you have labs (blood work) drawn today and your tests are completely normal, you will receive your results only by: [place] (if you have MyChart) OR A paper copy in the mail If you have any lab test that is abnormal or we need to change your treatment, we will call you to review the results.   Testing/Procedures:  None Ordered   Follow-Up: At [facility], you and your health needs are our priority.  As part of our continuing mission to provide you with exceptional heart care, we have created designated Provider Care Teams.  These Care Teams include your primary Cardiologist (physician) and Advanced Practice Providers (APPs -  Physician Assistants and Nurse Practitioners) who all work together to provide you with the care you need, when you need it.  We recommend signing up for the patient portal called "MyChart".  Sign up information is provided on this After Visit Summary.  MyChart is used to connect with patients for Virtual Visits (Telemedicine).  Patients are able to view lab/test results, encounter notes, upcoming appointments, etc.   Non-urgent messages can be sent to your provider as well.   To learn more about what you can do with MyChart, go to [email or web address].    Your next appointment:   12 month(s)  Provider:   You may see Kate Sable, MD or one of the following Advanced Practice Providers on your designated Care Team:   Murray Hodgkins, NP Christell Faith, PA-C Cadence Kathlen Mody, PA-C Gerrie Nordmann, NP   Signed, Kate Sable, MD  09/08/2022 9:15 AM    [place]

## 2022-09-09 ENCOUNTER — Encounter
Admission: RE | Disposition: A | Discharge status: Home / Self Care | Payer: Self-pay | Source: Home / Self Care | Attending: Vascular Surgery

## 2022-09-09 ENCOUNTER — Inpatient Hospital Stay: Payer: Commercial Managed Care - PPO | Admitting: Urgent Care

## 2022-09-09 ENCOUNTER — Encounter: Payer: Self-pay | Admitting: Vascular Surgery

## 2022-09-09 ENCOUNTER — Other Ambulatory Visit: Payer: Self-pay

## 2022-09-09 ENCOUNTER — Inpatient Hospital Stay
Admission: RE | Admit: 2022-09-09 | Discharge: 2022-09-10 | DRG: 269 | Disposition: A | Discharge status: Home / Self Care | Payer: Commercial Managed Care - PPO | Attending: Vascular Surgery | Admitting: Vascular Surgery

## 2022-09-09 DIAGNOSIS — Z882 Allergy status to sulfonamides status: Secondary | ICD-10-CM

## 2022-09-09 DIAGNOSIS — I2510 Atherosclerotic heart disease of native coronary artery without angina pectoris: Secondary | ICD-10-CM | POA: Diagnosis present

## 2022-09-09 DIAGNOSIS — Z7982 Long term (current) use of aspirin: Secondary | ICD-10-CM

## 2022-09-09 DIAGNOSIS — I70213 Atherosclerosis of native arteries of extremities with intermittent claudication, bilateral legs: Secondary | ICD-10-CM | POA: Diagnosis not present

## 2022-09-09 DIAGNOSIS — I714 Abdominal aortic aneurysm, without rupture, unspecified: Secondary | ICD-10-CM | POA: Diagnosis not present

## 2022-09-09 DIAGNOSIS — F17210 Nicotine dependence, cigarettes, uncomplicated: Secondary | ICD-10-CM | POA: Diagnosis present

## 2022-09-09 DIAGNOSIS — I10 Essential (primary) hypertension: Secondary | ICD-10-CM | POA: Diagnosis present

## 2022-09-09 DIAGNOSIS — F1010 Alcohol abuse, uncomplicated: Secondary | ICD-10-CM | POA: Diagnosis present

## 2022-09-09 DIAGNOSIS — Z79899 Other long term (current) drug therapy: Secondary | ICD-10-CM | POA: Diagnosis not present

## 2022-09-09 DIAGNOSIS — Z8249 Family history of ischemic heart disease and other diseases of the circulatory system: Secondary | ICD-10-CM

## 2022-09-09 DIAGNOSIS — E782 Mixed hyperlipidemia: Secondary | ICD-10-CM | POA: Diagnosis not present

## 2022-09-09 HISTORY — DX: Peripheral vascular disease, unspecified: I73.9

## 2022-09-09 HISTORY — DX: Fatty (change of) liver, not elsewhere classified: K76.0

## 2022-09-09 HISTORY — DX: Elevation of levels of liver transaminase levels: R74.01

## 2022-09-09 HISTORY — DX: Other ill-defined heart diseases: I51.89

## 2022-09-09 HISTORY — DX: Diverticulosis of large intestine without perforation or abscess without bleeding: K57.30

## 2022-09-09 HISTORY — DX: Hyperlipidemia, unspecified: E78.5

## 2022-09-09 HISTORY — DX: Carpal tunnel syndrome, bilateral upper limbs: G56.03

## 2022-09-09 HISTORY — DX: Atherosclerosis of aorta: I70.0

## 2022-09-09 HISTORY — DX: Alcohol abuse, uncomplicated: F10.10

## 2022-09-09 HISTORY — DX: Essential (primary) hypertension: I10

## 2022-09-09 HISTORY — DX: Anemia, unspecified: D64.9

## 2022-09-09 HISTORY — PX: ENDOVASCULAR REPAIR/STENT GRAFT: CATH118280

## 2022-09-09 LAB — BASIC METABOLIC PANEL
Anion gap: 14 (ref 5–15)
BUN: 9 mg/dL (ref 8–23)
CO2: 23 mmol/L (ref 22–32)
Calcium: 7.9 mg/dL — ABNORMAL LOW (ref 8.9–10.3)
Chloride: 106 mmol/L (ref 98–111)
Creatinine, Ser: 0.58 mg/dL — ABNORMAL LOW (ref 0.61–1.24)
GFR, Estimated: >60 mL/min (ref >60)
Glucose, Bld: 87 mg/dL (ref 70–99)
Potassium: 3.9 mmol/L (ref 3.5–5.1)
Sodium: 143 mmol/L (ref 135–145)

## 2022-09-09 LAB — APTT: aPTT: >200 seconds (ref 24–36)

## 2022-09-09 LAB — GLUCOSE, CAPILLARY: Glucose-Capillary: 70 mg/dL (ref 70–99)

## 2022-09-09 LAB — CBC
HCT: 31.8 % — ABNORMAL LOW (ref 39.0–52.0)
Hemoglobin: 10.8 g/dL — ABNORMAL LOW (ref 13.0–17.0)
MCH: 34.5 pg — ABNORMAL HIGH (ref 26.0–34.0)
MCHC: 34 g/dL (ref 30.0–36.0)
MCV: 101.6 fL — ABNORMAL HIGH (ref 80.0–100.0)
Platelets: 180 10*3/uL (ref 150–400)
RBC: 3.13 MIL/uL — ABNORMAL LOW (ref 4.22–5.81)
RDW: 13.4 % (ref 11.5–15.5)
WBC: 7.5 10*3/uL (ref 4.0–10.5)
nRBC: 0 % (ref 0.0–0.2)

## 2022-09-09 LAB — MRSA NEXT GEN BY PCR, NASAL: MRSA by PCR Next Gen: NOT DETECTED

## 2022-09-09 LAB — PROTIME-INR
INR: 1.2 (ref 0.8–1.2)
Prothrombin Time: 15.1 seconds (ref 11.4–15.2)

## 2022-09-09 LAB — MAGNESIUM: Magnesium: 1.3 mg/dL — ABNORMAL LOW (ref 1.7–2.4)

## 2022-09-09 SURGERY — ENDOVASCULAR REPAIR/STENT GRAFT
Anesthesia: General

## 2022-09-09 MED ORDER — THIAMINE MONONITRATE 100 MG PO TABS
100.0000 mg | ORAL_TABLET | Freq: Every day | ORAL | Status: DC
  Administered 2022-09-09 – 2022-09-10 (×2): 100 mg via ORAL
  Filled 2022-09-09 (×2): qty 1

## 2022-09-09 MED ORDER — CHLORHEXIDINE GLUCONATE CLOTH 2 % EX PADS
6.0000 | MEDICATED_PAD | Freq: Every day | CUTANEOUS | Status: DC
  Administered 2022-09-10: 6 via TOPICAL

## 2022-09-09 MED ORDER — CHLORHEXIDINE GLUCONATE CLOTH 2 % EX PADS
6.0000 | MEDICATED_PAD | Freq: Once | CUTANEOUS | Status: AC
  Administered 2022-09-09: 6 via TOPICAL

## 2022-09-09 MED ORDER — PROPOFOL 10 MG/ML IV BOLUS
INTRAVENOUS | Status: AC
  Filled 2022-09-09: qty 20

## 2022-09-09 MED ORDER — LIDOCAINE HCL (CARDIAC) PF 100 MG/5ML IV SOSY
PREFILLED_SYRINGE | INTRAVENOUS | Status: DC | PRN
  Administered 2022-09-09: 100 mg via INTRAVENOUS

## 2022-09-09 MED ORDER — ONDANSETRON HCL 4 MG/2ML IJ SOLN: 4.0000 mg | Freq: Once | INTRAMUSCULAR | Status: DC | PRN

## 2022-09-09 MED ORDER — MIDAZOLAM HCL 2 MG/2ML IJ SOLN
INTRAMUSCULAR | Status: AC
  Filled 2022-09-09: qty 2

## 2022-09-09 MED ORDER — MAGNESIUM SULFATE 2 GM/50ML IV SOLN
2.0000 g | Freq: Every day | INTRAVENOUS | Status: AC | PRN
  Administered 2022-09-10: 2 g via INTRAVENOUS
  Filled 2022-09-09: qty 50

## 2022-09-09 MED ORDER — DEXAMETHASONE SODIUM PHOSPHATE 10 MG/ML IJ SOLN
INTRAMUSCULAR | Status: DC | PRN
  Administered 2022-09-09: 5 mg via INTRAVENOUS

## 2022-09-09 MED ORDER — IODIXANOL 320 MG/ML IV SOLN
INTRAVENOUS | Status: DC | PRN
  Administered 2022-09-09: 40 mL via INTRA_ARTERIAL

## 2022-09-09 MED ORDER — SORBITOL 70 % SOLN: 30.0000 mL | Freq: Every day | Status: DC | PRN

## 2022-09-09 MED ORDER — ADULT MULTIVITAMIN W/MINERALS CH
1.0000 | ORAL_TABLET | Freq: Every day | ORAL | Status: DC
  Administered 2022-09-09 – 2022-09-10 (×2): 1 via ORAL
  Filled 2022-09-09 (×2): qty 1

## 2022-09-09 MED ORDER — DIPHENHYDRAMINE HCL 12.5 MG/5ML PO LIQD
12.5000 mg | Freq: Once | ORAL | Status: AC
  Administered 2022-09-09: 12.5 mg via ORAL

## 2022-09-09 MED ORDER — ONDANSETRON HCL 4 MG/2ML IJ SOLN
INTRAMUSCULAR | Status: AC
  Filled 2022-09-09: qty 2

## 2022-09-09 MED ORDER — ONDANSETRON HCL 4 MG/2ML IJ SOLN
INTRAMUSCULAR | Status: DC | PRN
  Administered 2022-09-09: 4 mg via INTRAVENOUS

## 2022-09-09 MED ORDER — OXYCODONE-ACETAMINOPHEN 5-325 MG PO TABS
1.0000 | ORAL_TABLET | ORAL | Status: DC | PRN
  Administered 2022-09-09: 2 via ORAL
  Administered 2022-09-09: 1 via ORAL
  Filled 2022-09-09: qty 2
  Filled 2022-09-09: qty 1

## 2022-09-09 MED ORDER — KETAMINE HCL 10 MG/ML IJ SOLN
INTRAMUSCULAR | Status: DC | PRN
  Administered 2022-09-09: 30 mg via INTRAVENOUS
  Administered 2022-09-09: 20 mg via INTRAVENOUS

## 2022-09-09 MED ORDER — FENTANYL CITRATE (PF) 100 MCG/2ML IJ SOLN
INTRAMUSCULAR | Status: AC
  Filled 2022-09-09: qty 2

## 2022-09-09 MED ORDER — MORPHINE SULFATE (PF) 4 MG/ML IV SOLN: 2.0000 mg | INTRAVENOUS | Status: DC | PRN

## 2022-09-09 MED ORDER — ONDANSETRON HCL 4 MG/2ML IJ SOLN: 4.0000 mg | Freq: Four times a day (QID) | INTRAMUSCULAR | Status: DC | PRN

## 2022-09-09 MED ORDER — DOCUSATE SODIUM 100 MG PO CAPS
100.0000 mg | ORAL_CAPSULE | Freq: Every day | ORAL | Status: DC
  Administered 2022-09-10: 100 mg via ORAL
  Filled 2022-09-09: qty 1

## 2022-09-09 MED ORDER — EPHEDRINE 5 MG/ML INJ
INTRAVENOUS | Status: AC
  Filled 2022-09-09: qty 5

## 2022-09-09 MED ORDER — HYDRALAZINE HCL 20 MG/ML IJ SOLN
5.0000 mg | INTRAMUSCULAR | Status: DC | PRN
  Administered 2022-09-10: 5 mg via INTRAVENOUS
  Filled 2022-09-09: qty 1

## 2022-09-09 MED ORDER — ATORVASTATIN CALCIUM 20 MG PO TABS
40.0000 mg | ORAL_TABLET | Freq: Every evening | ORAL | Status: DC
  Administered 2022-09-09: 40 mg via ORAL
  Filled 2022-09-09: qty 2

## 2022-09-09 MED ORDER — MIDAZOLAM HCL 2 MG/2ML IJ SOLN
INTRAMUSCULAR | Status: DC | PRN
  Administered 2022-09-09 (×2): 1 mg via INTRAVENOUS

## 2022-09-09 MED ORDER — MAGNESIUM CITRATE PO SOLN: 1.0000 | Freq: Once | ORAL | Status: DC | PRN

## 2022-09-09 MED ORDER — ASPIRIN 81 MG PO CHEW
81.0000 mg | CHEWABLE_TABLET | Freq: Every day | ORAL | Status: DC
  Administered 2022-09-09: 81 mg via ORAL
  Filled 2022-09-09: qty 1

## 2022-09-09 MED ORDER — LORAZEPAM 2 MG PO TABS: 0.0000 mg | ORAL_TABLET | Freq: Three times a day (TID) | ORAL | Status: DC

## 2022-09-09 MED ORDER — PHENYLEPHRINE HCL (PRESSORS) 10 MG/ML IV SOLN
INTRAVENOUS | Status: AC
  Filled 2022-09-09: qty 1

## 2022-09-09 MED ORDER — LACTATED RINGERS IV SOLN: INTRAVENOUS | Status: DC

## 2022-09-09 MED ORDER — SODIUM CHLORIDE 0.9 % IV SOLN: INTRAVENOUS | Status: DC

## 2022-09-09 MED ORDER — ESMOLOL HCL 100 MG/10ML IV SOLN
INTRAVENOUS | Status: AC
  Filled 2022-09-09: qty 10

## 2022-09-09 MED ORDER — ROCURONIUM BROMIDE 100 MG/10ML IV SOLN
INTRAVENOUS | Status: DC | PRN
  Administered 2022-09-09: 50 mg via INTRAVENOUS
  Administered 2022-09-09: 30 mg via INTRAVENOUS

## 2022-09-09 MED ORDER — ROCURONIUM BROMIDE 10 MG/ML (PF) SYRINGE
PREFILLED_SYRINGE | INTRAVENOUS | Status: AC
  Filled 2022-09-09: qty 10

## 2022-09-09 MED ORDER — FOLIC ACID 1 MG PO TABS
1.0000 mg | ORAL_TABLET | Freq: Every day | ORAL | Status: DC
  Administered 2022-09-09 – 2022-09-10 (×2): 1 mg via ORAL
  Filled 2022-09-09 (×2): qty 1

## 2022-09-09 MED ORDER — PHENYLEPHRINE HCL (PRESSORS) 10 MG/ML IV SOLN
INTRAVENOUS | Status: DC | PRN
  Administered 2022-09-09: 160 ug via INTRAVENOUS
  Administered 2022-09-09: 240 ug via INTRAVENOUS
  Administered 2022-09-09: 160 ug via INTRAVENOUS
  Administered 2022-09-09: 240 ug via INTRAVENOUS
  Administered 2022-09-09 (×2): 160 ug via INTRAVENOUS

## 2022-09-09 MED ORDER — HEPARIN SODIUM (PORCINE) 1000 UNIT/ML IJ SOLN
INTRAMUSCULAR | Status: AC
  Filled 2022-09-09: qty 10

## 2022-09-09 MED ORDER — LORAZEPAM 2 MG PO TABS
0.0000 mg | ORAL_TABLET | ORAL | Status: DC
  Filled 2022-09-09: qty 1

## 2022-09-09 MED ORDER — CEFAZOLIN SODIUM-DEXTROSE 2-4 GM/100ML-% IV SOLN
2.0000 g | Freq: Three times a day (TID) | INTRAVENOUS | Status: AC
  Administered 2022-09-09 – 2022-09-10 (×2): 2 g via INTRAVENOUS
  Filled 2022-09-09 (×2): qty 100

## 2022-09-09 MED ORDER — POTASSIUM CHLORIDE CRYS ER 20 MEQ PO TBCR: 20.0000 meq | EXTENDED_RELEASE_TABLET | Freq: Every day | ORAL | Status: DC | PRN

## 2022-09-09 MED ORDER — METOPROLOL TARTRATE 5 MG/5ML IV SOLN: 2.0000 mg | INTRAVENOUS | Status: DC | PRN

## 2022-09-09 MED ORDER — LORAZEPAM 2 MG/ML IJ SOLN
2.0000 mg | Freq: Once | INTRAMUSCULAR | Status: AC
  Administered 2022-09-09: 2 mg via INTRAVENOUS
  Filled 2022-09-09: qty 1

## 2022-09-09 MED ORDER — ORAL CARE MOUTH RINSE: 15.0000 mL | Freq: Once | OROMUCOSAL | Status: DC

## 2022-09-09 MED ORDER — HEPARIN SODIUM (PORCINE) 1000 UNIT/ML IJ SOLN
INTRAMUSCULAR | Status: DC | PRN
  Administered 2022-09-09: 8000 [IU] via INTRAVENOUS

## 2022-09-09 MED ORDER — PHENYLEPHRINE HCL-NACL 20-0.9 MG/250ML-% IV SOLN
INTRAVENOUS | Status: DC | PRN
  Administered 2022-09-09: 25 ug/min via INTRAVENOUS

## 2022-09-09 MED ORDER — SUGAMMADEX SODIUM 200 MG/2ML IV SOLN
INTRAVENOUS | Status: DC | PRN
  Administered 2022-09-09: 150 mg via INTRAVENOUS

## 2022-09-09 MED ORDER — CHLORHEXIDINE GLUCONATE 0.12 % MT SOLN
15.0000 mL | Freq: Once | OROMUCOSAL | Status: DC
  Filled 2022-09-09: qty 15

## 2022-09-09 MED ORDER — SENNOSIDES-DOCUSATE SODIUM 8.6-50 MG PO TABS: 1.0000 | ORAL_TABLET | Freq: Every evening | ORAL | Status: DC | PRN

## 2022-09-09 MED ORDER — EPHEDRINE SULFATE (PRESSORS) 50 MG/ML IJ SOLN
INTRAMUSCULAR | Status: DC | PRN
  Administered 2022-09-09: 5 mg via INTRAVENOUS

## 2022-09-09 MED ORDER — NITROGLYCERIN IN D5W 200-5 MCG/ML-% IV SOLN: 5.0000 ug/min | INTRAVENOUS | Status: DC

## 2022-09-09 MED ORDER — ESMOLOL HCL 100 MG/10ML IV SOLN
INTRAVENOUS | Status: DC | PRN
  Administered 2022-09-09: 10 mg via INTRAVENOUS
  Administered 2022-09-09: 20 mg via INTRAVENOUS
  Administered 2022-09-09: 30 mg via INTRAVENOUS

## 2022-09-09 MED ORDER — DIPHENHYDRAMINE HCL 50 MG/ML IJ SOLN
INTRAMUSCULAR | Status: AC
  Filled 2022-09-09: qty 1

## 2022-09-09 MED ORDER — CEFAZOLIN SODIUM-DEXTROSE 2-4 GM/100ML-% IV SOLN
INTRAVENOUS | Status: AC
  Filled 2022-09-09: qty 100

## 2022-09-09 MED ORDER — HYDROMORPHONE HCL 1 MG/ML IJ SOLN: 1.0000 mg | Freq: Once | INTRAMUSCULAR | Status: DC | PRN

## 2022-09-09 MED ORDER — DOPAMINE-DEXTROSE 3.2-5 MG/ML-% IV SOLN: 3.0000 ug/kg/min | INTRAVENOUS | Status: DC

## 2022-09-09 MED ORDER — ACETAMINOPHEN 325 MG PO TABS: 325.0000 mg | ORAL_TABLET | ORAL | Status: DC | PRN

## 2022-09-09 MED ORDER — PHENOL 1.4 % MT LIQD: 1.0000 | OROMUCOSAL | Status: DC | PRN

## 2022-09-09 MED ORDER — FAMOTIDINE 20 MG PO TABS: 20.0000 mg | ORAL_TABLET | Freq: Once | ORAL | Status: DC

## 2022-09-09 MED ORDER — LOSARTAN POTASSIUM 50 MG PO TABS
25.0000 mg | ORAL_TABLET | Freq: Every day | ORAL | Status: DC
  Administered 2022-09-10: 25 mg via ORAL
  Filled 2022-09-09: qty 1

## 2022-09-09 MED ORDER — FENTANYL CITRATE (PF) 100 MCG/2ML IJ SOLN
INTRAMUSCULAR | Status: DC | PRN
  Administered 2022-09-09 (×4): 25 ug via INTRAVENOUS

## 2022-09-09 MED ORDER — FENTANYL CITRATE (PF) 100 MCG/2ML IJ SOLN: 25.0000 ug | INTRAMUSCULAR | Status: DC | PRN

## 2022-09-09 MED ORDER — SODIUM CHLORIDE 0.9 % IV SOLN: 500.0000 mL | Freq: Once | INTRAVENOUS | Status: DC | PRN

## 2022-09-09 MED ORDER — ACETAMINOPHEN 325 MG RE SUPP: 325.0000 mg | RECTAL | Status: DC | PRN

## 2022-09-09 MED ORDER — ALUM & MAG HYDROXIDE-SIMETH 200-200-20 MG/5ML PO SUSP: 15.0000 mL | ORAL | Status: DC | PRN

## 2022-09-09 MED ORDER — LABETALOL HCL 5 MG/ML IV SOLN
10.0000 mg | INTRAVENOUS | Status: DC | PRN
  Administered 2022-09-09 – 2022-09-10 (×3): 10 mg via INTRAVENOUS
  Filled 2022-09-09 (×3): qty 4

## 2022-09-09 MED ORDER — FAMOTIDINE IN NACL 20-0.9 MG/50ML-% IV SOLN
20.0000 mg | Freq: Two times a day (BID) | INTRAVENOUS | Status: DC
  Administered 2022-09-09 – 2022-09-10 (×2): 20 mg via INTRAVENOUS
  Filled 2022-09-09 (×2): qty 50

## 2022-09-09 MED ORDER — KETAMINE HCL 50 MG/5ML IJ SOSY
PREFILLED_SYRINGE | INTRAMUSCULAR | Status: AC
  Filled 2022-09-09: qty 5

## 2022-09-09 MED ORDER — LORAZEPAM 2 MG PO TABS: 2.0000 mg | ORAL_TABLET | ORAL | Status: DC | PRN

## 2022-09-09 MED ORDER — CEFAZOLIN SODIUM-DEXTROSE 2-4 GM/100ML-% IV SOLN: 2.0000 g | INTRAVENOUS | Status: DC

## 2022-09-09 MED ORDER — GUAIFENESIN-DM 100-10 MG/5ML PO SYRP: 15.0000 mL | ORAL_SOLUTION | ORAL | Status: DC | PRN

## 2022-09-09 MED ORDER — LORAZEPAM 1 MG PO TABS
1.0000 mg | ORAL_TABLET | ORAL | Status: DC | PRN
  Administered 2022-09-09 (×2): 2 mg via ORAL
  Administered 2022-09-10: 1 mg via ORAL
  Filled 2022-09-09: qty 1
  Filled 2022-09-09: qty 2

## 2022-09-09 MED ORDER — PROPOFOL 10 MG/ML IV BOLUS
INTRAVENOUS | Status: DC | PRN
  Administered 2022-09-09: 100 mg via INTRAVENOUS
  Administered 2022-09-09: 40 mg via INTRAVENOUS

## 2022-09-09 MED ORDER — THIAMINE HCL 100 MG/ML IJ SOLN
100.0000 mg | Freq: Every day | INTRAMUSCULAR | Status: DC
  Filled 2022-09-09: qty 2

## 2022-09-09 SURGICAL SUPPLY — 26 items
CATH ACCU-VU SIZ PIG 5F 70CM (CATHETERS) ×1
CATH BALLN CODA 9X100X32 (BALLOONS) ×1
CATH BEACON 5 .035 65 KMP TIP (CATHETERS) ×1
CLOSURE PERCLOSE PROSTYLE (VASCULAR PRODUCTS) ×7
COVER DRAPE FLUORO 36X44 (DRAPES) ×2
COVER PROBE U/S 5X48 (MISCELLANEOUS) ×1
DEVICE SAFEGUARD 24CM (GAUZE/BANDAGES/DRESSINGS) ×1
DEVICE TORQUE .025-.038 (MISCELLANEOUS) ×1
DRYSEAL FLEXSHEATH 12FR 33CM (SHEATH) ×1
DRYSEAL FLEXSHEATH 16FR 33CM (SHEATH) ×1
EXCLUDER TRUNK LEG 26MX14X14 (Endovascular Graft) ×1 IMPLANT
GLIDEWIRE STIFF .35X180X3 HYDR (WIRE) ×1
LEG CONTRALATERAL 16X14.5X12 (Vascular Products) ×1 IMPLANT
NEEDLE ENTRY 21GA 7CM ECHOTIP (NEEDLE) ×1
PACK ANGIOGRAPHY (CUSTOM PROCEDURE TRAY) ×1
SET INTRO CAPELLA COAXIAL (SET/KITS/TRAYS/PACK) ×1
SHEATH BRITE TIP 6FRX11 (SHEATH) ×2
SHEATH BRITE TIP 8FRX11 (SHEATH) ×2
SHEATH DRYSEAL FLEX 12FR 33CM (SHEATH) ×1
SHEATH DRYSEAL FLEX 16FR 33CM (SHEATH) ×1
SPONGE XRAY 4X4 16PLY STRL (MISCELLANEOUS) ×3
SYR MEDRAD MARK 7 150ML (SYRINGE) ×1
TUBING CONTRAST HIGH PRESS 72 (TUBING) ×1
WIRE AMPLATZ SSTIFF .035X260CM (WIRE) ×2
WIRE GUIDERIGHT .035X150 (WIRE) ×2
WIRE NITINOL .018 (WIRE) ×1

## 2022-09-09 NOTE — Transfer of Care (Signed)
Immediate Anesthesia Transfer of Care Note  Patient: Ryan Rose  Procedure(s) Performed: ENDOVASCULAR REPAIR/STENT GRAFT  Patient Location: PACU  Anesthesia Type:General  Level of Consciousness: drowsy  Airway & Oxygen Therapy: Patient Spontanous Breathing and Patient connected to face mask oxygen  Post-op Assessment: Report given to RN and Post -op Vital signs reviewed and stable  Post vital signs: Reviewed and stable  Last Vitals:  Vitals Value Taken Time  BP    Temp    Pulse 91 09/09/22 1345  Resp 19 09/09/22 1345  SpO2 99 % 09/09/22 1345  Vitals shown include unvalidated device data.  Last Pain:  Vitals:   09/09/22 1030  TempSrc: Oral  PainSc: 0-No pain         Complications: No notable events documented.

## 2022-09-09 NOTE — H&P (View-Only) (Signed)
MRN : [id number]  Ryan Rose is a 62 y.o. (1961-02-11) male who presents with chief complaint of AAA.  History of Present Illness:   The patient presents to the office for evaluation of an abdominal aortic aneurysm. The aneurysm was found incidentally by CT scan done at [facility] on 05/28/2022 for left lower quadrant pain. Patient denies persistent abdominal pain or unusual back pain, no other abdominal complaints.  No history of an abrupt onset of a painful toe associated with blue discoloration.      No family history of AAA.    Patient denies amaurosis fugax or TIA symptoms. There is no history of claudication or rest pain symptoms of the lower extremities.  The patient denies angina or shortness of breath.   CT scan,dated 05/28/2022, is reviewed by me and shows an AAA that measures 4.5 cm but it is a sacular configuration with thrombus moderate ASO at the iliac bifurcation.   ABI's Rt=1.23 and Lt=1.22 Duplex ultrasound of the lower extremity arterial system is negative for popliteal artery aneurysms    Current Meds  Medication Sig   acetaminophen (TYLENOL) 500 MG tablet Take one or two tablet by mouth every 6  hours as needed for pain   aspirin 81 MG chewable tablet Chew 81 mg by mouth daily as needed.   atorvastatin (LIPITOR) 40 MG tablet Take 1 tablet (40 mg total) by mouth daily.   losartan (COZAAR) 25 MG tablet Take 1 tablet (25 mg total) by mouth daily.   senna-docusate (SENOKOT-S) 8.6-50 MG tablet Take 2 tablets by mouth at bedtime. For constipation.  Also available over-the-counter   traMADol (ULTRAM) 50 MG tablet Take 1 tablet (50 mg total) by mouth every 6 (six) hours as needed for up to 20 doses for moderate pain or severe pain.    Past Medical History:  Diagnosis Date   Anemia    Aortic atherosclerosis (HCC)    Arthritis    Bilateral carpal tunnel syndrome    Coronary artery disease    Diastolic dysfunction    a.) TTE 08/24/2022: EF 60-65%,  mild-mod MR, G1DD.   ETOH abuse    Hepatic steatosis    History of methicillin resistant staphylococcus aureus (MRSA)    HLD (hyperlipidemia)    HTN (hypertension)    Infrarenal abdominal aortic aneurysm (AAA) without rupture (HCC) 05/28/2022   a.) CT AP 05/28/2022: saccular thrombosed; measures 4.4 x 3.2 cm   PVD (peripheral vascular disease) with claudication (HCC)    Sigmoid diverticulosis    Transaminitis     Past Surgical History:  Procedure Laterality Date   CERVICAL LAMINOPLASTY  08/20/2021   right C3-C6   COLONOSCOPY     SKIN GRAFT     to hand and forearm    Social History Social History   Tobacco Use   Smoking status: Some Days    Packs/day: 0.10    Years: 45.00    Total pack years: 4.50    Types: Cigarettes   Smokeless tobacco: Never   Tobacco comments:    Smokes 5 cigarettes weekly  Vaping Use   Vaping Use: Never used  Substance Use Topics   Alcohol use: Yes    Comment: occasional   Drug use: Yes    Types: Marijuana    Comment: occasionally    Family History Family History  Problem Relation Age of Onset   Heart attack Mother  Allergies  Allergen Reactions   Sulfa Antibiotics Hives     REVIEW OF SYSTEMS (Negative unless checked)  Constitutional: [] Weight loss  [] Fever  [] Chills Cardiac: [] Chest pain   [] Chest pressure   [] Palpitations   [] Shortness of breath when laying flat   [] Shortness of breath with exertion. Vascular:  [x] Pain in legs with walking   [] Pain in legs at rest  [] History of DVT   [] Phlebitis   [] Swelling in legs   [] Varicose veins   [] Non-healing ulcers Pulmonary:   [] Uses home oxygen   [] Productive cough   [] Hemoptysis   [] Wheeze  [] COPD   [] Asthma Neurologic:  [] Dizziness   [] Seizures   [] History of stroke   [] History of TIA  [] Aphasia   [] Vissual changes   [] Weakness or numbness in arm   [] Weakness or numbness in leg Musculoskeletal:   [] Joint swelling   [] Joint pain   [] Low back pain Hematologic:  [] Easy bruising   [] Easy bleeding   [] Hypercoagulable state   [] Anemic Gastrointestinal:  [] Diarrhea   [] Vomiting  [] Gastroesophageal reflux/heartburn   [] Difficulty swallowing. Genitourinary:  [] Chronic kidney disease   [] Difficult urination  [] Frequent urination   [] Blood in urine Skin:  [] Rashes   [] Ulcers  Psychological:  [] History of anxiety   []  History of major depression.  Physical Examination  Vitals:   09/09/22 1030  BP: (!) 133/97  Pulse: 90  Resp: 18  Temp: 98 F (36.7 C)  TempSrc: Oral  SpO2: 95%  Weight: 74.8 kg  Height: 5\' 8"  (1.727 m)   Body mass index is 25.09 kg/m. Gen: WD/WN, NAD Head: [place]/AT, No temporalis wasting.  Ear/Nose/Throat: Hearing grossly intact, nares w/o erythema or drainage Eyes: PER, EOMI, sclera nonicteric.  Neck: Supple, no masses.  No bruit or JVD.  Pulmonary:  Good air movement, no audible wheezing, no use of accessory muscles.  Cardiac: RRR, normal S1, S2, no Murmurs. Vascular:  mild trophic changes, no open wounds Vessel Right Left  Radial Palpable Palpable  PT Not Palpable Not Palpable  DP Not Palpable Not Palpable  Gastrointestinal: soft, non-distended. No guarding/no peritoneal signs.  Musculoskeletal: M/S 5/5 throughout.  No visible deformity.  Neurologic: CN 2-12 intact. Pain and light touch intact in extremities.  Symmetrical.  Speech is fluent. Motor exam as listed above. Psychiatric: Judgment intact, Mood & affect appropriate for pt's clinical situation. Dermatologic: No rashes or ulcers noted.  No changes consistent with cellulitis.   CBC Lab Results  Component Value Date   WBC 6.8 09/07/2022   HGB 13.1 09/07/2022   HCT 38.8 (L) 09/07/2022   MCV 100.8 (H) 09/07/2022   PLT 252 09/07/2022    BMET    Component Value Date/Time   NA 143 09/07/2022 0836   NA 140 11/25/2014 1016   K 3.9 09/07/2022 0836   K 3.6 11/25/2014 1016   CL 106 09/07/2022 0836   CL 107 11/25/2014 1016   CO2 25 09/07/2022 0836   CO2 16 (L) 11/25/2014 1016    GLUCOSE 98 09/07/2022 0836   GLUCOSE 91 11/25/2014 1016   BUN 8 09/07/2022 0836   BUN 13 11/25/2014 1016   CREATININE 0.59 (L) 09/07/2022 0836   CREATININE 1.05 11/25/2014 1016   CALCIUM 8.6 (L) 09/07/2022 0836   CALCIUM 8.4 (L) 11/25/2014 1016   GFRNONAA >60 09/07/2022 0836   GFRNONAA >60 11/25/2014 1016   GFRAA >60 02/18/2019 0259   GFRAA >60 11/25/2014 1016   Estimated Creatinine Clearance: 92.6 mL/min (A) (by C-G formula based on SCr  of 0.59 mg/dL (L)).  COAG Lab Results  Component Value Date   INR 1.0 05/28/2022   INR 1.0 08/06/2021    Radiology ECHOCARDIOGRAM COMPLETE  Result Date: 08/24/2022    ECHOCARDIOGRAM REPORT   Patient Name:   Ryan Rose Date of Exam: 08/24/2022 Medical Rec #:  [id number]        Height:       68.5 in Accession #:    [id number]       Weight:       160.4 lb Date of Birth:  May 07, 1961       BSA:          1.871 m Patient Age:    61 years         BP:           140/82 mmHg Patient Gender: M                HR:           80 bpm. Exam Location:  [place] Procedure: 2D Echo, Cardiac Doppler and Color Doppler Indications:    Z01.818 Encounter for other preprocedural examination  History:        Patient has no prior history of Echocardiogram examinations.                 AAA; Risk Factors:Dyslipidemia, Current Smoker and Hypertension.  Sonographer:    Dondra Prader RVT RCS Referring Phys: [id number] [facility]  Sonographer Comments: Technically challenging study due to limited acoustic windows. IMPRESSIONS  1. Left ventricular ejection fraction, by estimation, is 60 to 65%. The left ventricle has normal function. The left ventricle has no regional wall motion abnormalities. There is mild left ventricular hypertrophy. Left ventricular diastolic parameters are consistent with Grade I diastolic dysfunction (impaired relaxation).  2. Right ventricular systolic function is normal. The right ventricular size is normal.  3. The mitral valve is normal in structure. Mild  to moderate mitral valve regurgitation.  4. The aortic valve is tricuspid. Aortic valve regurgitation is not visualized.  5. The inferior vena cava is normal in size with greater than 50% respiratory variability, suggesting right atrial pressure of 3 mmHg. FINDINGS  Left Ventricle: Left ventricular ejection fraction, by estimation, is 60 to 65%. The left ventricle has normal function. The left ventricle has no regional wall motion abnormalities. The left ventricular internal cavity size was normal in size. There is  mild left ventricular hypertrophy. Left ventricular diastolic parameters are consistent with Grade I diastolic dysfunction (impaired relaxation). Right Ventricle: The right ventricular size is normal. No increase in right ventricular wall thickness. Right ventricular systolic function is normal. Left Atrium: Left atrial size was normal in size. Right Atrium: Right atrial size was normal in size. Pericardium: There is no evidence of pericardial effusion. Mitral Valve: The mitral valve is normal in structure. Mild to moderate mitral valve regurgitation. Tricuspid Valve: The tricuspid valve is normal in structure. Tricuspid valve regurgitation is not demonstrated. Aortic Valve: The aortic valve is tricuspid. Aortic valve regurgitation is not visualized. Aortic valve mean gradient measures 4.0 mmHg. Aortic valve peak gradient measures 6.2 mmHg. Aortic valve area, by VTI measures 3.05 cm. Pulmonic Valve: The pulmonic valve was normal in structure. Pulmonic valve regurgitation is not visualized. Aorta: The aortic root and ascending aorta are structurally normal, with no evidence of dilitation. Venous: The inferior vena cava is normal in size with greater than 50% respiratory variability, suggesting right atrial pressure of 3 mmHg. IAS/Shunts: No atrial  level shunt detected by color flow Doppler.  LEFT VENTRICLE PLAX 2D LVIDd:         4.30 cm   Diastology LVIDs:         3.20 cm   LV e' medial:    5.57 cm/s LV  PW:         1.10 cm   LV E/e' medial:  14.8 LV IVS:        1.50 cm   LV e' lateral:   7.70 cm/s LVOT diam:     2.10 cm   LV E/e' lateral: 10.7 LV SV:         72 LV SV Index:   39 LVOT Area:     3.46 cm  RIGHT VENTRICLE             IVC RV Basal diam:  3.20 cm     IVC diam: 1.50 cm RV Mid diam:    1.90 cm RV S prime:     13.90 cm/s TAPSE (M-mode): 1.3 cm LEFT ATRIUM             Index        RIGHT ATRIUM           Index LA diam:        3.20 cm 1.71 cm/m   RA Area:     12.40 cm LA Vol (A2C):   38.1 ml 20.36 ml/m  RA Volume:   25.60 ml  13.68 ml/m LA Vol (A4C):   29.6 ml 15.82 ml/m LA Biplane Vol: 34.7 ml 18.55 ml/m  AORTIC VALVE                    PULMONIC VALVE AV Area (Vmax):    3.04 cm     PV Vmax:       0.75 m/s AV Area (Vmean):   2.91 cm     PV Peak grad:  2.2 mmHg AV Area (VTI):     3.05 cm AV Vmax:           124.00 cm/s AV Vmean:          91.100 cm/s AV VTI:            0.236 m AV Peak Grad:      6.2 mmHg AV Mean Grad:      4.0 mmHg LVOT Vmax:         109.00 cm/s LVOT Vmean:        76.600 cm/s LVOT VTI:          0.208 m LVOT/AV VTI ratio: 0.88  AORTA Ao Root diam: 3.70 cm Ao Asc diam:  3.30 cm Ao Arch diam: 2.8 cm MITRAL VALVE               TRICUSPID VALVE MV Area (PHT): 2.07 cm    TR Peak grad:   29.2 mmHg MV Decel Time: 367 msec    TR Vmax:        270.00 cm/s MV E velocity: 82.50 cm/s MV A velocity: 92.50 cm/s  SHUNTS MV E/A ratio:  0.89        Systemic VTI:  0.21 m                            Systemic Diam: 2.10 cm Kate Sable MD Electronically signed by Kate Sable MD Signature Date/Time: 08/24/2022/5:26:28 PM    Final    NM Myocar Multi W/Spect Tamela Oddi Motion /  EF  Result Date: 08/19/2022   Findings are consistent with no ischemia. The study is low risk.   No ST deviation was noted.   Left ventricular function is normal. End diastolic cavity size is normal. Calculated LVEF = 75%   Three vessel coronary artery calcification noted.     Assessment/Plan 1.  Infrarenal abdominal aortic  aneurysm (AAA) without rupture (HCC) Recommend:  The aneurysm is > 5 cm and therefore should undergo repair. Patient is status post CT scan of the abdominal aorta. The patient is a candidate for endovascular repair.    The patient will require cardiac clearance prior to stent graft placement.    The patient will continue antiplatelet therapy as prescribed (since the patient is undergoing endovascular repair as opposed to open repair) as well as aggressive management of hyperlipidemia. Exercise is again strongly encouraged.    The patient is reminded that lifetime routine surveillance is a necessity with an endograft.    The risks and benefits of AAA repair are reviewed with the patient.  All questions are answered.  Alternative therapies are also discussed.  The patient agrees to proceed with endovascular aneurysm repair.   Patient will follow-up with me in the office after the surgery.   2. Atherosclerosis of native artery of both lower extremities with intermittent claudication (HCC)  Recommend:   The patient has evidence of atherosclerosis of the lower extremities with claudication.  The patient does not voice lifestyle limiting changes at this point in time.   Noninvasive studies do not suggest clinically significant change.   No invasive studies, angiography or surgery at this time The patient should continue walking and begin a more formal exercise program.  The patient should continue antiplatelet therapy and aggressive treatment of the lipid abnormalities   No changes in the patient's medications at this time   Continued surveillance is indicated as atherosclerosis is likely to progress with time.     The patient will continue follow up with noninvasive studies as ordered.   3. Mixed hyperlipidemia Continue statin as ordered and reviewed, no changes at this time     Hortencia Pilar, MD  09/09/2022 10:35 AM

## 2022-09-09 NOTE — Op Note (Signed)
OPERATIVE NOTE   PROCEDURE: US guidance for vascular access, bilateral femoral arteries Catheter placement into aorta from bilateral femoral approaches Placement of a C3 26 x 14 x 14 Gore Excluder Endoprosthesis main body  with a 14 x 12 iliac extender contralateral limb ProGlide closure devices bilateral femoral arteries  PRE-OPERATIVE DIAGNOSIS: AAA  POST-OPERATIVE DIAGNOSIS: same  SURGEON: Hortencia Pilar, MD and Leotis Pain, MD - Co-surgeons  ANESTHESIA: general  ESTIMATED BLOOD LOSS: 50 cc  FINDING(S): 1.  AAA  SPECIMEN(S):  none  INDICATIONS:   NATE COMMON is a 62 y.o. y.o. male who presents with increasing abdominal pain and a rapidly expanding aneurysm.  He has a saccular morphology that appears to have penetration left lateral.  Given these findings we elected to repair his aneurysm at this time rather than continue observation even though the overall dimension was not quite greater than 5 cm.  Endo vascular stent graft repair is recommended.  Risks and benefits have been reviewed all questions been answered patient agrees to proceed.  DESCRIPTION: After obtaining full informed written consent, the patient was brought back to the operating room and placed supine upon the operating table.  The patient received IV antibiotics prior to induction.  After obtaining adequate anesthesia, the patient was prepped and draped in the standard fashion for endovascular AAA repair.  Co-surgeons are required because this is a complex bilateral procedure with work being performed simultaneously from both the right femoral and left femoral approach.  This also expedites the procedure making a shorter operative time reducing complications and improving patient safety.  We then began by gaining access to both femoral arteries with US guidance with me working on the patient's right and Dr. Lucky Cowboy working on the patient's left.  The femoral arteries were found to be patent and accessed without  difficulty with a needle under ultrasound guidance without difficulty on each side and permanent images were recorded.  We then placed 2 proglide devices on each side in a pre-close fashion and placed 8 French sheaths.  The patient was then given 8000 units of intravenous heparin.   The Pigtail catheter was placed into the aorta from the left side. Using this image, we selected a C3 26 x 14 x 14 Main body device.  Over a stiff wire, an 16 French sheath was placed. The main body was then placed through the 16 French sheath. A pigtail catheter was placed up the left side and a magnified image at the renal arteries was performed. The main body was then deployed just below the lowest renal artery. The pigtail catheter was then exchanged for a Kumpe catheter which was used to cannulate the contralateral gate without difficulty and successful cannulation was confirmed by twirling the pigtail catheter in the main body. We then placed a stiff wire and a retrograde arteriogram was performed through the left femoral sheath. We upsized to the 12 [place] sheath for the contralateral limb and a 14 x 12 limb was selected and deployed. The main body deployment was then completed. Based off the angiographic findings, extension limbs were not necessary.  All junction points and seals zones were treated with the compliant balloon.   The pigtail catheter was then replaced and a completion angiogram was performed.   No endoleak was detected on completion angiography. The renal arteries were found to be widely patent.    At this point we elected to terminate the procedure. We secured the pro glide devices for hemostasis on the  femoral arteries. The skin incision was closed with a 4-0 Monocryl. Dermabond and pressure dressing were placed. The patient was taken to the recovery room in stable condition having tolerated the procedure well.  COMPLICATIONS: none  CONDITION: stable  Hortencia Pilar  09/09/2022, 12:59 PM

## 2022-09-09 NOTE — Progress Notes (Signed)
Patient arrived to pacu awake/alert x4. No c/o's

## 2022-09-09 NOTE — Progress Notes (Signed)
Patient reports drinking 1 bottle vodka / 2 days. On examination, patient has tremors with anxiety, CIWA score 11. Patient stated he had last drink from 2 days ago. Vascular NP made aware, started on CIWA protocol.

## 2022-09-09 NOTE — Interval H&P Note (Signed)
History and Physical Interval Note:  09/09/2022 10:38 AM  Ryan Rose  has presented today for surgery, with the diagnosis of AAA   Anesthesia   GORE   AAA Repair Dr Delana Meyer w [facility] to assist.  The various methods of treatment have been discussed with the patient and family. After consideration of risks, benefits and other options for treatment, the patient has consented to  Procedure(s): ENDOVASCULAR REPAIR/STENT GRAFT (N/A) as a surgical intervention.  The patient's history has been reviewed, patient examined, no change in status, stable for surgery.  I have reviewed the patient's chart and labs.  Questions were answered to the patient's satisfaction.     Hortencia Pilar

## 2022-09-09 NOTE — Anesthesia Procedure Notes (Signed)
Procedure Name: Intubation Date/Time: 09/09/2022 11:52 AM  Performed by: Lia Foyer, CRNAPre-anesthesia Checklist: Patient identified, Emergency Drugs available, Suction available and Patient being monitored Patient Re-evaluated:Patient Re-evaluated prior to induction Oxygen Delivery Method: Circle system utilized Preoxygenation: Pre-oxygenation with 100% oxygen Induction Type: IV induction Ventilation: Mask ventilation without difficulty Laryngoscope Size: McGraph and 4 Grade View: Grade I Tube type: Oral Tube size: 7.0 mm Number of attempts: 1 Airway Equipment and Method: Stylet and Video-laryngoscopy Placement Confirmation: ETT inserted through vocal cords under direct vision, positive ETCO2 and breath sounds checked- equal and bilateral Secured at: 21 cm Tube secured with: Tape Dental Injury: Teeth and Oropharynx as per pre-operative assessment

## 2022-09-09 NOTE — Progress Notes (Signed)
MRN : [id number]  Ryan Rose is a 62 y.o. (1961-02-11) male who presents with chief complaint of AAA.  History of Present Illness:   The patient presents to the office for evaluation of an abdominal aortic aneurysm. The aneurysm was found incidentally by CT scan done at [facility] on 05/28/2022 for left lower quadrant pain. Patient denies persistent abdominal pain or unusual back pain, no other abdominal complaints.  No history of an abrupt onset of a painful toe associated with blue discoloration.      No family history of AAA.    Patient denies amaurosis fugax or TIA symptoms. There is no history of claudication or rest pain symptoms of the lower extremities.  The patient denies angina or shortness of breath.   CT scan,dated 05/28/2022, is reviewed by me and shows an AAA that measures 4.5 cm but it is a sacular configuration with thrombus moderate ASO at the iliac bifurcation.   ABI's Rt=1.23 and Lt=1.22 Duplex ultrasound of the lower extremity arterial system is negative for popliteal artery aneurysms    Current Meds  Medication Sig   acetaminophen (TYLENOL) 500 MG tablet Take one or two tablet by mouth every 6  hours as needed for pain   aspirin 81 MG chewable tablet Chew 81 mg by mouth daily as needed.   atorvastatin (LIPITOR) 40 MG tablet Take 1 tablet (40 mg total) by mouth daily.   losartan (COZAAR) 25 MG tablet Take 1 tablet (25 mg total) by mouth daily.   senna-docusate (SENOKOT-S) 8.6-50 MG tablet Take 2 tablets by mouth at bedtime. For constipation.  Also available over-the-counter   traMADol (ULTRAM) 50 MG tablet Take 1 tablet (50 mg total) by mouth every 6 (six) hours as needed for up to 20 doses for moderate pain or severe pain.    Past Medical History:  Diagnosis Date   Anemia    Aortic atherosclerosis (HCC)    Arthritis    Bilateral carpal tunnel syndrome    Coronary artery disease    Diastolic dysfunction    a.) TTE 08/24/2022: EF 60-65%,  mild-mod MR, G1DD.   ETOH abuse    Hepatic steatosis    History of methicillin resistant staphylococcus aureus (MRSA)    HLD (hyperlipidemia)    HTN (hypertension)    Infrarenal abdominal aortic aneurysm (AAA) without rupture (HCC) 05/28/2022   a.) CT AP 05/28/2022: saccular thrombosed; measures 4.4 x 3.2 cm   PVD (peripheral vascular disease) with claudication (HCC)    Sigmoid diverticulosis    Transaminitis     Past Surgical History:  Procedure Laterality Date   CERVICAL LAMINOPLASTY  08/20/2021   right C3-C6   COLONOSCOPY     SKIN GRAFT     to hand and forearm    Social History Social History   Tobacco Use   Smoking status: Some Days    Packs/day: 0.10    Years: 45.00    Total pack years: 4.50    Types: Cigarettes   Smokeless tobacco: Never   Tobacco comments:    Smokes 5 cigarettes weekly  Vaping Use   Vaping Use: Never used  Substance Use Topics   Alcohol use: Yes    Comment: occasional   Drug use: Yes    Types: Marijuana    Comment: occasionally    Family History Family History  Problem Relation Age of Onset   Heart attack Mother  Allergies  Allergen Reactions   Sulfa Antibiotics Hives     REVIEW OF SYSTEMS (Negative unless checked)  Constitutional: [] Weight loss  [] Fever  [] Chills Cardiac: [] Chest pain   [] Chest pressure   [] Palpitations   [] Shortness of breath when laying flat   [] Shortness of breath with exertion. Vascular:  [x] Pain in legs with walking   [] Pain in legs at rest  [] History of DVT   [] Phlebitis   [] Swelling in legs   [] Varicose veins   [] Non-healing ulcers Pulmonary:   [] Uses home oxygen   [] Productive cough   [] Hemoptysis   [] Wheeze  [] COPD   [] Asthma Neurologic:  [] Dizziness   [] Seizures   [] History of stroke   [] History of TIA  [] Aphasia   [] Vissual changes   [] Weakness or numbness in arm   [] Weakness or numbness in leg Musculoskeletal:   [] Joint swelling   [] Joint pain   [] Low back pain Hematologic:  [] Easy bruising   [] Easy bleeding   [] Hypercoagulable state   [] Anemic Gastrointestinal:  [] Diarrhea   [] Vomiting  [] Gastroesophageal reflux/heartburn   [] Difficulty swallowing. Genitourinary:  [] Chronic kidney disease   [] Difficult urination  [] Frequent urination   [] Blood in urine Skin:  [] Rashes   [] Ulcers  Psychological:  [] History of anxiety   []  History of major depression.  Physical Examination  Vitals:   09/09/22 1030  BP: (!) 133/97  Pulse: 90  Resp: 18  Temp: 98 F (36.7 C)  TempSrc: Oral  SpO2: 95%  Weight: 74.8 kg  Height: 5\' 8"  (1.727 m)   Body mass index is 25.09 kg/m. Gen: WD/WN, NAD Head: [place]/AT, No temporalis wasting.  Ear/Nose/Throat: Hearing grossly intact, nares w/o erythema or drainage Eyes: PER, EOMI, sclera nonicteric.  Neck: Supple, no masses.  No bruit or JVD.  Pulmonary:  Good air movement, no audible wheezing, no use of accessory muscles.  Cardiac: RRR, normal S1, S2, no Murmurs. Vascular:  mild trophic changes, no open wounds Vessel Right Left  Radial Palpable Palpable  PT Not Palpable Not Palpable  DP Not Palpable Not Palpable  Gastrointestinal: soft, non-distended. No guarding/no peritoneal signs.  Musculoskeletal: M/S 5/5 throughout.  No visible deformity.  Neurologic: CN 2-12 intact. Pain and light touch intact in extremities.  Symmetrical.  Speech is fluent. Motor exam as listed above. Psychiatric: Judgment intact, Mood & affect appropriate for pt's clinical situation. Dermatologic: No rashes or ulcers noted.  No changes consistent with cellulitis.   CBC Lab Results  Component Value Date   WBC 6.8 09/07/2022   HGB 13.1 09/07/2022   HCT 38.8 (L) 09/07/2022   MCV 100.8 (H) 09/07/2022   PLT 252 09/07/2022    BMET    Component Value Date/Time   NA 143 09/07/2022 0836   NA 140 11/25/2014 1016   K 3.9 09/07/2022 0836   K 3.6 11/25/2014 1016   CL 106 09/07/2022 0836   CL 107 11/25/2014 1016   CO2 25 09/07/2022 0836   CO2 16 (L) 11/25/2014 1016    GLUCOSE 98 09/07/2022 0836   GLUCOSE 91 11/25/2014 1016   BUN 8 09/07/2022 0836   BUN 13 11/25/2014 1016   CREATININE 0.59 (L) 09/07/2022 0836   CREATININE 1.05 11/25/2014 1016   CALCIUM 8.6 (L) 09/07/2022 0836   CALCIUM 8.4 (L) 11/25/2014 1016   GFRNONAA >60 09/07/2022 0836   GFRNONAA >60 11/25/2014 1016   GFRAA >60 02/18/2019 0259   GFRAA >60 11/25/2014 1016   Estimated Creatinine Clearance: 92.6 mL/min (A) (by C-G formula based on SCr  of 0.59 mg/dL (L)).  COAG Lab Results  Component Value Date   INR 1.0 05/28/2022   INR 1.0 08/06/2021    Radiology ECHOCARDIOGRAM COMPLETE  Result Date: 08/24/2022    ECHOCARDIOGRAM REPORT   Patient Name:   LORAINE FREID Date of Exam: 08/24/2022 Medical Rec #:  [id number]        Height:       68.5 in Accession #:    [id number]       Weight:       160.4 lb Date of Birth:  Jun 19, 1961       BSA:          1.871 m Patient Age:    61 years         BP:           140/82 mmHg Patient Gender: M                HR:           80 bpm. Exam Location:  [place] Procedure: 2D Echo, Cardiac Doppler and Color Doppler Indications:    Z01.818 Encounter for other preprocedural examination  History:        Patient has no prior history of Echocardiogram examinations.                 AAA; Risk Factors:Dyslipidemia, Current Smoker and Hypertension.  Sonographer:    Dondra Prader RVT RCS Referring Phys: [id number] [facility]  Sonographer Comments: Technically challenging study due to limited acoustic windows. IMPRESSIONS  1. Left ventricular ejection fraction, by estimation, is 60 to 65%. The left ventricle has normal function. The left ventricle has no regional wall motion abnormalities. There is mild left ventricular hypertrophy. Left ventricular diastolic parameters are consistent with Grade I diastolic dysfunction (impaired relaxation).  2. Right ventricular systolic function is normal. The right ventricular size is normal.  3. The mitral valve is normal in structure. Mild  to moderate mitral valve regurgitation.  4. The aortic valve is tricuspid. Aortic valve regurgitation is not visualized.  5. The inferior vena cava is normal in size with greater than 50% respiratory variability, suggesting right atrial pressure of 3 mmHg. FINDINGS  Left Ventricle: Left ventricular ejection fraction, by estimation, is 60 to 65%. The left ventricle has normal function. The left ventricle has no regional wall motion abnormalities. The left ventricular internal cavity size was normal in size. There is  mild left ventricular hypertrophy. Left ventricular diastolic parameters are consistent with Grade I diastolic dysfunction (impaired relaxation). Right Ventricle: The right ventricular size is normal. No increase in right ventricular wall thickness. Right ventricular systolic function is normal. Left Atrium: Left atrial size was normal in size. Right Atrium: Right atrial size was normal in size. Pericardium: There is no evidence of pericardial effusion. Mitral Valve: The mitral valve is normal in structure. Mild to moderate mitral valve regurgitation. Tricuspid Valve: The tricuspid valve is normal in structure. Tricuspid valve regurgitation is not demonstrated. Aortic Valve: The aortic valve is tricuspid. Aortic valve regurgitation is not visualized. Aortic valve mean gradient measures 4.0 mmHg. Aortic valve peak gradient measures 6.2 mmHg. Aortic valve area, by VTI measures 3.05 cm. Pulmonic Valve: The pulmonic valve was normal in structure. Pulmonic valve regurgitation is not visualized. Aorta: The aortic root and ascending aorta are structurally normal, with no evidence of dilitation. Venous: The inferior vena cava is normal in size with greater than 50% respiratory variability, suggesting right atrial pressure of 3 mmHg. IAS/Shunts: No atrial  level shunt detected by color flow Doppler.  LEFT VENTRICLE PLAX 2D LVIDd:         4.30 cm   Diastology LVIDs:         3.20 cm   LV e' medial:    5.57 cm/s LV  PW:         1.10 cm   LV E/e' medial:  14.8 LV IVS:        1.50 cm   LV e' lateral:   7.70 cm/s LVOT diam:     2.10 cm   LV E/e' lateral: 10.7 LV SV:         72 LV SV Index:   39 LVOT Area:     3.46 cm  RIGHT VENTRICLE             IVC RV Basal diam:  3.20 cm     IVC diam: 1.50 cm RV Mid diam:    1.90 cm RV S prime:     13.90 cm/s TAPSE (M-mode): 1.3 cm LEFT ATRIUM             Index        RIGHT ATRIUM           Index LA diam:        3.20 cm 1.71 cm/m   RA Area:     12.40 cm LA Vol (A2C):   38.1 ml 20.36 ml/m  RA Volume:   25.60 ml  13.68 ml/m LA Vol (A4C):   29.6 ml 15.82 ml/m LA Biplane Vol: 34.7 ml 18.55 ml/m  AORTIC VALVE                    PULMONIC VALVE AV Area (Vmax):    3.04 cm     PV Vmax:       0.75 m/s AV Area (Vmean):   2.91 cm     PV Peak grad:  2.2 mmHg AV Area (VTI):     3.05 cm AV Vmax:           124.00 cm/s AV Vmean:          91.100 cm/s AV VTI:            0.236 m AV Peak Grad:      6.2 mmHg AV Mean Grad:      4.0 mmHg LVOT Vmax:         109.00 cm/s LVOT Vmean:        76.600 cm/s LVOT VTI:          0.208 m LVOT/AV VTI ratio: 0.88  AORTA Ao Root diam: 3.70 cm Ao Asc diam:  3.30 cm Ao Arch diam: 2.8 cm MITRAL VALVE               TRICUSPID VALVE MV Area (PHT): 2.07 cm    TR Peak grad:   29.2 mmHg MV Decel Time: 367 msec    TR Vmax:        270.00 cm/s MV E velocity: 82.50 cm/s MV A velocity: 92.50 cm/s  SHUNTS MV E/A ratio:  0.89        Systemic VTI:  0.21 m                            Systemic Diam: 2.10 cm Kate Sable MD Electronically signed by Kate Sable MD Signature Date/Time: 08/24/2022/5:26:28 PM    Final    NM Myocar Multi W/Spect Tamela Oddi Motion /  EF  Result Date: 08/19/2022   Findings are consistent with no ischemia. The study is low risk.   No ST deviation was noted.   Left ventricular function is normal. End diastolic cavity size is normal. Calculated LVEF = 75%   Three vessel coronary artery calcification noted.     Assessment/Plan 1.  Infrarenal abdominal aortic  aneurysm (AAA) without rupture (HCC) Recommend:  The aneurysm is > 5 cm and therefore should undergo repair. Patient is status post CT scan of the abdominal aorta. The patient is a candidate for endovascular repair.    The patient will require cardiac clearance prior to stent graft placement.    The patient will continue antiplatelet therapy as prescribed (since the patient is undergoing endovascular repair as opposed to open repair) as well as aggressive management of hyperlipidemia. Exercise is again strongly encouraged.    The patient is reminded that lifetime routine surveillance is a necessity with an endograft.    The risks and benefits of AAA repair are reviewed with the patient.  All questions are answered.  Alternative therapies are also discussed.  The patient agrees to proceed with endovascular aneurysm repair.   Patient will follow-up with me in the office after the surgery.   2. Atherosclerosis of native artery of both lower extremities with intermittent claudication (HCC)  Recommend:   The patient has evidence of atherosclerosis of the lower extremities with claudication.  The patient does not voice lifestyle limiting changes at this point in time.   Noninvasive studies do not suggest clinically significant change.   No invasive studies, angiography or surgery at this time The patient should continue walking and begin a more formal exercise program.  The patient should continue antiplatelet therapy and aggressive treatment of the lipid abnormalities   No changes in the patient's medications at this time   Continued surveillance is indicated as atherosclerosis is likely to progress with time.     The patient will continue follow up with noninvasive studies as ordered.   3. Mixed hyperlipidemia Continue statin as ordered and reviewed, no changes at this time     Hortencia Pilar, MD  09/09/2022 10:35 AM

## 2022-09-09 NOTE — Anesthesia Preprocedure Evaluation (Signed)
Anesthesia Evaluation  Patient identified by MRN, date of birth, ID band Patient awake    Reviewed: Allergy & Precautions, NPO status , Patient's Chart, lab work & pertinent test results  Airway Mallampati: II  TM Distance: >3 FB Neck ROM: full    Dental  (+) Edentulous Upper, Edentulous Lower   Pulmonary neg pulmonary ROS, Current Smoker and Patient abstained from smoking.   Pulmonary exam normal  + decreased breath sounds      Cardiovascular Exercise Tolerance: Poor hypertension, + CAD and + Peripheral Vascular Disease  negative cardio ROS Normal cardiovascular exam Rhythm:Regular Rate:Normal     Neuro/Psych negative neurological ROS  negative psych ROS   GI/Hepatic negative GI ROS, Neg liver ROS,,,  Endo/Other  negative endocrine ROS    Renal/GU negative Renal ROS  negative genitourinary   Musculoskeletal   Abdominal   Peds negative pediatric ROS (+)  Hematology negative hematology ROS (+) Blood dyscrasia, anemia   Anesthesia Other Findings Past Medical History: No date: Anemia No date: Aortic atherosclerosis (HCC) No date: Arthritis No date: Bilateral carpal tunnel syndrome No date: Coronary artery disease No date: Diastolic dysfunction     Comment:  a.) TTE 08/24/2022: EF 60-65%, mild-mod MR, G1DD. No date: ETOH abuse No date: Hepatic steatosis No date: History of methicillin resistant staphylococcus aureus (MRSA) No date: HLD (hyperlipidemia) No date: HTN (hypertension) 05/28/2022: Infrarenal abdominal aortic aneurysm (AAA) without  rupture (HCC)     Comment:  a.) CT AP 05/28/2022: saccular thrombosed; measures 4.4               x 3.2 cm No date: PVD (peripheral vascular disease) with claudication (HCC) No date: Sigmoid diverticulosis No date: Transaminitis  Past Surgical History: 08/20/2021: CERVICAL LAMINOPLASTY     Comment:  right C3-C6 No date: COLONOSCOPY No date: SKIN GRAFT     Comment:   to hand and forearm  BMI    Body Mass Index: 25.09 kg/m      Reproductive/Obstetrics negative OB ROS                             Anesthesia Physical Anesthesia Plan  ASA: 3  Anesthesia Plan: General   Post-op Pain Management:    Induction: Intravenous  PONV Risk Score and Plan: Ondansetron, Dexamethasone, Midazolam and Treatment may vary due to age or medical condition  Airway Management Planned: Oral ETT  Additional Equipment:   Intra-op Plan:   Post-operative Plan: Extubation in OR  Informed Consent: I have reviewed the patients History and Physical, chart, labs and discussed the procedure including the risks, benefits and alternatives for the proposed anesthesia with the patient or authorized representative who has indicated his/her understanding and acceptance.     Dental Advisory Given  Plan Discussed with: CRNA and Surgeon  Anesthesia Plan Comments:        Anesthesia Quick Evaluation

## 2022-09-09 NOTE — Op Note (Signed)
OPERATIVE NOTE   PROCEDURE: US guidance for vascular access, bilateral femoral arteries Catheter placement into aorta from bilateral femoral approaches Placement of a 26 mm proximal 14 cm length Gore Excluder Endoprosthesis main body right with a 14 mm diameter by 12 cm length left contralateral limb ProGlide closure devices bilateral femoral arteries  PRE-OPERATIVE DIAGNOSIS: AAA, saccular and rapidly expanding  POST-OPERATIVE DIAGNOSIS: same  SURGEON: Leotis Pain, MD and Hortencia Pilar, MD - Co-surgeons  ANESTHESIA: General  ESTIMATED BLOOD LOSS: 50 cc  FINDING(S): 1.  AAA  SPECIMEN(S):  none  INDICATIONS:   Ryan Rose is a 62 y.o. male who presents with a saccular rapidly expanding abdominal aortic aneurysm at risk for lethal rupture. The anatomy was suitable for endovascular repair.  Risks and benefits of repair in an endovascular fashion were discussed and informed consent was obtained. Co-surgeons are used to expedite the procedure and reduce operative time as bilateral work needs to be done.  DESCRIPTION: After obtaining full informed written consent, the patient was brought back to the operating room and placed supine upon the operating table.  The patient received IV antibiotics prior to induction.  After obtaining adequate anesthesia, the patient was prepped and draped in the standard fashion for endovascular AAA repair.  We then began by gaining access to both femoral arteries with US guidance with me working on the left and Dr. Delana Meyer working on the right.  The femoral arteries were found to be patent and accessed without difficulty with a needle under ultrasound guidance without difficulty on each side and permanent images were recorded.  We then placed 2 proglide devices on each side in a pre-close fashion and placed 8 French sheaths. The patient was then given 8000 units of intravenous heparin. The Pigtail catheter was placed into the aorta from the left side.  Using this image, we selected a 26 mm diameter by 14 cm length Main body device.  Over a stiff wire, an 16 French sheath was placed up the right side over a stiff wire. The main body was then placed through the 16 French sheath. A Kumpe catheter was placed up the left side and a magnified image at the renal arteries was performed. The main body was then deployed just below the lowest renal artery which was the left renal artery. The Kumpe catheter was used to cannulate the contralateral gate without difficulty and successful cannulation was confirmed by twirling the pigtail catheter in the main body. We then placed a stiff wire and a retrograde arteriogram was performed through the left femoral sheath. We upsized to the 12 [place] sheath for the contralateral limb on the left side and a 14 mm diameter by 12 cm length left iliac limb was selected and deployed. The main body deployment was then completed. Based off the angiographic findings, extension limbs were not necessary. All junction points and seals zones were treated with the compliant balloon. The pigtail catheter was then replaced and a completion angiogram was performed.  No apparent endoleak was detected on completion angiography. The renal arteries were found to be widely patent.  Both hypogastric arteries were widely patent. At this point we elected to terminate the procedure. We secured the pro glide devices for hemostasis on the femoral arteries. The skin incision was closed with a 4-0 Monocryl. Dermabond and pressure dressing were placed. The patient was taken to the recovery room in stable condition having tolerated the procedure well.  COMPLICATIONS: none  CONDITION: stable  Leotis Pain  09/09/2022, 1:00 PM   This note was created with Dragon Medical transcription system. Any errors in dictation are purely unintentional.

## 2022-09-10 ENCOUNTER — Other Ambulatory Visit: Payer: Self-pay

## 2022-09-10 ENCOUNTER — Encounter: Payer: Self-pay | Admitting: Vascular Surgery

## 2022-09-10 LAB — CBC
HCT: 30.5 % — ABNORMAL LOW (ref 39.0–52.0)
Hemoglobin: 10.7 g/dL — ABNORMAL LOW (ref 13.0–17.0)
MCH: 34.5 pg — ABNORMAL HIGH (ref 26.0–34.0)
MCHC: 35.1 g/dL (ref 30.0–36.0)
MCV: 98.4 fL (ref 80.0–100.0)
Platelets: 169 10*3/uL (ref 150–400)
RBC: 3.1 MIL/uL — ABNORMAL LOW (ref 4.22–5.81)
RDW: 13 % (ref 11.5–15.5)
WBC: 8.3 10*3/uL (ref 4.0–10.5)
nRBC: 0 % (ref 0.0–0.2)

## 2022-09-10 LAB — BASIC METABOLIC PANEL
Anion gap: 9 (ref 5–15)
BUN: 9 mg/dL (ref 8–23)
CO2: 24 mmol/L (ref 22–32)
Calcium: 8 mg/dL — ABNORMAL LOW (ref 8.9–10.3)
Chloride: 101 mmol/L (ref 98–111)
Creatinine, Ser: 0.6 mg/dL — ABNORMAL LOW (ref 0.61–1.24)
GFR, Estimated: >60 mL/min (ref >60)
Glucose, Bld: 204 mg/dL — ABNORMAL HIGH (ref 70–99)
Potassium: 3.6 mmol/L (ref 3.5–5.1)
Sodium: 134 mmol/L — ABNORMAL LOW (ref 135–145)

## 2022-09-10 MED ORDER — CLOPIDOGREL BISULFATE 75 MG PO TABS
75.0000 mg | ORAL_TABLET | Freq: Every day | ORAL | 3 refills | Status: DC
  Filled 2022-09-10: qty 30, 30d supply, fill #0

## 2022-09-10 MED ORDER — CLOPIDOGREL BISULFATE 75 MG PO TABS
75.0000 mg | ORAL_TABLET | Freq: Every day | ORAL | Status: DC
  Administered 2022-09-10: 75 mg via ORAL
  Filled 2022-09-10: qty 1

## 2022-09-10 NOTE — Discharge Summary (Signed)
[facility]    Discharge Summary    Patient ID:  Ryan Rose MRN: [id number] DOB/AGE: Dec 21, 1960 62 y.o.  Admit date: 09/09/2022 Discharge date: 09/10/2022 Date of Surgery: 09/09/2022 Surgeon: Surgeon(s): Schnier, Dolores Lory, MD Algernon Huxley, MD  Admission Diagnosis: Abdominal aortic aneurysm (AAA) [facility]) [I71.40]  Discharge Diagnoses:  Abdominal aortic aneurysm (AAA) ([place]) [I71.40]  Secondary Diagnoses: Past Medical History:  Diagnosis Date   Anemia    Aortic atherosclerosis ([place])    Arthritis    Bilateral carpal tunnel syndrome    Coronary artery disease    Diastolic dysfunction    a.) TTE 08/24/2022: EF 60-65%, mild-mod MR, G1DD.   ETOH abuse    Hepatic steatosis    History of methicillin resistant staphylococcus aureus (MRSA)    HLD (hyperlipidemia)    HTN (hypertension)    Infrarenal abdominal aortic aneurysm (AAA) without rupture ([place]) 05/28/2022   a.) CT AP 05/28/2022: saccular thrombosed; measures 4.4 x 3.2 cm   PVD (peripheral vascular disease) with claudication (HCC)    Sigmoid diverticulosis    Transaminitis     Procedure(s): ENDOVASCULAR REPAIR/STENT GRAFT  Discharged Condition: good  HPI:  Mr Ryan Rose is a 62 yo male who is now POD #1 from a triple AAA repair endo -vascular. Patient is resting comfortably in bed this morning. No complaints and vitals all remain stable. Bilateral groin sites are clean dry and intact. No hematoma or seroma to note. Bilateral lower extremities are warm to touch and have palpable pulses.    Hospital Course:  Ryan Rose is a 62 y.o. male is S/P Bilateral Extubated: POD # 0 Physical Exam:  Alert notes x3, no acute distress Face: Symmetrical.  Tongue is midline. Neck: Trachea is midline.  No swelling or bruising. Cardiovascular: Regular rate and rhythm Pulmonary: Clear to auscultation bilaterally Abdomen: Soft, nontender, nondistended Right groin access: Clean dry and intact.   No swelling or drainage noted Left groin access: Clean dry and intact.  No swelling or drainage noted Left lower extremity: Thigh soft.  Calf soft.  Extremities warm distally toes.  Hard to palpate pedal pulses however the foot is warm is her good capillary refill. Right lower extremity: Thigh soft.  Calf soft.  Extremities warm distally toes.  Hard to palpate pedal pulses however the foot is warm is her good capillary refill. Neurological: No deficits noted   Post-op wounds:  clean, dry, intact or healing well  Pt. Ambulating, voiding and taking PO diet without difficulty. Pt pain controlled with PO pain meds.  Labs:  As below  Complications: none  Consults:    Significant Diagnostic Studies: CBC Lab Results  Component Value Date   WBC 8.3 09/10/2022   HGB 10.7 (L) 09/10/2022   HCT 30.5 (L) 09/10/2022   MCV 98.4 09/10/2022   PLT 169 09/10/2022    BMET    Component Value Date/Time   NA 134 (L) 09/10/2022 0520   NA 140 11/25/2014 1016   K 3.6 09/10/2022 0520   K 3.6 11/25/2014 1016   CL 101 09/10/2022 0520   CL 107 11/25/2014 1016   CO2 24 09/10/2022 0520   CO2 16 (L) 11/25/2014 1016   GLUCOSE 204 (H) 09/10/2022 0520   GLUCOSE 91 11/25/2014 1016   BUN 9 09/10/2022 0520   BUN 13 11/25/2014 1016   CREATININE 0.60 (L) 09/10/2022 0520   CREATININE 1.05 11/25/2014 1016   CALCIUM 8.0 (L) 09/10/2022 0520   CALCIUM 8.4 (L) 11/25/2014  1016   GFRNONAA >60 09/10/2022 0520   GFRNONAA >60 11/25/2014 1016   GFRAA >60 02/18/2019 0259   GFRAA >60 11/25/2014 1016   COAG Lab Results  Component Value Date   INR 1.2 09/09/2022   INR 1.0 05/28/2022   INR 1.0 08/06/2021     Disposition:  Discharge to :Home  Allergies as of 09/10/2022       Reactions   Sulfa Antibiotics Hives        Medication List     TAKE these medications    acetaminophen 500 MG tablet Commonly known as: TYLENOL Take one or two tablet by mouth every 6  hours as needed for pain   aspirin  81 MG chewable tablet Chew 81 mg by mouth daily as needed.   atorvastatin 40 MG tablet Commonly known as: LIPITOR Take 1 tablet (40 mg total) by mouth daily.   clopidogrel 75 MG tablet Commonly known as: PLAVIX Take 1 tablet (75 mg total) by mouth daily.   losartan 25 MG tablet Commonly known as: COZAAR Take 1 tablet (25 mg total) by mouth daily.   senna-docusate 8.6-50 MG tablet Commonly known as: Senokot-S Take 2 tablets by mouth at bedtime. For constipation.  Also available over-the-counter   traMADol 50 MG tablet Commonly known as: ULTRAM Take 1 tablet (50 mg total) by mouth every 6 (six) hours as needed for up to 20 doses for moderate pain or severe pain.       Verbal and written Discharge instructions given to the patient. Wound care per Discharge AVS  Follow-up Information     Schnier, Dolores Lory, MD Follow up in 4 week(s).   Specialties: Vascular Surgery, Cardiology, Radiology, Vascular Surgery Why: Follow up Dr Delana Meyer with [facility] in office Contact information: [place] [address] [phone]                 Signed: Drema Pry, NP  09/10/2022, 8:58 AM

## 2022-09-10 NOTE — Progress Notes (Addendum)
Patient ambulated in hallway with walker (uses cane at home). All vitals WNL. Patient complaint of feeling stiff and wanting to lay back down. Patient is alert and oriented, however exhibits some symptoms of withdrawal. See corresponding flowsheet. NP Pace on unit and made aware. Okay to administer PRN oral ativan. Waiting for patient to urinate since discontinuation of foley catheter this AM. Okay per NP to Discharge after patient urinates.

## 2022-09-10 NOTE — Discharge Instructions (Signed)
   You can also follow up with your primary care physician to discuss your alcohol use.     Intensive Outpatient Programs   [facility] [address] [place],  [place], [place] (Inpatient and outpatient)[phone] (Suboxone and Methadone) [address] [phone]  [facility]: [facility] - Intensive Outpatient [place] [address] [place], [place], [place] (Outpatient, Inpatient, [facility] (Groups and Residental) (insurance only) [place], [place]   [facility] (for caregivers and family) [address] [place], [place]  Residential Treatment Programs  [facility](2 years) Residential: 43 days)[facility] ([place], [place], [place] or [phone]  [facility] [place], [place], [place]  [place] (RTS) [place], [place], [place] Admissions: 8am-3pm M-F  [facility] Program: Residential Program [phone] Days)             [facility]: [facility]  [place], [place], [place] or [phone] in Hours over the weekend or by referral)   [place] Crisis: [facility]:[phone] (for crisis response 24 hours a day)

## 2022-09-10 NOTE — Progress Notes (Signed)
Patient voided 100 mls of urine into urinal. Post void bladder scan shows 47mls.

## 2022-09-10 NOTE — Anesthesia Postprocedure Evaluation (Signed)
Anesthesia Post Note  Patient: Ryan Rose  Procedure(s) Performed: ENDOVASCULAR REPAIR/STENT GRAFT  Patient location during evaluation: ICU Anesthesia Type: General Level of consciousness: awake and alert and oriented Pain management: pain level controlled Vital Signs Assessment: post-procedure vital signs reviewed and stable Respiratory status: patient remains intubated per anesthesia plan Cardiovascular status: stable Postop Assessment: no apparent nausea or vomiting Anesthetic complications: no  No notable events documented.   Last Vitals:  Vitals:   09/10/22 0500 09/10/22 0600  BP: 107/72 107/85  Pulse: 96 91  Resp: 16 13  Temp:  37 C  SpO2: 94% 96%    Last Pain:  Vitals:   09/10/22 0600  TempSrc: Oral  PainSc: 0-No pain                 Blima Singer
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
## 2023-02-12 ENCOUNTER — Emergency Department
Admission: EM | Admit: 2023-02-12 | Discharge: 2023-02-12 | Disposition: A | Discharge status: Home / Self Care | Payer: Commercial Managed Care - PPO | Attending: Emergency Medicine | Admitting: Emergency Medicine

## 2023-02-12 ENCOUNTER — Emergency Department: Payer: Commercial Managed Care - PPO

## 2023-02-12 ENCOUNTER — Encounter: Payer: Self-pay | Admitting: Emergency Medicine

## 2023-02-12 DIAGNOSIS — K838 Other specified diseases of biliary tract: Secondary | ICD-10-CM | POA: Diagnosis not present

## 2023-02-12 DIAGNOSIS — R1013 Epigastric pain: Secondary | ICD-10-CM | POA: Diagnosis not present

## 2023-02-12 DIAGNOSIS — F1010 Alcohol abuse, uncomplicated: Secondary | ICD-10-CM | POA: Diagnosis not present

## 2023-02-12 DIAGNOSIS — E876 Hypokalemia: Secondary | ICD-10-CM | POA: Insufficient documentation

## 2023-02-12 DIAGNOSIS — F10930 Alcohol use, unspecified with withdrawal, uncomplicated: Secondary | ICD-10-CM

## 2023-02-12 DIAGNOSIS — F10239 Alcohol dependence with withdrawal, unspecified: Secondary | ICD-10-CM | POA: Insufficient documentation

## 2023-02-12 DIAGNOSIS — R7401 Elevation of levels of liver transaminase levels: Secondary | ICD-10-CM | POA: Diagnosis not present

## 2023-02-12 LAB — CBC WITH DIFFERENTIAL/PLATELET
Abs Immature Granulocytes: 0.03 10*3/uL (ref 0.00–0.07)
Basophils Absolute: 0 10*3/uL (ref 0.0–0.1)
Basophils Relative: 1 %
Eosinophils Absolute: 0 10*3/uL (ref 0.0–0.5)
Eosinophils Relative: 0 %
HCT: 30 % — ABNORMAL LOW (ref 39.0–52.0)
Hemoglobin: 10.4 g/dL — ABNORMAL LOW (ref 13.0–17.0)
Immature Granulocytes: 1 %
Lymphocytes Relative: 17 %
Lymphs Abs: 1.1 10*3/uL (ref 0.7–4.0)
MCH: 34.6 pg — ABNORMAL HIGH (ref 26.0–34.0)
MCHC: 34.7 g/dL (ref 30.0–36.0)
MCV: 99.7 fL (ref 80.0–100.0)
Monocytes Absolute: 0.7 10*3/uL (ref 0.1–1.0)
Monocytes Relative: 11 %
Neutro Abs: 4.6 10*3/uL (ref 1.7–7.7)
Neutrophils Relative %: 70 %
Platelets: 215 10*3/uL (ref 150–400)
RBC: 3.01 MIL/uL — ABNORMAL LOW (ref 4.22–5.81)
RDW: 15.2 % (ref 11.5–15.5)
WBC: 6.5 10*3/uL (ref 4.0–10.5)
nRBC: 0 % (ref 0.0–0.2)

## 2023-02-12 LAB — COMPREHENSIVE METABOLIC PANEL
ALT: 29 U/L (ref 0–44)
AST: 150 U/L — ABNORMAL HIGH (ref 15–41)
Albumin: 3.1 g/dL — ABNORMAL LOW (ref 3.5–5.0)
Alkaline Phosphatase: 222 U/L — ABNORMAL HIGH (ref 38–126)
Anion gap: 16 — ABNORMAL HIGH (ref 5–15)
BUN: 5 mg/dL — ABNORMAL LOW (ref 8–23)
CO2: 23 mmol/L (ref 22–32)
Calcium: 7.9 mg/dL — ABNORMAL LOW (ref 8.9–10.3)
Chloride: 95 mmol/L — ABNORMAL LOW (ref 98–111)
Creatinine, Ser: 0.44 mg/dL — ABNORMAL LOW (ref 0.61–1.24)
GFR, Estimated: >60 mL/min (ref >60)
Glucose, Bld: 119 mg/dL — ABNORMAL HIGH (ref 70–99)
Potassium: 3 mmol/L — ABNORMAL LOW (ref 3.5–5.1)
Sodium: 134 mmol/L — ABNORMAL LOW (ref 135–145)
Total Bilirubin: 2.2 mg/dL — ABNORMAL HIGH (ref 0.3–1.2)
Total Protein: 6.8 g/dL (ref 6.5–8.1)

## 2023-02-12 LAB — LIPASE, BLOOD: Lipase: 31 U/L (ref 11–51)

## 2023-02-12 LAB — URINALYSIS, ROUTINE W REFLEX MICROSCOPIC
Bilirubin Urine: NEGATIVE
Glucose, UA: NEGATIVE mg/dL
Hgb urine dipstick: NEGATIVE
Ketones, ur: 20 mg/dL — AB
Leukocytes,Ua: NEGATIVE
Nitrite: NEGATIVE
Protein, ur: NEGATIVE mg/dL
Specific Gravity, Urine: 1.017 (ref 1.005–1.030)
pH: 5 (ref 5.0–8.0)

## 2023-02-12 LAB — MAGNESIUM: Magnesium: 1.3 mg/dL — ABNORMAL LOW (ref 1.7–2.4)

## 2023-02-12 MED ORDER — POTASSIUM CHLORIDE CRYS ER 20 MEQ PO TBCR
40.0000 meq | EXTENDED_RELEASE_TABLET | Freq: Once | ORAL | Status: AC
  Administered 2023-02-12: 40 meq via ORAL
  Filled 2023-02-12: qty 2

## 2023-02-12 MED ORDER — PHENOBARBITAL SODIUM 65 MG/ML IJ SOLN
65.0000 mg | Freq: Once | INTRAMUSCULAR | Status: AC
  Administered 2023-02-12: 65 mg via INTRAVENOUS
  Filled 2023-02-12: qty 1

## 2023-02-12 MED ORDER — LACTATED RINGERS IV BOLUS
1000.0000 mL | Freq: Once | INTRAVENOUS | Status: AC
  Administered 2023-02-12: 1000 mL via INTRAVENOUS

## 2023-02-12 MED ORDER — FAMOTIDINE IN NACL 20-0.9 MG/50ML-% IV SOLN
20.0000 mg | Freq: Once | INTRAVENOUS | Status: AC
  Administered 2023-02-12: 20 mg via INTRAVENOUS
  Filled 2023-02-12: qty 50

## 2023-02-12 MED ORDER — MAGNESIUM SULFATE 2 GM/50ML IV SOLN
2.0000 g | Freq: Once | INTRAVENOUS | Status: AC
  Administered 2023-02-12: 2 g via INTRAVENOUS
  Filled 2023-02-12: qty 50

## 2023-02-12 MED ORDER — ONDANSETRON HCL 4 MG/2ML IJ SOLN
4.0000 mg | Freq: Once | INTRAMUSCULAR | Status: AC
  Administered 2023-02-12: 4 mg via INTRAVENOUS
  Filled 2023-02-12: qty 2

## 2023-02-12 NOTE — Discharge Instructions (Addendum)
As we discussed, the medication (phenobarbital) last multiple days in your system.  If you feel like your withdrawal symptoms are worsening, if you have seizures or other worsening condition then please return to the ED.

## 2023-02-12 NOTE — ED Triage Notes (Signed)
Pt presents ambulatory to triage via POV with complaints of alcohol issues. Pt notes drinking liquor and stopped 2 days ago and was having some abdominal pain with associated nausea. Rates the pain 4/10 - no meds taken PTA. A&Ox4 at this time. Denies CP or SOB.

## 2023-02-12 NOTE — ED Provider Notes (Signed)
[facility] Provider Note    Event Date/Time   First MD Initiated Contact with Patient 02/12/23 [phone]     (approximate)   History   Alcohol Problem   HPI  Ryan Rose is a 62 y.o. male who presents to the ED for evaluation of Alcohol Problem   I reviewed vascular operative note from January.  AAA repair endovascularly.  Patient presents to the ED for evaluation of alcohol withdrawals.  He presents with his wife who provides supplemental history.  He reports drinking every day, at least a pint of moonshine.  Prior to the past couple days, he cannot recall the last time he did not drink.  Did not drink anything yesterday, and this morning he had to drink some wine because he was getting the shakes.  No falls or syncope, no seizure activity, no emesis or abdominal pain.  Some poor appetite and mild nausea but no pain.   Physical Exam   Triage Vital Signs: ED Triage Vitals  Enc Vitals Group     BP 02/12/23 0437 100/74     Pulse Rate 02/12/23 0437 (!) 120     Resp 02/12/23 0437 20     Temp 02/12/23 0437 98.2 F (36.8 C)     Temp Source 02/12/23 0437 Oral     SpO2 02/12/23 0437 98 %     Weight 02/12/23 0436 170 lb (77.1 kg)     Height 02/12/23 0436 5' 8.5" (1.74 m)     Head Circumference --      Peak Flow --      Pain Score 02/12/23 0436 4     Pain Loc --      Pain Edu? --      Excl. in GC? --     Most recent vital signs: Vitals:   02/12/23 0530 02/12/23 0630  BP: (!) 135/91 (!) 135/91  Pulse: 94 84  Resp: 19 20  Temp:    SpO2: 93% 95%    General: Awake, no distress.  Pleasant and conversational.  Somewhat tremulous but not delirious CV:  Good peripheral perfusion.  Resp:  Normal effort.  Abd:  No distention.  Soft and benign throughout.  No RUQ tenderness MSK:  No deformity noted.  Neuro:  No focal deficits appreciated. Other:     ED Results / Procedures / Treatments   Labs (all labs ordered are listed, but only abnormal  results are displayed) Labs Reviewed  COMPREHENSIVE METABOLIC PANEL - Abnormal; Notable for the following components:      Result Value   Sodium 134 (*)    Potassium 3.0 (*)    Chloride 95 (*)    Glucose, Bld 119 (*)    BUN 5 (*)    Creatinine, Ser 0.44 (*)    Calcium 7.9 (*)    Albumin 3.1 (*)    AST 150 (*)    Alkaline Phosphatase 222 (*)    Total Bilirubin 2.2 (*)    Anion gap 16 (*)    All other components within normal limits  CBC WITH DIFFERENTIAL/PLATELET - Abnormal; Notable for the following components:   RBC 3.01 (*)    Hemoglobin 10.4 (*)    HCT 30.0 (*)    MCH 34.6 (*)    All other components within normal limits  MAGNESIUM - Abnormal; Notable for the following components:   Magnesium 1.3 (*)    All other components within normal limits  LIPASE, BLOOD  URINALYSIS, ROUTINE W REFLEX  MICROSCOPIC    EKG   RADIOLOGY RUQ ultrasound interpreted by me with sludge and normal CBD  Official radiology report(s): US ABDOMEN LIMITED RUQ (LIVER/GB)  Result Date: 02/12/2023 CLINICAL DATA:  Alcohol abuse with epigastric abdominal pain and transaminitis. EXAM: ULTRASOUND ABDOMEN LIMITED RIGHT UPPER QUADRANT COMPARISON:  CT AP 05/28/2022. FINDINGS: Gallbladder: Sludge noted within the gallbladder. No wall thickening, pericholecystic fluid or sonographic Murphy's sign. Common bile duct: Diameter: 4.2 mm.  No intrahepatic bile duct dilatation. Liver: Diffuse increased parenchymal echogenicity is identified throughout the liver. No focal liver abnormality. Portal vein is patent on color Doppler imaging with normal direction of blood flow towards the liver. Other: None. IMPRESSION: 1. Gallbladder sludge. 2. Hepatic steatosis. Electronically Signed   By: Signa Kell M.D.   On: 02/12/2023 06:59    PROCEDURES and INTERVENTIONS:  Procedures  Medications  lactated ringers bolus 1,000 mL (0 mLs Intravenous Stopped 02/12/23 0557)  ondansetron (ZOFRAN) injection 4 mg (4 mg Intravenous  Given 02/12/23 0511)  famotidine (PEPCID) IVPB 20 mg premix (0 mg Intravenous Stopped 02/12/23 0542)  PHENObarbital (LUMINAL) injection 65 mg (65 mg Intravenous Given 02/12/23 0504)  potassium chloride SA (KLOR-CON M) CR tablet 40 mEq (40 mEq Oral Given 02/12/23 0554)  magnesium sulfate IVPB 2 g 50 mL (0 g Intravenous Stopped 02/12/23 0652)  PHENObarbital (LUMINAL) injection 65 mg (65 mg Intravenous Given 02/12/23 0651)     IMPRESSION / MDM / ASSESSMENT AND PLAN / ED COURSE  I reviewed the triage vital signs and the nursing notes.  Differential diagnosis includes, but is not limited to, gastritis, biliary colic, alcoholic ketoacidosis, dehydration, withdrawals, polysubstance abuse  {Patient presents with symptoms of an acute illness or injury that is potentially life-threatening.  Pleasant 62 year old presents with alcohol withdrawals.  No severe features such as DTs or seizures, but does have some electrolyte derangements that we will replace orally and IV.  Marginally elevated anion gap likely represents a very mild element of AKA, we will provide IV fluid resuscitation and oral nutrition.  Normal lipase.  Mild normocytic anemia without bleeding symptoms.  Will continue to hydrate, IV fluids, phenobarbital and reassess  Patient responding well to phenobarbital, total of 130 mg and he subsequently not tremulous, looks well and is appreciative.  No seizures or DTs.  We discussed risks and benefits and he is comfortable going home.  We discussed outpatient follow-up with [facility] and I provided rehab resources for both residential and outpatient treatment centers.  We discussed return precautions and patient is suitable for outpatient management.  Clinical Course as of 02/12/23 0730  Fri Feb 12, 2023  0540 Reassessed and reexamined.  No abdominal pain or tenderness. [DS]  1610 Reassessed, feeling well, comfortable going home.  His wife is agreeable as well.  We discussed outpatient resources, return precautions  and expectant management. [DS]    Clinical Course User Index [DS] Delton Prairie, MD     FINAL CLINICAL IMPRESSION(S) / ED DIAGNOSES   Final diagnoses:  Alcohol withdrawal syndrome without complication (HCC)  Hypokalemia  Hypomagnesemia     Rx / DC Orders   ED Discharge Orders     None        Note:  This document was prepared using Dragon voice recognition software and may include unintentional dictation errors.   Delton Prairie, MD 02/12/23 [phone]

## 2023-02-12 NOTE — ED Provider Notes (Signed)
Vitals:   02/12/23 0530 02/12/23 0630  BP: (!) 135/91 (!) 135/91  Pulse: 94 84  Resp: 19 20  Temp:    SpO2: 93% 95%   Patient resting comfortably at this time in no acute distress.  He I discussed with him and he reports no history of any severe withdrawals.  Advises he does not get any severe shakes no history of seizures.  He plans to abstain from alcohol and follow-up with outpatient resources.  He was feeling a little bit sweaty and slightly nauseated earlier but this seems to have improved.  He has received 2 doses of phenobarbital here and his symptoms seem well-controlled at this time.  Appropriate for discharge   Sharyn Creamer, MD 02/12/23 1109

## 2023-03-21 ENCOUNTER — Inpatient Hospital Stay: Payer: Commercial Managed Care - PPO

## 2023-03-21 ENCOUNTER — Other Ambulatory Visit: Payer: Self-pay

## 2023-03-21 ENCOUNTER — Inpatient Hospital Stay
Admission: EM | Admit: 2023-03-21 | Discharge: 2023-04-23 | DRG: 368 | Disposition: A | Discharge status: Skilled Nursing Facility | Payer: Commercial Managed Care - PPO | Attending: Hospitalist | Admitting: Hospitalist

## 2023-03-21 ENCOUNTER — Encounter: Payer: Self-pay | Admitting: Internal Medicine

## 2023-03-21 ENCOUNTER — Emergency Department: Payer: Commercial Managed Care - PPO

## 2023-03-21 DIAGNOSIS — R6520 Severe sepsis without septic shock: Secondary | ICD-10-CM | POA: Diagnosis present

## 2023-03-21 DIAGNOSIS — A419 Sepsis, unspecified organism: Secondary | ICD-10-CM | POA: Diagnosis not present

## 2023-03-21 DIAGNOSIS — D72829 Elevated white blood cell count, unspecified: Secondary | ICD-10-CM | POA: Diagnosis present

## 2023-03-21 DIAGNOSIS — K810 Acute cholecystitis: Secondary | ICD-10-CM | POA: Diagnosis not present

## 2023-03-21 DIAGNOSIS — Z8614 Personal history of Methicillin resistant Staphylococcus aureus infection: Secondary | ICD-10-CM

## 2023-03-21 DIAGNOSIS — J9811 Atelectasis: Secondary | ICD-10-CM | POA: Diagnosis not present

## 2023-03-21 DIAGNOSIS — M19032 Primary osteoarthritis, left wrist: Secondary | ICD-10-CM | POA: Diagnosis not present

## 2023-03-21 DIAGNOSIS — Z981 Arthrodesis status: Secondary | ICD-10-CM

## 2023-03-21 DIAGNOSIS — E871 Hypo-osmolality and hyponatremia: Secondary | ICD-10-CM | POA: Diagnosis not present

## 2023-03-21 DIAGNOSIS — K838 Other specified diseases of biliary tract: Secondary | ICD-10-CM | POA: Diagnosis not present

## 2023-03-21 DIAGNOSIS — I5032 Chronic diastolic (congestive) heart failure: Secondary | ICD-10-CM | POA: Diagnosis present

## 2023-03-21 DIAGNOSIS — R9389 Abnormal findings on diagnostic imaging of other specified body structures: Secondary | ICD-10-CM | POA: Diagnosis not present

## 2023-03-21 DIAGNOSIS — D62 Acute posthemorrhagic anemia: Secondary | ICD-10-CM

## 2023-03-21 DIAGNOSIS — R112 Nausea with vomiting, unspecified: Secondary | ICD-10-CM | POA: Diagnosis not present

## 2023-03-21 DIAGNOSIS — K449 Diaphragmatic hernia without obstruction or gangrene: Secondary | ICD-10-CM | POA: Diagnosis present

## 2023-03-21 DIAGNOSIS — Z66 Do not resuscitate: Secondary | ICD-10-CM | POA: Diagnosis not present

## 2023-03-21 DIAGNOSIS — F10939 Alcohol use, unspecified with withdrawal, unspecified: Secondary | ICD-10-CM | POA: Diagnosis present

## 2023-03-21 DIAGNOSIS — Z515 Encounter for palliative care: Secondary | ICD-10-CM

## 2023-03-21 DIAGNOSIS — R4182 Altered mental status, unspecified: Secondary | ICD-10-CM | POA: Diagnosis not present

## 2023-03-21 DIAGNOSIS — I959 Hypotension, unspecified: Secondary | ICD-10-CM

## 2023-03-21 DIAGNOSIS — I110 Hypertensive heart disease with heart failure: Secondary | ICD-10-CM | POA: Diagnosis present

## 2023-03-21 DIAGNOSIS — E8729 Other acidosis: Principal | ICD-10-CM | POA: Diagnosis present

## 2023-03-21 DIAGNOSIS — Z6821 Body mass index (BMI) 21.0-21.9, adult: Secondary | ICD-10-CM

## 2023-03-21 DIAGNOSIS — Z8249 Family history of ischemic heart disease and other diseases of the circulatory system: Secondary | ICD-10-CM

## 2023-03-21 DIAGNOSIS — F0280 Dementia in other diseases classified elsewhere without behavioral disturbance: Secondary | ICD-10-CM | POA: Diagnosis present

## 2023-03-21 DIAGNOSIS — R6521 Severe sepsis with septic shock: Secondary | ICD-10-CM | POA: Diagnosis not present

## 2023-03-21 DIAGNOSIS — Z7902 Long term (current) use of antithrombotics/antiplatelets: Secondary | ICD-10-CM

## 2023-03-21 DIAGNOSIS — J690 Pneumonitis due to inhalation of food and vomit: Secondary | ICD-10-CM | POA: Diagnosis not present

## 2023-03-21 DIAGNOSIS — J439 Emphysema, unspecified: Secondary | ICD-10-CM | POA: Diagnosis not present

## 2023-03-21 DIAGNOSIS — Z23 Encounter for immunization: Secondary | ICD-10-CM

## 2023-03-21 DIAGNOSIS — I739 Peripheral vascular disease, unspecified: Secondary | ICD-10-CM | POA: Diagnosis present

## 2023-03-21 DIAGNOSIS — E785 Hyperlipidemia, unspecified: Secondary | ICD-10-CM | POA: Diagnosis present

## 2023-03-21 DIAGNOSIS — Z7141 Alcohol abuse counseling and surveillance of alcoholic: Secondary | ICD-10-CM

## 2023-03-21 DIAGNOSIS — F10231 Alcohol dependence with withdrawal delirium: Secondary | ICD-10-CM | POA: Diagnosis not present

## 2023-03-21 DIAGNOSIS — Z743 Need for continuous supervision: Secondary | ICD-10-CM | POA: Diagnosis not present

## 2023-03-21 DIAGNOSIS — R0989 Other specified symptoms and signs involving the circulatory and respiratory systems: Secondary | ICD-10-CM | POA: Diagnosis not present

## 2023-03-21 DIAGNOSIS — K922 Gastrointestinal hemorrhage, unspecified: Secondary | ICD-10-CM | POA: Diagnosis not present

## 2023-03-21 DIAGNOSIS — I7143 Infrarenal abdominal aortic aneurysm, without rupture: Secondary | ICD-10-CM | POA: Diagnosis not present

## 2023-03-21 DIAGNOSIS — Z8679 Personal history of other diseases of the circulatory system: Secondary | ICD-10-CM

## 2023-03-21 DIAGNOSIS — K31819 Angiodysplasia of stomach and duodenum without bleeding: Secondary | ICD-10-CM | POA: Diagnosis not present

## 2023-03-21 DIAGNOSIS — J438 Other emphysema: Secondary | ICD-10-CM | POA: Diagnosis present

## 2023-03-21 DIAGNOSIS — F1010 Alcohol abuse, uncomplicated: Secondary | ICD-10-CM | POA: Diagnosis not present

## 2023-03-21 DIAGNOSIS — Z5329 Procedure and treatment not carried out because of patient's decision for other reasons: Secondary | ICD-10-CM | POA: Diagnosis not present

## 2023-03-21 DIAGNOSIS — D638 Anemia in other chronic diseases classified elsewhere: Secondary | ICD-10-CM | POA: Diagnosis present

## 2023-03-21 DIAGNOSIS — F10931 Alcohol use, unspecified with withdrawal delirium: Secondary | ICD-10-CM | POA: Diagnosis not present

## 2023-03-21 DIAGNOSIS — F17210 Nicotine dependence, cigarettes, uncomplicated: Secondary | ICD-10-CM | POA: Diagnosis present

## 2023-03-21 DIAGNOSIS — R7989 Other specified abnormal findings of blood chemistry: Secondary | ICD-10-CM | POA: Diagnosis not present

## 2023-03-21 DIAGNOSIS — R509 Fever, unspecified: Secondary | ICD-10-CM | POA: Diagnosis not present

## 2023-03-21 DIAGNOSIS — D539 Nutritional anemia, unspecified: Secondary | ICD-10-CM | POA: Diagnosis not present

## 2023-03-21 DIAGNOSIS — K760 Fatty (change of) liver, not elsewhere classified: Secondary | ICD-10-CM | POA: Diagnosis not present

## 2023-03-21 DIAGNOSIS — E876 Hypokalemia: Secondary | ICD-10-CM | POA: Diagnosis not present

## 2023-03-21 DIAGNOSIS — K2970 Gastritis, unspecified, without bleeding: Secondary | ICD-10-CM | POA: Insufficient documentation

## 2023-03-21 DIAGNOSIS — K819 Cholecystitis, unspecified: Secondary | ICD-10-CM | POA: Diagnosis not present

## 2023-03-21 DIAGNOSIS — D649 Anemia, unspecified: Secondary | ICD-10-CM | POA: Diagnosis not present

## 2023-03-21 DIAGNOSIS — K209 Esophagitis, unspecified without bleeding: Secondary | ICD-10-CM | POA: Diagnosis not present

## 2023-03-21 DIAGNOSIS — Z79899 Other long term (current) drug therapy: Secondary | ICD-10-CM

## 2023-03-21 DIAGNOSIS — K920 Hematemesis: Secondary | ICD-10-CM | POA: Diagnosis not present

## 2023-03-21 DIAGNOSIS — K5730 Diverticulosis of large intestine without perforation or abscess without bleeding: Secondary | ICD-10-CM | POA: Diagnosis not present

## 2023-03-21 DIAGNOSIS — J432 Centrilobular emphysema: Secondary | ICD-10-CM | POA: Diagnosis not present

## 2023-03-21 DIAGNOSIS — E8809 Other disorders of plasma-protein metabolism, not elsewhere classified: Secondary | ICD-10-CM | POA: Diagnosis present

## 2023-03-21 DIAGNOSIS — I2510 Atherosclerotic heart disease of native coronary artery without angina pectoris: Secondary | ICD-10-CM | POA: Diagnosis present

## 2023-03-21 DIAGNOSIS — J90 Pleural effusion, not elsewhere classified: Secondary | ICD-10-CM | POA: Diagnosis not present

## 2023-03-21 DIAGNOSIS — R339 Retention of urine, unspecified: Secondary | ICD-10-CM | POA: Diagnosis not present

## 2023-03-21 DIAGNOSIS — R7401 Elevation of levels of liver transaminase levels: Secondary | ICD-10-CM | POA: Diagnosis not present

## 2023-03-21 DIAGNOSIS — K2920 Alcoholic gastritis without bleeding: Secondary | ICD-10-CM | POA: Diagnosis present

## 2023-03-21 DIAGNOSIS — S0990XA Unspecified injury of head, initial encounter: Secondary | ICD-10-CM | POA: Diagnosis not present

## 2023-03-21 DIAGNOSIS — Z751 Person awaiting admission to adequate facility elsewhere: Secondary | ICD-10-CM

## 2023-03-21 DIAGNOSIS — J441 Chronic obstructive pulmonary disease with (acute) exacerbation: Secondary | ICD-10-CM | POA: Diagnosis present

## 2023-03-21 DIAGNOSIS — R000 Tachycardia, unspecified: Secondary | ICD-10-CM | POA: Diagnosis not present

## 2023-03-21 DIAGNOSIS — R918 Other nonspecific abnormal finding of lung field: Secondary | ICD-10-CM | POA: Diagnosis not present

## 2023-03-21 DIAGNOSIS — K7011 Alcoholic hepatitis with ascites: Secondary | ICD-10-CM | POA: Diagnosis present

## 2023-03-21 DIAGNOSIS — R935 Abnormal findings on diagnostic imaging of other abdominal regions, including retroperitoneum: Secondary | ICD-10-CM | POA: Diagnosis not present

## 2023-03-21 DIAGNOSIS — R0789 Other chest pain: Secondary | ICD-10-CM | POA: Diagnosis not present

## 2023-03-21 DIAGNOSIS — I714 Abdominal aortic aneurysm, without rupture, unspecified: Secondary | ICD-10-CM | POA: Diagnosis present

## 2023-03-21 DIAGNOSIS — K829 Disease of gallbladder, unspecified: Secondary | ICD-10-CM | POA: Diagnosis not present

## 2023-03-21 DIAGNOSIS — K828 Other specified diseases of gallbladder: Secondary | ICD-10-CM | POA: Diagnosis not present

## 2023-03-21 DIAGNOSIS — K2091 Esophagitis, unspecified with bleeding: Principal | ICD-10-CM | POA: Diagnosis present

## 2023-03-21 DIAGNOSIS — R0602 Shortness of breath: Secondary | ICD-10-CM | POA: Diagnosis not present

## 2023-03-21 DIAGNOSIS — R188 Other ascites: Secondary | ICD-10-CM | POA: Diagnosis not present

## 2023-03-21 DIAGNOSIS — E440 Moderate protein-calorie malnutrition: Secondary | ICD-10-CM | POA: Diagnosis present

## 2023-03-21 DIAGNOSIS — E860 Dehydration: Secondary | ICD-10-CM | POA: Diagnosis present

## 2023-03-21 DIAGNOSIS — I700 Atherosclerosis of aorta: Secondary | ICD-10-CM | POA: Diagnosis present

## 2023-03-21 DIAGNOSIS — M25532 Pain in left wrist: Secondary | ICD-10-CM | POA: Diagnosis not present

## 2023-03-21 DIAGNOSIS — R0689 Other abnormalities of breathing: Secondary | ICD-10-CM | POA: Diagnosis not present

## 2023-03-21 DIAGNOSIS — E162 Hypoglycemia, unspecified: Secondary | ICD-10-CM | POA: Diagnosis not present

## 2023-03-21 DIAGNOSIS — Z882 Allergy status to sulfonamides status: Secondary | ICD-10-CM

## 2023-03-21 DIAGNOSIS — I10 Essential (primary) hypertension: Secondary | ICD-10-CM | POA: Diagnosis not present

## 2023-03-21 DIAGNOSIS — R1011 Right upper quadrant pain: Secondary | ICD-10-CM | POA: Diagnosis not present

## 2023-03-21 DIAGNOSIS — F10930 Alcohol use, unspecified with withdrawal, uncomplicated: Secondary | ICD-10-CM | POA: Diagnosis not present

## 2023-03-21 DIAGNOSIS — Z7982 Long term (current) use of aspirin: Secondary | ICD-10-CM

## 2023-03-21 DIAGNOSIS — R531 Weakness: Secondary | ICD-10-CM | POA: Diagnosis not present

## 2023-03-21 HISTORY — DX: Alcohol use, unspecified with withdrawal, unspecified: F10.939

## 2023-03-21 LAB — CBC WITH DIFFERENTIAL/PLATELET
Abs Immature Granulocytes: 0.12 10*3/uL — ABNORMAL HIGH (ref 0.00–0.07)
Basophils Absolute: 0.1 10*3/uL (ref 0.0–0.1)
Basophils Relative: 0 %
Eosinophils Absolute: 0 10*3/uL (ref 0.0–0.5)
Eosinophils Relative: 0 %
HCT: 32.7 % — ABNORMAL LOW (ref 39.0–52.0)
Hemoglobin: 10.6 g/dL — ABNORMAL LOW (ref 13.0–17.0)
Immature Granulocytes: 1 %
Lymphocytes Relative: 2 %
Lymphs Abs: 0.5 10*3/uL — ABNORMAL LOW (ref 0.7–4.0)
MCH: 34.6 pg — ABNORMAL HIGH (ref 26.0–34.0)
MCHC: 32.4 g/dL (ref 30.0–36.0)
MCV: 106.9 fL — ABNORMAL HIGH (ref 80.0–100.0)
Monocytes Absolute: 1.1 10*3/uL — ABNORMAL HIGH (ref 0.1–1.0)
Monocytes Relative: 5 %
Neutro Abs: 21.7 10*3/uL — ABNORMAL HIGH (ref 1.7–7.7)
Neutrophils Relative %: 92 %
Platelets: 297 10*3/uL (ref 150–400)
RBC: 3.06 MIL/uL — ABNORMAL LOW (ref 4.22–5.81)
RDW: 15.1 % (ref 11.5–15.5)
WBC: 23.4 10*3/uL — ABNORMAL HIGH (ref 4.0–10.5)
nRBC: 0.2 % (ref 0.0–0.2)

## 2023-03-21 LAB — BASIC METABOLIC PANEL
Anion gap: 25 — ABNORMAL HIGH (ref 5–15)
Anion gap: 24 — ABNORMAL HIGH (ref 5–15)
BUN: <5 mg/dL — ABNORMAL LOW (ref 8–23)
BUN: <5 mg/dL — ABNORMAL LOW (ref 8–23)
CO2: 10 mmol/L — ABNORMAL LOW (ref 22–32)
CO2: 10 mmol/L — ABNORMAL LOW (ref 22–32)
Calcium: 7.1 mg/dL — ABNORMAL LOW (ref 8.9–10.3)
Calcium: 7.3 mg/dL — ABNORMAL LOW (ref 8.9–10.3)
Chloride: 100 mmol/L (ref 98–111)
Chloride: 101 mmol/L (ref 98–111)
Creatinine, Ser: 0.62 mg/dL (ref 0.61–1.24)
Creatinine, Ser: 0.69 mg/dL (ref 0.61–1.24)
GFR, Estimated: >60 mL/min (ref >60)
GFR, Estimated: >60 mL/min (ref >60)
Glucose, Bld: 59 mg/dL — ABNORMAL LOW (ref 70–99)
Glucose, Bld: 159 mg/dL — ABNORMAL HIGH (ref 70–99)
Potassium: 3.4 mmol/L — ABNORMAL LOW (ref 3.5–5.1)
Potassium: 3.5 mmol/L (ref 3.5–5.1)
Sodium: 135 mmol/L (ref 135–145)
Sodium: 135 mmol/L (ref 135–145)

## 2023-03-21 LAB — PROTIME-INR
INR: 1.1 (ref 0.8–1.2)
Prothrombin Time: 14.2 seconds (ref 11.4–15.2)

## 2023-03-21 LAB — URINALYSIS, COMPLETE (UACMP) WITH MICROSCOPIC
Bacteria, UA: NONE SEEN
Bilirubin Urine: NEGATIVE
Glucose, UA: NEGATIVE mg/dL
Hgb urine dipstick: NEGATIVE
Ketones, ur: 80 mg/dL — AB
Leukocytes,Ua: NEGATIVE
Nitrite: NEGATIVE
Protein, ur: 30 mg/dL — AB
Specific Gravity, Urine: 1.03 (ref 1.005–1.030)
Squamous Epithelial / HPF: NONE SEEN /HPF (ref 0–5)
pH: 5 (ref 5.0–8.0)

## 2023-03-21 LAB — TYPE AND SCREEN
ABO/RH(D): O POS
Antibody Screen: NEGATIVE

## 2023-03-21 LAB — COMPREHENSIVE METABOLIC PANEL
ALT: 30 U/L (ref 0–44)
AST: 169 U/L — ABNORMAL HIGH (ref 15–41)
Albumin: 2.8 g/dL — ABNORMAL LOW (ref 3.5–5.0)
Alkaline Phosphatase: 207 U/L — ABNORMAL HIGH (ref 38–126)
Anion gap: 28 — ABNORMAL HIGH (ref 5–15)
BUN: 5 mg/dL — ABNORMAL LOW (ref 8–23)
CO2: 10 mmol/L — ABNORMAL LOW (ref 22–32)
Calcium: 7.6 mg/dL — ABNORMAL LOW (ref 8.9–10.3)
Chloride: 94 mmol/L — ABNORMAL LOW (ref 98–111)
Creatinine, Ser: 0.69 mg/dL (ref 0.61–1.24)
GFR, Estimated: >60 mL/min (ref >60)
Glucose, Bld: 59 mg/dL — ABNORMAL LOW (ref 70–99)
Potassium: 2.8 mmol/L — ABNORMAL LOW (ref 3.5–5.1)
Sodium: 132 mmol/L — ABNORMAL LOW (ref 135–145)
Total Bilirubin: 3.5 mg/dL — ABNORMAL HIGH (ref 0.3–1.2)
Total Protein: 7.4 g/dL (ref 6.5–8.1)

## 2023-03-21 LAB — HEMOGLOBIN AND HEMATOCRIT, BLOOD
HCT: 26.8 % — ABNORMAL LOW (ref 39.0–52.0)
HCT: 24.4 % — ABNORMAL LOW (ref 39.0–52.0)
Hemoglobin: 8.6 g/dL — ABNORMAL LOW (ref 13.0–17.0)
Hemoglobin: 8.3 g/dL — ABNORMAL LOW (ref 13.0–17.0)

## 2023-03-21 LAB — IRON AND TIBC
Iron: 192 ug/dL — ABNORMAL HIGH (ref 45–182)
Saturation Ratios: 87 % — ABNORMAL HIGH (ref 17.9–39.5)
TIBC: 221 ug/dL — ABNORMAL LOW (ref 250–450)
UIBC: 29 ug/dL

## 2023-03-21 LAB — RETICULOCYTES
Immature Retic Fract: 22.8 % — ABNORMAL HIGH (ref 2.3–15.9)
RBC.: 3.03 MIL/uL — ABNORMAL LOW (ref 4.22–5.81)
Retic Count, Absolute: 81.8 10*3/uL (ref 19.0–186.0)
Retic Ct Pct: 2.7 % (ref 0.4–3.1)

## 2023-03-21 LAB — PHOSPHORUS: Phosphorus: 4.7 mg/dL — ABNORMAL HIGH (ref 2.5–4.6)

## 2023-03-21 LAB — TROPONIN I (HIGH SENSITIVITY)
Troponin I (High Sensitivity): 15 ng/L (ref <18)
Troponin I (High Sensitivity): 12 ng/L (ref <18)

## 2023-03-21 LAB — CK: Total CK: 283 U/L (ref 49–397)

## 2023-03-21 LAB — CBG MONITORING, ED
Glucose-Capillary: 58 mg/dL — ABNORMAL LOW (ref 70–99)
Glucose-Capillary: 66 mg/dL — ABNORMAL LOW (ref 70–99)
Glucose-Capillary: 84 mg/dL (ref 70–99)
Glucose-Capillary: 126 mg/dL — ABNORMAL HIGH (ref 70–99)

## 2023-03-21 LAB — LIPASE, BLOOD: Lipase: 42 U/L (ref 11–51)

## 2023-03-21 LAB — ETHANOL: Alcohol, Ethyl (B): 107 mg/dL — ABNORMAL HIGH (ref <10)

## 2023-03-21 LAB — FERRITIN: Ferritin: 367 ng/mL — ABNORMAL HIGH (ref 24–336)

## 2023-03-21 LAB — VITAMIN B12: Vitamin B-12: 428 pg/mL (ref 180–914)

## 2023-03-21 LAB — MAGNESIUM: Magnesium: 1.2 mg/dL — ABNORMAL LOW (ref 1.7–2.4)

## 2023-03-21 LAB — GLUCOSE, CAPILLARY: Glucose-Capillary: 133 mg/dL — ABNORMAL HIGH (ref 70–99)

## 2023-03-21 MED ORDER — PANTOPRAZOLE 80MG IVPB - SIMPLE MED
80.0000 mg | Freq: Once | INTRAVENOUS | Status: AC
  Administered 2023-03-21: 80 mg via INTRAVENOUS
  Filled 2023-03-21: qty 100

## 2023-03-21 MED ORDER — ONDANSETRON HCL 4 MG/2ML IJ SOLN
4.0000 mg | INTRAMUSCULAR | Status: AC
  Administered 2023-03-21: 4 mg via INTRAVENOUS
  Filled 2023-03-21: qty 2

## 2023-03-21 MED ORDER — ONDANSETRON HCL 4 MG/2ML IJ SOLN: 4.0000 mg | Freq: Once | INTRAMUSCULAR | Status: DC

## 2023-03-21 MED ORDER — LORAZEPAM 2 MG/ML IJ SOLN
1.0000 mg | INTRAMUSCULAR | Status: AC | PRN
  Administered 2023-03-23 (×3): 2 mg via INTRAVENOUS
  Administered 2023-03-23: 1 mg via INTRAVENOUS
  Administered 2023-03-23: 2 mg via INTRAVENOUS
  Administered 2023-03-24: 1 mg via INTRAVENOUS
  Filled 2023-03-21 (×6): qty 1

## 2023-03-21 MED ORDER — SODIUM CHLORIDE 0.9 % IV SOLN: INTRAVENOUS | Status: DC

## 2023-03-21 MED ORDER — PNEUMOCOCCAL 20-VAL CONJ VACC 0.5 ML IM SUSY
0.5000 mL | PREFILLED_SYRINGE | INTRAMUSCULAR | Status: DC
  Filled 2023-03-21: qty 0.5

## 2023-03-21 MED ORDER — ACETAMINOPHEN 650 MG RE SUPP
650.0000 mg | Freq: Four times a day (QID) | RECTAL | Status: DC | PRN
  Administered 2023-03-30 – 2023-04-01 (×2): 650 mg via RECTAL
  Filled 2023-03-21 (×3): qty 1

## 2023-03-21 MED ORDER — MELATONIN 5 MG PO TABS
2.5000 mg | ORAL_TABLET | Freq: Every day | ORAL | Status: DC
  Administered 2023-03-22 – 2023-04-16 (×19): 2.5 mg via ORAL
  Filled 2023-03-21 (×24): qty 1

## 2023-03-21 MED ORDER — ATORVASTATIN CALCIUM 20 MG PO TABS
40.0000 mg | ORAL_TABLET | Freq: Every day | ORAL | Status: DC
  Administered 2023-03-22 – 2023-04-17 (×15): 40 mg via ORAL
  Filled 2023-03-21 (×21): qty 2

## 2023-03-21 MED ORDER — FENTANYL CITRATE PF 50 MCG/ML IJ SOSY
100.0000 ug | PREFILLED_SYRINGE | Freq: Once | INTRAMUSCULAR | Status: AC
  Administered 2023-03-21: 100 ug via INTRAVENOUS
  Filled 2023-03-21: qty 2

## 2023-03-21 MED ORDER — LORAZEPAM 1 MG PO TABS: 1.0000 mg | ORAL_TABLET | ORAL | Status: AC | PRN

## 2023-03-21 MED ORDER — ACETAMINOPHEN 325 MG PO TABS
650.0000 mg | ORAL_TABLET | Freq: Four times a day (QID) | ORAL | Status: DC | PRN
  Administered 2023-03-24 – 2023-04-04 (×3): 650 mg via ORAL
  Filled 2023-03-21 (×3): qty 2

## 2023-03-21 MED ORDER — ASPIRIN 81 MG PO CHEW: 81.0000 mg | CHEWABLE_TABLET | Freq: Every day | ORAL | Status: DC | PRN

## 2023-03-21 MED ORDER — MAGNESIUM SULFATE 2 GM/50ML IV SOLN
2.0000 g | Freq: Once | INTRAVENOUS | Status: AC
  Administered 2023-03-21: 2 g via INTRAVENOUS
  Filled 2023-03-21: qty 50

## 2023-03-21 MED ORDER — SODIUM CHLORIDE 0.9 % IV BOLUS
1000.0000 mL | Freq: Once | INTRAVENOUS | Status: AC
  Administered 2023-03-21: 1000 mL via INTRAVENOUS

## 2023-03-21 MED ORDER — PHENOL 1.4 % MT LIQD
1.0000 | OROMUCOSAL | Status: DC | PRN
  Filled 2023-03-21: qty 177

## 2023-03-21 MED ORDER — DEXTROSE-SODIUM CHLORIDE 5-0.9 % IV SOLN: INTRAVENOUS | Status: AC

## 2023-03-21 MED ORDER — IOHEXOL 350 MG/ML SOLN
80.0000 mL | Freq: Once | INTRAVENOUS | Status: AC | PRN
  Administered 2023-03-21: 80 mL via INTRAVENOUS

## 2023-03-21 MED ORDER — THIAMINE MONONITRATE 100 MG PO TABS
100.0000 mg | ORAL_TABLET | Freq: Every day | ORAL | Status: DC
  Administered 2023-03-22 – 2023-03-26 (×3): 100 mg via ORAL
  Filled 2023-03-21 (×7): qty 1

## 2023-03-21 MED ORDER — DEXTROSE 50 % IV SOLN
1.0000 | Freq: Once | INTRAVENOUS | Status: AC
  Administered 2023-03-21: 50 mL via INTRAVENOUS

## 2023-03-21 MED ORDER — DEXTROSE 50 % IV SOLN
INTRAVENOUS | Status: AC
  Filled 2023-03-21: qty 50

## 2023-03-21 MED ORDER — FOLIC ACID 1 MG PO TABS
1.0000 mg | ORAL_TABLET | Freq: Every day | ORAL | Status: DC
  Administered 2023-03-22 – 2023-04-17 (×15): 1 mg via ORAL
  Filled 2023-03-21 (×22): qty 1

## 2023-03-21 MED ORDER — LORAZEPAM 2 MG PO TABS
2.0000 mg | ORAL_TABLET | Freq: Two times a day (BID) | ORAL | Status: AC
  Administered 2023-03-22: 2 mg via ORAL
  Filled 2023-03-21: qty 1

## 2023-03-21 MED ORDER — ONDANSETRON HCL 4 MG/2ML IJ SOLN
4.0000 mg | Freq: Four times a day (QID) | INTRAMUSCULAR | Status: DC | PRN
  Administered 2023-03-22 – 2023-03-29 (×2): 4 mg via INTRAVENOUS
  Filled 2023-03-21 (×3): qty 2

## 2023-03-21 MED ORDER — PANTOPRAZOLE INFUSION (NEW) - SIMPLE MED
8.0000 mg/h | INTRAVENOUS | Status: DC
  Administered 2023-03-21: 8 mg/h via INTRAVENOUS
  Filled 2023-03-21: qty 100

## 2023-03-21 MED ORDER — PANTOPRAZOLE SODIUM 40 MG IV SOLR
40.0000 mg | Freq: Two times a day (BID) | INTRAVENOUS | Status: DC
  Administered 2023-03-21 – 2023-03-25 (×8): 40 mg via INTRAVENOUS
  Filled 2023-03-21 (×8): qty 10

## 2023-03-21 MED ORDER — POTASSIUM CHLORIDE CRYS ER 20 MEQ PO TBCR
40.0000 meq | EXTENDED_RELEASE_TABLET | ORAL | Status: AC
  Filled 2023-03-21: qty 2

## 2023-03-21 MED ORDER — ORAL CARE MOUTH RINSE: 15.0000 mL | OROMUCOSAL | Status: DC | PRN

## 2023-03-21 MED ORDER — CLOPIDOGREL BISULFATE 75 MG PO TABS
75.0000 mg | ORAL_TABLET | Freq: Every day | ORAL | Status: DC
  Filled 2023-03-21: qty 1

## 2023-03-21 MED ORDER — ADULT MULTIVITAMIN W/MINERALS CH
1.0000 | ORAL_TABLET | Freq: Every day | ORAL | Status: DC
  Administered 2023-03-22 – 2023-04-17 (×15): 1 via ORAL
  Filled 2023-03-21 (×22): qty 1

## 2023-03-21 MED ORDER — POTASSIUM CHLORIDE 10 MEQ/100ML IV SOLN
10.0000 meq | Freq: Once | INTRAVENOUS | Status: AC
  Administered 2023-03-21: 10 meq via INTRAVENOUS
  Filled 2023-03-21: qty 100

## 2023-03-21 MED ORDER — ENOXAPARIN SODIUM 40 MG/0.4ML IJ SOSY
40.0000 mg | PREFILLED_SYRINGE | INTRAMUSCULAR | Status: DC
  Filled 2023-03-21: qty 0.4

## 2023-03-21 MED ORDER — SENNOSIDES-DOCUSATE SODIUM 8.6-50 MG PO TABS
2.0000 | ORAL_TABLET | Freq: Every day | ORAL | Status: DC
  Administered 2023-03-22 – 2023-04-11 (×12): 2 via ORAL
  Filled 2023-03-21 (×15): qty 2

## 2023-03-21 MED ORDER — THIAMINE HCL 100 MG/ML IJ SOLN
100.0000 mg | Freq: Every day | INTRAMUSCULAR | Status: DC
  Administered 2023-03-24 – 2023-03-25 (×2): 100 mg via INTRAVENOUS
  Filled 2023-03-21 (×3): qty 2

## 2023-03-21 NOTE — ED Notes (Addendum)
This nurse went was reviewing Labs around 1240. This nurse noticed that the pt's BG was 59 on the 0649 labs and still 59 on the 0956 labs. This nurse was never notified about the low BG. This nurse asked the ED provider to see if he was notified about the Low BG and the provider denies being ever notified. This nurse immediately obtained a new BG and it was 58. The hospitalist was notified and gave verbal orders to give 'One amp of D50' and it was given. BG was checked about 15 mins later and it was 66. This nurse also notice some s/s of alcohol w/drawal the pt was experiencing, including nausea/vomiting, itchiness, and agitation. This nurse completed a CIWA that resulted a 13. The hospitalist was notified.

## 2023-03-21 NOTE — TOC CM/SW Note (Signed)
CSW received consult for SU resources. Added OPT & INPATIENT counseling resources to AVS.

## 2023-03-21 NOTE — ED Provider Notes (Signed)
[facility] Provider Note    Event Date/Time   First MD Initiated Contact with Patient 03/21/23 [id number]     (approximate)  History   Chief Complaint: Chest Pain  HPI  Ryan Rose is a 62 y.o. male with a past medical history of anemia, alcohol use, hypertension, hyperlipidemia, AAA, presents to the emergency department for central chest pain nausea and vomiting.  According to the patient for the last 2 days he has had nausea vomiting he has been experiencing pain in the center of his chest at times.  Denies any diarrhea, however EMS states when they arrived patient was lying on the floor and had soiled himself.  When asked the patient about this he states he was having bad pain so he lied down on the floor.  Physical Exam   Triage Vital Signs: ED Triage Vitals  Encounter Vitals Group     BP 03/21/23 0630 130/85     Systolic BP Percentile --      Diastolic BP Percentile --      Pulse Rate 03/21/23 0630 (!) 109     Resp 03/21/23 0630 (!) 22     Temp 03/21/23 0630 98.6 F (37 C)     Temp Source 03/21/23 0630 Oral     SpO2 03/21/23 0630 97 %     Weight 03/21/23 0633 144 lb 6.4 oz (65.5 kg)     Height 03/21/23 0633 5' 8.5" (1.74 m)     Head Circumference --      Peak Flow --      Pain Score 03/21/23 0634 9     Pain Loc --      Pain Education --      Exclude from Growth Chart --     Most recent vital signs: Vitals:   03/21/23 0700 03/21/23 0730  BP: (!) 143/100 129/86  Pulse: (!) 108 (!) 110  Resp: (!) 31 20  Temp:    SpO2: 97% 95%    General: Awake, mild distress moaning at times holding the center of his chest/upper abdomen. CV:  Good peripheral perfusion.  Regular rate and rhythm around 100 bpm. Resp:  Normal effort.  Equal breath sounds bilaterally.  No wheeze rales or rhonchi Abd:  No distention.  Soft, nontender.  No rebound or guarding.  Largely benign abdomen.  ED Results / Procedures / Treatments   EKG  EKG viewed and  interpreted by myself shows sinus tachycardia at 106 bpm with a narrow QRS, normal axis, normal intervals, nonspecific ST changes.  No ST elevation.  RADIOLOGY  I have reviewed and interpreted the CTA images no obvious abnormality on my evaluation. Radiology is read the CT scan as negative for acute abnormality prior AAA with no acute issues status post repair/graft.   MEDICATIONS ORDERED IN ED: Medications  fentaNYL (SUBLIMAZE) injection 100 mcg (has no administration in time range)  sodium chloride 0.9 % bolus 1,000 mL (has no administration in time range)  ondansetron (ZOFRAN) injection 4 mg (4 mg Intravenous Given 03/21/23 0650)  iohexol (OMNIPAQUE) 350 MG/ML injection 80 mL (80 mLs Intravenous Contrast Given 03/21/23 0751)     IMPRESSION / MDM / ASSESSMENT AND PLAN / ED COURSE  I reviewed the triage vital signs and the nursing notes.  Patient's presentation is most consistent with acute presentation with potential threat to life or bodily function.  Patient presents the emergency department for nausea vomiting abdominal/chest pain.  Given the patient's discomfort I have ordered to  100 mcg of fentanyl we will proceed with a CTA dissection protocol to rule out acute abnormality.  Patient appears to have a history of a AAA status post repair.  I reviewed the patient's historical labs there was no history of renal insufficiency asked radiology to proceed without labs. CTA was performed quickly showing no significant finding.  Chemistry however does show anion gap of 28 elevated LFTs likely due to chronic alcoholism and I suspect anion gap nausea and vomiting due to alcoholic ketoacidosis.  Glucose of 59.  Patient drinking.  Troponin is negative, CBC shows significant leukocytosis of 23,000 otherwise chronic anemia.  Suspect leukocytosis related to the patient's alcoholic ketoacidosis as well.  CK of 283.  Given the patient's likely alcoholic ketoacidosis significant dehydration we will continue  with IV hydration I ordered a second liter of fluids for the patient.  We will treat nausea I placed patient on Protonix infusion as well.  Will admit to the hospital service for ongoing workup and treatment.  FINAL CLINICAL IMPRESSION(S) / ED DIAGNOSES   Alcoholic ketoacidosis Nausea vomiting Dehydration  Note:  This document was prepared using Dragon voice recognition software and may include unintentional dictation errors.   Minna Antis, MD 03/21/23 1320

## 2023-03-21 NOTE — ED Notes (Signed)
Taking over care of pt at this time

## 2023-03-21 NOTE — ED Triage Notes (Signed)
Patient to come from home via EMS for chest pain and nausea/vomiting. Patient c/o central chest pain and burning. Patient per ems was Sinus Tach and gave 324 ASA PO and placed a 20 LAC PIV Per EMS, patient was lying on the floor covered in stool upon arrival.

## 2023-03-21 NOTE — H&P (Addendum)
History and Physical    Ryan Rose UJW:[id number] DOB: 1960-10-02 DOA: 03/21/2023  PCP: Sherron Monday, MD (Confirm with patient/family/NH records and if not entered, this has to be entered at [facility] point of entry) Patient coming from: Home  I have personally briefly reviewed patient's old medical records in [facility]  Chief Complaint: Nauseous vomiting chest pain abdominal pain  HPI: Ryan Rose is a 62 y.o. male with medical history significant of AAA status post repair and stenting, alcohol abuse, CAD, HTN, PVD, presented with persistent nauseous vomiting chest pain abdominal pain.  Symptoms started yesterday patient started to develop nauseous followed by frequent vomiting of stomach content multiple times associated with burning sensation behind the chest and cramping-like epigastric pain, unable to take any food or water down whole day yesterday.  Denies any diarrhea, no fever or chills.  He admitted he drinks occasionally and last drink was 2 days ago.  ED Course: Afebrile, borderline tachycardia nonhypotensive nonhypoxic.  Blood work showed K2.8, bicarb 10, anion gap 20, creatinine 0.6, AST 163 bilirubin 3.5, WBC 23  CT abdomen pelvis with contrast showed no leakage or damage of AAA repair site  Review of Systems: As per HPI otherwise 14 point review of systems negative.    Past Medical History:  Diagnosis Date   Anemia    Aortic atherosclerosis (HCC)    Arthritis    Bilateral carpal tunnel syndrome    Coronary artery disease    Diastolic dysfunction    a.) TTE 08/24/2022: EF 60-65%, mild-mod MR, G1DD.   ETOH abuse    Hepatic steatosis    History of methicillin resistant staphylococcus aureus (MRSA)    HLD (hyperlipidemia)    HTN (hypertension)    Infrarenal abdominal aortic aneurysm (AAA) without rupture (HCC) 05/28/2022   a.) CT AP 05/28/2022: saccular thrombosed; measures 4.4 x 3.2 cm   Nausea & vomiting 03/21/2023   PVD (peripheral vascular  disease) with claudication (HCC)    Sigmoid diverticulosis    Transaminitis     Past Surgical History:  Procedure Laterality Date   CERVICAL LAMINOPLASTY  08/20/2021   right C3-C6   COLONOSCOPY     ENDOVASCULAR REPAIR/STENT GRAFT N/A 09/09/2022   Procedure: ENDOVASCULAR REPAIR/STENT GRAFT;  Surgeon: Renford Dills, MD;  Location: [facility] INVASIVE CV LAB;  Service: Cardiovascular;  Laterality: N/A;   SKIN GRAFT     to hand and forearm     reports that he has been smoking cigarettes. He has a 4.5 pack-year smoking history. He has never used smokeless tobacco. He reports current alcohol use. He reports current drug use. Drug: Marijuana.  Allergies  Allergen Reactions   Sulfa Antibiotics Hives    Family History  Problem Relation Age of Onset   Heart attack Mother      Prior to Admission medications   Medication Sig Start Date End Date Taking? Authorizing Provider  acetaminophen (TYLENOL) 500 MG tablet Take one or two tablet by mouth every 6  hours as needed for pain 05/13/22  Yes   aspirin 81 MG chewable tablet Chew 81 mg by mouth daily as needed.   Yes [provider]  atorvastatin (LIPITOR) 40 MG tablet Take 1 tablet (40 mg total) by mouth daily. 08/13/22 08/08/23 Yes Agbor-Etang, Arlys John, MD  clopidogrel (PLAVIX) 75 MG tablet Take 1 tablet (75 mg total) by mouth daily. 09/10/22  Yes Pace, Brien R, NP  losartan (COZAAR) 25 MG tablet Take 1 tablet (25 mg total) by mouth daily.  08/13/22 08/08/23 Yes Agbor-Etang, Arlys John, MD  melatonin 3 MG TABS tablet Take 3 mg by mouth at bedtime.   Yes [provider]  senna-docusate (SENOKOT-S) 8.6-50 MG tablet Take 2 tablets by mouth at bedtime. For constipation.  Also available over-the-counter 05/30/22  Yes Rai, Ripudeep K, MD  traMADol (ULTRAM) 50 MG tablet Take 1 tablet (50 mg total) by mouth every 6 (six) hours as needed for up to 20 doses for moderate pain or severe pain. Patient not taking: Reported on 03/21/2023 05/30/22   Cathren Harsh, MD    Physical Exam: Vitals:   03/21/23 0852 03/21/23 0854 03/21/23 0930 03/21/23 1050  BP: 114/80 100/70 124/88 125/84  Pulse: (!) 104 99 (!) 112 (!) 102  Resp: 20 16 16 18   Temp:  97.7 F (36.5 C)    TempSrc:  Oral    SpO2: 97% 97% 96% 96%  Weight:      Height:        Constitutional: NAD, calm, comfortable Vitals:   03/21/23 0852 03/21/23 0854 03/21/23 0930 03/21/23 1050  BP: 114/80 100/70 124/88 125/84  Pulse: (!) 104 99 (!) 112 (!) 102  Resp: 20 16 16 18   Temp:  97.7 F (36.5 C)    TempSrc:  Oral    SpO2: 97% 97% 96% 96%  Weight:      Height:       Eyes: PERRL, lids and conjunctivae normal ENMT: Mucous membranes are dry. Posterior pharynx clear of any exudate or lesions.Normal dentition.  Neck: normal, supple, no masses, no thyromegaly Respiratory: clear to auscultation bilaterally, no wheezing, no crackles. Normal respiratory effort. No accessory muscle use.  Cardiovascular: Regular rate and rhythm, no murmurs / rubs / gallops. No extremity edema. 2+ pedal pulses. No carotid bruits.  Abdomen: no tenderness, no masses palpated. No hepatosplenomegaly. Bowel sounds positive.  Musculoskeletal: no clubbing / cyanosis. No joint deformity upper and lower extremities. Good ROM, no contractures. Normal muscle tone.  Skin: no rashes, lesions, ulcers. No induration Neurologic: CN 2-12 grossly intact. Sensation intact, DTR normal. Strength 5/5 in all 4.  Psychiatric: Normal judgment and insight. Alert and oriented x 3. Normal mood.     Labs on Admission: I have personally reviewed following labs and imaging studies  CBC: Recent Labs  Lab 03/21/23 0649  WBC 23.4*  NEUTROABS 21.7*  HGB 10.6*  HCT 32.7*  MCV 106.9*  PLT 297   Basic Metabolic Panel: Recent Labs  Lab 03/21/23 0649 03/21/23 0905 03/21/23 0956  NA 132*  --  135  K 2.8*  --  3.4*  CL 94*  --  100  CO2 10*  --  10*  GLUCOSE 59*  --  59*  BUN 5*  --  <5*  CREATININE 0.69  --  0.62   CALCIUM 7.6*  --  7.1*  MG  --  1.2*  --   PHOS  --   --  4.7*   GFR: Estimated Creatinine Clearance: 88.7 mL/min (by C-G formula based on SCr of 0.62 mg/dL). Liver Function Tests: Recent Labs  Lab 03/21/23 0649  AST 169*  ALT 30  ALKPHOS 207*  BILITOT 3.5*  PROT 7.4  ALBUMIN 2.8*   Recent Labs  Lab 03/21/23 0649  LIPASE 42   No results for input(s): "AMMONIA" in the last 168 hours. Coagulation Profile: Recent Labs  Lab 03/21/23 0649  INR 1.1   Cardiac Enzymes: Recent Labs  Lab 03/21/23 0649  CKTOTAL 283   BNP (last 3  results) No results for input(s): "PROBNP" in the last 8760 hours. HbA1C: No results for input(s): "HGBA1C" in the last 72 hours. CBG: No results for input(s): "GLUCAP" in the last 168 hours. Lipid Profile: No results for input(s): "CHOL", "HDL", "LDLCALC", "TRIG", "CHOLHDL", "LDLDIRECT" in the last 72 hours. Thyroid Function Tests: No results for input(s): "TSH", "T4TOTAL", "FREET4", "T3FREE", "THYROIDAB" in the last 72 hours. Anemia Panel: No results for input(s): "VITAMINB12", "FOLATE", "FERRITIN", "TIBC", "IRON", "RETICCTPCT" in the last 72 hours. Urine analysis:    Component Value Date/Time   COLORURINE YELLOW (A) 03/21/2023 0905   APPEARANCEUR HAZY (A) 03/21/2023 0905   LABSPEC 1.030 03/21/2023 0905   PHURINE 5.0 03/21/2023 0905   GLUCOSEU NEGATIVE 03/21/2023 0905   HGBUR NEGATIVE 03/21/2023 0905   BILIRUBINUR NEGATIVE 03/21/2023 0905   KETONESUR 80 (A) 03/21/2023 0905   PROTEINUR 30 (A) 03/21/2023 0905   NITRITE NEGATIVE 03/21/2023 0905   LEUKOCYTESUR NEGATIVE 03/21/2023 0905    Radiological Exams on Admission: No results found.  EKG: Independently reviewed.  Sinus rhythm, no acute ST changes Assessment/Plan Principal Problem:   Nausea & vomiting Active Problems:   Alcohol abuse   Gastritis   Bilirubinemia   Alcohol withdrawal (HCC)  (please populate well all problems here in Problem List. (For example, if patient is on  BP meds at home and you resume or decide to hold them, it is a problem that needs to be her. Same for CAD, COPD, HLD and so on)   Intractable nauseous vomiting -Clinically suspect alcohol gastritis -CT abdominal pelvis pending to rule out SBO -Supportive care, IV fluid, Zofran and PPI  Alcoholic ketoacidosis, high anion gap -Still significantly volume contraction from repeated nauseous vomiting -Continue fluid resuscitation -CIWA protocol  Severe hypokalemia -Secondary to repeated vomiting -IV and p.o. replacement, replenish magnesium  Acute on chronic transaminitis Acute bilirubinemia -Compatible with alcoholic hepatitis, calculated discriminant factor less than 32, no indication for steroid -Trend LFTs -Low suspicion for other obstructive hepatic biliary etiology, CT abdomen pelvis pending  Leukocytosis with left shift -Denies any cough, no dysuria no diarrhea.  CT chest showed no acute infiltrates, UA pending -Clinical suspect this is reactive to repeated nauseous vomiting and acidosis -Hold off antibiotics for now  Hypoglycemia -No history of DM -Check A1c -D50 x 1, fingerstick every hour x 3  Severe hypomagnesemia -Secondary to alcohol abuse -IV replacement, recheck level tomorrow  AAA with Hx of recent repair -No endoleak on CT today  Alcohol abuse -No symptoms or signs of active withdrawal -Start CIWA protocol with as needed benzos  PVD -No acute concern, continue Plavix and statin  Chronic macrocytic anemia -Check anemia panel -H&H stable  DVT prophylaxis: SCD Code Status: Full code Family Communication: None at bedside Disposition Plan: Expect less than 2 midnight hospital stay Consults called: None Admission status: Tele obs   Emeline General MD [facility] Pager [phone]  03/21/2023, 12:00 PM

## 2023-03-21 NOTE — ED Notes (Signed)
Advised nurse that patient has ready bed 

## 2023-03-21 NOTE — Plan of Care (Signed)
  Problem: Education: Goal: Knowledge of General Education information will improve Description: Including pain rating scale, medication(s)/side effects and non-pharmacologic comfort measures 03/21/2023 1800 by Sherre Scarlet, RN Outcome: Progressing 03/21/2023 1800 by Sherre Scarlet, RN Outcome: Progressing   Problem: Health Behavior/Discharge Planning: Goal: Ability to manage health-related needs will improve 03/21/2023 1800 by Sherre Scarlet, RN Outcome: Progressing 03/21/2023 1800 by Sherre Scarlet, RN Outcome: Progressing   Problem: Clinical Measurements: Goal: Ability to maintain clinical measurements within normal limits will improve 03/21/2023 1800 by Sherre Scarlet, RN Outcome: Progressing 03/21/2023 1800 by Sherre Scarlet, RN Outcome: Progressing Goal: Will remain free from infection 03/21/2023 1800 by Sherre Scarlet, RN Outcome: Progressing 03/21/2023 1800 by Sherre Scarlet, RN Outcome: Progressing Goal: Diagnostic test results will improve 03/21/2023 1800 by Sherre Scarlet, RN Outcome: Progressing 03/21/2023 1800 by Sherre Scarlet, RN Outcome: Progressing Goal: Respiratory complications will improve 03/21/2023 1800 by Sherre Scarlet, RN Outcome: Progressing 03/21/2023 1800 by Sherre Scarlet, RN Outcome: Progressing Goal: Cardiovascular complication will be avoided 03/21/2023 1800 by Sherre Scarlet, RN Outcome: Progressing 03/21/2023 1800 by Sherre Scarlet, RN Outcome: Progressing   Problem: Activity: Goal: Risk for activity intolerance will decrease 03/21/2023 1800 by Sherre Scarlet, RN Outcome: Progressing 03/21/2023 1800 by Sherre Scarlet, RN Outcome: Progressing   Problem: Nutrition: Goal: Adequate nutrition will be maintained 03/21/2023 1800 by Sherre Scarlet, RN Outcome: Progressing 03/21/2023 1800 by Sherre Scarlet, RN Outcome: Progressing   Problem: Coping: Goal: Level of anxiety will decrease 03/21/2023 1800 by Sherre Scarlet, RN Outcome:  Progressing 03/21/2023 1800 by Sherre Scarlet, RN Outcome: Progressing   Problem: Elimination: Goal: Will not experience complications related to bowel motility 03/21/2023 1800 by Sherre Scarlet, RN Outcome: Progressing 03/21/2023 1800 by Sherre Scarlet, RN Outcome: Progressing Goal: Will not experience complications related to urinary retention 03/21/2023 1800 by Sherre Scarlet, RN Outcome: Progressing 03/21/2023 1800 by Sherre Scarlet, RN Outcome: Progressing   Problem: Pain Managment: Goal: General experience of comfort will improve 03/21/2023 1800 by Sherre Scarlet, RN Outcome: Progressing 03/21/2023 1800 by Sherre Scarlet, RN Outcome: Progressing   Problem: Safety: Goal: Ability to remain free from injury will improve 03/21/2023 1800 by Sherre Scarlet, RN Outcome: Progressing 03/21/2023 1800 by Sherre Scarlet, RN Outcome: Progressing   Problem: Skin Integrity: Goal: Risk for impaired skin integrity will decrease 03/21/2023 1800 by Sherre Scarlet, RN Outcome: Progressing 03/21/2023 1800 by Sherre Scarlet, RN Outcome: Progressing

## 2023-03-21 NOTE — Progress Notes (Addendum)
ED nurse reported the patient had coffee-ground like vomiting x 2, vital signs remained borderline tachycardia but blood pressure stable.  Will check H&H continue PPI IV twice daily. No Hx of cirrhosis and INR normal, will hold off initiating Sandostatin drip. Will hold off Plavix.  Escalate to PCU  Repeat Hb=8.6 compared to 10.6 this morning and iron study within normal limit. Secure text GI, start NPO. Repeat H/H in 6 HR.

## 2023-03-22 DIAGNOSIS — E8729 Other acidosis: Secondary | ICD-10-CM

## 2023-03-22 DIAGNOSIS — F10930 Alcohol use, unspecified with withdrawal, uncomplicated: Secondary | ICD-10-CM | POA: Diagnosis not present

## 2023-03-22 DIAGNOSIS — K922 Gastrointestinal hemorrhage, unspecified: Secondary | ICD-10-CM | POA: Diagnosis not present

## 2023-03-22 DIAGNOSIS — D72829 Elevated white blood cell count, unspecified: Secondary | ICD-10-CM | POA: Diagnosis present

## 2023-03-22 DIAGNOSIS — K920 Hematemesis: Secondary | ICD-10-CM

## 2023-03-22 DIAGNOSIS — E876 Hypokalemia: Secondary | ICD-10-CM | POA: Diagnosis present

## 2023-03-22 DIAGNOSIS — I739 Peripheral vascular disease, unspecified: Secondary | ICD-10-CM | POA: Diagnosis present

## 2023-03-22 HISTORY — DX: Other acidosis: E87.29

## 2023-03-22 LAB — GLUCOSE, CAPILLARY
Glucose-Capillary: 182 mg/dL — ABNORMAL HIGH (ref 70–99)
Glucose-Capillary: 357 mg/dL — ABNORMAL HIGH (ref 70–99)

## 2023-03-22 LAB — HEPATITIS PANEL, ACUTE
HCV Ab: NONREACTIVE
Hep A IgM: NONREACTIVE
Hep B C IgM: NONREACTIVE
Hepatitis B Surface Ag: NONREACTIVE

## 2023-03-22 MED ORDER — SUCRALFATE 1 GM/10ML PO SUSP
1.0000 g | Freq: Three times a day (TID) | ORAL | Status: DC
  Administered 2023-03-22 – 2023-04-17 (×41): 1 g via ORAL
  Filled 2023-03-22 (×77): qty 10

## 2023-03-22 MED ORDER — POTASSIUM CHLORIDE 10 MEQ/100ML IV SOLN
10.0000 meq | INTRAVENOUS | Status: AC
  Administered 2023-03-22 (×3): 10 meq via INTRAVENOUS
  Filled 2023-03-22 (×3): qty 100

## 2023-03-22 NOTE — Assessment & Plan Note (Signed)
No endoleak on CTA on admission. Monitor.

## 2023-03-22 NOTE — Assessment & Plan Note (Addendum)
Patient made n.p.o. for MRCP

## 2023-03-22 NOTE — Assessment & Plan Note (Addendum)
Initial K 2.8 on admission, replaced. K 3.4 this AM, further replacement underway --Monitor BMP, Mg levels, Phos level

## 2023-03-22 NOTE — Assessment & Plan Note (Deleted)
Mental status seems improved today as per nursing staff.

## 2023-03-22 NOTE — Assessment & Plan Note (Addendum)
Hold Plavix.  Continue atorvastatin.

## 2023-03-22 NOTE — Hospital Course (Addendum)
Ryan Rose is a 62 y.o. male with medical history significant of AAA status post repair and stenting, alcohol abuse, CAD, HTN, PVD, presented with persistent nauseous vomiting chest pain abdominal pain x1 day. He admitted he drinks occasionally and last drink was 2 days ago.  Significant hospital events:  08/12: admitted to hospitalist service. Aortic dissection r/o, CTA (+)other concerns w/ esophageal thickening, chronic diverticulitis, severe diffuse hepatic steatosis.  08/15: Patient had upper endoscopy done which showed grade D esophagitis, large hiatal hernia and single nonbleeding angiectasia in the stomach treated with argon plasma. 08/16: Patient's hemoglobin dipped down to 6.5. Transfused 1 unit PRBC 08/18: Chest x-ray possible aspiration pneumonia.  Started on Unasyn. 08/20: Fever, hypotensive, tachycardia.  Patient given sepsis fluid bolus.  Antibiotics changed over to Maxipime and Flagyl.  MRCP ordered.  In the afternoon and blood pressure dropped down into the 70s and not taking midodrine, transfer to the stepdown for closer monitoring. 08/23: cholecystostomy tube placement  Since then and to today 04/16/23 - difficult placement, concern for dementia and behavioral difficulty d/t this. Intermittent urinary retention, avoiding Foley d/t confusion and pt has removed IV lines etc. Has not been eating much, not good candidate for feeding tube, palliative care following peripherally.     Consultants:  Gastroenterology  [facility] General Surgery Palliative Care Interventional Radiology   Procedures:  Upper endoscopy 08/15 which showed grade D esophagitis, large hiatal hernia and single nonbleeding angiectasia in the stomach treated with argon plasma.   MRCP 08/20--Severe diffuse fatty infiltration of the liver. No focal hepatic lesions are identified without contrast. 2. Distended gallbladder with layering sludge. No definite gallstones. Gallbladder wall thickening and  pericholecystic inflammatory changes could suggest acalculus cholecystitis or cholestasis. Normal caliber and course of the common bile duct. 3. Small bilateral pleural effusions with overlying atelectasis. There is also a cystic appearing fluid collection near the distal esophagus on the right side. This was not present on the prior CT scan and could be loculated pleural fluid. 4. Moderate mesenteric edema and small volume ascites.  Cholecystotomy tube placement 08/23         ASSESSMENT & PLAN:   Principal Problem:   Severe sepsis (HCC) Active Problems:   Acute blood loss anemia   Alcohol withdrawal delirium (HCC)   Acute upper GI bleed   Aspiration pneumonia (HCC)   Alcohol withdrawal (HCC)   Nausea & vomiting   Hypophosphatemia   Hypomagnesemia   Hypokalemia   Alcoholic ketoacidosis   PVD (peripheral vascular disease) (HCC)   Transaminitis   Macrocytic anemia   Abdominal aortic aneurysm (AAA) (HCC)   Leukocytosis   Alcohol abuse   Bilirubinemia   Hyponatremia   Arterial hypotension   Acute acalculous cholecystitis   Malnutrition of moderate degree   Severe sepsis (HCC) Acalculus cholecystitis S/p drain  MRCP as above Gen surgery consulted 8/22-- HIDA scan abnormal. Discussed with general surgery. Patient is not a surgical candidate. Recommended gallbladder drain placement.  received 2 days of Unasyn f/b 6 days of cefepime and flagyl.  Transitioned to Augmentin when pt lost IV.  Pt completed 10-day course of abx. cont drain management   Alcohol withdrawal delirium  Dementia suspect alcohol related Received 2 days of high-dose thiamine.   Right now patient does not have capacity make medical decisions.  confused at baseline however is redirected well.   Acute blood loss anemia Macrocytic anemia s/p 2u PRBC monitor Hgb and transfuse to keep Hgb >7   Acute upper GI bleed  EGD on 8/15 (+)grade D esophagitis, hiatal hernia and angiectasia which was  treated with argon plasma coagulation.  cont PPI   Aspiration pneumonia - resolved  on abx, treated    Hypophosphatemia  Hypomagnesemia Hypokalemia monitor and supplement PRN   Alcoholic ketoacidosis Resolved   PVD (peripheral vascular disease)  hold plavix given GI bleed  cont ASA and statin   Transaminitis Secondary to alcohol abuse.   Alkaline phosphatase 205, total bilirubin of 5.1, AST 117, albumin low at 1.6 Monitor periodic CMP    Abdominal aortic aneurysm  No endoleak on CTA on admission. Follow outpatient    Hyponatremia Resolved Monitor BMP   Leukocytosis persistent.  Procal however decreased from 1.64 to 0.73 with abx. Monitor CBC   Poor oral intake refusing all Ensures and eating very little even while staff trying to feed him.  Refusing meds sometimes. started appetite stimulant Marinol, per sister request   Acute urinary retention, intermittent I/O cath removed ~600 ml urine, x2. cont bladder scan q6h, if post-void >500 ml, then I/O cath.  Try to avoid Foley placement since there is a high risk of pt pulling it out. cont Flomax (new) try to position pt upright to help pt void   Current smoker pt persistently asked to go smoke Nicotine patch    DVT prophylaxis: lovenox IV fluids: no continuous IV fluids  Nutrition: regular diet, he has poor po intake  Central lines / invasive devices: cholecystostomy tube   Code Status: DNR ACP documentation reviewed: 04/15/23 MOST form is on file in [facility]  Current Admission Status: inpatient, Med/Surg  LOS: 26 TOC needs: placement Barriers to discharge / significant pending items: placement

## 2023-03-22 NOTE — Assessment & Plan Note (Addendum)
MCV down to 96.8

## 2023-03-22 NOTE — Assessment & Plan Note (Addendum)
with left shift.  Pt denies cough, dysuria, diarrhea.  CT chest showed no acute infiltrates, UA negative. Suspect this is reactive to repeated nauseous vomiting and acidosis --Hold off antibiotics for now --Monitor closely for s/sx's of infection

## 2023-03-22 NOTE — Assessment & Plan Note (Addendum)
EGD on 8/15 shows grade D esophagitis, hiatal hernia and angiectasia which was treated with argon plasma coagulation.

## 2023-03-22 NOTE — Consult Note (Signed)
Arlyss Repress, MD [address]  [address]  [place], [place] [address]  Main: [phone]  Fax: [phone] Pager: [phone]   Consultation  Referring Provider:     No ref. provider found Primary Care Physician:  Sherron Monday, MD Primary Gastroenterologist: Gentry Fitz         Reason for Consultation:     Nausea and vomiting, coffee-ground emesis  Date of Admission:  03/21/2023 Date of Consultation:  03/22/2023         HPI:   Ryan Rose is a 62 y.o. male with history of COPD, alcohol use presented with burning chest pain, nausea and vomiting followed by coffee-ground emesis.  History of AAA repair, on aspirin and Plavix.  He also reports epigastric pain.  He was tachycardic in the ER, with leukocytosis, abnormal LFTs alk phos 125, AST 133, ALT 24, T. bili 3.4, hypokalemia, acidosis, hemoglobin 7.8, MCV 103, hemoglobin dropped from 10.6 on presentation.  Baseline hemoglobin is between 10 and 12.  Low normal platelets.  Normal BUN/creatinine.  Patient denies any epigastric pain, reports nausea is better.  He underwent CT chest abdomen pelvis to rule out aortic dissection and findings as below  3. Prominent diffuse circumferential wall thickening in the thoracic esophagus with associated haziness of the surrounding mesenteric fat, suggesting nonspecific esophagitis, with esophageal neoplasm not excluded. GI consultation advised for consideration of upper endoscopic correlation. 4. Marked sigmoid diverticulosis with a dominant large posterior sigmoid diverticulum with associated prominent wall thickening and mild pericolonic fat stranding, similar in appearance to 05/28/2022 CT, suggesting acute on chronic diverticulitis. No free air. No abscess. Please ensure patient is up-to-date with colon screening to exclude underlying colonic neoplasm. 5. Severe diffuse hepatic steatosis. 6. Three-vessel coronary atherosclerosis. 7. Moderate centrilobular and paraseptal  emphysema with diffuse bronchial wall thickening, suggesting COPD.  NSAIDs: Unknown  Antiplts/Anticoagulants/Anti thrombotics: Aspirin and Plavix  GI Procedures: Unknown  Past Medical History:  Diagnosis Date   Anemia    Aortic atherosclerosis (HCC)    Arthritis    Bilateral carpal tunnel syndrome    Coronary artery disease    Diastolic dysfunction    a.) TTE 08/24/2022: EF 60-65%, mild-mod MR, G1DD.   ETOH abuse    Hepatic steatosis    History of methicillin resistant staphylococcus aureus (MRSA)    HLD (hyperlipidemia)    HTN (hypertension)    Infrarenal abdominal aortic aneurysm (AAA) without rupture (HCC) 05/28/2022   a.) CT AP 05/28/2022: saccular thrombosed; measures 4.4 x 3.2 cm   Nausea & vomiting 03/21/2023   PVD (peripheral vascular disease) with claudication (HCC)    Sigmoid diverticulosis    Transaminitis     Past Surgical History:  Procedure Laterality Date   CERVICAL LAMINOPLASTY  08/20/2021   right C3-C6   COLONOSCOPY     ENDOVASCULAR REPAIR/STENT GRAFT N/A 09/09/2022   Procedure: ENDOVASCULAR REPAIR/STENT GRAFT;  Surgeon: Renford Dills, MD;  Location: [facility] INVASIVE CV LAB;  Service: Cardiovascular;  Laterality: N/A;   SKIN GRAFT     to hand and forearm     Current Facility-Administered Medications:    acetaminophen (TYLENOL) tablet 650 mg, 650 mg, Oral, Q6H PRN **OR** acetaminophen (TYLENOL) suppository 650 mg, 650 mg, Rectal, Q6H PRN, Mikey College T, MD   aspirin chewable tablet 81 mg, 81 mg, Oral, Daily PRN, Mikey College T, MD   atorvastatin (LIPITOR) tablet 40 mg, 40 mg, Oral, Daily, Mikey College T, MD, 40 mg at 03/22/23 0915   dextrose  5 %-0.9 % sodium chloride infusion, , Intravenous, Continuous, Emeline General, MD, Stopped at 03/22/23 1025   folic acid (FOLVITE) tablet 1 mg, 1 mg, Oral, Daily, Chipper Herb, Ping T, MD, 1 mg at 03/22/23 0916   LORazepam (ATIVAN) tablet 1-4 mg, 1-4 mg, Oral, Q1H PRN **OR** LORazepam (ATIVAN) injection 1-4 mg, 1-4 mg,  Intravenous, Q1H PRN, Mikey College T, MD   LORazepam (ATIVAN) tablet 2 mg, 2 mg, Oral, BID, Mikey College T, MD, 2 mg at 03/22/23 0916   melatonin tablet 2.5 mg, 2.5 mg, Oral, QHS, Mikey College T, MD   multivitamin with minerals tablet 1 tablet, 1 tablet, Oral, Daily, Mikey College T, MD, 1 tablet at 03/22/23 0916   ondansetron (ZOFRAN) injection 4 mg, 4 mg, Intravenous, Q6H PRN, Mikey College T, MD, 4 mg at 03/22/23 0105   Oral care mouth rinse, 15 mL, Mouth Rinse, PRN, Mikey College T, MD   pantoprazole (PROTONIX) injection 40 mg, 40 mg, Intravenous, Q12H, Mikey College T, MD, 40 mg at 03/22/23 0918   phenol (CHLORASEPTIC) mouth spray 1 spray, 1 spray, Mouth/Throat, PRN, Marrion Coy, MD   pneumococcal 20-valent conjugate vaccine (PREVNAR 20) injection 0.5 mL, 0.5 mL, Intramuscular, Tomorrow-1000, Zhang, Dekui, MD   potassium chloride 10 mEq in 100 mL IVPB, 10 mEq, Intravenous, Q1 Hr x 3, Griffith, Kelly A, DO, Last Rate: 100 mL/hr at 03/22/23 1050, Infusion Verify at 03/22/23 1050   senna-docusate (Senokot-S) tablet 2 tablet, 2 tablet, Oral, QHS, Zhang, Ping T, MD   sucralfate (CARAFATE) 1 GM/10ML suspension 1 g, 1 g, Oral, TID WC & HS, , Loel Dubonnet, MD, 1 g at 03/22/23 0915   thiamine (VITAMIN B1) tablet 100 mg, 100 mg, Oral, Daily, 100 mg at 03/22/23 0916 **OR** thiamine (VITAMIN B1) injection 100 mg, 100 mg, Intravenous, Daily, Mikey College T, MD   Family History  Problem Relation Age of Onset   Heart attack Mother      Social History   Tobacco Use   Smoking status: Some Days    Current packs/day: 0.10    Average packs/day: 0.1 packs/day for 45.0 years (4.5 ttl pk-yrs)    Types: Cigarettes   Smokeless tobacco: Never   Tobacco comments:    Smokes 5 cigarettes weekly  Vaping Use   Vaping status: Never Used  Substance Use Topics   Alcohol use: Yes    Comment: occasional   Drug use: Yes    Types: Marijuana    Comment: occasionally    Allergies as of 03/21/2023 - Review Complete  03/21/2023  Allergen Reaction Noted   Sulfa antibiotics Hives 11/25/2014    Review of Systems:    All systems reviewed and negative except where noted in HPI.   Physical Exam:  Vital signs in last 24 hours: Temp:  [97.6 F (36.4 C)-99.4 F (37.4 C)] 99.4 F (37.4 C) (08/12 0312) Pulse Rate:  [99-112] 110 (08/12 0312) Resp:  [16-22] 20 (08/12 0312) BP: (96-131)/(67-100) 130/80 (08/12 0312) SpO2:  [95 %-100 %] 100 % (08/12 0312) Last BM Date : 03/20/23 General:   Pleasant, cooperative in NAD Head:  Normocephalic and atraumatic. Eyes:   No icterus.   Conjunctiva pink. PERRLA. Ears:  Normal auditory acuity. Neck:  Supple; no masses or thyroidomegaly Lungs: Respirations even and unlabored. Lungs clear to auscultation bilaterally.   No wheezes, crackles, or rhonchi.  Heart:  Regular rate and rhythm;  Without murmur, clicks, rubs or gallops Abdomen:  Soft, nondistended, nontender. Normal bowel sounds. No appreciable masses  or hepatomegaly.  No rebound or guarding.  Rectal:  Not performed. Msk:  Symmetrical without gross deformities.  Strength generalized weakness Extremities:  Without edema, cyanosis or clubbing. Neurologic:  Alert and oriented x3;  grossly normal neurologically. Skin:  Intact without significant lesions or rashes. Psych:  Alert and cooperative. Normal affect.  LAB RESULTS:    Latest Ref Rng & Units 03/22/2023    4:06 AM 03/21/2023    8:07 PM 03/21/2023    3:23 PM  CBC  WBC 4.0 - 10.5 K/uL 10.3     Hemoglobin 13.0 - 17.0 g/dL 7.8  8.3  8.6   Hematocrit 39.0 - 52.0 % 23.7  24.4  26.8   Platelets 150 - 400 K/uL 169       BMET    Latest Ref Rng & Units 03/22/2023    4:06 AM 03/21/2023    4:13 PM 03/21/2023    9:56 AM  BMP  Glucose 70 - 99 mg/dL 161  096  59   BUN 8 - 23 mg/dL <5  <5  <5   Creatinine 0.61 - 1.24 mg/dL 0.45  4.09  8.11   Sodium 135 - 145 mmol/L 134  135  135   Potassium 3.5 - 5.1 mmol/L 3.4  3.5  3.4   Chloride 98 - 111 mmol/L 104  101  100    CO2 22 - 32 mmol/L 16  10  10    Calcium 8.9 - 10.3 mg/dL 7.4  7.3  7.1     LFT    Latest Ref Rng & Units 03/22/2023    4:06 AM 03/21/2023    6:49 AM 02/12/2023    4:59 AM  Hepatic Function  Total Protein 6.5 - 8.1 g/dL 6.0  7.4  6.8   Albumin 3.5 - 5.0 g/dL 2.1  2.8  3.1   AST 15 - 41 U/L 133  169  150   ALT 0 - 44 U/L 24  30  29    Alk Phosphatase 38 - 126 U/L 145  207  222   Total Bilirubin 0.3 - 1.2 mg/dL 3.4  3.5  2.2      STUDIES: DG Wrist 2 Views Left  Result Date: 03/21/2023 CLINICAL DATA:  Wrist pain, no injury EXAM: LEFT WRIST - 2 VIEW COMPARISON:  None Available. FINDINGS: No fracture or dislocation is seen. Degenerative changes with radiocarpal narrowing. The visualized soft tissues are unremarkable. IMPRESSION: No fracture or dislocation is seen. Degenerative changes, as above. Electronically Signed   By: Charline Bills M.D.   On: 03/21/2023 20:34   DG Elbow 2 Views Left  Result Date: 03/21/2023 CLINICAL DATA:  [id number] Arm pain [id number] EXAM: LEFT ELBOW - 2 VIEW COMPARISON:  None Available. FINDINGS: No cortical erosion or destruction. There is no evidence of fracture, dislocation, or joint effusion. There is no evidence of arthropathy or other focal bone abnormality. Partially visualized diffuse anterior subcutaneus soft tissue edema and emphysema. IMPRESSION: 1. No acute displaced fracture or dislocation. 2. No radiographic findings suggest osteomyelitis. 3. Partially visualized diffuse anterior subcutaneus soft tissue edema and emphysema. Electronically Signed   By: Tish Frederickson M.D.   On: 03/21/2023 19:37   CT Angio Chest/Abd/Pel for Dissection W and/or Wo Contrast  Result Date: 03/21/2023 CLINICAL DATA:  Chest pain, nausea and vomiting. Acute aortic syndrome (AAS) suspected EXAM: CT ANGIOGRAPHY CHEST, ABDOMEN AND PELVIS TECHNIQUE: Non-contrast CT of the chest was initially obtained. Multidetector CT imaging through the chest, abdomen and pelvis was performed using  the  standard protocol during bolus administration of intravenous contrast. Multiplanar reconstructed images and MIPs were obtained and reviewed to evaluate the vascular anatomy. RADIATION DOSE REDUCTION: This exam was performed according to the departmental dose-optimization program which includes automated exposure control, adjustment of the mA and/or kV according to patient size and/or use of iterative reconstruction technique. CONTRAST:  80mL OMNIPAQUE IOHEXOL 350 MG/ML SOLN COMPARISON:  05/28/2022 CT abdomen/pelvis. FINDINGS: CTA CHEST FINDINGS Cardiovascular: Normal heart size. No significant pericardial effusion/thickening. Three-vessel coronary atherosclerosis. Atherosclerotic nonaneurysmal thoracic aorta. No evidence of thoracic aortic dissection, intramural hematoma, pseudoaneurysm or penetrating atherosclerotic ulcer. Normal caliber pulmonary arteries. No central pulmonary emboli. Mediastinum/Nodes: No significant thyroid nodules. Prominent diffuse circumferential wall thickening in the thoracic esophagus with associated haziness of the surrounding mesenteric fat. No pathologically enlarged axillary, mediastinal or hilar lymph nodes. Lungs/Pleura: No pneumothorax. No pleural effusion. Moderate centrilobular and paraseptal emphysema with diffuse bronchial wall thickening. A few scattered tiny solid pulmonary nodules in the peripheral upper lobes bilaterally, largest 0.2 cm anteriorly in the right upper lobe (series 7/image 34). No acute consolidative airspace disease, lung masses or additional significant pulmonary nodules. Several scattered curvilinear parenchymal bands throughout the mid to lower lungs bilaterally. Musculoskeletal: No aggressive appearing focal osseous lesions. Moderate thoracic spondylosis. Review of the MIP images confirms the above findings. CTA ABDOMEN AND PELVIS FINDINGS VASCULAR Aorta: Atherosclerotic abdominal aorta with 5.8 x 3.9 cm infrarenal left eccentric saccular abdominal aortic  aneurysm status post aorto bi-iliac graft repair with no evidence of graft complication. No abdominal aortic dissection or penetrating atherosclerotic ulcer. Celiac: Patent without evidence of aneurysm, dissection, vasculitis or significant stenosis. SMA: Patent without evidence of aneurysm, dissection, vasculitis or significant stenosis. Renals: Both renal arteries are patent without evidence of aneurysm, dissection, vasculitis, fibromuscular dysplasia or significant stenosis. IMA: Occluded. Inflow: Heavily atherosclerotic bilateral common and external iliac arteries particularly in the right external iliac artery, without high-grade stenoses. Veins: No obvious venous abnormality within the limitations of this arterial phase study. Review of the MIP images confirms the above findings. NON-VASCULAR Hepatobiliary: Severe diffuse hepatic steatosis. No definite liver surface irregularity. No liver masses. Normal gallbladder with no radiopaque cholelithiasis. No biliary ductal dilatation. Pancreas: Normal, with no mass or duct dilation. Spleen: Normal size. No mass. Adrenals/Urinary Tract: Normal adrenals. No hydronephrosis. No contour deforming renal masses. Nonspecific symmetric bilateral perinephric fat stranding and ill-defined fluid. Distended and otherwise normal bladder. Stomach/Bowel: Normal non-distended stomach. Normal caliber small bowel with no small bowel wall thickening. Normal appendix. Marked sigmoid diverticulosis with a dominant large posterior sigmoid diverticulum with associated prominent wall thickening and mild pericolonic fat stranding, similar in appearance to 05/28/2022 CT. Vascular/Lymphatic: No pathologically enlarged lymph nodes in the abdomen or pelvis. Reproductive: Normal size prostate. Other: No pneumoperitoneum, ascites or focal fluid collection. Musculoskeletal: No aggressive appearing focal osseous lesions. Mild lumbar spondylosis. Review of the MIP images confirms the above findings.  IMPRESSION: 1. No acute aortic syndrome. 2. Infrarenal 5.8 cm saccular abdominal aortic aneurysm status post aorto bi-iliac graft repair with no evidence of complication. 3. Prominent diffuse circumferential wall thickening in the thoracic esophagus with associated haziness of the surrounding mesenteric fat, suggesting nonspecific esophagitis, with esophageal neoplasm not excluded. GI consultation advised for consideration of upper endoscopic correlation. 4. Marked sigmoid diverticulosis with a dominant large posterior sigmoid diverticulum with associated prominent wall thickening and mild pericolonic fat stranding, similar in appearance to 05/28/2022 CT, suggesting acute on chronic diverticulitis. No free air. No abscess. Please ensure patient is up-to-date  with colon screening to exclude underlying colonic neoplasm. 5. Severe diffuse hepatic steatosis. 6. Three-vessel coronary atherosclerosis. 7. Moderate centrilobular and paraseptal emphysema with diffuse bronchial wall thickening, suggesting COPD. 8. Tiny scattered solid pulmonary nodules in the upper lobes bilaterally, largest 0.2 cm. Per Fleischner Society Guidelines, a non-contrast Chest CT at 12 months is optional. If performed and the nodule is stable at 12 months, no further follow-up is recommended. These guidelines do not apply to immunocompromised patients and patients with cancer. Follow up in patients with significant comorbidities as clinically warranted. For lung cancer screening, adhere to Lung-RADS guidelines. Reference: Radiology. 2017; 284(1):228-43. 9. Aortic Atherosclerosis (ICD10-I70.0) and Emphysema (ICD10-J43.9). Electronically Signed   By: Delbert Phenix M.D.   On: 03/21/2023 08:40      Impression / Plan:   Ryan Rose is a 62 y.o. male with history of AAA, status post endovascular repair on aspirin and Plavix, chronic alcohol use, hypertension presented with nausea, vomiting, burning chest pain, coffee-ground emesis, acute on  chronic anemia.  Patient has chronic macrocytic anemia secondary to alcohol use  Coffee-ground emesis with acute on chronic anemia Patient has been on aspirin and Plavix as outpatient.  Plavix is held during this admission Continue Protonix 40 mg IV twice daily Recommend Carafate suspension 3 to 4 hours daily Antiemetics Monitor CBC closely to maintain hemoglobin above 8 Iron studies not consistent with iron deficiency anemia, likely has a component of anemia of chronic disease and bone marrow suppression from alcohol abuse Normal B12 and folate levels Patient will need EGD after discontinuation of Plavix for at least 3 days, ideally 5 to 7 days in order to perform any therapeutic intervention of the bleeding source Okay to start liquid diet and advance as tolerated  Chronically elevated LFTs, AST more than twice ALT secondary to chronic alcohol use, alcoholic liver disease No evidence of cirrhosis of liver based on ultrasound from 02/2023 Severe diffuse hepatic steatosis based on imaging, no evidence of portal hypertension or splenomegaly Check viral hepatitis panel Continue multivitamin, thiamine and folate daily Abstinence from alcohol use Recommend to start gabapentin 300 to 600 mg twice daily to prevent alcohol withdrawal  Thank you for involving me in the care of this patient.      LOS: 1 day   Lannette Donath, MD  03/22/2023, 7:38 AM    Note: This dictation was prepared with Dragon dictation along with smaller phrase technology. Any transcriptional errors that result from this process are unintentional.

## 2023-03-22 NOTE — Progress Notes (Signed)
Progress Note   Patient: Ryan Rose XBJ:[id number] DOB: 1960/08/25 DOA: 03/21/2023     1 DOS: the patient was seen and examined on 03/22/2023    Brief hospital course:  HPI on admission 03/22/23 by Dr. Chipper Herb: "Ryan Rose is a 62 y.o. male with medical history significant of AAA status post repair and stenting, alcohol abuse, CAD, HTN, PVD, presented with persistent nauseous vomiting chest pain abdominal pain.   Symptoms started yesterday patient started to develop nauseous followed by frequent vomiting of stomach content multiple times associated with burning sensation behind the chest and cramping-like epigastric pain, unable to take any food or water down whole day yesterday.  Denies any diarrhea, no fever or chills.  He admitted he drinks occasionally and last drink was 2 days ago.   ED Course: Afebrile, borderline tachycardia nonhypotensive nonhypoxic.  Blood work showed K2.8, bicarb 10, anion gap 20, creatinine 0.6, AST 163 bilirubin 3.5, WBC 23   CT abdomen pelvis with contrast showed no leakage or damage of AAA repair site"  Later afternoon of admission, pt had coffee-ground emesis x 2, with Hbg 10.6 >> 8.6.   GI was consulted.   Assessment and Plan:  * Nausea & vomiting Clinically suspect this is due alcohol gastritis.  CTA chest/abdominal/pelvis on admission showed changes consistent with esophagitis (neoplasm not excluded)... extensive other findings unrelated to N/V - see report. --continue IV fluid --PRN Zofran  --IV PPI BID --GI consulted given coffee-ground emesis  PVD (peripheral vascular disease) (HCC) Continue Plavix, statin  Hypomagnesemia Initial Mg 1.2.  Replaced on admission. Mg 1.8 this AM --Monitor Mg and replace PRN   Leukocytosis with left shift.  Pt denies cough, dysuria, diarrhea.  CT chest showed no acute infiltrates, UA negative. Suspect this is reactive to repeated nauseous vomiting and acidosis --Hold off antibiotics for  now --Monitor closely for s/sx's of infection  Alcoholic ketoacidosis Improved.  Presented with significant volume contraction from repeated nauseous vomiting. --Continue fluid resuscitation --Monitor labs  Hypokalemia Initial K 2.8 on admission, replaced. K 3.4 this AM, further replacement underway --Monitor BMP, Mg levels, Phos level  GI bleed Acute blood loss anemia Pt had coffee-ground emesis x 2 in ED day of admission, with 2+ pt Hbg decline. --GI consulted --Continue IV PPI BID --Clear liquids for now --Diet per GI --Trend Hbg and transfuse if < 7 or if <8 with active bleeding signs  Alcohol withdrawal (HCC) --CIWA protocol w/ PRN Ativan   Bilirubinemia .  Gastritis See nausea/vomiting GI following  Abdominal aortic aneurysm (AAA) (HCC) No endoleak on CTA on admission. Monitor.  Macrocytic anemia Chronic.  Now with acute blood loss anemia likely from upper GI bleed given coffee-ground emesis. --Monitor Hbg closely  Transaminitis Acute on chronic.  Likely due to alcoholic hepatitis.  MDF < 32 on admission, no steroids indicated. Acute hyperbilirubinemia - possibly due to upper GI bleeding. --Monitor LFT's --GI following  Alcohol abuse .        Subjective: Pt seen awake resting in bed this AM.  He erports N/V has eased up some since yesterday.  Denies feeling tremulous, sweaty or other symptoms of alcohol withdrawal.   Physical Exam: Vitals:   03/22/23 0012 03/22/23 0312 03/22/23 0758 03/22/23 1208  BP: 131/85 130/80 103/66 112/71  Pulse:  (!) 110 (!) 113 (!) 116  Resp: 20 20 17 17   Temp: 98.7 F (37.1 C) 99.4 F (37.4 C) 99.5 F (37.5 C) 98.5 F (36.9 C)  TempSrc: Oral Oral  Oral  SpO2: 97% 100% 96% 95%  Weight:      Height:       General exam: awake, alert, no acute distress, chronically ill appearing HEENT: moist mucus membranes, hearing grossly normal  Respiratory system: CTAB, no wheezes, rales or rhonchi, normal respiratory  effort. Cardiovascular system: normal S1/S2, RRR, no JVD, murmurs, rubs, gallops, no pedal edema.   Gastrointestinal system: soft, NT, ND, no HSM felt, +bowel sounds. Central nervous system: A&O x 3. no gross focal neurologic deficits, normal speech Extremities: moves all, no edema, normal tone Skin: dry, intact, normal temperature Psychiatry: normal mood, congruent affect   Data Reviewed:  Notable labs --- Na 134, K 3.4, bicarb 10 >> 16 improving, glucose 184, BUN < 5, Cr 0.55, Ca 7.4, albumin 2.1, AST 133, normal ALT 24, Tprotein 6.0, T bili 3.4,  Hbg trend: 10.6 >> 8.6 >> 8.3 >> 7.8  Family Communication: None present.   Disposition: Status is: Inpatient Remains inpatient appropriate because: Ongoing evaluation and requiring IV therapies   Planned Discharge Destination: Home    Time spent: 46 minutes  Author: Pennie Banter, DO 03/22/2023 4:32 PM  For on call review [email or web address].

## 2023-03-22 NOTE — Plan of Care (Signed)

## 2023-03-22 NOTE — Assessment & Plan Note (Deleted)
See nausea/vomiting GI following

## 2023-03-22 NOTE — Assessment & Plan Note (Addendum)
Resolved.  Presented with significant volume contraction from repeated nauseous vomiting. --Off IV fluids resuscitation --Monitor labs

## 2023-03-22 NOTE — Assessment & Plan Note (Addendum)
Secondary to alcohol abuse.  Last AST 141, last total bilirubin 4.5 last alkaline phosphatase 185 last albumin 1.9.

## 2023-03-22 NOTE — Assessment & Plan Note (Addendum)
Initial Mg 1.2.  Replaced on admission. Mg 1.8 this AM --Monitor Mg and replace PRN

## 2023-03-23 DIAGNOSIS — R112 Nausea with vomiting, unspecified: Secondary | ICD-10-CM | POA: Diagnosis not present

## 2023-03-23 DIAGNOSIS — K920 Hematemesis: Secondary | ICD-10-CM | POA: Diagnosis not present

## 2023-03-23 DIAGNOSIS — F10930 Alcohol use, unspecified with withdrawal, uncomplicated: Secondary | ICD-10-CM | POA: Diagnosis not present

## 2023-03-23 MED ORDER — K PHOS MONO-SOD PHOS DI & MONO 155-852-130 MG PO TABS
500.0000 mg | ORAL_TABLET | Freq: Two times a day (BID) | ORAL | Status: AC
  Administered 2023-03-24 – 2023-03-25 (×2): 500 mg via ORAL
  Filled 2023-03-23 (×5): qty 2

## 2023-03-23 MED ORDER — MAGNESIUM OXIDE -MG SUPPLEMENT 400 (240 MG) MG PO TABS
400.0000 mg | ORAL_TABLET | Freq: Two times a day (BID) | ORAL | Status: AC
  Filled 2023-03-23 (×2): qty 1

## 2023-03-23 MED ORDER — POTASSIUM PHOSPHATES 15 MMOLE/5ML IV SOLN
30.0000 mmol | Freq: Once | INTRAVENOUS | Status: AC
  Administered 2023-03-23: 30 mmol via INTRAVENOUS
  Filled 2023-03-23: qty 10

## 2023-03-23 MED ORDER — CHLORDIAZEPOXIDE HCL 25 MG PO CAPS
25.0000 mg | ORAL_CAPSULE | Freq: Three times a day (TID) | ORAL | Status: DC
  Administered 2023-03-24 – 2023-03-25 (×3): 25 mg via ORAL
  Filled 2023-03-23 (×4): qty 1

## 2023-03-23 MED ORDER — MAGNESIUM SULFATE 4 GM/100ML IV SOLN
4.0000 g | Freq: Once | INTRAVENOUS | Status: AC
  Administered 2023-03-23: 4 g via INTRAVENOUS
  Filled 2023-03-23: qty 100

## 2023-03-23 NOTE — Progress Notes (Addendum)
Progress Note   Patient: Ryan Rose:[id number] DOB: 1961-03-18 DOA: 03/21/2023     2 DOS: the patient was seen and examined on 03/23/2023    Brief hospital course:  HPI on admission 03/22/23 by Dr. Chipper Herb: "Ryan Rose is a 62 y.o. male with medical history significant of AAA status post repair and stenting, alcohol abuse, CAD, HTN, PVD, presented with persistent nauseous vomiting chest pain abdominal pain.   Symptoms started yesterday patient started to develop nauseous followed by frequent vomiting of stomach content multiple times associated with burning sensation behind the chest and cramping-like epigastric pain, unable to take any food or water down whole day yesterday.  Denies any diarrhea, no fever or chills.  He admitted he drinks occasionally and last drink was 2 days ago.   ED Course: Afebrile, borderline tachycardia nonhypotensive nonhypoxic.  Blood work showed K2.8, bicarb 10, anion gap 20, creatinine 0.6, AST 163 bilirubin 3.5, WBC 23   CT abdomen pelvis with contrast showed no leakage or damage of AAA repair site"  Later afternoon of admission, pt had coffee-ground emesis x 2, with Hbg 10.6 >> 8.6.   GI was consulted.  Further hospital course and management as outlined below.  Assessment and Plan:  * Nausea & vomiting Clinically suspect this is due alcohol gastritis.  CTA chest/abdominal/pelvis on admission showed changes consistent with esophagitis (neoplasm not excluded)... extensive other findings unrelated to N/V - see report. --Monitor off IV fluids, resume if poor PO intake --PRN Zofran  --IV PPI BID --GI consulted given coffee-ground emesis  Hypophosphatemia Phos < 1.0 IV K-phos 30 mmol ordered for replacement.  Monitor phos level and replace PRN  Hypomagnesemia Initial Mg 1.2.  Replaced on admission. Mg 1.8 >> 1.6 this AM --IV Mg-sulfate ordered  --Monitor Mg and replace PRN   Hypokalemia Initial K 2.8 on admission, replaced. K 3.2 this  AM, further replacement underway --Monitor BMP, Mg levels, Phos level  GI bleed Acute blood loss anemia Pt had coffee-ground emesis x 2 in ED day of admission, with 2+ pt Hbg decline. 8/13 -- Hbg stable 7.8 >> 8.3 --GI consulted --Continue IV PPI BID --Clear liquids for now --Diet per GI --Trend Hbg and transfuse if < 7 or if <8 with active bleeding signs  Alcohol withdrawal (HCC) --CIWA protocol w/ PRN Ativan  --Escalating CIWA scores, will add scheduled Librium for some steady control of symptoms and hopefully reduce Ativan requirements  Leukocytosis with left shift.  Pt denies cough, dysuria, diarrhea.  CT chest showed no acute infiltrates, UA negative. Suspect this is reactive to repeated nauseous vomiting and acidosis --Hold off antibiotics for now --Monitor closely for s/sx's of infection  Alcoholic ketoacidosis Resolved.  Presented with significant volume contraction from repeated nauseous vomiting. --Off IV fluids resuscitation --Monitor labs  Gastritis See nausea/vomiting GI following  Transaminitis Acute on chronic.  Likely due to alcoholic hepatitis.  MDF < 32 on admission, no steroids indicated. Acute hyperbilirubinemia - possibly due to upper GI bleeding. --Monitor LFT's --GI following  PVD (peripheral vascular disease) (HCC) Hold Plavix with possible upper GI bleed. Continue statin  Bilirubinemia .  Abdominal aortic aneurysm (AAA) (HCC) No endoleak on CTA on admission. Monitor.  Macrocytic anemia Chronic.  Now with acute blood loss anemia likely from upper GI bleed given coffee-ground emesis. --Monitor Hbg closely  Alcohol abuse .        Subjective: Pt sleeping comfortably, having been given Ativan for CIWA score of 11 this AM.  He wakes  briefly to voice, denies any complaints. States feeling a little better.   Physical Exam: Vitals:   03/22/23 2006 03/23/23 0101 03/23/23 0819 03/23/23 0826  BP: 108/73 136/87 115/75   Pulse: (!) 106 (!)  107 (!) 124 98  Resp: 18 18 16    Temp: 98.7 F (37.1 C) 100 F (37.8 C) 98.5 F (36.9 C)   TempSrc: Oral Oral Oral   SpO2: 96% 97% 100%   Weight:      Height:       General exam: somnolent, wakes to voice, no acute distress, chronically ill appearing HEENT: moist mucus membranes, hearing grossly normal  Respiratory system: CTAB, no wheezes, rales or rhonchi, normal respiratory effort. Cardiovascular system: normal S1/S2, RRR, no peripheral edema Gastrointestinal system: soft, NT, ND. Central nervous system: exam limited by somnolence, no gross focal neurologic deficits, normal speech Extremities: moves all, no edema, normal tone Skin: dry, intact, normal temperature Psychiatry: exam limited by somnolence / sedation   Data Reviewed:  Notable labs --- K 3.4 >> 3.2, bicarb normalized, glucose 124, BUN < 5, Cr 0.34, Ca 8.1, albumin 2.2, AST 141, Alk phos 155, T bili 3.4 >> 3.2  Hbg trend: 10.6 >> 8.6 >> 8.3 >> 7.8 >> 8.3 Mg 1.6 Phos < 1.0  Recent CIWA scores: 3 >> 6 >> 11 >> 11 >> 11  Family Communication: None present.   Disposition: Status is: Inpatient Remains inpatient appropriate because: Ongoing evaluation and requiring IV therapies   Planned Discharge Destination: Home    Time spent: 38 minutes  Author: Pennie Banter, DO 03/23/2023 12:37 PM  For on call review [email or web address].

## 2023-03-23 NOTE — Assessment & Plan Note (Addendum)
Phos < 1.0 IV K-phos 30 mmol ordered for replacement.  Monitor phos level and replace PRN

## 2023-03-23 NOTE — Progress Notes (Signed)
Ryan Repress, MD [address]  [address]  [place], [place] [address]  Main: [phone]  Fax: [phone] Pager: [phone]   Subjective: No acute events overnight.  Patient is lethargic, minimally verbal, laying in the bed, comfortable, denies any abdominal pain   Objective: Vital signs in last 24 hours: Vitals:   03/22/23 2006 03/23/23 0101 03/23/23 0819 03/23/23 0826  BP: 108/73 136/87 115/75   Pulse: (!) 106 (!) 107 (!) 124 98  Resp: 18 18 16    Temp: 98.7 F (37.1 C) 100 F (37.8 C) 98.5 F (36.9 C)   TempSrc: Oral Oral Oral   SpO2: 96% 97% 100%   Weight:      Height:       Weight change:   Intake/Output Summary (Last 24 hours) at 03/23/2023 1544 Last data filed at 03/23/2023 0143 Gross per 24 hour  Intake 1135.87 ml  Output 500 ml  Net 635.87 ml    Exam: Heart: Tachycardia, regular rhythm Lungs: clear to auscultation Abdomen: soft, nontender, normal bowel sounds   Lab Results:    Latest Ref Rng & Units 03/23/2023    3:52 AM 03/22/2023    4:06 AM 03/21/2023    8:07 PM  CBC  WBC 4.0 - 10.5 K/uL 6.1  10.3    Hemoglobin 13.0 - 17.0 g/dL 8.3  7.8  8.3   Hematocrit 39.0 - 52.0 % 24.1  23.7  24.4   Platelets 150 - 400 K/uL 165  169        Latest Ref Rng & Units 03/23/2023    3:52 AM 03/22/2023    4:06 AM 03/21/2023    4:13 PM  CMP  Glucose 70 - 99 mg/dL 347  425  956   BUN 8 - 23 mg/dL <5  <5  <5   Creatinine 0.61 - 1.24 mg/dL 3.87  5.64  3.32   Sodium 135 - 145 mmol/L 136  134  135   Potassium 3.5 - 5.1 mmol/L 3.2  3.4  3.5   Chloride 98 - 111 mmol/L 103  104  101   CO2 22 - 32 mmol/L 25  16  10    Calcium 8.9 - 10.3 mg/dL 8.1  7.4  7.3   Total Protein 6.5 - 8.1 g/dL 6.0  6.0    Total Bilirubin 0.3 - 1.2 mg/dL 3.2  3.4    Alkaline Phos 38 - 126 U/L 155  145    AST 15 - 41 U/L 141  133    ALT 0 - 44 U/L 23  24      Micro Results: No results found for this or any previous visit (from the past 240 hour(s)). Studies/Results: DG Wrist  2 Views Left  Result Date: 03/21/2023 CLINICAL DATA:  Wrist pain, no injury EXAM: LEFT WRIST - 2 VIEW COMPARISON:  None Available. FINDINGS: No fracture or dislocation is seen. Degenerative changes with radiocarpal narrowing. The visualized soft tissues are unremarkable. IMPRESSION: No fracture or dislocation is seen. Degenerative changes, as above. Electronically Signed   By: Charline Bills M.D.   On: 03/21/2023 20:34   DG Elbow 2 Views Left  Result Date: 03/21/2023 CLINICAL DATA:  90955 Arm pain 95188 EXAM: LEFT ELBOW - 2 VIEW COMPARISON:  None Available. FINDINGS: No cortical erosion or destruction. There is no evidence of fracture, dislocation, or joint effusion. There is no evidence of arthropathy or other focal bone abnormality. Partially visualized diffuse anterior subcutaneus soft tissue edema and emphysema. IMPRESSION: 1.  No acute displaced fracture or dislocation. 2. No radiographic findings suggest osteomyelitis. 3. Partially visualized diffuse anterior subcutaneus soft tissue edema and emphysema. Electronically Signed   By: Tish Frederickson M.D.   On: 03/21/2023 19:37   Medications: I have reviewed the patient's current medications. Prior to Admission:  Medications Prior to Admission  Medication Sig Dispense Refill Last Dose   acetaminophen (TYLENOL) 500 MG tablet Take one or two tablet by mouth every 6  hours as needed for pain 90 tablet 0 Past Month   aspirin 81 MG chewable tablet Chew 81 mg by mouth daily as needed.   Past Week   atorvastatin (LIPITOR) 40 MG tablet Take 1 tablet (40 mg total) by mouth daily. 90 tablet 3 03/20/2023   clopidogrel (PLAVIX) 75 MG tablet Take 1 tablet (75 mg total) by mouth daily. 30 tablet 3 03/20/2023   losartan (COZAAR) 25 MG tablet Take 1 tablet (25 mg total) by mouth daily. 90 tablet 3 03/20/2023   melatonin 3 MG TABS tablet Take 3 mg by mouth at bedtime.   Past Week   senna-docusate (SENOKOT-S) 8.6-50 MG tablet Take 2 tablets by mouth at bedtime. For  constipation.  Also available over-the-counter 60 tablet 3 unk   traMADol (ULTRAM) 50 MG tablet Take 1 tablet (50 mg total) by mouth every 6 (six) hours as needed for up to 20 doses for moderate pain or severe pain. (Patient not taking: Reported on 03/21/2023) 20 tablet 0 Not Taking   Scheduled:  atorvastatin  40 mg Oral Daily   chlordiazePOXIDE  25 mg Oral TID   folic acid  1 mg Oral Daily   magnesium oxide  400 mg Oral BID   melatonin  2.5 mg Oral QHS   multivitamin with minerals  1 tablet Oral Daily   pantoprazole (PROTONIX) IV  40 mg Intravenous Q12H   phosphorus  500 mg Oral BID   pneumococcal 20-valent conjugate vaccine  0.5 mL Intramuscular Tomorrow-1000   senna-docusate  2 tablet Oral QHS   sucralfate  1 g Oral TID WC & HS   thiamine  100 mg Oral Daily   Or   thiamine  100 mg Intravenous Daily   Continuous:  potassium PHOSPHATE IVPB (in mmol) 30 mmol (03/23/23 0954)   ZOX:[id number] **OR** acetaminophen, aspirin, LORazepam **OR** LORazepam, ondansetron (ZOFRAN) IV, mouth rinse, phenol Anti-infectives (From admission, onward)    None      Scheduled Meds:  atorvastatin  40 mg Oral Daily   chlordiazePOXIDE  25 mg Oral TID   folic acid  1 mg Oral Daily   magnesium oxide  400 mg Oral BID   melatonin  2.5 mg Oral QHS   multivitamin with minerals  1 tablet Oral Daily   pantoprazole (PROTONIX) IV  40 mg Intravenous Q12H   phosphorus  500 mg Oral BID   pneumococcal 20-valent conjugate vaccine  0.5 mL Intramuscular Tomorrow-1000   senna-docusate  2 tablet Oral QHS   sucralfate  1 g Oral TID WC & HS   thiamine  100 mg Oral Daily   Or   thiamine  100 mg Intravenous Daily   Continuous Infusions:  potassium PHOSPHATE IVPB (in mmol) 30 mmol (03/23/23 0954)   PRN Meds:.acetaminophen **OR** acetaminophen, aspirin, LORazepam **OR** LORazepam, ondansetron (ZOFRAN) IV, mouth rinse, phenol   Assessment: Principal Problem:   Nausea & vomiting Active Problems:   Alcohol  abuse   Transaminitis   Macrocytic anemia   Abdominal aortic aneurysm (AAA) (HCC)   Gastritis  Bilirubinemia   Alcohol withdrawal (HCC)   GI bleed   Hypokalemia   Alcoholic ketoacidosis   Leukocytosis   Hypomagnesemia   PVD (peripheral vascular disease) (HCC)   Hypophosphatemia  Ryan Rose is a 62 y.o. male with history of AAA, status post endovascular repair on aspirin and Plavix, chronic alcohol use, hypertension presented with nausea, vomiting, burning chest pain, coffee-ground emesis, acute on chronic anemia.  Patient has chronic macrocytic anemia secondary to alcohol use   Plan: Coffee-ground emesis with acute on chronic anemia: No further episodes since hospitalization Hemoglobin is stable Patient has been on aspirin and Plavix as outpatient.  Plavix is held during this admission Continue Protonix 40 mg IV twice daily Continue Carafate suspension 3 to 4 hours daily Antiemetics Monitor CBC closely to maintain hemoglobin above 8 Iron studies not consistent with iron deficiency anemia, likely has a component of anemia of chronic disease and bone marrow suppression from alcohol abuse Normal B12 and folate levels Patient will need EGD after discontinuation of Plavix at least for 5 days, last dose on 8/10, will plan for EGD on 8/15 Recommend soft diet   Chronically elevated LFTs, AST more than twice ALT secondary to chronic alcohol use, alcoholic liver disease No evidence of cirrhosis of liver based on ultrasound from 02/2023 Severe diffuse hepatic steatosis based on imaging, no evidence of portal hypertension or splenomegaly Viral hepatitis panel negative Continue multivitamin, thiamine and folate daily Abstinence from alcohol use Recommend to start gabapentin 300 to 600 mg twice daily to prevent alcohol withdrawal Replete potassium, magnesium and phosphorus   Thank you for involving me in the care of this patient.     LOS: 2 days     03/23/2023, 3:44  PM

## 2023-03-24 DIAGNOSIS — E8729 Other acidosis: Secondary | ICD-10-CM | POA: Diagnosis not present

## 2023-03-24 MED ORDER — POTASSIUM CHLORIDE CRYS ER 20 MEQ PO TBCR: 40.0000 meq | EXTENDED_RELEASE_TABLET | ORAL | Status: DC

## 2023-03-24 MED ORDER — POTASSIUM CHLORIDE 10 MEQ/100ML IV SOLN
10.0000 meq | INTRAVENOUS | Status: AC
  Administered 2023-03-24 (×6): 10 meq via INTRAVENOUS
  Filled 2023-03-24 (×4): qty 100

## 2023-03-24 MED ORDER — MAGNESIUM SULFATE 2 GM/50ML IV SOLN
2.0000 g | Freq: Once | INTRAVENOUS | Status: AC
  Administered 2023-03-24: 2 g via INTRAVENOUS
  Filled 2023-03-24: qty 50

## 2023-03-24 MED ORDER — POTASSIUM PHOSPHATES 15 MMOLE/5ML IV SOLN
30.0000 mmol | Freq: Once | INTRAVENOUS | Status: AC
  Administered 2023-03-24: 30 mmol via INTRAVENOUS
  Filled 2023-03-24: qty 10

## 2023-03-24 NOTE — Progress Notes (Signed)
Progress Note   Patient: Ryan Rose GMW:[id number] DOB: [birth year] DOA: 03/21/2023     3 DOS: the patient was seen and examined on 03/24/2023      Subjective:  Patient seen and examined at bedside this morning Drowsy but responsive however unable to answer questions Noted to have severe electrolyte  derangement which is being repleted   Brief hospital course:   HPI on admission 03/22/23 by Dr. Chipper Herb: "Ryan Rose is a 62 y.o. male with medical history significant of AAA status post repair and stenting, alcohol abuse, CAD, HTN, PVD, presented with persistent nauseous vomiting chest pain abdominal pain.   Symptoms started yesterday patient started to develop nauseous followed by frequent vomiting of stomach content multiple times associated with burning sensation behind the chest and cramping-like epigastric pain, unable to take any food or water down whole day yesterday.  Denies any diarrhea, no fever or chills.  He admitted he drinks occasionally and last drink was 2 days ago.   ED Course: Afebrile, borderline tachycardia nonhypotensive nonhypoxic.  Blood work showed K2.8, bicarb 10, anion gap 20, creatinine 0.6, AST 163 bilirubin 3.5, WBC 23   CT abdomen pelvis with contrast showed no leakage or damage of AAA repair site"  Further hospital course and management as outlined below.   Assessment and Plan:    Alcohol withdrawal (HCC) CIWA protocol w/ PRN Ativan  Patient has been having escalating CIWA scores Librium added Continue to need CIWA protocol closely We will feed as able to tolerate given mental status  * Nausea & vomiting Clinically suspect this is due alcohol gastritis.  CTA chest/abdominal/pelvis on admission showed changes consistent with esophagitis (neoplasm not excluded)... extensive other findings unrelated to N/V - see report. Continue as needed IV fluid Continue Protonix twice daily as well as Zofran as needed Gastroenterologist on board we  appreciate input   Hypophosphatemia-improving Continue phosphorus replacement therapy and monitoring   Hypomagnesemia Initial Mg 1.2.  Replaced on admission. Continue magnesium replacement and monitoring     Hypokalemia Initial K 2.8 on admission, replaced. Continue potassium replacement and monitoring   GI bleed Acute blood loss anemia Pt had coffee-ground emesis x 2 in ED day of admission, with 2+ pt Hbg decline. 8/13 -- Hbg stable 7.8 >> 8.3 Gastroenterologist on board.  Appreciate input Continue IV PPI twice daily Diet advancement as tolerated Continue to monitor CBC and transfuse with hemoglobin less than 7    Leukocytosis-resolved with left shift.  Pt denies cough, dysuria, diarrhea.  CT chest showed no acute infiltrates, UA negative. Suspect this is reactive to repeated nauseous vomiting and acidosis Continue to hold antibiotics for now   Alcoholic ketoacidosis Resolved.  Presented with significant volume contraction from repeated nauseous vomiting. --Off IV fluids resuscitation Sinew to monitor labs closely   Gastritis Gastroenterologist on board Continue PPI therapy   Transaminitis Acute on chronic.  Likely due to alcoholic hepatitis.  MDF < 32 on admission, no steroids indicated.  Acute hyperbilirubinemia - possibly due to upper GI bleeding. Continue LFT monitoring   PVD (peripheral vascular disease) (HCC) Hold Plavix with possible upper GI bleed. Continue statin therapy    Abdominal aortic aneurysm (AAA) (HCC) No endoleak on CTA on admission. Monitor.   Macrocytic anemia Chronic.  Now with acute blood loss anemia likely from upper GI bleed given coffee-ground emesis. --Monitor Hbg closely   Alcohol abuse-we will counsel on cessation when medically stable    Physical examination: General exam: Somnolent chronically ill looking  HEENT: moist mucus membranes, hearing grossly normal  Respiratory system: CTAB, no wheezes, rales or rhonchi, normal  respiratory effort. Cardiovascular system: Normal S1/S2 Gastrointestinal system: soft, NT, ND. Central nervous system: exam limited by somnolence, no gross focal neurologic deficits, normal speech Extremities: moves all, no edema, normal tone Skin: dry, intact, normal temperature      Data Reviewed: I have reviewed patient's CBC as well as BMP results today    Latest Ref Rng & Units 03/24/2023    3:09 AM 03/23/2023    3:52 AM 03/22/2023    4:06 AM  CBC  WBC 4.0 - 10.5 K/uL 5.0  6.1  10.3   Hemoglobin 13.0 - 17.0 g/dL 7.8  8.3  7.8   Hematocrit 39.0 - 52.0 % 22.5  24.1  23.7   Platelets 150 - 400 K/uL 149  165  169          Latest Ref Rng & Units 03/24/2023    3:09 AM 03/23/2023    3:52 AM 03/22/2023    4:06 AM  BMP  Glucose 70 - 99 mg/dL 284  132  440   BUN 8 - 23 mg/dL <5  <5  <5   Creatinine 0.61 - 1.24 mg/dL 1.02  7.25  3.66   Sodium 135 - 145 mmol/L 133  136  134   Potassium 3.5 - 5.1 mmol/L 2.9  3.2  3.4   Chloride 98 - 111 mmol/L 99  103  104   CO2 22 - 32 mmol/L 27  25  16    Calcium 8.9 - 10.3 mg/dL 7.6  8.1  7.4        Family Communication: None present.    Disposition: Status is: Inpatient Remains inpatient appropriate because: Ongoing evaluation and requiring IV therapies    Planned Discharge Destination: Home       Time spent: 40 minutes    Vitals:   03/23/23 2014 03/24/23 0556 03/24/23 0819 03/24/23 1216  BP: 101/65 (!) 119/106 109/84 (!) 123/94  Pulse: (!) 106 (!) 125 (!) 116 (!) 112  Resp: 16 19 18 20   Temp: 97.7 F (36.5 C) 98.7 F (37.1 C) 98.3 F (36.8 C) 100.3 F (37.9 C)  TempSrc:  Oral Oral Axillary  SpO2: 98% 96%  95%  Weight:      Height:        Author: Loyce Dys, MD 03/24/2023 3:59 PM  For on call review [email or web address].

## 2023-03-24 NOTE — Plan of Care (Signed)
  Problem: Education: Goal: Knowledge of General Education information will improve Description: Including pain rating scale, medication(s)/side effects and non-pharmacologic comfort measures Outcome: Not Progressing   Problem: Health Behavior/Discharge Planning: Goal: Ability to manage health-related needs will improve Outcome: Not Progressing   Problem: Clinical Measurements: Goal: Ability to maintain clinical measurements within normal limits will improve Outcome: Progressing Goal: Will remain free from infection Outcome: Progressing Goal: Diagnostic test results will improve Outcome: Progressing Goal: Respiratory complications will improve Outcome: Progressing Goal: Cardiovascular complication will be avoided Outcome: Progressing   Problem: Activity: Goal: Risk for activity intolerance will decrease Outcome: Not Progressing   Problem: Nutrition: Goal: Adequate nutrition will be maintained Outcome: Not Progressing   Problem: Coping: Goal: Level of anxiety will decrease Outcome: Progressing   Problem: Elimination: Goal: Will not experience complications related to bowel motility Outcome: Progressing Goal: Will not experience complications related to urinary retention Outcome: Progressing   Problem: Pain Managment: Goal: General experience of comfort will improve Outcome: Progressing   Problem: Safety: Goal: Ability to remain free from injury will improve Outcome: Progressing   Problem: Skin Integrity: Goal: Risk for impaired skin integrity will decrease Outcome: Progressing   

## 2023-03-24 NOTE — TOC Progression Note (Signed)
Transition of Care [facility]) - Progression Note    Patient Details  Name: [name] MRN: [id number] Date of Birth: 1960-10-15  Transition of [facility]) CM/SW Contact  Allena Katz, LCSW Phone Number: 03/24/2023, 3:01 PM  Clinical Narrative:   TOC following for care plan updates and needs EGD scheduled for tomorrow.         Expected Discharge Plan and Services                                               Social Determinants of Health (SDOH) Interventions SDOH Screenings   Food Insecurity: Patient Declined (03/21/2023)  Housing: Patient Declined (03/21/2023)  Transportation Needs: No Transportation Needs (03/21/2023)  Utilities: Not At Risk (03/21/2023)  Tobacco Use: High Risk (03/21/2023)    Readmission Risk Interventions     No data to display

## 2023-03-25 ENCOUNTER — Encounter
Admission: EM | Disposition: A | Discharge status: Skilled Nursing Facility | Payer: Self-pay | Source: Home / Self Care | Attending: Hospitalist

## 2023-03-25 ENCOUNTER — Inpatient Hospital Stay: Payer: Commercial Managed Care - PPO | Admitting: Anesthesiology

## 2023-03-25 ENCOUNTER — Inpatient Hospital Stay: Payer: Commercial Managed Care - PPO

## 2023-03-25 ENCOUNTER — Encounter: Payer: Self-pay | Admitting: Internal Medicine

## 2023-03-25 DIAGNOSIS — K31819 Angiodysplasia of stomach and duodenum without bleeding: Secondary | ICD-10-CM | POA: Diagnosis not present

## 2023-03-25 DIAGNOSIS — K920 Hematemesis: Secondary | ICD-10-CM | POA: Diagnosis not present

## 2023-03-25 DIAGNOSIS — K209 Esophagitis, unspecified without bleeding: Secondary | ICD-10-CM

## 2023-03-25 DIAGNOSIS — E8729 Other acidosis: Secondary | ICD-10-CM | POA: Diagnosis not present

## 2023-03-25 HISTORY — PX: ESOPHAGOGASTRODUODENOSCOPY: SHX5428

## 2023-03-25 HISTORY — PX: HOT HEMOSTASIS: SHX5433

## 2023-03-25 LAB — CBC WITH DIFFERENTIAL/PLATELET
Abs Immature Granulocytes: 0.03 10*3/uL (ref 0.00–0.07)
Basophils Absolute: 0 10*3/uL (ref 0.0–0.1)
Basophils Relative: 1 %
Eosinophils Absolute: 0 10*3/uL (ref 0.0–0.5)
Eosinophils Relative: 1 %
HCT: 23.9 % — ABNORMAL LOW (ref 39.0–52.0)
Hemoglobin: 8.2 g/dL — ABNORMAL LOW (ref 13.0–17.0)
Immature Granulocytes: 1 %
Lymphocytes Relative: 12 %
Lymphs Abs: 0.8 10*3/uL (ref 0.7–4.0)
MCH: 34.3 pg — ABNORMAL HIGH (ref 26.0–34.0)
MCHC: 34.3 g/dL (ref 30.0–36.0)
MCV: 100 fL (ref 80.0–100.0)
Monocytes Absolute: 0.6 10*3/uL (ref 0.1–1.0)
Monocytes Relative: 10 %
Neutro Abs: 4.9 10*3/uL (ref 1.7–7.7)
Neutrophils Relative %: 75 %
Platelets: 154 10*3/uL (ref 150–400)
RBC: 2.39 MIL/uL — ABNORMAL LOW (ref 4.22–5.81)
RDW: 14.8 % (ref 11.5–15.5)
Smear Review: NORMAL
WBC: 6.4 10*3/uL (ref 4.0–10.5)
nRBC: 0.8 % — ABNORMAL HIGH (ref 0.0–0.2)

## 2023-03-25 LAB — BASIC METABOLIC PANEL
Anion gap: 9 (ref 5–15)
BUN: <5 mg/dL — ABNORMAL LOW (ref 8–23)
CO2: 22 mmol/L (ref 22–32)
Calcium: 7.5 mg/dL — ABNORMAL LOW (ref 8.9–10.3)
Chloride: 100 mmol/L (ref 98–111)
Creatinine, Ser: 0.47 mg/dL — ABNORMAL LOW (ref 0.61–1.24)
GFR, Estimated: >60 mL/min (ref >60)
Glucose, Bld: 100 mg/dL — ABNORMAL HIGH (ref 70–99)
Potassium: 3.8 mmol/L (ref 3.5–5.1)
Sodium: 131 mmol/L — ABNORMAL LOW (ref 135–145)

## 2023-03-25 LAB — URINALYSIS, COMPLETE (UACMP) WITH MICROSCOPIC
Bilirubin Urine: NEGATIVE
Glucose, UA: NEGATIVE mg/dL
Hgb urine dipstick: NEGATIVE
Ketones, ur: 5 mg/dL — AB
Leukocytes,Ua: NEGATIVE
Nitrite: POSITIVE — AB
Protein, ur: NEGATIVE mg/dL
Specific Gravity, Urine: 1.014 (ref 1.005–1.030)
pH: 7 (ref 5.0–8.0)

## 2023-03-25 LAB — PHOSPHORUS: Phosphorus: 2.3 mg/dL — ABNORMAL LOW (ref 2.5–4.6)

## 2023-03-25 SURGERY — EGD (ESOPHAGOGASTRODUODENOSCOPY)
Anesthesia: General

## 2023-03-25 MED ORDER — SODIUM CHLORIDE 0.9 % IV SOLN
INTRAVENOUS | Status: DC | PRN
Start: 2023-03-25 — End: 2023-03-25

## 2023-03-25 MED ORDER — PROPOFOL 10 MG/ML IV BOLUS
INTRAVENOUS | Status: DC | PRN
Start: 2023-03-25 — End: 2023-03-25
  Administered 2023-03-25: 20 mg via INTRAVENOUS
  Administered 2023-03-25: 10 mg via INTRAVENOUS
  Administered 2023-03-25: 20 mg via INTRAVENOUS
  Administered 2023-03-25: 50 mg via INTRAVENOUS

## 2023-03-25 MED ORDER — PROPOFOL 10 MG/ML IV BOLUS
INTRAVENOUS | Status: AC
  Filled 2023-03-25: qty 40

## 2023-03-25 MED ORDER — LIDOCAINE HCL (CARDIAC) PF 100 MG/5ML IV SOSY
PREFILLED_SYRINGE | INTRAVENOUS | Status: DC | PRN
  Administered 2023-03-25: 50 mg via INTRAVENOUS

## 2023-03-25 MED ORDER — PANTOPRAZOLE SODIUM 40 MG PO TBEC
40.0000 mg | DELAYED_RELEASE_TABLET | Freq: Two times a day (BID) | ORAL | Status: DC
  Administered 2023-03-25 – 2023-03-29 (×6): 40 mg via ORAL
  Filled 2023-03-25 (×9): qty 1

## 2023-03-25 MED ORDER — SODIUM CHLORIDE 0.9 % IV SOLN
1.0000 g | INTRAVENOUS | Status: DC
  Administered 2023-03-25: 1 g via INTRAVENOUS
  Filled 2023-03-25 (×2): qty 10

## 2023-03-25 MED ORDER — LIDOCAINE HCL (PF) 2 % IJ SOLN
INTRAMUSCULAR | Status: AC
  Filled 2023-03-25: qty 5

## 2023-03-25 NOTE — H&P (Signed)
Wyline Mood, MD [address], [place], [place], [address] [address], [place], [place], [address] Phone: [phone]  Fax: [phone]  Primary Care Physician:  Sherron Monday, MD   Pre-Procedure History & Physical: HPI:  Ryan Rose is a 62 y.o. male is here for an endoscopy    Past Medical History:  Diagnosis Date   Anemia    Aortic atherosclerosis (HCC)    Arthritis    Bilateral carpal tunnel syndrome    Coronary artery disease    Diastolic dysfunction    a.) TTE 08/24/2022: EF 60-65%, mild-mod MR, G1DD.   ETOH abuse    Hepatic steatosis    History of methicillin resistant staphylococcus aureus (MRSA)    HLD (hyperlipidemia)    HTN (hypertension)    Infrarenal abdominal aortic aneurysm (AAA) without rupture (HCC) 05/28/2022   a.) CT AP 05/28/2022: saccular thrombosed; measures 4.4 x 3.2 cm   Nausea & vomiting 03/21/2023   PVD (peripheral vascular disease) with claudication (HCC)    Sigmoid diverticulosis    Transaminitis     Past Surgical History:  Procedure Laterality Date   CERVICAL LAMINOPLASTY  08/20/2021   right C3-C6   COLONOSCOPY     ENDOVASCULAR REPAIR/STENT GRAFT N/A 09/09/2022   Procedure: ENDOVASCULAR REPAIR/STENT GRAFT;  Surgeon: Renford Dills, MD;  Location: [facility] INVASIVE CV LAB;  Service: Cardiovascular;  Laterality: N/A;   SKIN GRAFT     to hand and forearm    Prior to Admission medications   Medication Sig Start Date End Date Taking? Authorizing Provider  acetaminophen (TYLENOL) 500 MG tablet Take one or two tablet by mouth every 6  hours as needed for pain 05/13/22  Yes   aspirin 81 MG chewable tablet Chew 81 mg by mouth daily as needed.   Yes [provider]  atorvastatin (LIPITOR) 40 MG tablet Take 1 tablet (40 mg total) by mouth daily. 08/13/22 08/08/23 Yes Agbor-Etang, Arlys John, MD  clopidogrel (PLAVIX) 75 MG tablet Take 1 tablet (75 mg total) by mouth daily. 09/10/22  Yes Pace, Brien R, NP   losartan (COZAAR) 25 MG tablet Take 1 tablet (25 mg total) by mouth daily. 08/13/22 08/08/23 Yes Agbor-Etang, Arlys John, MD  melatonin 3 MG TABS tablet Take 3 mg by mouth at bedtime.   Yes [provider]  senna-docusate (SENOKOT-S) 8.6-50 MG tablet Take 2 tablets by mouth at bedtime. For constipation.  Also available over-the-counter 05/30/22  Yes Rai, Ripudeep K, MD  traMADol (ULTRAM) 50 MG tablet Take 1 tablet (50 mg total) by mouth every 6 (six) hours as needed for up to 20 doses for moderate pain or severe pain. Patient not taking: Reported on 03/21/2023 05/30/22   Cathren Harsh, MD    Allergies as of 03/21/2023 - Review Complete 03/21/2023  Allergen Reaction Noted   Sulfa antibiotics Hives 11/25/2014    Family History  Problem Relation Age of Onset   Heart attack Mother     Social History   Socioeconomic History   Marital status: Married    Spouse name: [name]   Number of children: Not on file   Years of education: Not on file   Highest education level: Not on file  Occupational History   Not on file  Tobacco Use   Smoking status: Some Days    Current packs/day: 0.10    Average packs/day: 0.1 packs/day for 45.0 years (4.5 ttl pk-yrs)    Types: Cigarettes   Smokeless tobacco:  Never   Tobacco comments:    Smokes 5 cigarettes weekly  Vaping Use   Vaping status: Never Used  Substance and Sexual Activity   Alcohol use: Yes    Comment: occasional   Drug use: Yes    Types: Marijuana    Comment: occasionally   Sexual activity: Not on file  Other Topics Concern   Not on file  Social History Narrative   Not on file   Social Determinants of Health   Financial Resource Strain: Not on file  Food Insecurity: Patient Declined (03/21/2023)   Hunger Vital Sign    Worried About Running Out of Food in the Last Year: Patient declined    Ran Out of Food in the Last Year: Patient declined  Transportation Needs: No Transportation Needs (03/21/2023)   PRAPARE -  Administrator, Civil Service (Medical): No    Lack of Transportation (Non-Medical): No  Physical Activity: Not on file  Stress: Not on file  Social Connections: Not on file  Intimate Partner Violence: Not At Risk (03/21/2023)   Humiliation, Afraid, Rape, and Kick questionnaire    Fear of Current or Ex-Partner: No    Emotionally Abused: No    Physically Abused: No    Sexually Abused: No    Review of Systems: See HPI, otherwise negative ROS  Physical Exam: BP (!) 114/90   Pulse (!) 115   Temp 97.6 F (36.4 C) (Temporal)   Resp 19   Ht 5' 8.5" (1.74 m)   Wt 63.2 kg   SpO2 99%   BMI 20.88 kg/m  General:   Alert,  pleasant and cooperative in NAD Head:  Normocephalic and atraumatic. Neck:  Supple; no masses or thyromegaly. Lungs:  Clear throughout to auscultation, normal respiratory effort.    Heart:  +S1, +S2, Regular rate and rhythm, No edema. Abdomen:  Soft, nontender and nondistended. Normal bowel sounds, without guarding, and without rebound.   Neurologic:  Alert and  oriented x1;   Impression/Plan: Ryan Rose is here for an endoscopy  to be performed for  evaluation of coffee ground emesis    Risks, benefits, limitations, and alternatives regarding endoscopy have been reviewed with the patient.  Questions have been answered.  All parties agreeable.   Wyline Mood, MD  03/25/2023, 10:03 AM

## 2023-03-25 NOTE — Plan of Care (Signed)
  Problem: Education: Goal: Knowledge of General Education information will improve Description: Including pain rating scale, medication(s)/side effects and non-pharmacologic comfort measures Outcome: Progressing   Problem: Clinical Measurements: Goal: Ability to maintain clinical measurements within normal limits will improve Outcome: Progressing   Problem: Activity: Goal: Risk for activity intolerance will decrease Outcome: Progressing   Problem: Nutrition: Goal: Adequate nutrition will be maintained Outcome: Progressing   Problem: Safety: Goal: Ability to remain free from injury will improve Outcome: Progressing   

## 2023-03-25 NOTE — Anesthesia Postprocedure Evaluation (Signed)
Anesthesia Post Note  Patient: Ryan Rose  Procedure(s) Performed: ESOPHAGOGASTRODUODENOSCOPY (EGD) HOT HEMOSTASIS (ARGON PLASMA COAGULATION/BICAP)  Patient location during evaluation: Endoscopy Anesthesia Type: General Level of consciousness: awake and alert Pain management: pain level controlled Vital Signs Assessment: post-procedure vital signs reviewed and stable Respiratory status: spontaneous breathing, nonlabored ventilation, respiratory function stable and patient connected to nasal cannula oxygen Cardiovascular status: blood pressure returned to baseline and stable Postop Assessment: no apparent nausea or vomiting Anesthetic complications: no   No notable events documented.   Last Vitals:  Vitals:   03/25/23 1048 03/25/23 1055  BP: 116/86 111/80  Pulse: (!) 114 (!) 106  Resp: 19 (!) 27  Temp:    SpO2: 97% 95%    Last Pain:  Vitals:   03/25/23 1038  TempSrc: Temporal  PainSc: Asleep                 Louie Boston

## 2023-03-25 NOTE — Anesthesia Preprocedure Evaluation (Signed)
Anesthesia Evaluation  Patient identified by MRN, date of birth, ID band Patient awake    Reviewed: Allergy & Precautions, NPO status , Patient's Chart, lab work & pertinent test results  History of Anesthesia Complications Negative for: history of anesthetic complications  Airway Mallampati: III  TM Distance: >3 FB Neck ROM: full    Dental  (+) Edentulous Upper, Edentulous Lower   Pulmonary neg pulmonary ROS, Current Smoker and Patient abstained from smoking.   Pulmonary exam normal        Cardiovascular hypertension, On Medications + CAD and + Peripheral Vascular Disease  Normal cardiovascular exam     Neuro/Psych  Neuromuscular disease  negative psych ROS   GI/Hepatic negative GI ROS,,,(+)     substance abuse  alcohol use  Endo/Other  negative endocrine ROS    Renal/GU negative Renal ROS  negative genitourinary   Musculoskeletal   Abdominal   Peds  Hematology  (+) Blood dyscrasia, anemia   Anesthesia Other Findings Past Medical History: No date: Anemia No date: Aortic atherosclerosis (HCC) No date: Arthritis No date: Bilateral carpal tunnel syndrome No date: Coronary artery disease No date: Diastolic dysfunction     Comment:  a.) TTE 08/24/2022: EF 60-65%, mild-mod MR, G1DD. No date: ETOH abuse No date: Hepatic steatosis No date: History of methicillin resistant staphylococcus aureus (MRSA) No date: HLD (hyperlipidemia) No date: HTN (hypertension) 05/28/2022: Infrarenal abdominal aortic aneurysm (AAA) without  rupture (HCC)     Comment:  a.) CT AP 05/28/2022: saccular thrombosed; measures 4.4               x 3.2 cm 03/21/2023: Nausea & vomiting No date: PVD (peripheral vascular disease) with claudication (HCC) No date: Sigmoid diverticulosis No date: Transaminitis  Past Surgical History: 08/20/2021: CERVICAL LAMINOPLASTY     Comment:  right C3-C6 No date: COLONOSCOPY 09/09/2022: ENDOVASCULAR  REPAIR/STENT GRAFT; N/A     Comment:  Procedure: ENDOVASCULAR REPAIR/STENT GRAFT;  Surgeon:               Renford Dills, MD;  Location: [facility] INVASIVE CV LAB;               Service: Cardiovascular;  Laterality: N/A; No date: SKIN GRAFT     Comment:  to hand and forearm  BMI    Body Mass Index: 20.88 kg/m      Reproductive/Obstetrics negative OB ROS                              Anesthesia Physical Anesthesia Plan  ASA: 3  Anesthesia Plan: General   Post-op Pain Management: Minimal or no pain anticipated   Induction: Intravenous  PONV Risk Score and Plan: 1 and Propofol infusion and TIVA  Airway Management Planned: Natural Airway and Nasal Cannula  Additional Equipment:   Intra-op Plan:   Post-operative Plan:   Informed Consent: I have reviewed the patients History and Physical, chart, labs and discussed the procedure including the risks, benefits and alternatives for the proposed anesthesia with the patient or authorized representative who has indicated his/her understanding and acceptance.     Dental Advisory Given  Plan Discussed with: Anesthesiologist, CRNA and Surgeon  Anesthesia Plan Comments: (Patient consented for risks of anesthesia including but not limited to:  - adverse reactions to medications - risk of airway placement if required - damage to eyes, teeth, lips or other oral mucosa - nerve damage due to positioning  - sore throat  or hoarseness - Damage to heart, brain, nerves, lungs, other parts of body or loss of life  Patient voiced understanding.)         Anesthesia Quick Evaluation

## 2023-03-25 NOTE — Transfer of Care (Signed)
Immediate Anesthesia Transfer of Care Note  Patient: Ryan Rose  Procedure(s) Performed: ESOPHAGOGASTRODUODENOSCOPY (EGD) HOT HEMOSTASIS (ARGON PLASMA COAGULATION/BICAP)  Patient Location: PACU and Endoscopy Unit  Anesthesia Type:General  Level of Consciousness: drowsy and patient cooperative  Airway & Oxygen Therapy: Patient Spontanous Breathing  Post-op Assessment: Report given to RN and Post -op Vital signs reviewed and stable  Post vital signs: Reviewed and stable  Last Vitals:  Vitals Value Taken Time  BP 96/72 03/25/23 1038  Temp    Pulse 114 03/25/23 1038  Resp 31 03/25/23 1038  SpO2 96 % 03/25/23 1038  Vitals shown include unfiled device data.  Last Pain:  Vitals:   03/25/23 0959  TempSrc: Temporal  PainSc: 0-No pain      Patients Stated Pain Goal: 0 (03/22/23 0800)  Complications: No notable events documented.

## 2023-03-25 NOTE — Op Note (Signed)
[facility] Patient Name: Ryan Rose Procedure Date: 03/25/2023 10:15 AM MRN: [id number] Account #: [id number] Date of Birth: [birth year] Admit Type: Inpatient Age: 62 Room: [facility] ENDO ROOM 3 Gender: Male Note Status: Finalized Instrument Name: Upper Endoscope [id number] Procedure:             Upper GI endoscopy Indications:           Hematemesis Providers:             Wyline Mood MD, MD Referring MD:          Silas Flood. Ellsworth Lennox, MD (Referring MD) Medicines:             Monitored Anesthesia Care Complications:         No immediate complications. Procedure:             Pre-Anesthesia Assessment:                        - Prior to the procedure, a History and Physical was                         performed, and patient medications, allergies and                         sensitivities were reviewed. The patient's tolerance                         of previous anesthesia was reviewed.                        - The risks and benefits of the procedure and the                         sedation options and risks were discussed with the                         patient. All questions were answered and informed                         consent was obtained.                        - ASA Grade Assessment: II - A patient with mild                         systemic disease.                        After obtaining informed consent, the endoscope was                         passed under direct vision. Throughout the procedure,                         the patient's blood pressure, pulse, and oxygen                         saturations were monitored continuously. The Endoscope                         was introduced  through the mouth, and advanced to the                         third part of duodenum. The upper GI endoscopy was                         accomplished with ease. The patient tolerated the                         procedure well. Findings:      LA Grade D (one or  more mucosal breaks involving at least 75% of       esophageal circumference) esophagitis with no bleeding was found in the       lower third of the esophagus.      A large hiatal hernia was present.      A single medium angioectasia with no bleeding was found on the lesser       curvature of the stomach. Coagulation for bleeding prevention using       argon plasma at 0.5 liters/minute and 20 watts was successful.      The examined duodenum was normal.      The cardia and gastric fundus were normal on retroflexion. Impression:            - LA Grade D esophagitis with no bleeding.                        - Large hiatal hernia.                        - A single non-bleeding angioectasia in the stomach.                         Treated with argon plasma coagulation (APC).                        - Normal examined duodenum.                        - No specimens collected. Recommendation:        - Return patient to hospital ward for ongoing care.                        - Advance diet as tolerated.                        - Continue on Long trm PPI due to severe esophagitis                         prilosec 40 BID                        Keep head end of bed elevated at all times                        Follow up with Dr Allegra Lai at the outpatient in 8 weeks                         to decide if repeat EGD and evaluation for [facility]  would be needed considering overall health picture . Procedure Code(s):     --- Professional ---                        [id number], Esophagogastroduodenoscopy, flexible,                         transoral; with control of bleeding, any method Diagnosis Code(s):     --- Professional ---                        K20.90, Esophagitis, unspecified without bleeding                        K44.9, Diaphragmatic hernia without obstruction or                         gangrene                        K31.819, Angiodysplasia of stomach and duodenum                          without bleeding                        K92.0, Hematemesis CPT copyright 2022 [employer]. All rights reserved. The codes documented in this report are preliminary and upon coder review may  be revised to meet current compliance requirements. Wyline Mood, MD Wyline Mood MD, MD 03/25/2023 10:28:59 AM This report has been signed electronically. Number of Addenda: 0 Note Initiated On: 03/25/2023 10:15 AM Estimated Blood Loss:  Estimated blood loss: none.      [facility]

## 2023-03-25 NOTE — Progress Notes (Addendum)
Progress Note   Patient: Ryan Rose QMV:[id number] DOB: 01-Jan-1961 DOA: 03/21/2023     4 DOS: the patient was seen and examined on 03/25/2023     Subjective:  Patient seen and examined at bedside this morning Underwent EGD-showed severe esophagitis as well as AVM of the stomach Currently on clear liquid diet Patient would need long-term PPI therapy Appears more awake today compared to yesterday   Brief hospital course:   HPI on admission 03/22/23 by Dr. Chipper Herb: "Ryan Rose is a 62 y.o. male with medical history significant of AAA status post repair and stenting, alcohol abuse, CAD, HTN, PVD, presented with persistent nauseous vomiting chest pain abdominal pain.   Symptoms started yesterday patient started to develop nauseous followed by frequent vomiting of stomach content multiple times associated with burning sensation behind the chest and cramping-like epigastric pain, unable to take any food or water down whole day yesterday.  Denies any diarrhea, no fever or chills.  He admitted he drinks occasionally and last drink was 2 days ago.   ED Course: Afebrile, borderline tachycardia nonhypotensive nonhypoxic.  Blood work showed K2.8, bicarb 10, anion gap 20, creatinine 0.6, AST 163 bilirubin 3.5, WBC 23   CT abdomen pelvis with contrast showed no leakage or damage of AAA repair site"   Further hospital course and management as outlined below.   Assessment and Plan:      Alcohol withdrawal (HCC) CIWA protocol w/ PRN Ativan  Patient has been having escalating CIWA scores Continue Librium Continue to need CIWA protocol closely Continue clear liquid diet   GI bleed Acute blood loss anemia Pt had coffee-ground emesis x 2 in ED day of admission, with 2+ pt Hbg decline. 8/13 -- Hbg stable 7.8 >> 8.3 Underwent EGD-showed severe esophagitis as well as AVM of the stomach Currently on clear liquid diet Patient would need long-term PPI therapy Gastroenterologist on board.   Appreciate input Continue IV PPI twice daily Diet advancement as tolerated Continue to monitor CBC  Fever Overnight patient spiked fever up to 101.3 WBC within normal limits I will obtain a UA and chest x-ray to rule out any infectious sources Fever resolved now, I will withhold antibiotics unless evidence of infection detected  * Nausea & vomiting Clinically suspect this is due alcohol gastritis.  CTA chest/abdominal/pelvis on admission showed changes consistent with esophagitis (neoplasm not excluded)... extensive other findings unrelated to N/V - see report. Continue as needed IV fluid Continue Protonix twice daily as well as Zofran as needed Gastroenterologist on board we appreciate input   Hypophosphatemia-improving Continue phosphorus replacement therapy and monitoring   Hypomagnesemia Initial Mg 1.2.  Replaced on admission. Continue repletion and monitoring of magnesium     Hypokalemia Initial K 2.8 on admission, replaced. Continue repletion and monitoring     Leukocytosis-resolved with left shift.  Pt denies cough, dysuria, diarrhea.  CT chest showed no acute infiltrates, UA negative. Suspect this is reactive to repeated nauseous vomiting and acidosis Continue to hold antibiotics for now   Alcoholic ketoacidosis Resolved.  Presented with significant volume contraction from repeated nauseous vomiting. --Off IV fluids resuscitation Sinew to monitor labs closely   Gastritis Gastroenterologist on board Continue PPI therapy   Transaminitis Acute on chronic.  Likely due to alcoholic hepatitis.  MDF < 32 on admission, no steroids indicated.   Acute hyperbilirubinemia - possibly due to upper GI bleeding. Monitor LFTs   PVD (peripheral vascular disease) (HCC) Hold Plavix with possible upper GI bleed. Continue statin therapy  Abdominal aortic aneurysm (AAA) (HCC) No endoleak on CTA on admission. Monitor.   Macrocytic anemia Chronic.  Now with acute blood  loss anemia likely from upper GI bleed given coffee-ground emesis. --Monitor Hbg closely   Alcohol abuse-we will counsel on cessation when medically stable     Physical examination: General exam: Drowsy but more responsive today HEENT: moist mucus membranes, hearing grossly normal  Respiratory system: CTAB, no wheezes, rales or rhonchi, normal respiratory effort. Cardiovascular system: Normal S1/S2 Gastrointestinal system: soft, NT, ND. Central nervous system: exam limited by somnolence, no gross focal neurologic deficits, normal speech Extremities: moves all, no edema, normal tone Skin: dry, intact, normal temperature     Family Communication: None present.    Disposition: Status is: Inpatient Remains inpatient appropriate because: Ongoing evaluation and requiring IV therapies   Planned Discharge Destination: Home      Data reviewed: I have reviewed patient's lab results below CBC    Component Value Date/Time   WBC 6.4 03/25/2023 0335   RBC 2.39 (L) 03/25/2023 0335   HGB 8.2 (L) 03/25/2023 0335   HGB 14.1 11/25/2014 1016   HCT 23.9 (L) 03/25/2023 0335   HCT 41.8 11/25/2014 1016   PLT 154 03/25/2023 0335   PLT 276 11/25/2014 1016   MCV 100.0 03/25/2023 0335   MCV 101 (H) 11/25/2014 1016   MCH 34.3 (H) 03/25/2023 0335   MCHC 34.3 03/25/2023 0335   RDW 14.8 03/25/2023 0335   RDW 13.0 11/25/2014 1016   LYMPHSABS 0.8 03/25/2023 0335   MONOABS 0.6 03/25/2023 0335   EOSABS 0.0 03/25/2023 0335   BASOSABS 0.0 03/25/2023 0335   basic metabolic panel    Latest Ref Rng & Units 03/25/2023    3:35 AM 03/24/2023    3:09 AM 03/23/2023    3:52 AM  BMP  Glucose 70 - 99 mg/dL 253  664  403   BUN 8 - 23 mg/dL <5  <5  <5   Creatinine 0.61 - 1.24 mg/dL 4.74  2.59  5.63   Sodium 135 - 145 mmol/L 131  133  136   Potassium 3.5 - 5.1 mmol/L 3.8  2.9  3.2   Chloride 98 - 111 mmol/L 100  99  103   CO2 22 - 32 mmol/L 22  27  25    Calcium 8.9 - 10.3 mg/dL 7.5  7.6  8.1      Vitals:    03/25/23 1048 03/25/23 1055 03/25/23 1127 03/25/23 1134  BP: 116/86 111/80  (!) 129/90  Pulse: (!) 114 (!) 106  (!) 105  Resp: 19 (!) 27 18 19   Temp:    98 F (36.7 C)  TempSrc:    Axillary  SpO2: 97% 95%  97%  Weight:      Height:         Author: Loyce Dys, MD 03/25/2023 2:14 PM  For on call review [email or web address].    Addendum at 6:30 PM Urinalysis results back and reviewed by me not strongly suggestive for UTI however given patient's findings with encephalopathy fever tachycardia we will initiate on ceftriaxone. CIWA score reported by nursing staff and subsequently Librium discontinued as patient appears somewhat more sedated.

## 2023-03-26 ENCOUNTER — Encounter: Payer: Self-pay | Admitting: Gastroenterology

## 2023-03-26 DIAGNOSIS — D62 Acute posthemorrhagic anemia: Secondary | ICD-10-CM

## 2023-03-26 DIAGNOSIS — E871 Hypo-osmolality and hyponatremia: Secondary | ICD-10-CM

## 2023-03-26 DIAGNOSIS — F10931 Alcohol use, unspecified with withdrawal delirium: Secondary | ICD-10-CM

## 2023-03-26 DIAGNOSIS — D72829 Elevated white blood cell count, unspecified: Secondary | ICD-10-CM

## 2023-03-26 DIAGNOSIS — R7401 Elevation of levels of liver transaminase levels: Secondary | ICD-10-CM

## 2023-03-26 DIAGNOSIS — K922 Gastrointestinal hemorrhage, unspecified: Secondary | ICD-10-CM | POA: Diagnosis not present

## 2023-03-26 DIAGNOSIS — D539 Nutritional anemia, unspecified: Secondary | ICD-10-CM

## 2023-03-26 DIAGNOSIS — I739 Peripheral vascular disease, unspecified: Secondary | ICD-10-CM

## 2023-03-26 DIAGNOSIS — R112 Nausea with vomiting, unspecified: Secondary | ICD-10-CM | POA: Diagnosis not present

## 2023-03-26 HISTORY — DX: Acute posthemorrhagic anemia: D62

## 2023-03-26 LAB — CBC WITH DIFFERENTIAL/PLATELET
Abs Immature Granulocytes: 0.07 10*3/uL (ref 0.00–0.07)
Basophils Absolute: 0 10*3/uL (ref 0.0–0.1)
Basophils Relative: 0 %
Eosinophils Absolute: 0 10*3/uL (ref 0.0–0.5)
Eosinophils Relative: 0 %
HCT: 17.7 % — ABNORMAL LOW (ref 39.0–52.0)
Hemoglobin: 6.5 g/dL — ABNORMAL LOW (ref 13.0–17.0)
Immature Granulocytes: 1 %
Lymphocytes Relative: 10 %
Lymphs Abs: 0.9 10*3/uL (ref 0.7–4.0)
MCH: 34.9 pg — ABNORMAL HIGH (ref 26.0–34.0)
MCHC: 36.7 g/dL — ABNORMAL HIGH (ref 30.0–36.0)
MCV: 95.2 fL (ref 80.0–100.0)
Monocytes Absolute: 1.5 10*3/uL — ABNORMAL HIGH (ref 0.1–1.0)
Monocytes Relative: 17 %
Neutro Abs: 6.3 10*3/uL (ref 1.7–7.7)
Neutrophils Relative %: 72 %
Platelets: 180 10*3/uL (ref 150–400)
RBC: 1.86 MIL/uL — ABNORMAL LOW (ref 4.22–5.81)
RDW: 15.3 % (ref 11.5–15.5)
WBC: 8.7 10*3/uL (ref 4.0–10.5)
nRBC: 0.9 % — ABNORMAL HIGH (ref 0.0–0.2)

## 2023-03-26 LAB — BASIC METABOLIC PANEL
Anion gap: 10 (ref 5–15)
BUN: 7 mg/dL — ABNORMAL LOW (ref 8–23)
CO2: 21 mmol/L — ABNORMAL LOW (ref 22–32)
Calcium: 7.4 mg/dL — ABNORMAL LOW (ref 8.9–10.3)
Chloride: 100 mmol/L (ref 98–111)
Creatinine, Ser: 0.53 mg/dL — ABNORMAL LOW (ref 0.61–1.24)
GFR, Estimated: >60 mL/min (ref >60)
Glucose, Bld: 96 mg/dL (ref 70–99)
Potassium: 3.2 mmol/L — ABNORMAL LOW (ref 3.5–5.1)
Sodium: 131 mmol/L — ABNORMAL LOW (ref 135–145)

## 2023-03-26 LAB — PREPARE RBC (CROSSMATCH)

## 2023-03-26 MED ORDER — POTASSIUM CHLORIDE CRYS ER 20 MEQ PO TBCR
40.0000 meq | EXTENDED_RELEASE_TABLET | ORAL | Status: AC
  Administered 2023-03-26 (×2): 40 meq via ORAL
  Filled 2023-03-26 (×2): qty 2

## 2023-03-26 MED ORDER — ACETAMINOPHEN 325 MG PO TABS
650.0000 mg | ORAL_TABLET | Freq: Once | ORAL | Status: AC
  Administered 2023-03-26: 650 mg via ORAL
  Filled 2023-03-26: qty 2

## 2023-03-26 MED ORDER — FUROSEMIDE 10 MG/ML IJ SOLN
20.0000 mg | Freq: Once | INTRAMUSCULAR | Status: AC
  Administered 2023-03-26: 20 mg via INTRAVENOUS
  Filled 2023-03-26: qty 2

## 2023-03-26 MED ORDER — CHLORDIAZEPOXIDE HCL 5 MG PO CAPS
5.0000 mg | ORAL_CAPSULE | Freq: Three times a day (TID) | ORAL | Status: DC | PRN
  Filled 2023-03-26: qty 1

## 2023-03-26 MED ORDER — SODIUM CHLORIDE 0.9% IV SOLUTION: Freq: Once | INTRAVENOUS | Status: AC

## 2023-03-26 NOTE — Assessment & Plan Note (Signed)
Last [id number]

## 2023-03-26 NOTE — Progress Notes (Signed)
   03/26/23 1300  Assess: MEWS Score  Temp 98.5 F (36.9 C)  BP (!) 86/66 (patient keep moving arm)  MAP (mmHg) 74  Pulse Rate (!) 101  Resp 18  Level of Consciousness Alert  SpO2 98 %  O2 Device Room Air  Assess: MEWS Score  MEWS Temp 0  MEWS Systolic 1  MEWS Pulse 1  MEWS RR 0  MEWS LOC 0  MEWS Score 2  MEWS Score Color Yellow  Assess: if the MEWS score is Yellow or Red  Were vital signs accurate and taken at a resting state? Yes  Does the patient meet 2 or more of the SIRS criteria? No  MEWS guidelines implemented  Yes, yellow  Treat  MEWS Interventions Considered administering scheduled or prn medications/treatments as ordered  Take Vital Signs  Increase Vital Sign Frequency  Yellow: Q2hr x1, continue Q4hrs until patient remains green for 12hrs  Escalate  MEWS: Escalate Yellow: Discuss with charge nurse and consider notifying provider and/or RRT  Notify: Charge Nurse/RN  Name of Charge Nurse/RN Notified Lawanda Cousins  Provider Notification  Provider Name/Title Dr Renae Gloss  Date Provider Notified 03/26/23  Time Provider Notified 1325  Method of Notification  (text)  Notification Reason Other (Comment) (BP  86/66 MAP 74 HR 101)  Provider response No new orders (recheck vitals in 1 hour)  Date of Provider Response 03/26/23  Time of Provider Response 1335  Assess: SIRS CRITERIA  SIRS Temperature  0  SIRS Pulse 1  SIRS Respirations  0  SIRS WBC 0  SIRS Score Sum  1

## 2023-03-26 NOTE — Plan of Care (Signed)

## 2023-03-26 NOTE — Progress Notes (Signed)
Progress Note   Patient: Ryan Rose [id number] DOB: [birth year] DOA: 03/21/2023     5 DOS: the patient was seen and examined on 03/26/2023   Brief hospital course: HPI on admission 03/22/23 by Dr. Chipper Herb: "Ryan Rose is a 62 y.o. male with medical history significant of AAA status post repair and stenting, alcohol abuse, CAD, HTN, PVD, presented with persistent nauseous vomiting chest pain abdominal pain.   Symptoms started yesterday patient started to develop nauseous followed by frequent vomiting of stomach content multiple times associated with burning sensation behind the chest and cramping-like epigastric pain, unable to take any food or water down whole day yesterday.  Denies any diarrhea, no fever or chills.  He admitted he drinks occasionally and last drink was 2 days ago.   ED Course: Afebrile, borderline tachycardia nonhypotensive nonhypoxic.  Blood work showed K2.8, bicarb 10, anion gap 20, creatinine 0.6, AST 163 bilirubin 3.5, WBC 23   CT abdomen pelvis with contrast showed no leakage or damage of AAA repair site"  8/15.  Patient had upper endoscopy done which showed grade D esophagitis, large hiatal hernia and single nonbleeding angiectasia in the stomach treated with argon plasma. 8/16.  Patient's hemoglobin dipped down to 6.5.  Spoke with wife and consented patient for blood transfusion.  Assessment and Plan: * Acute blood loss anemia Today's hemoglobin dropped down to 6.5.  1 unit of packed red blood cells transfused.  Consented wife for transfusion.  Acute upper GI bleed EGD on 8/15 shows grade D esophagitis, hiatal hernia and angiectasia which was treated with argon plasma coagulation.   Alcohol withdrawal (HCC) Mental status seems improved today as per nursing staff.  Nausea & vomiting Improved.  On full liquid diet.  Hypophosphatemia Replaced earlier in the hospital course.  Check again tomorrow.  Hypomagnesemia Replaced earlier in the hospital  course.  Check again tomorrow.  Hypokalemia Replace orally  Alcoholic ketoacidosis Resolved.  PVD (peripheral vascular disease) (HCC) Hold Plavix.  Continue atorvastatin.  Transaminitis Secondary to alcohol abuse.  Last AST 141, ALT 23, total bilirubin 2.8, alk phos 157.  Leukocytosis Resolved  Abdominal aortic aneurysm (AAA) (HCC) No endoleak on CTA on admission. Monitor.  Macrocytic anemia Last MCV down to 95.2.  Bilirubinemia .  Alcohol abuse .  Hyponatremia Last Na131        Subjective: Patient answers a few questions but not well enough to consent for blood.  Patient moving his extremities.  Physical Exam: Vitals:   03/26/23 1300 03/26/23 1326 03/26/23 1438 03/26/23 1535  BP: (!) 86/66  112/82 128/86  Pulse: (!) 101  (!) 104 (!) 105  Resp: 18  18 16   Temp: 98.5 F (36.9 C) 98.5 F (36.9 C) 98.2 F (36.8 C) 97.6 F (36.4 C)  TempSrc: Oral Oral Oral   SpO2: 98%  100% 98%  Weight:      Height:       Physical Exam HENT:     Head: Normocephalic.     Mouth/Throat:     Pharynx: No oropharyngeal exudate.  Eyes:     General: Lids are normal.     Conjunctiva/sclera: Conjunctivae normal.  Cardiovascular:     Rate and Rhythm: Normal rate and regular rhythm.     Heart sounds: Normal heart sounds, S1 normal and S2 normal.  Pulmonary:     Breath sounds: No decreased breath sounds, wheezing, rhonchi or rales.  Abdominal:     Palpations: Abdomen is soft.     Tenderness: There  is no abdominal tenderness.  Musculoskeletal:     Right lower leg: No swelling.     Left lower leg: No swelling.  Skin:    General: Skin is warm.     Findings: No rash.  Neurological:     Mental Status: He is alert.     Comments: Moves his extremities on his own.     Data Reviewed:  Hemoglobin 6.5, platelet count 180, white blood cell count 8.7, sodium 131, potassium 3.2, creatinine 0.53 Family Communication: Spoke with wife on the phone  Disposition: Status is:  Inpatient Remains inpatient appropriate because: Transfuse 1 unit of packed red blood cells today recheck hemoglobin tomorrow.  Planned Discharge Destination: To be determined    Time spent: 28 minutes  Author: Alford Highland, MD 03/26/2023 3:49 PM  For on call review [email or web address].

## 2023-03-26 NOTE — Assessment & Plan Note (Signed)
Transfused 1 unit of packed red blood cells on 8/16 on a hemoglobin of 6.5.  Last hemoglobin 9.7.

## 2023-03-27 DIAGNOSIS — K922 Gastrointestinal hemorrhage, unspecified: Secondary | ICD-10-CM | POA: Diagnosis not present

## 2023-03-27 DIAGNOSIS — R112 Nausea with vomiting, unspecified: Secondary | ICD-10-CM | POA: Diagnosis not present

## 2023-03-27 DIAGNOSIS — D62 Acute posthemorrhagic anemia: Secondary | ICD-10-CM | POA: Diagnosis not present

## 2023-03-27 DIAGNOSIS — F10931 Alcohol use, unspecified with withdrawal delirium: Secondary | ICD-10-CM

## 2023-03-27 LAB — CBC WITH DIFFERENTIAL/PLATELET
Abs Immature Granulocytes: 0.06 10*3/uL (ref 0.00–0.07)
Basophils Absolute: 0 10*3/uL (ref 0.0–0.1)
Basophils Relative: 0 %
Eosinophils Absolute: 0 10*3/uL (ref 0.0–0.5)
Eosinophils Relative: 0 %
HCT: 28.8 % — ABNORMAL LOW (ref 39.0–52.0)
Hemoglobin: 10.7 g/dL — ABNORMAL LOW (ref 13.0–17.0)
Immature Granulocytes: 1 %
Lymphocytes Relative: 6 %
Lymphs Abs: 0.6 10*3/uL — ABNORMAL LOW (ref 0.7–4.0)
MCH: 34.2 pg — ABNORMAL HIGH (ref 26.0–34.0)
MCHC: 37.2 g/dL — ABNORMAL HIGH (ref 30.0–36.0)
MCV: 92 fL (ref 80.0–100.0)
Monocytes Absolute: 1 10*3/uL (ref 0.1–1.0)
Monocytes Relative: 10 %
Neutro Abs: 8.6 10*3/uL — ABNORMAL HIGH (ref 1.7–7.7)
Neutrophils Relative %: 83 %
Platelets: 310 10*3/uL (ref 150–400)
RBC: 3.13 MIL/uL — ABNORMAL LOW (ref 4.22–5.81)
RDW: 16.7 % — ABNORMAL HIGH (ref 11.5–15.5)
WBC: 10.3 10*3/uL (ref 4.0–10.5)
nRBC: 0.7 % — ABNORMAL HIGH (ref 0.0–0.2)

## 2023-03-27 LAB — BPAM RBC
Blood Product Expiration Date: 202409082359
ISSUE DATE / TIME: 202408160958
Unit Type and Rh: 5100

## 2023-03-27 LAB — PHOSPHORUS: Phosphorus: 2.3 mg/dL — ABNORMAL LOW (ref 2.5–4.6)

## 2023-03-27 LAB — MAGNESIUM: Magnesium: 1.7 mg/dL (ref 1.7–2.4)

## 2023-03-27 LAB — TYPE AND SCREEN
ABO/RH(D): O POS
Antibody Screen: NEGATIVE
Unit division: 0

## 2023-03-27 LAB — BASIC METABOLIC PANEL
Anion gap: 8 (ref 5–15)
BUN: 7 mg/dL — ABNORMAL LOW (ref 8–23)
CO2: 23 mmol/L (ref 22–32)
Calcium: 7.8 mg/dL — ABNORMAL LOW (ref 8.9–10.3)
Chloride: 102 mmol/L (ref 98–111)
Creatinine, Ser: 0.47 mg/dL — ABNORMAL LOW (ref 0.61–1.24)
GFR, Estimated: >60 mL/min (ref >60)
Glucose, Bld: 119 mg/dL — ABNORMAL HIGH (ref 70–99)
Potassium: 3 mmol/L — ABNORMAL LOW (ref 3.5–5.1)
Sodium: 133 mmol/L — ABNORMAL LOW (ref 135–145)

## 2023-03-27 MED ORDER — MAGNESIUM OXIDE -MG SUPPLEMENT 400 (240 MG) MG PO TABS
400.0000 mg | ORAL_TABLET | Freq: Every day | ORAL | Status: DC
  Filled 2023-03-27: qty 1

## 2023-03-27 MED ORDER — K PHOS MONO-SOD PHOS DI & MONO 155-852-130 MG PO TABS
500.0000 mg | ORAL_TABLET | Freq: Two times a day (BID) | ORAL | Status: DC
  Administered 2023-03-27: 500 mg via ORAL
  Filled 2023-03-27 (×2): qty 2

## 2023-03-27 MED ORDER — POTASSIUM CHLORIDE 10 MEQ/100ML IV SOLN
10.0000 meq | INTRAVENOUS | Status: AC
  Administered 2023-03-27 (×2): 10 meq via INTRAVENOUS
  Filled 2023-03-27 (×2): qty 100

## 2023-03-27 MED ORDER — MAGNESIUM SULFATE 2 GM/50ML IV SOLN
2.0000 g | Freq: Once | INTRAVENOUS | Status: AC
  Administered 2023-03-27: 2 g via INTRAVENOUS
  Filled 2023-03-27: qty 50

## 2023-03-27 MED ORDER — POTASSIUM CHLORIDE CRYS ER 20 MEQ PO TBCR
40.0000 meq | EXTENDED_RELEASE_TABLET | ORAL | Status: DC
  Filled 2023-03-27: qty 2

## 2023-03-27 MED ORDER — QUETIAPINE FUMARATE 25 MG PO TABS
12.5000 mg | ORAL_TABLET | Freq: Every day | ORAL | Status: DC
  Administered 2023-03-27 – 2023-04-16 (×16): 12.5 mg via ORAL
  Filled 2023-03-27 (×20): qty 1

## 2023-03-27 MED ORDER — MAGNESIUM SULFATE 2 GM/50ML IV SOLN: 2.0000 g | Freq: Once | INTRAVENOUS | Status: DC

## 2023-03-27 MED ORDER — THIAMINE HCL 100 MG/ML IJ SOLN
500.0000 mg | Freq: Three times a day (TID) | INTRAVENOUS | Status: AC
  Administered 2023-03-27 – 2023-03-29 (×6): 500 mg via INTRAVENOUS
  Filled 2023-03-27 (×6): qty 5

## 2023-03-27 NOTE — Assessment & Plan Note (Addendum)
Will give high-dose thiamine to see if mental status will improve.  As needed Librium.  Right now patient does not have capacity make medical decisions.  Patient's wife will not have him return home with her.

## 2023-03-27 NOTE — Progress Notes (Signed)
Progress Note   Patient: Ryan Rose [id number] DOB: 22-Apr-1961 DOA: 03/21/2023     6 DOS: the patient was seen and examined on 03/27/2023   Brief hospital course: HPI on admission 03/22/23 by Dr. Chipper Herb: "[name] is a 62 y.o. male with medical history significant of AAA status post repair and stenting, alcohol abuse, CAD, HTN, PVD, presented with persistent nauseous vomiting chest pain abdominal pain.   Symptoms started yesterday patient started to develop nauseous followed by frequent vomiting of stomach content multiple times associated with burning sensation behind the chest and cramping-like epigastric pain, unable to take any food or water down whole day yesterday.  Denies any diarrhea, no fever or chills.  He admitted he drinks occasionally and last drink was 2 days ago.   ED Course: Afebrile, borderline tachycardia nonhypotensive nonhypoxic.  Blood work showed K2.8, bicarb 10, anion gap 20, creatinine 0.6, AST 163 bilirubin 3.5, WBC 23   CT abdomen pelvis with contrast showed no leakage or damage of AAA repair site"  8/15.  Patient had upper endoscopy done which showed grade D esophagitis, large hiatal hernia and single nonbleeding angiectasia in the stomach treated with argon plasma. 8/16.  Patient's hemoglobin dipped down to 6.5.  Spoke with wife and consented patient for blood transfusion. 8/17.  Hemoglobin up to 10.7, creatinine 0.47, potassium 3.0, sodium 133  Assessment and Plan: * Alcohol withdrawal delirium (HCC) Will give high-dose thiamine to see if mental status will improve.  As needed Librium.  Right now patient does not have capacity make medical decisions.  Patient's wife does not think he will be able to return home.  Acute blood loss anemia Transfuse 1 unit of packed red blood cells on 816 on a hemoglobin of 6.5.  Today's hemoglobin up at 10.7.  Acute upper GI bleed EGD on 8/15 shows grade D esophagitis, hiatal hernia and angiectasia which was  treated with argon plasma coagulation.   Nausea & vomiting Advance to solid food.  Patient did not eat this morning.  Hypophosphatemia Replace orally if he will take  Hypomagnesemia Replace IV  Hypokalemia Since patient refused oral medication will replace IV  Alcoholic ketoacidosis Resolved  PVD (peripheral vascular disease) (HCC) Hold Plavix.  Continue atorvastatin.  Transaminitis Secondary to alcohol abuse.  Last AST 141, ALT 23, total bilirubin 2.8, alk phos 157.  Leukocytosis Resolved  Abdominal aortic aneurysm (AAA) (HCC) No endoleak on CTA on admission. Monitor.  Macrocytic anemia MCV down to 92  Hyponatremia Last Na 133        Subjective: Patient states he is going home.  Did not work with physical therapy.  Not eating much.  Refused oral medications.  Admitted with dehydration and alcohol abuse.  Physical Exam: Vitals:   03/26/23 1535 03/26/23 2354 03/27/23 0934 03/27/23 1009  BP: 128/86 129/88 106/79   Pulse: (!) 105 (!) 103 (!) 123 89  Resp: 16 17 20    Temp: 97.6 F (36.4 C) 97.8 F (36.6 C) (!) 100.4 F (38 C)   TempSrc:  Oral    SpO2: 98% 99% 97%   Weight:      Height:       Physical Exam HENT:     Head: Normocephalic.     Mouth/Throat:     Pharynx: No oropharyngeal exudate.  Eyes:     General: Lids are normal.     Conjunctiva/sclera: Conjunctivae normal.  Cardiovascular:     Rate and Rhythm: Normal rate and regular rhythm.  Heart sounds: Normal heart sounds, S1 normal and S2 normal.  Pulmonary:     Breath sounds: No decreased breath sounds, wheezing, rhonchi or rales.  Abdominal:     Palpations: Abdomen is soft.     Tenderness: There is no abdominal tenderness.  Musculoskeletal:     Right lower leg: No swelling.     Left lower leg: No swelling.  Skin:    General: Skin is warm.     Findings: No rash.  Neurological:     Mental Status: He is alert.     Comments: Moves his arms on his own.     Data Reviewed: Sodium  133, potassium 3.0, creatinine 0.47, magnesium 1.7, hemoglobin 10.7, platelet count 310  Family Communication: Spoke with patient's wife on the phone  Disposition: Status is: Inpatient Remains inpatient appropriate because: Patient having altered mental status.  Will give high-dose IV thiamine to see if we can improve mental status.  Planned Discharge Destination: To be determined    Time spent: 30 minutes  Author: Alford Highland, MD 03/27/2023 3:16 PM  For on call review [email or web address].

## 2023-03-27 NOTE — Progress Notes (Signed)
PT Cancellation Note  Patient Details Name: Ryan Rose MRN: [id number] DOB: Sep 19, 1960   Cancelled Treatment:    Reason Eval/Treat Not Completed: Patient declined, no reason specified (PT eval attempted. Pateint drowsy and disorietned. Awakened enough to clearly decline PT several times, stating "I am not trying to do anything today." Will re-attempted at later time/date as appropriate.)  Luretha Murphy. Ilsa Iha, PT, DPT 03/27/23, 9:49 AM

## 2023-03-28 ENCOUNTER — Inpatient Hospital Stay: Payer: Commercial Managed Care - PPO

## 2023-03-28 DIAGNOSIS — R112 Nausea with vomiting, unspecified: Secondary | ICD-10-CM | POA: Diagnosis not present

## 2023-03-28 DIAGNOSIS — F10931 Alcohol use, unspecified with withdrawal delirium: Secondary | ICD-10-CM | POA: Diagnosis not present

## 2023-03-28 DIAGNOSIS — D62 Acute posthemorrhagic anemia: Secondary | ICD-10-CM | POA: Diagnosis not present

## 2023-03-28 DIAGNOSIS — J690 Pneumonitis due to inhalation of food and vomit: Secondary | ICD-10-CM

## 2023-03-28 DIAGNOSIS — K922 Gastrointestinal hemorrhage, unspecified: Secondary | ICD-10-CM | POA: Diagnosis not present

## 2023-03-28 HISTORY — DX: Pneumonitis due to inhalation of food and vomit: J69.0

## 2023-03-28 LAB — COMPREHENSIVE METABOLIC PANEL
ALT: 33 U/L (ref 0–44)
AST: 141 U/L — ABNORMAL HIGH (ref 15–41)
Albumin: 1.9 g/dL — ABNORMAL LOW (ref 3.5–5.0)
Alkaline Phosphatase: 185 U/L — ABNORMAL HIGH (ref 38–126)
Anion gap: 8 (ref 5–15)
BUN: 12 mg/dL (ref 8–23)
CO2: 23 mmol/L (ref 22–32)
Calcium: 7.4 mg/dL — ABNORMAL LOW (ref 8.9–10.3)
Chloride: 101 mmol/L (ref 98–111)
Creatinine, Ser: 0.5 mg/dL — ABNORMAL LOW (ref 0.61–1.24)
GFR, Estimated: >60 mL/min (ref >60)
Glucose, Bld: 102 mg/dL — ABNORMAL HIGH (ref 70–99)
Potassium: 3.2 mmol/L — ABNORMAL LOW (ref 3.5–5.1)
Sodium: 132 mmol/L — ABNORMAL LOW (ref 135–145)
Total Bilirubin: 4.5 mg/dL — ABNORMAL HIGH (ref 0.3–1.2)
Total Protein: 6 g/dL — ABNORMAL LOW (ref 6.5–8.1)

## 2023-03-28 LAB — HEMOGLOBIN: Hemoglobin: 9.7 g/dL — ABNORMAL LOW (ref 13.0–17.0)

## 2023-03-28 MED ORDER — POTASSIUM CHLORIDE 10 MEQ/100ML IV SOLN
10.0000 meq | INTRAVENOUS | Status: AC
  Administered 2023-03-28 (×2): 10 meq via INTRAVENOUS
  Filled 2023-03-28 (×2): qty 100

## 2023-03-28 MED ORDER — SODIUM CHLORIDE 0.9 % IV SOLN
3.0000 g | Freq: Four times a day (QID) | INTRAVENOUS | Status: DC
  Administered 2023-03-28 – 2023-03-29 (×6): 3 g via INTRAVENOUS
  Filled 2023-03-28 (×9): qty 8

## 2023-03-28 MED ORDER — SODIUM CHLORIDE 0.9 % IV BOLUS
500.0000 mL | Freq: Once | INTRAVENOUS | Status: AC
  Administered 2023-03-28: 500 mL via INTRAVENOUS

## 2023-03-28 MED ORDER — POTASSIUM CHLORIDE CRYS ER 20 MEQ PO TBCR
40.0000 meq | EXTENDED_RELEASE_TABLET | Freq: Once | ORAL | Status: DC
  Filled 2023-03-28: qty 2

## 2023-03-28 NOTE — Progress Notes (Addendum)
Progress Note   Patient: Ryan Rose [id number]:[id number] DOB: 1961/02/22 DOA: 03/21/2023     7 DOS: the patient was seen and examined on 03/28/2023   Brief hospital course: HPI on admission 03/22/23 by Dr. Chipper Herb: "CONTRELL EVARISTO is a 62 y.o. male with medical history significant of AAA status post repair and stenting, alcohol abuse, CAD, HTN, PVD, presented with persistent nauseous vomiting chest pain abdominal pain.   Symptoms started yesterday patient started to develop nauseous followed by frequent vomiting of stomach content multiple times associated with burning sensation behind the chest and cramping-like epigastric pain, unable to take any food or water down whole day yesterday.  Denies any diarrhea, no fever or chills.  He admitted he drinks occasionally and last drink was 2 days ago.   ED Course: Afebrile, borderline tachycardia nonhypotensive nonhypoxic.  Blood work showed K2.8, bicarb 10, anion gap 20, creatinine 0.6, AST 163 bilirubin 3.5, WBC 23   CT abdomen pelvis with contrast showed no leakage or damage of AAA repair site"  8/15.  Patient had upper endoscopy done which showed grade D esophagitis, large hiatal hernia and single nonbleeding angiectasia in the stomach treated with argon plasma. 8/16.  Patient's hemoglobin dipped down to 6.5.  Spoke with wife and consented patient for blood transfusion. 8/17.  Hemoglobin up to 10.7, creatinine 0.47, potassium 3.0, sodium 133 8/18.  Patient refused medications and not eating much.  Hemoglobin 9.7, potassium 3.2, total bilirubin 4.5, sodium 132.  Chest x-ray possible aspiration pneumonia.  Assessment and Plan: * Alcohol withdrawal delirium (HCC) Will give high-dose thiamine to see if mental status will improve.  As needed Librium.  Right now patient does not have capacity make medical decisions.  Patient's wife will not have him return home with her.  Acute blood loss anemia Transfuse 1 unit of packed red blood cells on 816 on a  hemoglobin of 6.5.  Today's hemoglobin 9.7  Acute upper GI bleed EGD on 8/15 shows grade D esophagitis, hiatal hernia and angiectasia which was treated with argon plasma coagulation.   Aspiration pneumonia [facility]) Patient had fever and altered mental status.  Empirically start Unasyn since refusing oral medications.  Nausea & vomiting Advance to solid food.  Patient did not eat this morning.  Hypophosphatemia Ordered for replacement yesterday.  Hypomagnesemia Replaced  Hypokalemia Replace IV since refused oral.  Alcoholic ketoacidosis Resolved  PVD (peripheral vascular disease) (HCC) Hold Plavix.  Continue atorvastatin.  Transaminitis Secondary to alcohol abuse.  Last AST 141, last total bilirubin 4.5 last alkaline phosphatase 185 last albumin 1.9.  Leukocytosis Resolved  Abdominal aortic aneurysm (AAA) (HCC) No endoleak on CTA on admission. Monitor.  Macrocytic anemia MCV down to 92  Hyponatremia Last Na 132        Subjective: Patient this morning was asking for coffee.  I was able to give a few sips of coffee and asked nursing staff to help feed him.  Admitted with alcohol abuse  Physical Exam: Vitals:   03/27/23 2044 03/28/23 0611 03/28/23 1009 03/28/23 1410  BP: 98/70 97/66 93/69  95/70  Pulse: (!) 106 (!) 115 (!) 118 (!) 104  Resp: 16 16 18 18   Temp: 98.2 F (36.8 C) 98.7 F (37.1 C) 98 F (36.7 C) 98 F (36.7 C)  TempSrc: Oral Oral    SpO2: 96% 96% 98% 95%  Weight:      Height:       Physical Exam HENT:     Head: Normocephalic.  Mouth/Throat:     Pharynx: No oropharyngeal exudate.  Eyes:     General: Lids are normal.     Conjunctiva/sclera: Conjunctivae normal.  Cardiovascular:     Rate and Rhythm: Normal rate and regular rhythm.     Heart sounds: Normal heart sounds, S1 normal and S2 normal.  Pulmonary:     Breath sounds: No decreased breath sounds, wheezing, rhonchi or rales.  Abdominal:     Palpations: Abdomen is soft.      Tenderness: There is no abdominal tenderness.  Musculoskeletal:     Right lower leg: No swelling.     Left lower leg: No swelling.  Skin:    General: Skin is warm.     Findings: No rash.  Neurological:     Mental Status: He is alert.     Comments: Moves his arms on his own.     Data Reviewed: Sodium 132, potassium 3.2, creatinine 0.5, total bilirubin 4.5, AST 141, ALT 33, albumin 1.9, alkaline phosphatase 185, hemoglobin 9.7   Disposition: Status is: Inpatient Remains inpatient appropriate because: Patient not participating in physical therapy, patient not eating much.  He will be a difficult disposition.  Will start antibiotics for possible aspiration pneumonia.  Planned Discharge Destination: To be determined    Time spent: 28 minutes  Author: Alford Highland, MD 03/28/2023 2:28 PM  For on call review [email or web address].

## 2023-03-28 NOTE — Progress Notes (Signed)
PT Cancellation Note  Patient Details Name: Ryan Rose MRN: [id number] DOB: Feb 22, 1961   Cancelled Treatment:    Pt did not complete PT treatment and evaluation in the AM 2/2 as per nurse pt's resting HR 110 BPM and needs to receive Bolus to improve pt symptoms so to try to attempt later if pt is cooperative. Pt remains very confused and yelling Nurse's name. Pt deferred as per Nurse recommendations and will attempt later if pt is appropriate with resting HR WFL.  Janet Berlin PT DPT 12:58 PM,03/28/23

## 2023-03-28 NOTE — Plan of Care (Signed)

## 2023-03-28 NOTE — Assessment & Plan Note (Signed)
Unasyn switched over to Maxipime and Flagyl.

## 2023-03-28 NOTE — Progress Notes (Signed)
Pharmacy Antibiotic Note  Ryan Rose is a 62 y.o. male admitted on 03/21/2023 with aspiration pneumonia.  Pharmacy has been consulted for Unasyn dosing.  Plan: Unasyn (ampicillin/sulbactam) 3 grams IV every 6 hours   Height: 5' 8.5" (174 cm) Weight: 63.2 kg (139 lb 5.3 oz) IBW/kg (Calculated) : 69.55  Temp (24hrs), Avg:98.2 F (36.8 C), Min:98 F (36.7 C), Max:98.7 F (37.1 C)  Recent Labs  Lab 03/23/23 0352 03/24/23 0309 03/25/23 0335 03/26/23 0536 03/27/23 0500 03/28/23 0644  WBC 6.1 5.0 6.4 8.7 10.3  --   CREATININE 0.34* 0.40* 0.47* 0.53* 0.47* 0.50*    Estimated Creatinine Clearance: 85.6 mL/min (A) (by C-G formula based on SCr of 0.5 mg/dL (L)).    Allergies  Allergen Reactions   Sulfa Antibiotics Hives    Antimicrobials this admission: Unasyn 8/18 >>  Dose adjustments this admission: N/a  Microbiology results: N/a  Thank you for allowing pharmacy to be a part of this patient's care.    Elliot Gurney, PharmD, BCPS Clinical Pharmacist  03/28/2023 2:42 PM

## 2023-03-29 DIAGNOSIS — J690 Pneumonitis due to inhalation of food and vomit: Secondary | ICD-10-CM | POA: Diagnosis not present

## 2023-03-29 DIAGNOSIS — F10931 Alcohol use, unspecified with withdrawal delirium: Secondary | ICD-10-CM | POA: Diagnosis not present

## 2023-03-29 DIAGNOSIS — D62 Acute posthemorrhagic anemia: Secondary | ICD-10-CM | POA: Diagnosis not present

## 2023-03-29 DIAGNOSIS — K922 Gastrointestinal hemorrhage, unspecified: Secondary | ICD-10-CM | POA: Diagnosis not present

## 2023-03-29 LAB — URINALYSIS, COMPLETE (UACMP) WITH MICROSCOPIC
Bacteria, UA: NONE SEEN
Glucose, UA: NEGATIVE mg/dL
Hgb urine dipstick: NEGATIVE
Ketones, ur: NEGATIVE mg/dL
Leukocytes,Ua: NEGATIVE
Nitrite: NEGATIVE
Protein, ur: 30 mg/dL — AB
Specific Gravity, Urine: 1.035 — ABNORMAL HIGH (ref 1.005–1.030)
pH: 5 (ref 5.0–8.0)

## 2023-03-29 LAB — CK: Total CK: 69 U/L (ref 49–397)

## 2023-03-29 LAB — VITAMIN B1: Vitamin B1 (Thiamine): 59 nmol/L — ABNORMAL LOW (ref 66.5–200.0)

## 2023-03-29 MED ORDER — LORAZEPAM 2 MG/ML IJ SOLN
1.0000 mg | INTRAMUSCULAR | Status: AC | PRN
  Administered 2023-03-29 – 2023-03-30 (×2): 1 mg via INTRAVENOUS
  Filled 2023-03-29 (×2): qty 1

## 2023-03-29 MED ORDER — POTASSIUM CHLORIDE CRYS ER 20 MEQ PO TBCR
20.0000 meq | EXTENDED_RELEASE_TABLET | Freq: Once | ORAL | Status: AC
  Administered 2023-03-29: 20 meq via ORAL
  Filled 2023-03-29 (×2): qty 1

## 2023-03-29 MED ORDER — NICOTINE 14 MG/24HR TD PT24
14.0000 mg | MEDICATED_PATCH | Freq: Every day | TRANSDERMAL | Status: DC
  Administered 2023-03-31 – 2023-04-23 (×13): 14 mg via TRANSDERMAL
  Filled 2023-03-29 (×20): qty 1

## 2023-03-29 MED ORDER — POTASSIUM CHLORIDE IN NACL 20-0.9 MEQ/L-% IV SOLN
INTRAVENOUS | Status: DC
  Filled 2023-03-29 (×2): qty 1000

## 2023-03-29 MED ORDER — THIAMINE HCL 100 MG/ML IJ SOLN
100.0000 mg | Freq: Every day | INTRAMUSCULAR | Status: DC
  Administered 2023-03-30 – 2023-04-04 (×6): 100 mg via INTRAVENOUS
  Filled 2023-03-29 (×6): qty 2

## 2023-03-29 NOTE — Plan of Care (Signed)

## 2023-03-29 NOTE — TOC Progression Note (Signed)
Transition of Care [facility]) - Progression Note    Patient Details  Name: Ryan Rose MRN: [id number] Date of Birth: 1961-07-22  Transition of Care [facility]) CM/SW Contact  Allena Katz, LCSW Phone Number: 03/29/2023, 4:13 PM  Clinical Narrative:  Ex wife reports to MD she does not want to make decisions for patient and would like pt's sister to. Wife is reaching out to sister to see if she is willing.          Expected Discharge Plan and Services                                               Social Determinants of Health (SDOH) Interventions SDOH Screenings   Food Insecurity: Patient Declined (03/21/2023)  Housing: Patient Declined (03/21/2023)  Transportation Needs: No Transportation Needs (03/21/2023)  Utilities: Not At Risk (03/21/2023)  Tobacco Use: High Risk (03/25/2023)    Readmission Risk Interventions     No data to display

## 2023-03-29 NOTE — Consult Note (Signed)
Pharmacy Antibiotic Note  Ryan Rose is a 62 y.o. male admitted on 03/21/2023 with alcohol withdrawal. Patient develop fever and altered mental status (8/18). Empirically started on antibiotics due to chest x-ray with possible aspiration pneumonia. Pharmacy has been consulted for Unasyn dosing.  Plan: Unasyn (ampicillin/sulbactam) 3 grams IV every 6 hours   Height: 5' 8.5" (174 cm) Weight: 64.9 kg (143 lb 1.3 oz) IBW/kg (Calculated) : 69.55  Temp (24hrs), Avg:98.3 F (36.8 C), Min:98.2 F (36.8 C), Max:98.5 F (36.9 C)  Recent Labs  Lab 03/23/23 0352 03/24/23 0309 03/25/23 0335 03/26/23 0536 03/27/23 0500 03/28/23 0644  WBC 6.1 5.0 6.4 8.7 10.3  --   CREATININE 0.34* 0.40* 0.47* 0.53* 0.47* 0.50*    Estimated Creatinine Clearance: 87.9 mL/min (A) (by C-G formula based on SCr of 0.5 mg/dL (L)).    Allergies  Allergen Reactions   Sulfa Antibiotics Hives    Antimicrobials this admission: Unasyn 8/18 >>  Dose adjustments this admission: N/a  Microbiology results: N/a  Thank you for allowing pharmacy to be a part of this patient's care.  Gara Kroner PGY1 Pharmacy Resident 03/29/2023 5:14 PM

## 2023-03-29 NOTE — Evaluation (Signed)
Physical Therapy Evaluation Patient Details Name: Ryan Rose MRN: [id number] DOB: 10/24/1960 Today's Date: 03/29/2023  History of Present Illness  Pt is a 62 y/o M admitted on 03/21/23 after presenting with c/c of N&V, chest & abdominal pain. Pt is being treated for alcohol withdrawal delirium, ABLA, acute upper GI bleed. PMH: AAA s/p repair & stenting, alcohol abuse, CAD, HTN, PVD  Clinical Impression  Pt seen for PT evaluation with pt agreeable to tx with encouragement. Pt with poor cognition (orientation, awareness, safety awareness, problem solving) & poor initiation & ability to follow commands. Pt requires max<>total assist for bed mobility, extra time to initiate STS but is able to clear buttocks & come to standing (unable to shift pelvis anteriorly & come to full upright standing) for a few seconds with BUE HHA. Pt with incontinent BM & requires total assist for peri hygiene at bed level. Pt is poor historian so unsure of home set up or PLOF, no family present during session (pt did report he lives with his brother).  At this time, recommend ongoing PT services to address strengthening, balance, endurance, to increase independence with bed mobility, transfers, & gait.      If plan is discharge home, recommend the following: Two people to help with walking and/or transfers;Two people to help with bathing/dressing/bathroom;Help with stairs or ramp for entrance;Direct supervision/assist for medications management;Assist for transportation;Direct supervision/assist for financial management;Assistance with cooking/housework   Can travel by private vehicle   No    Equipment Recommendations Other (comment) (defer to next veneu)  Recommendations for Other Services       Functional Status Assessment Patient has had a recent decline in their functional status and demonstrates the ability to make significant improvements in function in a reasonable and predictable amount of time.      Precautions / Restrictions Precautions Precautions: Fall Restrictions Weight Bearing Restrictions: No      Mobility  Bed Mobility Overal bed mobility: Needs Assistance Bed Mobility: Rolling, Sit to Supine, Supine to Sit Rolling: Max assist, Used rails (rolling L)   Supine to sit: Total assist, Used rails, HOB elevated Sit to supine: Max assist, HOB elevated, Used rails   General bed mobility comments: +2 to scoot to [facility]    Transfers Overall transfer level: Needs assistance Equipment used: 2 person hand held assist Transfers: Sit to/from Stand Sit to Stand: Max assist, +2 physical assistance           General transfer comment: STS from EOB    Ambulation/Gait                  Stairs            Wheelchair Mobility     Tilt Bed    Modified Rankin (Stroke Patients Only)       Balance Overall balance assessment: Needs assistance Sitting-balance support: Feet supported, Bilateral upper extremity supported Sitting balance-Leahy Scale: Poor Sitting balance - Comments: decreased awareness, requires cuing to correct Postural control: Right lateral lean, Posterior lean Standing balance support: Bilateral upper extremity supported Standing balance-Leahy Scale: Zero                               Pertinent Vitals/Pain Pain Assessment Pain Assessment: Faces Faces Pain Scale: No hurt    Home Living Family/patient expects to be discharged to:: Unsure  Prior Function Prior Level of Function : Patient poor historian/Family not available                     Extremity/Trunk Assessment   Upper Extremity Assessment Upper Extremity Assessment: Generalized weakness;Difficult to assess due to impaired cognition    Lower Extremity Assessment Lower Extremity Assessment: Generalized weakness;Difficult to assess due to impaired cognition       Communication   Communication Communication: No apparent  difficulties  Cognition Arousal: Alert Behavior During Therapy: WFL for tasks assessed/performed Overall Cognitive Status: No family/caregiver present to determine baseline cognitive functioning Area of Impairment: Orientation, Attention, Memory, Following commands, Safety/judgement, Problem solving, Awareness                 Orientation Level: Disoriented to, Time, Situation (reports he's at [facility])   Memory: Decreased recall of precautions, Decreased short-term memory Following Commands: Follows one step commands inconsistently, Follows one step commands with increased time Safety/Judgement: Decreased awareness of safety, Decreased awareness of deficits Awareness: Intellectual Problem Solving: Decreased initiation, Difficulty sequencing, Requires verbal cues, Requires tactile cues, Slow processing General Comments: Internally distracted        General Comments General comments (skin integrity, edema, etc.): Pt with incontinent BM, requires total assist for peri hygiene from bed level.    Exercises     Assessment/Plan    PT Assessment Patient needs continued PT services  PT Problem List Decreased strength;Decreased cognition;Decreased activity tolerance;Decreased balance;Decreased mobility;Decreased safety awareness;Decreased knowledge of use of DME       PT Treatment Interventions DME instruction;Balance training;Gait training;Neuromuscular re-education;Stair training;Functional mobility training;Patient/family education;Therapeutic activities;Manual techniques;Therapeutic exercise;Cognitive remediation;Modalities    PT Goals (Current goals can be found in the Care Plan section)  Acute Rehab PT Goals Patient Stated Goal: none stated PT Goal Formulation: With patient Time For Goal Achievement: 04/12/23 Potential to Achieve Goals: Fair    Frequency Min 1X/week     Co-evaluation               AM-PAC PT "6 Clicks" Mobility  Outcome Measure Help needed turning  from your back to your side while in a flat bed without using bedrails?: A Lot Help needed moving from lying on your back to sitting on the side of a flat bed without using bedrails?: Total Help needed moving to and from a bed to a chair (including a wheelchair)?: Total Help needed standing up from a chair using your arms (e.g., wheelchair or bedside chair)?: Total Help needed to walk in hospital room?: Total Help needed climbing 3-5 steps with a railing? : Total 6 Click Score: 7    End of Session   Activity Tolerance: Patient tolerated treatment well Patient left: in bed;with call bell/phone within reach;with bed alarm set (set up with meal tray) Nurse Communication: Mobility status PT Visit Diagnosis: Muscle weakness (generalized) (M62.81);Difficulty in walking, not elsewhere classified (R26.2);Other abnormalities of gait and mobility (R26.89)    Time: 1610-9604 PT Time Calculation (min) (ACUTE ONLY): 17 min   Charges:   PT Evaluation $PT Eval Moderate Complexity: 1 Mod   PT General Charges $$ ACUTE PT VISIT: 1 Visit         Aleda Grana, PT, DPT 03/29/23, 10:28 AM   Sandi Mariscal 03/29/2023, 10:27 AM

## 2023-03-29 NOTE — Progress Notes (Signed)
Progress Note   Patient: Ryan Rose ZOX:[id number] DOB: 09/20/1960 DOA: 03/21/2023     8 DOS: the patient was seen and examined on 03/29/2023   Brief hospital course: HPI on admission 03/22/23 by Dr. Chipper Herb: "GEORDY LEVE is a 62 y.o. male with medical history significant of AAA status post repair and stenting, alcohol abuse, CAD, HTN, PVD, presented with persistent nauseous vomiting chest pain abdominal pain.   Symptoms started yesterday patient started to develop nauseous followed by frequent vomiting of stomach content multiple times associated with burning sensation behind the chest and cramping-like epigastric pain, unable to take any food or water down whole day yesterday.  Denies any diarrhea, no fever or chills.  He admitted he drinks occasionally and last drink was 2 days ago.   ED Course: Afebrile, borderline tachycardia nonhypotensive nonhypoxic.  Blood work showed K2.8, bicarb 10, anion gap 20, creatinine 0.6, AST 163 bilirubin 3.5, WBC 23   CT abdomen pelvis with contrast showed no leakage or damage of AAA repair site"  8/15.  Patient had upper endoscopy done which showed grade D esophagitis, large hiatal hernia and single nonbleeding angiectasia in the stomach treated with argon plasma. 8/16.  Patient's hemoglobin dipped down to 6.5.  Spoke with wife and consented patient for blood transfusion. 8/17.  Hemoglobin up to 10.7, creatinine 0.47, potassium 3.0, sodium 133 8/18.  Patient refused medications and not eating much.  Hemoglobin 9.7, potassium 3.2, total bilirubin 4.5, sodium 132.  Chest x-ray possible aspiration pneumonia. 8/19.  Patient actually able to participate with physical therapy today.  Assessment and Plan: * Alcohol withdrawal delirium (HCC) Received 2 days of high-dose thiamine.  As needed Librium.  Right now patient does not have capacity make medical decisions.  Patient's wife will not have him return home with her.  Mental status slowly  improving.  Acute blood loss anemia Transfused 1 unit of packed red blood cells on 8/16 on a hemoglobin of 6.5.  Last hemoglobin 9.7.  Acute upper GI bleed EGD on 8/15 shows grade D esophagitis, hiatal hernia and angiectasia which was treated with argon plasma coagulation.   Aspiration pneumonia [facility]) Patient had fever and altered mental status.  Empirically started on Unasyn since chest x-ray shows possible pneumonia.  Nausea & vomiting Advanced to solid food.  Patient did not eat this morning.  Hypophosphatemia Check labs tomorrow  Hypomagnesemia Check labs tomorrow  Hypokalemia Check labs tomorrow  Alcoholic ketoacidosis Resolved  PVD (peripheral vascular disease) (HCC) Hold Plavix.  Continue atorvastatin.  Transaminitis Secondary to alcohol abuse.  Last AST 141, last total bilirubin 4.5 last alkaline phosphatase 185 last albumin 1.9.  Leukocytosis Resolved  Abdominal aortic aneurysm (AAA) (HCC) No endoleak on CTA on admission. Monitor.  Macrocytic anemia MCV down to 92  Hyponatremia Last Na 132        Subjective: Patient able to hold the better conversation today than previous.  Able to follow commands and straight leg raise.  I advised him he needs to eat and work with physical therapy.  Admitted with alcohol withdrawal and nausea or vomiting.  Physical Exam: Vitals:   03/29/23 0043 03/29/23 0441 03/29/23 0623 03/29/23 0755  BP: 97/77 102/70  103/80  Pulse: 100 (!) 109  (!) 107  Resp: 18 18  20   Temp: 98.2 F (36.8 C) 98.2 F (36.8 C)  98.5 F (36.9 C)  TempSrc: Oral Oral  Oral  SpO2: 98% 95%  98%  Weight:   64.9 kg   Height:  Physical Exam HENT:     Head: Normocephalic.     Mouth/Throat:     Pharynx: No oropharyngeal exudate.  Eyes:     General: Lids are normal.     Conjunctiva/sclera: Conjunctivae normal.  Cardiovascular:     Rate and Rhythm: Regular rhythm. Tachycardia present.     Heart sounds: Normal heart sounds, S1 normal  and S2 normal.  Pulmonary:     Breath sounds: No decreased breath sounds, wheezing, rhonchi or rales.  Abdominal:     Palpations: Abdomen is soft.     Tenderness: There is no abdominal tenderness.  Musculoskeletal:     Right lower leg: No swelling.     Left lower leg: No swelling.  Skin:    General: Skin is warm.     Findings: No rash.  Neurological:     Mental Status: He is alert.     Comments: Patient able to straight leg raise to command and follow simple commands.  Able to hold more of a conversation today.     Data Reviewed: Chest x-ray showing mild bibasilar opacities which could represent infection.  Family Communication: Spoke with patient's wife on the phone.  She will speak with the patient's sisters to see if any of them would want to be the patient's main contact for medical issues.  Disposition: Status is: Inpatient Remains inpatient appropriate because: Patient will be unable to go home at this point.  Patient slowly improving from alcohol withdrawal delirium.  Planned Discharge Destination: Skilled nursing facility    Time spent: 27 minutes  Author: Alford Highland, MD 03/29/2023 4:30 PM  For on call review [email or web address].

## 2023-03-29 NOTE — Progress Notes (Signed)
On call provider notification 2301  S:  Request medication intervention for agitation, delirium  B:  62 y.o. male admitted on 03/21/2023 with alcohol withdrawal. Patient develop fever and altered mental status (8/18). Started on antibiotics due to chest x-ray with possible aspiration pneumonia.   A:  Patient confused and irritable.. Currently calling 911 from personal cell phone saying he's hostage and kidnapped.  Unwilling to release phone to staff.   High fall risk, constantly setting off bed alarm attempting to get up to leave.  Max assist per PT  R: Ativan 1mg  IV once  2318-New order received and Ativan 1 mg IV administered.  Noted effective upon reassessment.  Eyes closed, calm, rise and fall of chest visualized.

## 2023-03-30 ENCOUNTER — Inpatient Hospital Stay: Payer: Commercial Managed Care - PPO

## 2023-03-30 DIAGNOSIS — K922 Gastrointestinal hemorrhage, unspecified: Secondary | ICD-10-CM | POA: Diagnosis not present

## 2023-03-30 DIAGNOSIS — A419 Sepsis, unspecified organism: Secondary | ICD-10-CM | POA: Diagnosis not present

## 2023-03-30 DIAGNOSIS — F10931 Alcohol use, unspecified with withdrawal delirium: Secondary | ICD-10-CM | POA: Diagnosis not present

## 2023-03-30 DIAGNOSIS — R6520 Severe sepsis without septic shock: Secondary | ICD-10-CM | POA: Diagnosis not present

## 2023-03-30 DIAGNOSIS — D62 Acute posthemorrhagic anemia: Secondary | ICD-10-CM | POA: Diagnosis not present

## 2023-03-30 DIAGNOSIS — I959 Hypotension, unspecified: Secondary | ICD-10-CM

## 2023-03-30 LAB — CBC
HCT: 27.1 % — ABNORMAL LOW (ref 39.0–52.0)
HCT: 23 % — ABNORMAL LOW (ref 39.0–52.0)
Hemoglobin: 9.1 g/dL — ABNORMAL LOW (ref 13.0–17.0)
Hemoglobin: 7.8 g/dL — ABNORMAL LOW (ref 13.0–17.0)
MCH: 32.5 pg (ref 26.0–34.0)
MCH: 32.9 pg (ref 26.0–34.0)
MCHC: 33.6 g/dL (ref 30.0–36.0)
MCHC: 33.9 g/dL (ref 30.0–36.0)
MCV: 96.8 fL (ref 80.0–100.0)
MCV: 97 fL (ref 80.0–100.0)
Platelets: 380 10*3/uL (ref 150–400)
Platelets: 209 10*3/uL (ref 150–400)
RBC: 2.8 MIL/uL — ABNORMAL LOW (ref 4.22–5.81)
RBC: 2.37 MIL/uL — ABNORMAL LOW (ref 4.22–5.81)
RDW: 16.8 % — ABNORMAL HIGH (ref 11.5–15.5)
RDW: 17.1 % — ABNORMAL HIGH (ref 11.5–15.5)
WBC: 14 10*3/uL — ABNORMAL HIGH (ref 4.0–10.5)
WBC: 13.7 10*3/uL — ABNORMAL HIGH (ref 4.0–10.5)
nRBC: 0 % (ref 0.0–0.2)
nRBC: 0 % (ref 0.0–0.2)

## 2023-03-30 LAB — COMPREHENSIVE METABOLIC PANEL
ALT: 29 U/L (ref 0–44)
ALT: 28 U/L (ref 0–44)
AST: 126 U/L — ABNORMAL HIGH (ref 15–41)
AST: 117 U/L — ABNORMAL HIGH (ref 15–41)
Albumin: 1.6 g/dL — ABNORMAL LOW (ref 3.5–5.0)
Albumin: 1.6 g/dL — ABNORMAL LOW (ref 3.5–5.0)
Alkaline Phosphatase: 224 U/L — ABNORMAL HIGH (ref 38–126)
Alkaline Phosphatase: 205 U/L — ABNORMAL HIGH (ref 38–126)
Anion gap: 8 (ref 5–15)
Anion gap: 9 (ref 5–15)
BUN: 5 mg/dL — ABNORMAL LOW (ref 8–23)
BUN: 5 mg/dL — ABNORMAL LOW (ref 8–23)
CO2: 24 mmol/L (ref 22–32)
CO2: 19 mmol/L — ABNORMAL LOW (ref 22–32)
Calcium: 7.3 mg/dL — ABNORMAL LOW (ref 8.9–10.3)
Calcium: 6.9 mg/dL — ABNORMAL LOW (ref 8.9–10.3)
Chloride: 105 mmol/L (ref 98–111)
Chloride: 105 mmol/L (ref 98–111)
Creatinine, Ser: 0.43 mg/dL — ABNORMAL LOW (ref 0.61–1.24)
Creatinine, Ser: 0.35 mg/dL — ABNORMAL LOW (ref 0.61–1.24)
GFR, Estimated: >60 mL/min (ref >60)
GFR, Estimated: >60 mL/min (ref >60)
Glucose, Bld: 81 mg/dL (ref 70–99)
Glucose, Bld: 72 mg/dL (ref 70–99)
Potassium: 3.3 mmol/L — ABNORMAL LOW (ref 3.5–5.1)
Potassium: 2.8 mmol/L — ABNORMAL LOW (ref 3.5–5.1)
Sodium: 137 mmol/L (ref 135–145)
Sodium: 133 mmol/L — ABNORMAL LOW (ref 135–145)
Total Bilirubin: 5.5 mg/dL — ABNORMAL HIGH (ref 0.3–1.2)
Total Bilirubin: 5.1 mg/dL — ABNORMAL HIGH (ref 0.3–1.2)
Total Protein: 5.9 g/dL — ABNORMAL LOW (ref 6.5–8.1)
Total Protein: 5.5 g/dL — ABNORMAL LOW (ref 6.5–8.1)

## 2023-03-30 LAB — BASIC METABOLIC PANEL
Anion gap: 10 (ref 5–15)
BUN: 5 mg/dL — ABNORMAL LOW (ref 8–23)
CO2: 22 mmol/L (ref 22–32)
Calcium: 6.9 mg/dL — ABNORMAL LOW (ref 8.9–10.3)
Chloride: 101 mmol/L (ref 98–111)
Creatinine, Ser: 0.42 mg/dL — ABNORMAL LOW (ref 0.61–1.24)
GFR, Estimated: >60 mL/min (ref >60)
Glucose, Bld: 90 mg/dL (ref 70–99)
Potassium: 3 mmol/L — ABNORMAL LOW (ref 3.5–5.1)
Sodium: 133 mmol/L — ABNORMAL LOW (ref 135–145)

## 2023-03-30 LAB — BLOOD GAS, ARTERIAL
Acid-base deficit: 0.2 mmol/L (ref 0.0–2.0)
Bicarbonate: 22.6 mmol/L (ref 20.0–28.0)
FIO2: 0.21 %
O2 Saturation: 95.1 %
Patient temperature: 37
pCO2 arterial: 31 mmHg — ABNORMAL LOW (ref 32–48)
pH, Arterial: 7.47 — ABNORMAL HIGH (ref 7.35–7.45)
pO2, Arterial: 65 mmHg — ABNORMAL LOW (ref 83–108)

## 2023-03-30 LAB — PHOSPHORUS: Phosphorus: 2.9 mg/dL (ref 2.5–4.6)

## 2023-03-30 LAB — PROTIME-INR
INR: 1.3 — ABNORMAL HIGH (ref 0.8–1.2)
Prothrombin Time: 15.9 seconds — ABNORMAL HIGH (ref 11.4–15.2)

## 2023-03-30 LAB — LACTIC ACID, PLASMA
Lactic Acid, Venous: 1.4 mmol/L (ref 0.5–1.9)
Lactic Acid, Venous: 2.5 mmol/L (ref 0.5–1.9)
Lactic Acid, Venous: 1.2 mmol/L (ref 0.5–1.9)
Lactic Acid, Venous: 1.5 mmol/L (ref 0.5–1.9)

## 2023-03-30 LAB — MRSA NEXT GEN BY PCR, NASAL: MRSA by PCR Next Gen: NOT DETECTED

## 2023-03-30 LAB — LIPASE, BLOOD: Lipase: 49 U/L (ref 11–51)

## 2023-03-30 LAB — PROCALCITONIN: Procalcitonin: 1.64 ng/mL

## 2023-03-30 LAB — GLUCOSE, CAPILLARY: Glucose-Capillary: 87 mg/dL (ref 70–99)

## 2023-03-30 LAB — MAGNESIUM: Magnesium: 2.2 mg/dL (ref 1.7–2.4)

## 2023-03-30 MED ORDER — ALBUMIN HUMAN 25 % IV SOLN
25.0000 g | Freq: Once | INTRAVENOUS | Status: DC
Start: 2023-03-30 — End: 2023-03-30

## 2023-03-30 MED ORDER — SODIUM CHLORIDE 0.9 % IV SOLN
2.0000 g | Freq: Three times a day (TID) | INTRAVENOUS | Status: DC
  Administered 2023-03-30 – 2023-04-04 (×16): 2 g via INTRAVENOUS
  Filled 2023-03-30 (×19): qty 12.5

## 2023-03-30 MED ORDER — POTASSIUM CHLORIDE 10 MEQ/100ML IV SOLN
10.0000 meq | INTRAVENOUS | Status: AC
  Administered 2023-03-30 (×2): 10 meq via INTRAVENOUS
  Filled 2023-03-30: qty 100

## 2023-03-30 MED ORDER — MIDODRINE HCL 5 MG PO TABS
5.0000 mg | ORAL_TABLET | Freq: Three times a day (TID) | ORAL | Status: DC
  Filled 2023-03-30 (×2): qty 1

## 2023-03-30 MED ORDER — MIDODRINE HCL 5 MG PO TABS
10.0000 mg | ORAL_TABLET | Freq: Three times a day (TID) | ORAL | Status: DC
  Administered 2023-03-31 – 2023-04-04 (×7): 10 mg via ORAL
  Filled 2023-03-30 (×9): qty 2

## 2023-03-30 MED ORDER — LACTATED RINGERS IV SOLN
150.0000 mL/h | INTRAVENOUS | Status: DC
  Administered 2023-03-30: 150 mL/h via INTRAVENOUS

## 2023-03-30 MED ORDER — LACTATED RINGERS IV BOLUS (SEPSIS)
1000.0000 mL | Freq: Once | INTRAVENOUS | Status: AC
  Administered 2023-03-30: 1000 mL via INTRAVENOUS

## 2023-03-30 MED ORDER — GADOBUTROL 1 MMOL/ML IV SOLN: 6.0000 mL | Freq: Once | INTRAVENOUS | Status: DC | PRN

## 2023-03-30 MED ORDER — POTASSIUM CHLORIDE 10 MEQ/100ML IV SOLN
10.0000 meq | INTRAVENOUS | Status: AC
  Administered 2023-03-30 – 2023-03-31 (×6): 10 meq via INTRAVENOUS
  Filled 2023-03-30 (×6): qty 100

## 2023-03-30 MED ORDER — SODIUM CHLORIDE 0.9 % IV SOLN
2.0000 g | Freq: Once | INTRAVENOUS | Status: AC
  Administered 2023-03-30: 2 g via INTRAVENOUS
  Filled 2023-03-30: qty 12.5

## 2023-03-30 MED ORDER — METRONIDAZOLE 500 MG/100ML IV SOLN
500.0000 mg | Freq: Two times a day (BID) | INTRAVENOUS | Status: DC
  Administered 2023-03-30 – 2023-04-04 (×11): 500 mg via INTRAVENOUS
  Filled 2023-03-30 (×14): qty 100

## 2023-03-30 MED ORDER — ALBUMIN HUMAN 25 % IV SOLN
25.0000 g | Freq: Once | INTRAVENOUS | Status: AC
  Administered 2023-03-30: 25 g via INTRAVENOUS
  Filled 2023-03-30: qty 100

## 2023-03-30 MED ORDER — LORAZEPAM 2 MG/ML IJ SOLN
0.5000 mg | Freq: Once | INTRAMUSCULAR | Status: AC | PRN
  Administered 2023-03-30: 0.5 mg via INTRAVENOUS
  Filled 2023-03-30: qty 1

## 2023-03-30 MED ORDER — POTASSIUM CHLORIDE CRYS ER 20 MEQ PO TBCR
40.0000 meq | EXTENDED_RELEASE_TABLET | Freq: Once | ORAL | Status: DC
  Filled 2023-03-30: qty 2

## 2023-03-30 MED ORDER — LACTATED RINGERS IV BOLUS
500.0000 mL | Freq: Once | INTRAVENOUS | Status: AC
  Administered 2023-03-30: 500 mL via INTRAVENOUS

## 2023-03-30 MED ORDER — ENOXAPARIN SODIUM 40 MG/0.4ML IJ SOSY
40.0000 mg | PREFILLED_SYRINGE | INTRAMUSCULAR | Status: DC
  Administered 2023-03-30: 40 mg via SUBCUTANEOUS
  Filled 2023-03-30: qty 0.4

## 2023-03-30 MED ORDER — CHLORHEXIDINE GLUCONATE CLOTH 2 % EX PADS
6.0000 | MEDICATED_PAD | Freq: Every day | CUTANEOUS | Status: DC
  Administered 2023-03-30 – 2023-04-02 (×4): 6 via TOPICAL

## 2023-03-30 MED ORDER — PANTOPRAZOLE SODIUM 40 MG IV SOLR
40.0000 mg | Freq: Two times a day (BID) | INTRAVENOUS | Status: DC
  Administered 2023-03-30 – 2023-04-04 (×11): 40 mg via INTRAVENOUS
  Filled 2023-03-30 (×11): qty 10

## 2023-03-30 NOTE — Progress Notes (Signed)
   03/30/23 0749  Assess: MEWS Score  Temp (!) 102.1 F (38.9 C)  BP 96/61  MAP (mmHg) 72  Pulse Rate (!) 112  Resp 16  Level of Consciousness Alert  SpO2 92 %  O2 Device Room Air  Assess: MEWS Score  MEWS Temp 2  MEWS Systolic 1  MEWS Pulse 2  MEWS RR 0  MEWS LOC 0  MEWS Score 5  MEWS Score Color Red  Assess: if the MEWS score is Yellow or Red  Were vital signs accurate and taken at a resting state? Yes  Does the patient meet 2 or more of the SIRS criteria? Yes  Does the patient have a confirmed or suspected source of infection? No  MEWS guidelines implemented  Yes, red  Treat  MEWS Interventions Considered administering scheduled or prn medications/treatments as ordered  Take Vital Signs  Increase Vital Sign Frequency  Red: Q1hr x2, continue Q4hrs until patient remains green for 12hrs  Escalate  MEWS: Escalate Red: Discuss with charge nurse and notify provider. Consider notifying RRT. If remains red for 2 hours consider need for higher level of care  Notify: Charge Nurse/RN  Name of Charge Nurse/RN Notified Verl Bangs, RN  Provider Notification  Provider Name/Title Dr. Renae Gloss  Date Provider Notified 03/30/23  Time Provider Notified [phone]  Method of Notification  (messaged and MD came straight to bedside)  Notification Reason Change in status  Provider response En route  Date of Provider Response 03/30/23 [phone])  Time of Provider Response 0751  Assess: SIRS CRITERIA  SIRS Temperature  1  SIRS Pulse 1  SIRS Respirations  0  SIRS WBC 0  SIRS Score Sum  2

## 2023-03-30 NOTE — Plan of Care (Signed)
  Problem: Education: Goal: Knowledge of General Education information will improve Description: Including pain rating scale, medication(s)/side effects and non-pharmacologic comfort measures Outcome: Progressing   Problem: Health Behavior/Discharge Planning: Goal: Ability to manage health-related needs will improve Outcome: Progressing   Problem: Clinical Measurements: Goal: Ability to maintain clinical measurements within normal limits will improve Outcome: Progressing Goal: Will remain free from infection Outcome: Progressing Goal: Diagnostic test results will improve Outcome: Progressing Goal: Respiratory complications will improve Outcome: Progressing Goal: Cardiovascular complication will be avoided Outcome: Progressing   Problem: Safety: Goal: Ability to remain free from injury will improve Outcome: Progressing   Problem: Skin Integrity: Goal: Risk for impaired skin integrity will decrease Outcome: Progressing   Problem: Fluid Volume: Goal: Hemodynamic stability will improve Outcome: Progressing

## 2023-03-30 NOTE — Assessment & Plan Note (Addendum)
Sepsis fluid bolus given.  Lactic acid 2.5.  Antibiotics changed to Maxipime and Flagyl.  MRCP ordered.  Patient with tachycardia, leukocytosis and fever and hypotension.  Will start midodrine.

## 2023-03-30 NOTE — Progress Notes (Signed)
1850-Received pt. In icu 05. Pts v/s are stable at this time. Pt is alert to self and surroundings.

## 2023-03-30 NOTE — TOC Progression Note (Addendum)
Transition of Care [facility]) - Progression Note    Patient Details  Name: Ryan Rose MRN: [id number] Date of Birth: [birth year]  [facility]) CM/SW Contact  Margarito Liner, LCSW Phone Number: 03/30/2023, 12:34 PM  Clinical Narrative:   Left voicemail for wife to see if one of patient's sisters was willing to make decisions.  3:57 pm: Received voicemail from wife asking about completing a Medicaid application for SNF. CSW emailed financial counselors to see if they would be able to assist with this.  Expected Discharge Plan and Services                                               Social Determinants of Health (SDOH) Interventions SDOH Screenings   Food Insecurity: Patient Declined (03/21/2023)  Housing: Patient Declined (03/21/2023)  Transportation Needs: No Transportation Needs (03/21/2023)  Utilities: Not At Risk (03/21/2023)  Tobacco Use: High Risk (03/25/2023)    Readmission Risk Interventions     No data to display

## 2023-03-30 NOTE — Consult Note (Signed)
NAME:  Ryan Rose, MRN:  [id number], DOB:  07/04/1961, LOS: 9 ADMISSION DATE:  03/21/2023, CONSULTATION DATE:  03/30/2023 REFERRING MD:  Dr. Renae Gloss, CHIEF COMPLAINT:  Hypotension   Brief Pt Description / Synopsis:  62 y.o. male admitted with PMHx significant for ETOH abuse, AAA s/p repair who presented with intractable nausea and vomiting, admitted with Alcohol Gastritis and Alcoholic Ketoacidosis with multiple electrolyte & metabolic derangements.  Hospital course complicated by Acute GI Bleed due to esophagitis and angiectasia requiring argon plasma coagulation, Alcohol Withdrawal, and severe sepsis with developing shock in the setting of aspiration pneumonia and suspected intraabdominal infectious process.  History of Present Illness:  "Ryan Rose is a 62 y.o. male with medical history significant of AAA status post repair and stenting, alcohol abuse, CAD, HTN, PVD, presented to [facility] ED on 03/21/23 with persistent nauseous vomiting chest pain abdominal pain.   Symptoms started day prior to presentation, patient started to develop nauseous followed by frequent vomiting of stomach content multiple times associated with burning sensation behind the chest and cramping-like epigastric pain, unable to take any food or water down whole day yesterday.  Denies any diarrhea, no fever or chills.  He admitted he drinks occasionally and last drink was 2 days ago.  ED Course: Initial Vital Signs: Afebrile, borderline tachycardia nonhypotensive nonhypoxic  Significant Labs:K2.8, bicarb 10, anion gap 20, creatinine 0.6, AST 163 bilirubin 3.5, WBC 23  Imaging CT abdomen pelvis with contrast showed no leakage or damage of AAA repair site"   He was admitted by [facility] for further workup and treatment.  Please see "Significant Hospital Events" section below for full detailed hospital course.  Pertinent  Medical History   Past Medical History:  Diagnosis Date   Anemia    Aortic atherosclerosis (HCC)     Arthritis    Bilateral carpal tunnel syndrome    Coronary artery disease    Diastolic dysfunction    a.) TTE 08/24/2022: EF 60-65%, mild-mod MR, G1DD.   ETOH abuse    Hepatic steatosis    History of methicillin resistant staphylococcus aureus (MRSA)    HLD (hyperlipidemia)    HTN (hypertension)    Infrarenal abdominal aortic aneurysm (AAA) without rupture (HCC) 05/28/2022   a.) CT AP 05/28/2022: saccular thrombosed; measures 4.4 x 3.2 cm   Nausea & vomiting 03/21/2023   PVD (peripheral vascular disease) with claudication (HCC)    Sigmoid diverticulosis    Transaminitis     Micro Data:  8/12: Acute viral Hepatitis panel>> non-reactive 8/20: Blood culture x2>>   Antimicrobials:   Anti-infectives (From admission, onward)    Start     Dose/Rate Route Frequency Ordered Stop   03/30/23 1800  ceFEPIme (MAXIPIME) 2 g in sodium chloride 0.9 % 100 mL IVPB        2 g 200 mL/hr over 30 Minutes Intravenous Every 8 hours 03/30/23 1235     03/30/23 1000  metroNIDAZOLE (FLAGYL) IVPB 500 mg        500 mg 100 mL/hr over 60 Minutes Intravenous Every 12 hours 03/30/23 0819 04/06/23 0959   03/30/23 0900  ceFEPIme (MAXIPIME) 2 g in sodium chloride 0.9 % 100 mL IVPB        2 g 200 mL/hr over 30 Minutes Intravenous  Once 03/30/23 0819 03/30/23 1042   03/28/23 1600  Ampicillin-Sulbactam (UNASYN) 3 g in sodium chloride 0.9 % 100 mL IVPB  Status:  Discontinued        3 g 200 mL/hr over  30 Minutes Intravenous Every 6 hours 03/28/23 1442 03/30/23 0819   03/25/23 2000  cefTRIAXone (ROCEPHIN) 1 g in sodium chloride 0.9 % 100 mL IVPB  Status:  Discontinued        1 g 200 mL/hr over 30 Minutes Intravenous Every 24 hours 03/25/23 1837 03/27/23 0719       Significant Hospital Events: Including procedures, antibiotic start and stop dates in addition to other pertinent events   8/11: Presented to ED, admitted by [facility]. 8/12: Pt seen awake resting in bed this AM. He erports N/V has eased up some since  yesterday. Denies feeling tremulous, sweaty or other symptoms of alcohol withdrawal.  8/13: Pt sleeping comfortably, having been given Ativan for CIWA score of 11 this AM.  He wakes briefly to voice, denies any complaints. States feeling a little better.  8/15.  Patient had upper endoscopy done which showed grade D esophagitis, large hiatal hernia and single nonbleeding angiectasia in the stomach treated with argon plasma. 8/16.  Patient's hemoglobin dipped down to 6.5.  Spoke with wife and consented patient for blood transfusion. 8/17.  Hemoglobin up to 10.7, creatinine 0.47, potassium 3.0, sodium 133 8/18.  Patient refused medications and not eating much.  Hemoglobin 9.7, potassium 3.2, total bilirubin 4.5, sodium 132.  Chest x-ray possible aspiration pneumonia.  Started on Unasyn. 8/19.  Patient actually able to participate with physical therapy today. 8/20.  Called this morning when patient had a fever and hypotensive and tachycardia.  Patient given sepsis fluid bolus.  Antibiotics changed over to Maxipime and Flagyl.  MRCP ordered.  Transfer to [facility] with PCCM consultation for possible vasopressors.  Interim History / Subjective:  -This morning pt noted to have fever, tachycardia, and hypotension concerning for developing sepsis -ABX were broadened to Cefepime and Flagyl, MRSP order by Dr. Renae Gloss today -Has received 2.5 L of LR boluses -Despite fluid, still remains hypotensive ~ transferring to [facility] with PCCM consultation in case were to need vasopressors -Upon arrival to ICU, SBP in low 100's (MAP >65), pt asymptomatic, awake and alert, oriented to person and place -Denies all complaints, abdominal exam is benign  Objective   Blood pressure (!) 79/56, pulse (!) 102, temperature 98.2 F (36.8 C), temperature source Oral, resp. rate 16, height 5' 8.5" (1.74 m), weight 64.9 kg, SpO2 92%.        Intake/Output Summary (Last 24 hours) at 03/30/2023 1731 Last data filed at 03/30/2023  1730 Gross per 24 hour  Intake 1051.35 ml  Output 600 ml  Net 451.35 ml   Filed Weights   03/21/23 2952 03/25/23 0959 03/29/23 0623  Weight: 65.5 kg 63.2 kg 64.9 kg    Examination: General: Acute on chronically ill appearing frail male, laying in bed, on room air, in NAD HENT: Atraumatic, normocephalic, neck supple, no JVD Lungs: Clear diminished breath sounds throughout, even, nonlabored, normal effort Cardiovascular: RRR, s1s2, no M/R/G Abdomen: Soft, nontender, nondistended, no guarding or rebound tenderness, BS + x4 Extremities: Normal bulk and tone, no deformities, no edema, no cyanosis Neuro: Awake and alert, oriented to person and place, moves all extremities to commands, no focal deficits, speech clear, pupils PERRL GU: external male catheter in place  Resolved Hospital Problem list     Assessment & Plan:   #Shock: Septic +/- Hypovolemic ~ IMPROVED PMHx: AAA s/p repair and stent, HTN, HLD, CAD, HFpEF, PVD Echocardiogram 08/24/22: LVEF 60-65%, Grade I DD, RV systolic function normal -Continuous cardiac monitoring -Maintain MAP >65 -IV fluids (received 2.5  L LR boluses on 8/20) -Vasopressors as needed to maintain MAP goal -Increase midodrine to 10 mg TID -Trend lactic acid until normalized -Consider Echocardiogram   #Severe Sepsis in setting of Aspiration Pneumonia & questionable intraabdominal process (Meets SIRS Criteria: fever, tachycardia, leukocytosis) -Monitor fever curve -Trend WBC's & Procalcitonin -Follow cultures as above -Continue empiric Cefepime and Flagyl pending cultures & sensitivities -MRCP is pending  #Acute Blood Loss Anemia in setting of Acute GI Bleed EGD on 8/15 which showed grade D Esophagitis,large hiatal hernia, and angiectasia which was treated with argon plasma coagulation -Monitor for S/Sx of bleeding -Trend CBC -SCD's for VTE Prophylaxis  -Transfuse for Hgb <7 -Continue Protonix BID  #Transaminitis, suspect secondary to  ETOH PMHx: Hepatic steatosis, transaminitis -Trend LFT's and coags -MRCP is pending  #Hyponatremia #Hypokalemia #AG Metabolic Acidosis -Monitor I&O's / urinary output -Follow BMP -Ensure adequate renal perfusion -Avoid nephrotoxic agents as able -Replace electrolytes as indicated ~ Pharmacy following for assistance with electrolyte replacement -IV fluids  #Alcohol Withdrawal -Treatment of metabolic derangements as outlined above -Provide supportive care -Promote normal sleep/wake cycle and family presence -Avoid sedating medications as able -Received 2 days of high-dose thiamine -Continue Thiamine, folic acid, MVI -PRN Librium per CIWA protocol      Best Practice (right click and "Reselect all SmartList Selections" daily)   Diet/type: NPO DVT prophylaxis: Lovenox GI prophylaxis: PPI Lines: N/A Foley:  N/A Code Status:  full code Last date of multidisciplinary goals of care discussion [N/A]  Labs   CBC: Recent Labs  Lab 03/24/23 0309 03/25/23 0335 03/26/23 0536 03/27/23 0500 03/28/23 0644 03/30/23 0522  WBC 5.0 6.4 8.7 10.3  --  14.0*  NEUTROABS  --  4.9 6.3 8.6*  --   --   HGB 7.8* 8.2* 6.5* 10.7* 9.7* 9.1*  HCT 22.5* 23.9* 17.7* 28.8*  --  27.1*  MCV 100.0 100.0 95.2 92.0  --  96.8  PLT 149* 154 180 310  --  380    Basic Metabolic Panel: Recent Labs  Lab 03/24/23 0309 03/25/23 0335 03/26/23 0536 03/27/23 0500 03/28/23 0644 03/30/23 0522 03/30/23 0842  NA 133* 131* 131* 133* 132* 137 133*  K 2.9* 3.8 3.2* 3.0* 3.2* 3.3* 2.8*  CL 99 100 100 102 101 105 105  CO2 27 22 21* 23 23 24  19*  GLUCOSE 109* 100* 96 119* 102* 81 72  BUN <5* <5* 7* 7* 12 5* 5*  CREATININE 0.40* 0.47* 0.53* 0.47* 0.50* 0.43* 0.35*  CALCIUM 7.6* 7.5* 7.4* 7.8* 7.4* 7.3* 6.9*  MG 1.9  --   --  1.7  --  2.2  --   PHOS 1.2* 2.3*  --  2.3*  --  2.9  --    GFR: Estimated Creatinine Clearance: 87.9 mL/min (A) (by C-G formula based on SCr of 0.35 mg/dL (L)). Recent Labs  Lab  03/25/23 0335 03/26/23 0536 03/27/23 0500 03/30/23 0522 03/30/23 0830 03/30/23 1118 03/30/23 1453  WBC 6.4 8.7 10.3 14.0*  --   --   --   LATICACIDVEN  --   --   --   --  1.4 2.5* 1.2    Liver Function Tests: Recent Labs  Lab 03/24/23 0309 03/28/23 0644 03/30/23 0522 03/30/23 0842  AST 141* 141* 126* 117*  ALT 23 33 29 28  ALKPHOS 157* 185* 224* 205*  BILITOT 2.8* 4.5* 5.5* 5.1*  PROT 5.7* 6.0* 5.9* 5.5*  ALBUMIN 1.9* 1.9* 1.6* 1.6*   Recent Labs  Lab 03/30/23 0522  LIPASE  49   No results for input(s): "AMMONIA" in the last 168 hours.  ABG No results found for: "PHART", "PCO2ART", "PO2ART", "HCO3", "TCO2", "ACIDBASEDEF", "O2SAT"   Coagulation Profile: Recent Labs  Lab 03/30/23 0522  INR 1.3*    Cardiac Enzymes: Recent Labs  Lab 03/29/23 1750  CKTOTAL 69    HbA1C: Hgb A1c MFr Bld  Date/Time Value Ref Range Status  03/21/2023 07:07 AM 4.4 (L) 4.8 - 5.6 % Final    Comment:    (NOTE)         Prediabetes: 5.7 - 6.4         Diabetes: >6.4         Glycemic control for adults with diabetes: <7.0     CBG: No results for input(s): "GLUCAP" in the last 168 hours.  Review of Systems:   Positives in BOLD: Pt denies all complaints Gen: Denies fever, chills, weight change, fatigue, night sweats HEENT: Denies blurred vision, double vision, hearing loss, tinnitus, sinus congestion, rhinorrhea, sore throat, neck stiffness, dysphagia PULM: Denies shortness of breath, cough, sputum production, hemoptysis, wheezing CV: Denies chest pain, edema, orthopnea, paroxysmal nocturnal dyspnea, palpitations GI: Denies abdominal pain, nausea, vomiting, diarrhea, hematochezia, melena, constipation, change in bowel habits GU: Denies dysuria, hematuria, polyuria, oliguria, urethral discharge Endocrine: Denies hot or cold intolerance, polyuria, polyphagia or appetite change Derm: Denies rash, dry skin, scaling or peeling skin change Heme: Denies easy bruising, bleeding,  bleeding gums Neuro: Denies headache, numbness, weakness, slurred speech, loss of memory or consciousness   Past Medical History:  He,  has a past medical history of Anemia, Aortic atherosclerosis (HCC), Arthritis, Bilateral carpal tunnel syndrome, Coronary artery disease, Diastolic dysfunction, ETOH abuse, Hepatic steatosis, History of methicillin resistant staphylococcus aureus (MRSA), HLD (hyperlipidemia), HTN (hypertension), Infrarenal abdominal aortic aneurysm (AAA) without rupture (HCC) (05/28/2022), Nausea & vomiting (03/21/2023), PVD (peripheral vascular disease) with claudication (HCC), Sigmoid diverticulosis, and Transaminitis.   Surgical History:   Past Surgical History:  Procedure Laterality Date   CERVICAL LAMINOPLASTY  08/20/2021   right C3-C6   COLONOSCOPY     ENDOVASCULAR REPAIR/STENT GRAFT N/A 09/09/2022   Procedure: ENDOVASCULAR REPAIR/STENT GRAFT;  Surgeon: Renford Dills, MD;  Location: [facility] INVASIVE CV LAB;  Service: Cardiovascular;  Laterality: N/A;   ESOPHAGOGASTRODUODENOSCOPY N/A 03/25/2023   Procedure: ESOPHAGOGASTRODUODENOSCOPY (EGD);  Surgeon: Wyline Mood, MD;  Location: [facility] ENDOSCOPY;  Service: Gastroenterology;  Laterality: N/A;   HOT HEMOSTASIS  03/25/2023   Procedure: HOT HEMOSTASIS (ARGON PLASMA COAGULATION/BICAP);  Surgeon: Wyline Mood, MD;  Location: [facility] ENDOSCOPY;  Service: Gastroenterology;;   SKIN GRAFT     to hand and forearm     Social History:   reports that he has been smoking cigarettes. He has a 4.5 pack-year smoking history. He has never used smokeless tobacco. He reports current alcohol use. He reports current drug use. Drug: Marijuana.   Family History:  His family history includes Heart attack in his mother.   Allergies Allergies  Allergen Reactions   Sulfa Antibiotics Hives     Home Medications  Prior to Admission medications   Medication Sig Start Date End Date Taking? Authorizing Provider  acetaminophen (TYLENOL) 500 MG tablet  Take one or two tablet by mouth every 6  hours as needed for pain 05/13/22  Yes   aspirin 81 MG chewable tablet Chew 81 mg by mouth daily as needed.   Yes [provider]  atorvastatin (LIPITOR) 40 MG tablet Take 1 tablet (40 mg total) by mouth daily. 08/13/22 08/08/23  Yes Debbe Odea, MD  clopidogrel (PLAVIX) 75 MG tablet Take 1 tablet (75 mg total) by mouth daily. 09/10/22  Yes Pace, Brien R, NP  losartan (COZAAR) 25 MG tablet Take 1 tablet (25 mg total) by mouth daily. 08/13/22 08/08/23 Yes Agbor-Etang, Arlys John, MD  melatonin 3 MG TABS tablet Take 3 mg by mouth at bedtime.   Yes [provider]  senna-docusate (SENOKOT-S) 8.6-50 MG tablet Take 2 tablets by mouth at bedtime. For constipation.  Also available over-the-counter 05/30/22  Yes Rai, Ripudeep K, MD  traMADol (ULTRAM) 50 MG tablet Take 1 tablet (50 mg total) by mouth every 6 (six) hours as needed for up to 20 doses for moderate pain or severe pain. Patient not taking: Reported on 03/21/2023 05/30/22   Cathren Harsh, MD     Critical care time: 45 minutes     Harlon Ditty, AGACNP-BC [facility] Prefer epic messenger for cross cover needs If after hours, please call E-link

## 2023-03-30 NOTE — Progress Notes (Signed)
With last blood pressure of 79/56 and patient not taking midodrine, will transfer to the ICU for closer monitoring of blood pressure.  Another fluid bolus ordered.  Critical care team consulted.  Physical Exam Cardiovascular:     Rate and Rhythm: Regular rhythm. Tachycardia present.     Heart sounds: Normal heart sounds, S1 normal and S2 normal.  Pulmonary:     Breath sounds: Examination of the right-lower field reveals decreased breath sounds and rhonchi. Examination of the left-lower field reveals decreased breath sounds and rhonchi. Decreased breath sounds and rhonchi present. No wheezing or rales.  Abdominal:     Palpations: Abdomen is soft.     Tenderness: There is no abdominal tenderness.     MRCP still pending. Will place on a diet but patient over the last few days has not eaten much. Case discussed with critical care team and nursing staff  Dr. Alford Highland. 20 minutes

## 2023-03-30 NOTE — Progress Notes (Signed)
Progress Note   Patient: Ryan Rose OZD:[id number] DOB: 01/09/1961 DOA: 03/21/2023     9 DOS: the patient was seen and examined on 03/30/2023   Brief hospital course: HPI on admission 03/22/23 by Dr. Chipper Herb: "AARIV DOERFLEIN is a 62 y.o. male with medical history significant of AAA status post repair and stenting, alcohol abuse, CAD, HTN, PVD, presented with persistent nauseous vomiting chest pain abdominal pain.   Symptoms started yesterday patient started to develop nauseous followed by frequent vomiting of stomach content multiple times associated with burning sensation behind the chest and cramping-like epigastric pain, unable to take any food or water down whole day yesterday.  Denies any diarrhea, no fever or chills.  He admitted he drinks occasionally and last drink was 2 days ago.   ED Course: Afebrile, borderline tachycardia nonhypotensive nonhypoxic.  Blood work showed K2.8, bicarb 10, anion gap 20, creatinine 0.6, AST 163 bilirubin 3.5, WBC 23   CT abdomen pelvis with contrast showed no leakage or damage of AAA repair site"  8/15.  Patient had upper endoscopy done which showed grade D esophagitis, large hiatal hernia and single nonbleeding angiectasia in the stomach treated with argon plasma. 8/16.  Patient's hemoglobin dipped down to 6.5.  Spoke with wife and consented patient for blood transfusion. 8/17.  Hemoglobin up to 10.7, creatinine 0.47, potassium 3.0, sodium 133 8/18.  Patient refused medications and not eating much.  Hemoglobin 9.7, potassium 3.2, total bilirubin 4.5, sodium 132.  Chest x-ray possible aspiration pneumonia.  Started on Unasyn. 8/19.  Patient actually able to participate with physical therapy today. 8/20.  Called this morning when patient had a fever and hypotensive and tachycardia.  Patient given sepsis fluid bolus.  Antibiotics changed over to Maxipime and Flagyl.  MRCP ordered.   Assessment and Plan: * Severe sepsis (HCC) Sepsis fluid bolus given.   Lactic acid 2.5.  Antibiotics changed to Maxipime and Flagyl.  MRCP ordered.  Patient with tachycardia, leukocytosis and fever and hypotension.  Will start midodrine.  Alcohol withdrawal delirium (HCC) Received 2 days of high-dose thiamine.  As needed Librium.  Right now patient does not have capacity make medical decisions.  Patient's wife will not have him return home with her.  Patient's mental status today he is able to answer some questions.  Acute blood loss anemia Transfused 1 unit of packed red blood cells on 8/16 on a hemoglobin of 6.5.  Last hemoglobin 9.1.  Acute upper GI bleed EGD on 8/15 shows grade D esophagitis, hiatal hernia and angiectasia which was treated with argon plasma coagulation.  Change Protonix back to IV.   Aspiration pneumonia (HCC) Unasyn switched over to Maxipime and Flagyl.  Nausea & vomiting Patient made n.p.o. for MRCP  Hypophosphatemia Phosphorus level normal today  Hypomagnesemia Magnesium level normal  Hypokalemia IV potassium ordered.  Will also order oral potassium  Alcoholic ketoacidosis Resolved  PVD (peripheral vascular disease) (HCC) Hold Plavix.  Continue atorvastatin.  Transaminitis Secondary to alcohol abuse.  Alkaline phosphatase 205, total bilirubin of 5.1, AST 117, albumin low at 1.6  Leukocytosis White blood cell count up to 14 today  Abdominal aortic aneurysm (AAA) (HCC) No endoleak on CTA on admission. Monitor.  Macrocytic anemia MCV down to 96.8  Hyponatremia Last Na 133        Subjective: Patient asking for food this morning.  Called because of fever tachycardia and hypotension.  Patient complains of some abdominal pain.  Admitted initially with alcohol withdrawal and GI bleed.  Physical Exam: Vitals:   03/30/23 0749 03/30/23 0910 03/30/23 1016 03/30/23 1138  BP: 96/61 (!) 144/108 (!) 87/56 90/64  Pulse: (!) 112 98 (!) 118 (!) 113  Resp: 16 (!) 24 (!) 24 (!) 24  Temp: (!) 102.1 F (38.9 C) (!) 97.5  F (36.4 C) 98.9 F (37.2 C) 98.3 F (36.8 C)  TempSrc:      SpO2: 92% (!) 84% 96% 95%  Weight:      Height:       Physical Exam HENT:     Head: Normocephalic.     Mouth/Throat:     Pharynx: No oropharyngeal exudate.  Eyes:     General: Lids are normal.     Comments: Conjunctiva icteric  Cardiovascular:     Rate and Rhythm: Regular rhythm. Tachycardia present.     Heart sounds: Normal heart sounds, S1 normal and S2 normal.  Pulmonary:     Breath sounds: Examination of the right-lower field reveals decreased breath sounds. Examination of the left-lower field reveals decreased breath sounds. Decreased breath sounds present. No wheezing, rhonchi or rales.  Abdominal:     Palpations: Abdomen is soft.     Tenderness: There is generalized abdominal tenderness.  Musculoskeletal:     Right lower leg: No swelling.     Left lower leg: No swelling.  Skin:    General: Skin is warm.     Findings: No rash.  Neurological:     Mental Status: He is lethargic.     Comments: Patient able to talk a little bit today.  Able to lift his arms up off the bed.  Did not straight leg raise for me.     Data Reviewed: White blood cell count 14, hemoglobin 9.1, platelet count 380, lactic acid went up to 2.5, creatinine 0.35, sodium 133, potassium 2.8.  Liver function bilirubin elevated 5.1 and AST 117  Family Communication: Spoke with wife on the phone  Disposition: Status is: Inpatient Remains inpatient appropriate because: Patient with sepsis today.  Giving fluid boluses and changed up antibiotics and MRCP of the abdomen ordered  Planned Discharge Destination: To be determined    Time spent: 35 minutes  Author: Alford Highland, MD 03/30/2023 1:15 PM  For on call review [email or web address].

## 2023-03-30 NOTE — Sepsis Progress Note (Signed)
Notified provider and bedside nurse of need to order and draw repeat lactic acid #3.  

## 2023-03-30 NOTE — Progress Notes (Signed)
Clydie Braun with Lab came to floor to draw labs on patient and patient stated he was tired of being stuck and refused labs to be drawn. Made MD aware. Patient has also refused all oral medications today. MD also aware of same.

## 2023-03-30 NOTE — Progress Notes (Signed)
eLink Physician-Brief Progress Note Patient Name: Ryan Rose DOB: [birth year] MRN: [id number]   Date of Service  03/30/2023  HPI/Events of Note  Patient admitted for ETOH gastritis, ketoacidosis, and withdrawal, then developed GI bleeding, and was transferred to the ICU following  EGD.  eICU Interventions  New Patient Evaluation.        Thomasene Lot Joy Reiger 03/30/2023, 8:15 PM

## 2023-03-30 NOTE — Consult Note (Signed)
Pharmacy Antibiotic Note  Ryan Rose is a 62 y.o. male admitted on 03/21/2023 with sepsis.  Patient this AM was febrile, hypotensive and tachy. Lactic acid was 2.5. MCRP ordered still pending. Pharmacy has been consulted for Cefepime dosing.  Plan: This patient's current antibiotics will be continued without adjustments.  Height: 5' 8.5" (174 cm) Weight: 64.9 kg (143 lb 1.3 oz) IBW/kg (Calculated) : 69.55  Temp (24hrs), Avg:100.4 F (38 C), Min:97.7 F (36.5 C), Max:102.1 F (38.9 C)  Recent Labs  Lab 03/24/23 0309 03/25/23 0335 03/26/23 0536 03/27/23 0500 03/28/23 0644 03/30/23 0522  WBC 5.0 6.4 8.7 10.3  --  14.0*  CREATININE 0.40* 0.47* 0.53* 0.47* 0.50* 0.43*    Estimated Creatinine Clearance: 87.9 mL/min (A) (by C-G formula based on SCr of 0.43 mg/dL (L)).    Allergies  Allergen Reactions   Sulfa Antibiotics Hives    Antimicrobials this admission: Cefepime 2g/d >>  Metronadazole 500mg  BID >> end 8/27  Dose adjustments this admission: NA  Microbiology results: 8/20 BCx: In process  Thank you for allowing pharmacy to be a part of this patient's care.  Effie Shy PGY1 Pharmacy Resident 03/30/2023 8:37 AM

## 2023-03-30 NOTE — Sepsis Progress Note (Signed)
Elink monitoring for the code sepsis protocol.  

## 2023-03-30 NOTE — Progress Notes (Signed)
PT Cancellation Note  Patient Details Name: Ryan Rose MRN: [id number] DOB: [birth year]   Cancelled Treatment:    Reason Eval/Treat Not Completed: Medical issues which prohibited therapy [place] noted.  Chart reviewed.  Will hold PT at this time and return at a later time/date as appropriate.  Danielle Dess 03/30/2023, 10:38 AM

## 2023-03-30 NOTE — Sepsis Progress Note (Signed)
[place] to bedside RN to hang fluids, antibiotics can be hung after blood cultures drawn, call if questions

## 2023-03-30 NOTE — Progress Notes (Signed)
[facility] with the lab called to report a critical Lactic Acid - 2.5. Made MD aware.

## 2023-03-31 DIAGNOSIS — A419 Sepsis, unspecified organism: Secondary | ICD-10-CM | POA: Diagnosis not present

## 2023-03-31 DIAGNOSIS — R6520 Severe sepsis without septic shock: Secondary | ICD-10-CM | POA: Diagnosis not present

## 2023-03-31 LAB — COMPREHENSIVE METABOLIC PANEL
ALT: 20 U/L (ref 0–44)
AST: 83 U/L — ABNORMAL HIGH (ref 15–41)
Albumin: 1.7 g/dL — ABNORMAL LOW (ref 3.5–5.0)
Alkaline Phosphatase: 167 U/L — ABNORMAL HIGH (ref 38–126)
Anion gap: 5 (ref 5–15)
BUN: 7 mg/dL — ABNORMAL LOW (ref 8–23)
CO2: 20 mmol/L — ABNORMAL LOW (ref 22–32)
Calcium: 6.6 mg/dL — ABNORMAL LOW (ref 8.9–10.3)
Chloride: 107 mmol/L (ref 98–111)
Creatinine, Ser: 0.57 mg/dL — ABNORMAL LOW (ref 0.61–1.24)
GFR, Estimated: >60 mL/min (ref >60)
Glucose, Bld: 81 mg/dL (ref 70–99)
Potassium: 3.6 mmol/L (ref 3.5–5.1)
Sodium: 132 mmol/L — ABNORMAL LOW (ref 135–145)
Total Bilirubin: 4.7 mg/dL — ABNORMAL HIGH (ref 0.3–1.2)
Total Protein: 4.9 g/dL — ABNORMAL LOW (ref 6.5–8.1)

## 2023-03-31 LAB — PROTIME-INR
INR: 1.5 — ABNORMAL HIGH (ref 0.8–1.2)
Prothrombin Time: 18.1 seconds — ABNORMAL HIGH (ref 11.4–15.2)

## 2023-03-31 LAB — CBC
HCT: 20.1 % — ABNORMAL LOW (ref 39.0–52.0)
Hemoglobin: 6.8 g/dL — ABNORMAL LOW (ref 13.0–17.0)
MCH: 32.2 pg (ref 26.0–34.0)
MCHC: 33.8 g/dL (ref 30.0–36.0)
MCV: 95.3 fL (ref 80.0–100.0)
Platelets: 306 10*3/uL (ref 150–400)
RBC: 2.11 MIL/uL — ABNORMAL LOW (ref 4.22–5.81)
RDW: 16.6 % — ABNORMAL HIGH (ref 11.5–15.5)
WBC: 12.6 10*3/uL — ABNORMAL HIGH (ref 4.0–10.5)
nRBC: 0 % (ref 0.0–0.2)

## 2023-03-31 LAB — PREPARE RBC (CROSSMATCH)

## 2023-03-31 LAB — PHOSPHORUS: Phosphorus: 2.6 mg/dL (ref 2.5–4.6)

## 2023-03-31 LAB — MAGNESIUM: Magnesium: 1.8 mg/dL (ref 1.7–2.4)

## 2023-03-31 MED ORDER — SODIUM CHLORIDE 0.9% IV SOLUTION: Freq: Once | INTRAVENOUS | Status: AC

## 2023-03-31 MED ORDER — MAGNESIUM SULFATE 2 GM/50ML IV SOLN
2.0000 g | Freq: Once | INTRAVENOUS | Status: AC
  Administered 2023-03-31: 2 g via INTRAVENOUS
  Filled 2023-03-31: qty 50
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
## 2023-03-31 NOTE — Progress Notes (Signed)
PT Cancellation Note  Patient Details Name: Ryan Rose MRN: [id number] DOB: 15-Jan-1961   Cancelled Treatment:    Reason Eval/Treat Not Completed: Medical issues which prohibited therapy (Pt xfer to ICU previous day, will need continue upon transfer or updated PT orders for our services to resume. Otherwise hold at this time 2/2 Hb<7.0. Will continue to follow.)   8:55 AM, 03/31/23 Rosamaria Lints, PT, DPT Physical Therapist - [facility]  [phone] (ASCOM)    Briley Bumgarner C 03/31/2023, 8:54 AM

## 2023-03-31 NOTE — TOC Progression Note (Signed)
Transition of Care [facility]) - Progression Note    Patient Details  Name: Ryan Rose MRN: [id number] Date of Birth: 1960-10-01  [facility] CM/SW Contact  Kreg Shropshire, RN Phone Number: 03/31/2023, 8:52 AM  Clinical Narrative:     Cm spoke with Jeralene Huff listed wife on demographics. She did confirm that she is his wife and she will be making medical decisions for him going forward. She also stated that she is a employee of [employer] and has [employer] PPO plan that will expire on 8/31. She wanted additional information on signing up for medicaid. Cm sent email to Wife [name]. [email or web address] about how to sign up for medicaid in person and online. She is in agreement for SNF. She is in agreement for bed search in the [facility].       Expected Discharge Plan and Services                                               Social Determinants of Health (SDOH) Interventions SDOH Screenings   Food Insecurity: Patient Declined (03/21/2023)  Housing: Patient Declined (03/21/2023)  Transportation Needs: No Transportation Needs (03/21/2023)  Utilities: Not At Risk (03/21/2023)  Tobacco Use: High Risk (03/25/2023)    Readmission Risk Interventions     No data to display

## 2023-03-31 NOTE — Progress Notes (Signed)
[facility]  - [place] at [facility]   PATIENT NAME: Ryan Rose    MR#:  [id number]  DATE OF BIRTH:  July 26, 1961  SUBJECTIVE:  patient seen earlier. Resting quietly. Per RN has been erratic with taking his meds he has to be distracted for him to be taking it. Blood pressure remaining stable. Patient remains of the tachycardic. Refused his midodrine earlier but then later took it.  Patient was transferred to ICU for presume sepsis/hypotension.  VITALS:  Blood pressure 97/71, pulse 96, temperature 98.8 F (37.1 C), temperature source Axillary, resp. rate (!) 29, height 5\' 9"  (1.753 m), weight 65 kg, SpO2 100%.  PHYSICAL EXAMINATION:   GENERAL:  62 y.o.-year-old patient with no acute distress. Chronically ill LUNGS: Normal breath sounds bilaterally, no wheezing CARDIOVASCULAR: S1, S2 normal. No murmur   ABDOMEN: Soft, nontender, nondistended. Bowel sounds present.  EXTREMITIES: No  edema b/l.    NEUROLOGIC: nonfocal  patient is alert and awake SKIN: No obvious rash, lesion, or ulcer.   LABORATORY PANEL:  CBC Recent Labs  Lab 03/31/23 0453  WBC 12.6*  HGB 6.8*  HCT 20.1*  PLT 306    Chemistries  Recent Labs  Lab 03/31/23 0453  NA 132*  K 3.6  CL 107  CO2 20*  GLUCOSE 81  BUN 7*  CREATININE 0.57*  CALCIUM 6.6*  MG 1.8  AST 83*  ALT 20  ALKPHOS 167*  BILITOT 4.7*   Cardiac Enzymes No results for input(s): "TROPONINI" in the last 168 hours. RADIOLOGY:  MR 3D Recon At Scanner  Result Date: 03/30/2023 CLINICAL DATA:  Right upper quadrant abdominal pain. EXAM: MRI ABDOMEN WITHOUT CONTRAST  (INCLUDING MRCP) TECHNIQUE: Multiplanar multisequence MR imaging of the abdomen was performed. Heavily T2-weighted images of the biliary and pancreatic ducts were obtained, and three-dimensional MRCP images were rendered by post processing. COMPARISON:  CT scan 03/21/2023 FINDINGS: Lower chest: Small bilateral pleural effusions with overlying atelectasis. No  pericardial effusion. There is also a cystic appearing fluid collection near the distal esophagus on the right side. This was not present on the prior CT scan and could be loculated pleural fluid. Hepatobiliary: Severe diffuse fatty infiltration of the liver. No focal hepatic lesions are identified without contrast. No intrahepatic biliary dilatation. The gallbladder is distended measuring 5.1 cm in the transverse dimension. There is gallbladder wall thickening and pericholecystic inflammatory changes. There is layering high T1 material which is likely sludge. No definite gallstones. Normal caliber and course of the common bile duct. Pancreas:  No mass, inflammation or ductal dilatation. Spleen:  Normal size.  No focal lesions. Adrenals/Urinary Tract: The adrenal glands and kidneys are unremarkable. No renal lesions or hydronephrosis. Stomach/Bowel: The stomach, duodenum, visualized small bowel and visualized colon are unremarkable. Vascular/Lymphatic: Atherosclerotic disease involving the aorta with aortoiliac stent graft in place. No mesenteric or retroperitoneal mass or adenopathy. Other:  Moderate mesenteric edema and small volume ascites. Musculoskeletal: No significant bony findings. IMPRESSION: 1. Severe diffuse fatty infiltration of the liver. No focal hepatic lesions are identified without contrast. 2. Distended gallbladder with layering sludge. No definite gallstones. Gallbladder wall thickening and pericholecystic inflammatory changes could suggest acalculus cholecystitis or cholestasis. Normal caliber and course of the common bile duct. 3. Small bilateral pleural effusions with overlying atelectasis. There is also a cystic appearing fluid collection near the distal esophagus on the right side. This was not present on the prior CT scan and could be loculated pleural fluid. 4. Moderate mesenteric edema and small volume  ascites. Electronically Signed   By: Rudie Meyer M.D.   On: 03/30/2023 19:00   MR  ABDOMEN MRCP WO CONTRAST  Result Date: 03/30/2023 CLINICAL DATA:  Right upper quadrant abdominal pain. EXAM: MRI ABDOMEN WITHOUT CONTRAST  (INCLUDING MRCP) TECHNIQUE: Multiplanar multisequence MR imaging of the abdomen was performed. Heavily T2-weighted images of the biliary and pancreatic ducts were obtained, and three-dimensional MRCP images were rendered by post processing. COMPARISON:  CT scan 03/21/2023 FINDINGS: Lower chest: Small bilateral pleural effusions with overlying atelectasis. No pericardial effusion. There is also a cystic appearing fluid collection near the distal esophagus on the right side. This was not present on the prior CT scan and could be loculated pleural fluid. Hepatobiliary: Severe diffuse fatty infiltration of the liver. No focal hepatic lesions are identified without contrast. No intrahepatic biliary dilatation. The gallbladder is distended measuring 5.1 cm in the transverse dimension. There is gallbladder wall thickening and pericholecystic inflammatory changes. There is layering high T1 material which is likely sludge. No definite gallstones. Normal caliber and course of the common bile duct. Pancreas:  No mass, inflammation or ductal dilatation. Spleen:  Normal size.  No focal lesions. Adrenals/Urinary Tract: The adrenal glands and kidneys are unremarkable. No renal lesions or hydronephrosis. Stomach/Bowel: The stomach, duodenum, visualized small bowel and visualized colon are unremarkable. Vascular/Lymphatic: Atherosclerotic disease involving the aorta with aortoiliac stent graft in place. No mesenteric or retroperitoneal mass or adenopathy. Other:  Moderate mesenteric edema and small volume ascites. Musculoskeletal: No significant bony findings. IMPRESSION: 1. Severe diffuse fatty infiltration of the liver. No focal hepatic lesions are identified without contrast. 2. Distended gallbladder with layering sludge. No definite gallstones. Gallbladder wall thickening and  pericholecystic inflammatory changes could suggest acalculus cholecystitis or cholestasis. Normal caliber and course of the common bile duct. 3. Small bilateral pleural effusions with overlying atelectasis. There is also a cystic appearing fluid collection near the distal esophagus on the right side. This was not present on the prior CT scan and could be loculated pleural fluid. 4. Moderate mesenteric edema and small volume ascites. Electronically Signed   By: Rudie Meyer M.D.   On: 03/30/2023 19:00    Assessment and Plan Ryan Rose is a 62 y.o. male with medical history significant of AAA status post repair and stenting, alcohol abuse, CAD, HTN, PVD, presented with persistent nauseous vomiting chest pain abdominal pain.   upper endoscopy done which showed grade D esophagitis, large hiatal hernia and single nonbleeding angiectasia in the stomach treated with argon plasma.   MRCP 8/20--Severe diffuse fatty infiltration of the liver. No focal hepatic lesions are identified without contrast. 2. Distended gallbladder with layering sludge. No definite gallstones. Gallbladder wall thickening and pericholecystic inflammatory changes could suggest acalculus cholecystitis or cholestasis. Normal caliber and course of the common bile duct. 3. Small bilateral pleural effusions with overlying atelectasis. There is also a cystic appearing fluid collection near the distal esophagus on the right side. This was not present on the prior CT scan and could be loculated pleural fluid. 4. Moderate mesenteric edema and small volume ascites.  Severe sepsis (HCC) Acalculus cholecystitis Sepsis fluid bolus given.  Lactic acid 2.5.  Antibiotics changed to Maxipime and Flagyl.  MRCP ordered.  Patient with tachycardia, leukocytosis and fever and hypotension.  Will start midodrine. --MRCP as above --HIDA scan for AM --Gen surgery consult--secure chat sent to dr Toula Moos   Alcohol withdrawal delirium  (HCC) --Received 2 days of high-dose thiamine.   --As needed Librium.   --  Right now patient does not have capacity make medical decisions.   --Patient's wife will not have him return home with her.     Acute blood loss anemia Transfused 1 unit of packed red blood cells on 8/16 on a hemoglobin of 6.5.  -- Last hemoglobin 6.8--got 1 unit on 8/21-- continues to drop hemoglobin will have G.I. come see patient again   Acute upper GI bleed EGD on 8/15 shows grade D esophagitis, hiatal hernia and angiectasia which was treated with argon plasma coagulation.  Change Protonix back to IV.   Aspiration pneumonia (HCC) Unasyn switched over to Maxipime and Flagyl.   Nausea & vomiting Patient made n.p.o. for MRCP   Hypophosphatemia  Hypomagnesemia  Hypokalemia Replace as needed   Alcoholic ketoacidosis Resolved   PVD (peripheral vascular disease) (HCC) Hold Plavix.  Continue atorvastatin.   Transaminitis Secondary to alcohol abuse.  Alkaline phosphatase 205, total bilirubin of 5.1, AST 117, albumin low at 1.6   Leukocytosis White blood cell count up to 14 today   Abdominal aortic aneurysm (AAA) (HCC) No endoleak on CTA on admission. Monitor.   Macrocytic anemia MCV down to 96.8   Hyponatremia Last Na 133     Procedures: Family communication :none today Consults : G.I., ICU CODE STATUS: full DVT Prophylaxis : SCD Level of care: Stepdown Status is: Inpatient Remains inpatient appropriate because: hypertension, acute on chronic G.I. bleed, sepsis    TOTAL TIME TAKING CARE OF THIS PATIENT: 40 minutes.  >50% time spent on counselling and coordination of care  Note: This dictation was prepared with Dragon dictation along with smaller phrase technology. Any transcriptional errors that result from this process are unintentional.  Enedina Finner M.D    [facility]   CC: Primary care physician; Sherron Monday, MD

## 2023-03-31 NOTE — Plan of Care (Signed)
  Problem: Clinical Measurements: Goal: Diagnostic test results will improve Outcome: Progressing Goal: Respiratory complications will improve Outcome: Progressing Goal: Cardiovascular complication will be avoided Outcome: Progressing   Problem: Activity: Goal: Risk for activity intolerance will decrease Outcome: Not Progressing   Problem: Coping: Goal: Level of anxiety will decrease Outcome: Not Progressing

## 2023-03-31 NOTE — Plan of Care (Signed)
  Problem: Education: Goal: Knowledge of General Education information will improve Description: Including pain rating scale, medication(s)/side effects and non-pharmacologic comfort measures Outcome: Not Progressing   Problem: Health Behavior/Discharge Planning: Goal: Ability to manage health-related needs will improve Outcome: Not Progressing   Problem: Clinical Measurements: Goal: Ability to maintain clinical measurements within normal limits will improve Outcome: Not Progressing Goal: Will remain free from infection Outcome: Not Progressing Goal: Diagnostic test results will improve Outcome: Not Progressing Goal: Respiratory complications will improve Outcome: Not Progressing Goal: Cardiovascular complication will be avoided Outcome: Not Progressing   Problem: Activity: Goal: Risk for activity intolerance will decrease Outcome: Not Progressing   Problem: Nutrition: Goal: Adequate nutrition will be maintained Outcome: Not Progressing   Problem: Coping: Goal: Level of anxiety will decrease Outcome: Not Progressing   Problem: Elimination: Goal: Will not experience complications related to bowel motility Outcome: Not Progressing Goal: Will not experience complications related to urinary retention Outcome: Not Progressing   Problem: Pain Managment: Goal: General experience of comfort will improve Outcome: Not Progressing   Problem: Safety: Goal: Ability to remain free from injury will improve Outcome: Not Progressing   Problem: Skin Integrity: Goal: Risk for impaired skin integrity will decrease Outcome: Not Progressing   Problem: Fluid Volume: Goal: Hemodynamic stability will improve Outcome: Not Progressing   Problem: Clinical Measurements: Goal: Diagnostic test results will improve Outcome: Not Progressing Goal: Signs and symptoms of infection will decrease Outcome: Not Progressing   Problem: Respiratory: Goal: Ability to maintain adequate ventilation  will improve Outcome: Not Progressing   

## 2023-03-31 NOTE — Progress Notes (Addendum)
NAME:  Ryan Rose, MRN:  [id number], DOB:  May 24, 1961, LOS: 10 ADMISSION DATE:  03/21/2023, CONSULTATION DATE:  03/30/2023 REFERRING MD:  Dr. Renae Gloss, CHIEF COMPLAINT:  Hypotension   Brief Pt Description / Synopsis:  62 y.o. male admitted with PMHx significant for ETOH abuse, AAA s/p repair who presented with intractable nausea and vomiting, admitted with Alcohol Gastritis and Alcoholic Ketoacidosis with multiple electrolyte & metabolic derangements.  Hospital course complicated by Acute GI Bleed due to esophagitis and angiectasia requiring argon plasma coagulation, Alcohol Withdrawal, and severe sepsis with developing shock in the setting of aspiration pneumonia and suspected intraabdominal infectious process.  History of Present Illness:  "Ryan Rose is a 62 y.o. male with medical history significant of AAA status post repair and stenting, alcohol abuse, CAD, HTN, PVD, presented to [facility] ED on 03/21/23 with persistent nauseous vomiting chest pain abdominal pain.   Symptoms started day prior to presentation, patient started to develop nauseous followed by frequent vomiting of stomach content multiple times associated with burning sensation behind the chest and cramping-like epigastric pain, unable to take any food or water down whole day yesterday.  Denies any diarrhea, no fever or chills.  He admitted he drinks occasionally and last drink was 2 days ago.  ED Course: Initial Vital Signs: Afebrile, borderline tachycardia nonhypotensive nonhypoxic  Significant Labs:K2.8, bicarb 10, anion gap 20, creatinine 0.6, AST 163 bilirubin 3.5, WBC 23  Imaging CT abdomen pelvis with contrast showed no leakage or damage of AAA repair site"   He was admitted by [facility] for further workup and treatment.  Please see "Significant Hospital Events" section below for full detailed hospital course.  Pertinent  Medical History   Past Medical History:  Diagnosis Date   Anemia    Aortic atherosclerosis (HCC)     Arthritis    Bilateral carpal tunnel syndrome    Coronary artery disease    Diastolic dysfunction    a.) TTE 08/24/2022: EF 60-65%, mild-mod MR, G1DD.   ETOH abuse    Hepatic steatosis    History of methicillin resistant staphylococcus aureus (MRSA)    HLD (hyperlipidemia)    HTN (hypertension)    Infrarenal abdominal aortic aneurysm (AAA) without rupture (HCC) 05/28/2022   a.) CT AP 05/28/2022: saccular thrombosed; measures 4.4 x 3.2 cm   Nausea & vomiting 03/21/2023   PVD (peripheral vascular disease) with claudication (HCC)    Sigmoid diverticulosis    Transaminitis     Micro Data:  8/12: Acute viral Hepatitis panel>> non-reactive 8/20: Blood culture x2>> 8/20: MRSA PCR>>negative   Antimicrobials:   Anti-infectives (From admission, onward)    Start     Dose/Rate Route Frequency Ordered Stop   03/30/23 1800  ceFEPIme (MAXIPIME) 2 g in sodium chloride 0.9 % 100 mL IVPB        2 g 200 mL/hr over 30 Minutes Intravenous Every 8 hours 03/30/23 1235     03/30/23 1000  metroNIDAZOLE (FLAGYL) IVPB 500 mg        500 mg 100 mL/hr over 60 Minutes Intravenous Every 12 hours 03/30/23 0819 04/06/23 0959   03/30/23 0900  ceFEPIme (MAXIPIME) 2 g in sodium chloride 0.9 % 100 mL IVPB        2 g 200 mL/hr over 30 Minutes Intravenous  Once 03/30/23 0819 03/30/23 1042   03/28/23 1600  Ampicillin-Sulbactam (UNASYN) 3 g in sodium chloride 0.9 % 100 mL IVPB  Status:  Discontinued        3 g  200 mL/hr over 30 Minutes Intravenous Every 6 hours 03/28/23 1442 03/30/23 0819   03/25/23 2000  cefTRIAXone (ROCEPHIN) 1 g in sodium chloride 0.9 % 100 mL IVPB  Status:  Discontinued        1 g 200 mL/hr over 30 Minutes Intravenous Every 24 hours 03/25/23 1837 03/27/23 0719      Significant Hospital Events: Including procedures, antibiotic start and stop dates in addition to other pertinent events   8/11: Presented to ED, admitted by [facility]. 8/12: Pt seen awake resting in bed this AM. He erports N/V  has eased up some since yesterday. Denies feeling tremulous, sweaty or other symptoms of alcohol withdrawal.  8/13: Pt sleeping comfortably, having been given Ativan for CIWA score of 11 this AM.  He wakes briefly to voice, denies any complaints. States feeling a little better.  8/15.  Patient had upper endoscopy done which showed grade D esophagitis, large hiatal hernia and single nonbleeding angiectasia in the stomach treated with argon plasma. 8/16.  Patient's hemoglobin dipped down to 6.5.  Spoke with wife and consented patient for blood transfusion. 8/17.  Hemoglobin up to 10.7, creatinine 0.47, potassium 3.0, sodium 133 8/18.  Patient refused medications and not eating much.  Hemoglobin 9.7, potassium 3.2, total bilirubin 4.5, sodium 132.  Chest x-ray possible aspiration pneumonia.  Started on Unasyn. 8/19.  Patient actually able to participate with physical therapy today. 8/20.  Called this morning when patient had a fever and hypotensive and tachycardia.  Patient given sepsis fluid bolus.  Antibiotics changed over to Maxipime and Flagyl.  MRCP ordered.  Transfer to Stepdown with PCCM consultation for possible vasopressors 8/21: Hgb 6.8 1 unit of pRBC's ordered. Pt not requiring vasopressors.  MRCP concerning for acalculous cholecystitis or cholestasis stat HIDA scan pending and general surgery consulted by hospitalist team.  PCCM team will sign off   Interim History / Subjective:  Pt complains of generalized pain no other complaints at this time.  Objective   Blood pressure 94/62, pulse (!) 111, temperature 99.1 F (37.3 C), temperature source Oral, resp. rate (!) 25, height 5\' 9"  (1.753 m), weight 65 kg, SpO2 99%.        Intake/Output Summary (Last 24 hours) at 03/31/2023 8119 Last data filed at 03/31/2023 0424 Gross per 24 hour  Intake 2538.86 ml  Output 1100 ml  Net 1438.86 ml   Filed Weights   03/25/23 0959 03/29/23 0623 03/30/23 1855  Weight: 63.2 kg 64.9 kg 65 kg     Examination: General: Acute on chronically-ill appearing frail male, NAD on RA  HENT: Atraumatic, normocephalic, neck supple, no JVD Lungs: Clear throughout, even, non labored  Cardiovascular: NSR, s1s2, no r/g, 2+ radial/1+ distal pulses, no edema  Abdomen: +BS x4, soft, non tender, non distended  Extremities: Normal bulk and tone, no deformities Neuro: Alert and oriented, following commands, PERRLA  GU: External male catheter in place  Resolved Hospital Problem list     Assessment & Plan:   #Shock: Septic +/- Hypovolemic ~ IMPROVED PMHx: AAA s/p repair and stent, HTN, HLD, CAD, HFpEF, PVD Echocardiogram 08/24/22: LVEF 60-65%, Grade I DD, RV systolic function normal - Continuous cardiac monitoring - IV fluid resuscitation and prn levophed gtt to maintain map 65 or higher - Vasopressors as needed to maintain MAP goal - Continue midodrine @10  mg TID - Lactic acid normalized  - Consider Echocardiogram   #Severe Sepsis in setting of Aspiration Pneumonia & questionable intraabdominal process (Meets SIRS Criteria: fever, tachycardia, leukocytosis)  MRCP WO Contrast 08/20: Severe diffuse fatty infiltration of the liver. No focal hepatic lesions are identified without contrast.  Gallbladder wall thickening and pericholecystic inflammatory changes could suggest acalculus cholecystitis or cholestasis.  - Trend WBC and monitor fever curve  - Trend PCT  - Follow cultures  - Continue abx as outlined above - STAT HIDA scan pending and General Surgery consulted appreciate input   #Acute Blood Loss Anemia in setting of Acute GI Bleed EGD on 8/15 which showed grade D Esophagitis,large hiatal hernia, and angiectasia which was treated with argon plasma coagulation - Trend CBC - Monitor for s/sx of bleeding  - Transfuse for hgb <7 - Avoid chemical VTE px; SCD's for VTE px  - Continue protonix BID  #Transaminitis, suspect secondary to ETOH PMHx: Hepatic steatosis, transaminitis -Trend  LFT's and coags - MRCP concerning for possible acalculous cholecystitis or cholestasis Stat HIDA scan pending and general surgery consulted appreciate input    #Hyponatremia #Hypokalemia~resovled  #AG metabolic acidosis - Trend BMP - Replace electrolytes as indicated  - Monitor UOP - Avoid nephrotoxic medications   #Alcohol Withdrawal - Correct metabolic derangements as outlined above - Provide supportive care - Promote normal sleep/wake cycle and family presence - Avoid sedating medications as able - Continue thiamine, folic acid, MVI - Continue scheduled seroquel; prn librium per CIWA protocol  Best Practice (right click and "Reselect all SmartList Selections" daily)   Diet/type: DYS 3  DVT prophylaxis: SCD's  GI prophylaxis: PPI Lines: N/A Foley:  N/A Code Status:  full code Last date of multidisciplinary goals of care discussion [03/31/23]  08/21: Pt updated regarding current plan of care and all questions answered  Labs   CBC: Recent Labs  Lab 03/25/23 0335 03/26/23 0536 03/27/23 0500 03/28/23 0644 03/30/23 0522 03/30/23 1910 03/31/23 0453  WBC 6.4 8.7 10.3  --  14.0* 13.7* 12.6*  NEUTROABS 4.9 6.3 8.6*  --   --   --   --   HGB 8.2* 6.5* 10.7* 9.7* 9.1* 7.8* 6.8*  HCT 23.9* 17.7* 28.8*  --  27.1* 23.0* 20.1*  MCV 100.0 95.2 92.0  --  96.8 97.0 95.3  PLT 154 180 310  --  380 209 306    Basic Metabolic Panel: Recent Labs  Lab 03/25/23 0335 03/26/23 0536 03/27/23 0500 03/28/23 0644 03/30/23 0522 03/30/23 0842 03/30/23 1910 03/31/23 0453  NA 131*   < > 133* 132* 137 133* 133* 132*  K 3.8   < > 3.0* 3.2* 3.3* 2.8* 3.0* 3.6  CL 100   < > 102 101 105 105 101 107  CO2 22   < > 23 23 24  19* 22 20*  GLUCOSE 100*   < > 119* 102* 81 72 90 81  BUN <5*   < > 7* 12 5* 5* 5* 7*  CREATININE 0.47*   < > 0.47* 0.50* 0.43* 0.35* 0.42* 0.57*  CALCIUM 7.5*   < > 7.8* 7.4* 7.3* 6.9* 6.9* 6.6*  MG  --   --  1.7  --  2.2  --   --  1.8  PHOS 2.3*  --  2.3*  --  2.9  --    --  2.6   < > = values in this interval not displayed.   GFR: Estimated Creatinine Clearance: 88 mL/min (A) (by C-G formula based on SCr of 0.57 mg/dL (L)). Recent Labs  Lab 03/27/23 0500 03/30/23 0522 03/30/23 0830 03/30/23 1118 03/30/23 1453 03/30/23 1910 03/31/23 0453  PROCALCITON  --   --   --   --   --  1.64  --   WBC 10.3 14.0*  --   --   --  13.7* 12.6*  LATICACIDVEN  --   --  1.4 2.5* 1.2 1.5  --     Liver Function Tests: Recent Labs  Lab 03/28/23 0644 03/30/23 0522 03/30/23 0842 03/31/23 0453  AST 141* 126* 117* 83*  ALT 33 29 28 20   ALKPHOS 185* 224* 205* 167*  BILITOT 4.5* 5.5* 5.1* 4.7*  PROT 6.0* 5.9* 5.5* 4.9*  ALBUMIN 1.9* 1.6* 1.6* 1.7*   Recent Labs  Lab 03/30/23 0522  LIPASE 49   No results for input(s): "AMMONIA" in the last 168 hours.  ABG    Component Value Date/Time   PHART 7.47 (H) 03/30/2023 1739   PCO2ART 31 (L) 03/30/2023 1739   PO2ART 65 (L) 03/30/2023 1739   HCO3 22.6 03/30/2023 1739   ACIDBASEDEF 0.2 03/30/2023 1739   O2SAT 95.1 03/30/2023 1739     Coagulation Profile: Recent Labs  Lab 03/30/23 0522 03/31/23 0453  INR 1.3* 1.5*    Cardiac Enzymes: Recent Labs  Lab 03/29/23 1750  CKTOTAL 69    HbA1C: Hgb A1c MFr Bld  Date/Time Value Ref Range Status  03/21/2023 07:07 AM 4.4 (L) 4.8 - 5.6 % Final    Comment:    (NOTE)         Prediabetes: 5.7 - 6.4         Diabetes: >6.4         Glycemic control for adults with diabetes: <7.0     CBG: Recent Labs  Lab 03/30/23 1904  GLUCAP 87    Review of Systems:   Positives in BOLD Gen: generalized pain, fever, chills, weight change, fatigue, night sweats HEENT: Denies blurred vision, double vision, hearing loss, tinnitus, sinus congestion, rhinorrhea, sore throat, neck stiffness, dysphagia PULM: Denies shortness of breath, cough, sputum production, hemoptysis, wheezing CV: Denies chest pain, edema, orthopnea, paroxysmal nocturnal dyspnea, palpitations GI: Denies  abdominal pain, nausea, vomiting, diarrhea, hematochezia, melena, constipation, change in bowel habits GU: Denies dysuria, hematuria, polyuria, oliguria, urethral discharge Endocrine: Denies hot or cold intolerance, polyuria, polyphagia or appetite change Derm: Denies rash, dry skin, scaling or peeling skin change Heme: Denies easy bruising, bleeding, bleeding gums Neuro: Denies headache, numbness, weakness, slurred speech, loss of memory or consciousness  Past Medical History:  He,  has a past medical history of Anemia, Aortic atherosclerosis (HCC), Arthritis, Bilateral carpal tunnel syndrome, Coronary artery disease, Diastolic dysfunction, ETOH abuse, Hepatic steatosis, History of methicillin resistant staphylococcus aureus (MRSA), HLD (hyperlipidemia), HTN (hypertension), Infrarenal abdominal aortic aneurysm (AAA) without rupture (HCC) (05/28/2022), Nausea & vomiting (03/21/2023), PVD (peripheral vascular disease) with claudication (HCC), Sigmoid diverticulosis, and Transaminitis.   Surgical History:   Past Surgical History:  Procedure Laterality Date   CERVICAL LAMINOPLASTY  08/20/2021   right C3-C6   COLONOSCOPY     ENDOVASCULAR REPAIR/STENT GRAFT N/A 09/09/2022   Procedure: ENDOVASCULAR REPAIR/STENT GRAFT;  Surgeon: Renford Dills, MD;  Location: [facility] INVASIVE CV LAB;  Service: Cardiovascular;  Laterality: N/A;   ESOPHAGOGASTRODUODENOSCOPY N/A 03/25/2023   Procedure: ESOPHAGOGASTRODUODENOSCOPY (EGD);  Surgeon: Wyline Mood, MD;  Location: [facility] ENDOSCOPY;  Service: Gastroenterology;  Laterality: N/A;   HOT HEMOSTASIS  03/25/2023   Procedure: HOT HEMOSTASIS (ARGON PLASMA COAGULATION/BICAP);  Surgeon: Wyline Mood, MD;  Location: [facility] ENDOSCOPY;  Service: Gastroenterology;;   SKIN GRAFT     to hand and forearm     Social History:   reports that he has been smoking cigarettes. He has  a 4.5 pack-year smoking history. He has never used smokeless tobacco. He reports current alcohol use. He  reports current drug use. Drug: Marijuana.   Family History:  His family history includes Heart attack in his mother.   Allergies Allergies  Allergen Reactions   Sulfa Antibiotics Hives     Home Medications  Prior to Admission medications   Medication Sig Start Date End Date Taking? Authorizing Provider  acetaminophen (TYLENOL) 500 MG tablet Take one or two tablet by mouth every 6  hours as needed for pain 05/13/22  Yes   aspirin 81 MG chewable tablet Chew 81 mg by mouth daily as needed.   Yes [provider]  atorvastatin (LIPITOR) 40 MG tablet Take 1 tablet (40 mg total) by mouth daily. 08/13/22 08/08/23 Yes Agbor-Etang, Arlys John, MD  clopidogrel (PLAVIX) 75 MG tablet Take 1 tablet (75 mg total) by mouth daily. 09/10/22  Yes Pace, Brien R, NP  losartan (COZAAR) 25 MG tablet Take 1 tablet (25 mg total) by mouth daily. 08/13/22 08/08/23 Yes Agbor-Etang, Arlys John, MD  melatonin 3 MG TABS tablet Take 3 mg by mouth at bedtime.   Yes [provider]  senna-docusate (SENOKOT-S) 8.6-50 MG tablet Take 2 tablets by mouth at bedtime. For constipation.  Also available over-the-counter 05/30/22  Yes Rai, Ripudeep K, MD  traMADol (ULTRAM) 50 MG tablet Take 1 tablet (50 mg total) by mouth every 6 (six) hours as needed for up to 20 doses for moderate pain or severe pain. Patient not taking: Reported on 03/21/2023 05/30/22   Cathren Harsh, MD     Critical care time: 36 minutes     Zada Girt, AGNP  Pulmonary/Critical Care Pager [phone] (please enter 7 digits) PCCM Consult Pager [phone] (please enter 7 digits)

## 2023-04-01 ENCOUNTER — Other Ambulatory Visit: Payer: Self-pay | Admitting: Diagnostic Radiology

## 2023-04-01 ENCOUNTER — Encounter: Payer: Self-pay | Admitting: Internal Medicine

## 2023-04-01 ENCOUNTER — Inpatient Hospital Stay: Payer: Commercial Managed Care - PPO

## 2023-04-01 DIAGNOSIS — F10931 Alcohol use, unspecified with withdrawal delirium: Secondary | ICD-10-CM | POA: Diagnosis not present

## 2023-04-01 DIAGNOSIS — A419 Sepsis, unspecified organism: Secondary | ICD-10-CM | POA: Diagnosis not present

## 2023-04-01 DIAGNOSIS — K810 Acute cholecystitis: Secondary | ICD-10-CM

## 2023-04-01 DIAGNOSIS — R6520 Severe sepsis without septic shock: Secondary | ICD-10-CM | POA: Diagnosis not present

## 2023-04-01 DIAGNOSIS — Z515 Encounter for palliative care: Secondary | ICD-10-CM | POA: Diagnosis not present

## 2023-04-01 DIAGNOSIS — E8729 Other acidosis: Secondary | ICD-10-CM | POA: Diagnosis not present

## 2023-04-01 LAB — MYOGLOBIN, URINE: Myoglobin, Ur: <2 ng/mL (ref 0–13)

## 2023-04-01 LAB — COMPREHENSIVE METABOLIC PANEL
ALT: 23 U/L (ref 0–44)
AST: 92 U/L — ABNORMAL HIGH (ref 15–41)
Albumin: 1.7 g/dL — ABNORMAL LOW (ref 3.5–5.0)
Alkaline Phosphatase: 194 U/L — ABNORMAL HIGH (ref 38–126)
Anion gap: 7 (ref 5–15)
BUN: 6 mg/dL — ABNORMAL LOW (ref 8–23)
CO2: 20 mmol/L — ABNORMAL LOW (ref 22–32)
Calcium: 7.2 mg/dL — ABNORMAL LOW (ref 8.9–10.3)
Chloride: 109 mmol/L (ref 98–111)
Creatinine, Ser: 0.42 mg/dL — ABNORMAL LOW (ref 0.61–1.24)
GFR, Estimated: >60 mL/min (ref >60)
Glucose, Bld: 86 mg/dL (ref 70–99)
Potassium: 2.9 mmol/L — ABNORMAL LOW (ref 3.5–5.1)
Sodium: 136 mmol/L (ref 135–145)
Total Bilirubin: 5.2 mg/dL — ABNORMAL HIGH (ref 0.3–1.2)
Total Protein: 5.2 g/dL — ABNORMAL LOW (ref 6.5–8.1)

## 2023-04-01 LAB — CBC
HCT: 28 % — ABNORMAL LOW (ref 39.0–52.0)
Hemoglobin: 9.8 g/dL — ABNORMAL LOW (ref 13.0–17.0)
MCH: 32.3 pg (ref 26.0–34.0)
MCHC: 35 g/dL (ref 30.0–36.0)
MCV: 92.4 fL (ref 80.0–100.0)
Platelets: 326 10*3/uL (ref 150–400)
RBC: 3.03 MIL/uL — ABNORMAL LOW (ref 4.22–5.81)
RDW: 19.2 % — ABNORMAL HIGH (ref 11.5–15.5)
WBC: 13.7 10*3/uL — ABNORMAL HIGH (ref 4.0–10.5)
nRBC: 0 % (ref 0.0–0.2)

## 2023-04-01 LAB — TYPE AND SCREEN
ABO/RH(D): O POS
Antibody Screen: NEGATIVE
Unit division: 0

## 2023-04-01 LAB — BPAM RBC
Blood Product Expiration Date: 202409112359
ISSUE DATE / TIME: 202408211035
Unit Type and Rh: 5100

## 2023-04-01 LAB — HEMOGLOBIN AND HEMATOCRIT, BLOOD
HCT: 27.5 % — ABNORMAL LOW (ref 39.0–52.0)
Hemoglobin: 9.5 g/dL — ABNORMAL LOW (ref 13.0–17.0)

## 2023-04-01 MED ORDER — MORPHINE SULFATE (PF) 4 MG/ML IV SOLN
2.5000 mg | Freq: Once | INTRAVENOUS | Status: AC
  Administered 2023-04-01: 2.5 mg via INTRAVENOUS
  Filled 2023-04-01: qty 1

## 2023-04-01 MED ORDER — SODIUM CHLORIDE 0.9 % IV SOLN: INTRAVENOUS | Status: AC

## 2023-04-01 MED ORDER — LORAZEPAM 2 MG/ML IJ SOLN
1.0000 mg | Freq: Four times a day (QID) | INTRAMUSCULAR | Status: DC | PRN
  Administered 2023-04-01: 1 mg via INTRAVENOUS
  Filled 2023-04-01: qty 1

## 2023-04-01 MED ORDER — TECHNETIUM TC 99M MEBROFENIN IV KIT
7.7200 | PACK | Freq: Once | INTRAVENOUS | Status: AC | PRN
  Administered 2023-04-01: 7.72 via INTRAVENOUS

## 2023-04-01 MED ORDER — MORPHINE SULFATE (PF) 2 MG/ML IV SOLN
2.0000 mg | INTRAVENOUS | Status: DC | PRN
  Administered 2023-04-03 (×2): 2 mg via INTRAVENOUS
  Administered 2023-04-03: 1 mg via INTRAVENOUS
  Filled 2023-04-01 (×3): qty 1

## 2023-04-01 MED ORDER — POTASSIUM CHLORIDE 10 MEQ/100ML IV SOLN
10.0000 meq | INTRAVENOUS | Status: AC
  Administered 2023-04-01 (×4): 10 meq via INTRAVENOUS
  Filled 2023-04-01 (×4): qty 100

## 2023-04-01 MED ORDER — ALBUMIN HUMAN 25 % IV SOLN
25.0000 g | Freq: Once | INTRAVENOUS | Status: DC
Start: 2023-04-01 — End: 2023-04-01

## 2023-04-01 NOTE — Consult Note (Addendum)
Chief Complaint: Patient was seen in consultation today for cholecystitis at the request of Campbell Lerner, MD   Referring Physician(s): Campbell Lerner, MD   Supervising Physician: Irish Lack  Patient Status: [facility] - In-pt  History of Present Illness: Ryan Rose is a 62 y.o. male PMHX significant for AAA s/p repair, CAD, PVD, HTN, ETOH abuse who presented 8/11 with complaints of N/V, chest pain and abdominal pain. He underwent EGD with findings of esophagitis and nonbleeding angiectasia that was treated and transfusion for anemia, he also had experienced etoh withdrawal, aspiration pneumonia and now there is concern for sepsis with acalculous cholecystitis. Patient has had MRCP, NM scan and today RUQ [place]. Surgery has seen the patient and request received for IR evaluation for percutaneous cholecystostomy. History today is obtained per chart review, patient is unable to provide. Patient does answer no when I ask him if he has abdominal pain today. He does not respond or answer any other questions today.  Past Medical History:  Diagnosis Date   Anemia    Aortic atherosclerosis (HCC)    Arthritis    Bilateral carpal tunnel syndrome    Coronary artery disease    Diastolic dysfunction    a.) TTE 08/24/2022: EF 60-65%, mild-mod MR, G1DD.   ETOH abuse    Hepatic steatosis    History of methicillin resistant staphylococcus aureus (MRSA)    HLD (hyperlipidemia)    HTN (hypertension)    Infrarenal abdominal aortic aneurysm (AAA) without rupture (HCC) 05/28/2022   a.) CT AP 05/28/2022: saccular thrombosed; measures 4.4 x 3.2 cm   Nausea & vomiting 03/21/2023   PVD (peripheral vascular disease) with claudication (HCC)    Sigmoid diverticulosis    Transaminitis     Past Surgical History:  Procedure Laterality Date   CERVICAL LAMINOPLASTY  08/20/2021   right C3-C6   COLONOSCOPY     ENDOVASCULAR REPAIR/STENT GRAFT N/A 09/09/2022   Procedure: ENDOVASCULAR REPAIR/STENT  GRAFT;  Surgeon: Renford Dills, MD;  Location: [facility] INVASIVE CV LAB;  Service: Cardiovascular;  Laterality: N/A;   ESOPHAGOGASTRODUODENOSCOPY N/A 03/25/2023   Procedure: ESOPHAGOGASTRODUODENOSCOPY (EGD);  Surgeon: Wyline Mood, MD;  Location: [facility] ENDOSCOPY;  Service: Gastroenterology;  Laterality: N/A;   HOT HEMOSTASIS  03/25/2023   Procedure: HOT HEMOSTASIS (ARGON PLASMA COAGULATION/BICAP);  Surgeon: Wyline Mood, MD;  Location: [facility] ENDOSCOPY;  Service: Gastroenterology;;   SKIN GRAFT     to hand and forearm    Allergies: Sulfa antibiotics  Medications: Prior to Admission medications   Medication Sig Start Date End Date Taking? Authorizing Provider  acetaminophen (TYLENOL) 500 MG tablet Take one or two tablet by mouth every 6  hours as needed for pain 05/13/22  Yes   aspirin 81 MG chewable tablet Chew 81 mg by mouth daily as needed.   Yes [provider]  atorvastatin (LIPITOR) 40 MG tablet Take 1 tablet (40 mg total) by mouth daily. 08/13/22 08/08/23 Yes Agbor-Etang, Arlys John, MD  clopidogrel (PLAVIX) 75 MG tablet Take 1 tablet (75 mg total) by mouth daily. 09/10/22  Yes Pace, Brien R, NP  losartan (COZAAR) 25 MG tablet Take 1 tablet (25 mg total) by mouth daily. 08/13/22 08/08/23 Yes Agbor-Etang, Arlys John, MD  melatonin 3 MG TABS tablet Take 3 mg by mouth at bedtime.   Yes [provider]  senna-docusate (SENOKOT-S) 8.6-50 MG tablet Take 2 tablets by mouth at bedtime. For constipation.  Also available over-the-counter 05/30/22  Yes Rai, Ripudeep K, MD  traMADol (ULTRAM) 50 MG tablet Take  1 tablet (50 mg total) by mouth every 6 (six) hours as needed for up to 20 doses for moderate pain or severe pain. Patient not taking: Reported on 03/21/2023 05/30/22   Cathren Harsh, MD     Family History  Problem Relation Age of Onset   Heart attack Mother     Social History   Socioeconomic History   Marital status: Married    Spouse name: [name]   Number of children: Not on file    Years of education: Not on file   Highest education level: Not on file  Occupational History   Not on file  Tobacco Use   Smoking status: Some Days    Current packs/day: 0.10    Average packs/day: 0.1 packs/day for 45.0 years (4.5 ttl pk-yrs)    Types: Cigarettes   Smokeless tobacco: Never   Tobacco comments:    Smokes 5 cigarettes weekly  Vaping Use   Vaping status: Never Used  Substance and Sexual Activity   Alcohol use: Yes    Comment: occasional   Drug use: Yes    Types: Marijuana    Comment: occasionally   Sexual activity: Not on file  Other Topics Concern   Not on file  Social History Narrative   Not on file   Social Determinants of Health   Financial Resource Strain: Not on file  Food Insecurity: Patient Declined (03/21/2023)   Hunger Vital Sign    Worried About Running Out of Food in the Last Year: Patient declined    Ran Out of Food in the Last Year: Patient declined  Transportation Needs: No Transportation Needs (03/21/2023)   PRAPARE - Administrator, Civil Service (Medical): No    Lack of Transportation (Non-Medical): No  Physical Activity: Not on file  Stress: Not on file  Social Connections: Not on file    Review of Systems: A 12 point ROS discussed and pertinent positives are indicated in the HPI above.  All other systems are negative.  Review of Systems  Vital Signs: BP 99/68   Pulse (!) 104   Temp 98.3 F (36.8 C) (Oral)   Resp (!) 28   Ht 5\' 9"  (1.753 m)   Wt 143 lb 4.8 oz (65 kg)   SpO2 91%   BMI 21.16 kg/m   Physical Exam Constitutional:      Comments: In and out of sleep, intermittently shakes heads yes or no to questions  Cardiovascular:     Rate and Rhythm: Regular rhythm. Tachycardia present.  Pulmonary:     Effort: Pulmonary effort is normal. No respiratory distress.  Abdominal:     General: There is distension.     Palpations: Abdomen is soft.     Tenderness: There is no abdominal tenderness.  Neurological:      Mental Status: He is disoriented.     Imaging: NM Hepatobiliary Liver Func  Result Date: 04/01/2023 CLINICAL DATA:  Cholecystitis EXAM: NUCLEAR MEDICINE HEPATOBILIARY IMAGING TECHNIQUE: Sequential images of the abdomen were obtained out to 60 minutes following intravenous administration of radiopharmaceutical. RADIOPHARMACEUTICALS:  7.7 mCi Tc-41m  Choletec IV 2.5 mg morphine IV COMPARISON:  MRCP 03/30/2023 FINDINGS: Moderate delay in uptake of radiopharmaceutical from the blood pool with blood pool still visible at 30 minutes. Biliary and bowel activity at 15 minutes indicating patency of the common hepatic duct and common bile duct. At 50 minutes, there is no gallbladder excretion. Subsequently, 2.5 mg of IV morphine was administered to the patient, with  subsequent imaging demonstrating only biliary and bowel activity without readily appreciable gallbladder excretion. This appearance favors obstruction of the cystic duct and acute cholecystitis. IMPRESSION: 1. Failure of filling of the gallbladder even after IV morphine administration, most compatible with obstruction of the cystic duct and acute cholecystitis. 2. Persistent blood pool activity in a manner favoring mild to moderate hepatocellular dysfunction. Electronically Signed   By: Gaylyn Rong M.D.   On: 04/01/2023 12:53   MR 3D Recon At Scanner  Result Date: 03/30/2023 CLINICAL DATA:  Right upper quadrant abdominal pain. EXAM: MRI ABDOMEN WITHOUT CONTRAST  (INCLUDING MRCP) TECHNIQUE: Multiplanar multisequence MR imaging of the abdomen was performed. Heavily T2-weighted images of the biliary and pancreatic ducts were obtained, and three-dimensional MRCP images were rendered by post processing. COMPARISON:  CT scan 03/21/2023 FINDINGS: Lower chest: Small bilateral pleural effusions with overlying atelectasis. No pericardial effusion. There is also a cystic appearing fluid collection near the distal esophagus on the right side. This was not  present on the prior CT scan and could be loculated pleural fluid. Hepatobiliary: Severe diffuse fatty infiltration of the liver. No focal hepatic lesions are identified without contrast. No intrahepatic biliary dilatation. The gallbladder is distended measuring 5.1 cm in the transverse dimension. There is gallbladder wall thickening and pericholecystic inflammatory changes. There is layering high T1 material which is likely sludge. No definite gallstones. Normal caliber and course of the common bile duct. Pancreas:  No mass, inflammation or ductal dilatation. Spleen:  Normal size.  No focal lesions. Adrenals/Urinary Tract: The adrenal glands and kidneys are unremarkable. No renal lesions or hydronephrosis. Stomach/Bowel: The stomach, duodenum, visualized small bowel and visualized colon are unremarkable. Vascular/Lymphatic: Atherosclerotic disease involving the aorta with aortoiliac stent graft in place. No mesenteric or retroperitoneal mass or adenopathy. Other:  Moderate mesenteric edema and small volume ascites. Musculoskeletal: No significant bony findings. IMPRESSION: 1. Severe diffuse fatty infiltration of the liver. No focal hepatic lesions are identified without contrast. 2. Distended gallbladder with layering sludge. No definite gallstones. Gallbladder wall thickening and pericholecystic inflammatory changes could suggest acalculus cholecystitis or cholestasis. Normal caliber and course of the common bile duct. 3. Small bilateral pleural effusions with overlying atelectasis. There is also a cystic appearing fluid collection near the distal esophagus on the right side. This was not present on the prior CT scan and could be loculated pleural fluid. 4. Moderate mesenteric edema and small volume ascites. Electronically Signed   By: Rudie Meyer M.D.   On: 03/30/2023 19:00   MR ABDOMEN MRCP WO CONTRAST  Result Date: 03/30/2023 CLINICAL DATA:  Right upper quadrant abdominal pain. EXAM: MRI ABDOMEN WITHOUT  CONTRAST  (INCLUDING MRCP) TECHNIQUE: Multiplanar multisequence MR imaging of the abdomen was performed. Heavily T2-weighted images of the biliary and pancreatic ducts were obtained, and three-dimensional MRCP images were rendered by post processing. COMPARISON:  CT scan 03/21/2023 FINDINGS: Lower chest: Small bilateral pleural effusions with overlying atelectasis. No pericardial effusion. There is also a cystic appearing fluid collection near the distal esophagus on the right side. This was not present on the prior CT scan and could be loculated pleural fluid. Hepatobiliary: Severe diffuse fatty infiltration of the liver. No focal hepatic lesions are identified without contrast. No intrahepatic biliary dilatation. The gallbladder is distended measuring 5.1 cm in the transverse dimension. There is gallbladder wall thickening and pericholecystic inflammatory changes. There is layering high T1 material which is likely sludge. No definite gallstones. Normal caliber and course of the common bile duct. Pancreas:  No mass, inflammation or ductal dilatation. Spleen:  Normal size.  No focal lesions. Adrenals/Urinary Tract: The adrenal glands and kidneys are unremarkable. No renal lesions or hydronephrosis. Stomach/Bowel: The stomach, duodenum, visualized small bowel and visualized colon are unremarkable. Vascular/Lymphatic: Atherosclerotic disease involving the aorta with aortoiliac stent graft in place. No mesenteric or retroperitoneal mass or adenopathy. Other:  Moderate mesenteric edema and small volume ascites. Musculoskeletal: No significant bony findings. IMPRESSION: 1. Severe diffuse fatty infiltration of the liver. No focal hepatic lesions are identified without contrast. 2. Distended gallbladder with layering sludge. No definite gallstones. Gallbladder wall thickening and pericholecystic inflammatory changes could suggest acalculus cholecystitis or cholestasis. Normal caliber and course of the common bile duct. 3.  Small bilateral pleural effusions with overlying atelectasis. There is also a cystic appearing fluid collection near the distal esophagus on the right side. This was not present on the prior CT scan and could be loculated pleural fluid. 4. Moderate mesenteric edema and small volume ascites. Electronically Signed   By: Rudie Meyer M.D.   On: 03/30/2023 19:00   DG Chest Port 1 View  Result Date: 03/28/2023 CLINICAL DATA:  Fever and altered mental status EXAM: PORTABLE CHEST 1 VIEW COMPARISON:  Chest radiograph dated March 25, 2023 FINDINGS: Cardiac and mediastinal contours are within normal limits. Elevation of the right hemidiaphragm. Mild bibasilar opacities. No large pleural effusion or evidence of pneumothorax. IMPRESSION: Mild bibasilar opacities, likely atelectasis, although infection or aspiration could appear similar. Electronically Signed   By: Allegra Lai M.D.   On: 03/28/2023 12:32   DG Chest Port 1 View  Result Date: 03/25/2023 CLINICAL DATA:  Fever EXAM: PORTABLE CHEST 1 VIEW COMPARISON:  None Available. FINDINGS: Lungs are clear.  No pleural effusion or pneumothorax. The heart is normal in size. IMPRESSION: No acute cardiopulmonary disease. Electronically Signed   By: Charline Bills M.D.   On: 03/25/2023 17:04   DG Wrist 2 Views Left  Result Date: 03/21/2023 CLINICAL DATA:  Wrist pain, no injury EXAM: LEFT WRIST - 2 VIEW COMPARISON:  None Available. FINDINGS: No fracture or dislocation is seen. Degenerative changes with radiocarpal narrowing. The visualized soft tissues are unremarkable. IMPRESSION: No fracture or dislocation is seen. Degenerative changes, as above. Electronically Signed   By: Charline Bills M.D.   On: 03/21/2023 20:34   DG Elbow 2 Views Left  Result Date: 03/21/2023 CLINICAL DATA:  [id number] Arm pain [id number] EXAM: LEFT ELBOW - 2 VIEW COMPARISON:  None Available. FINDINGS: No cortical erosion or destruction. There is no evidence of fracture, dislocation, or joint  effusion. There is no evidence of arthropathy or other focal bone abnormality. Partially visualized diffuse anterior subcutaneus soft tissue edema and emphysema. IMPRESSION: 1. No acute displaced fracture or dislocation. 2. No radiographic findings suggest osteomyelitis. 3. Partially visualized diffuse anterior subcutaneus soft tissue edema and emphysema. Electronically Signed   By: Tish Frederickson M.D.   On: 03/21/2023 19:37   CT Angio Chest/Abd/Pel for Dissection W and/or Wo Contrast  Result Date: 03/21/2023 CLINICAL DATA:  Chest pain, nausea and vomiting. Acute aortic syndrome (AAS) suspected EXAM: CT ANGIOGRAPHY CHEST, ABDOMEN AND PELVIS TECHNIQUE: Non-contrast CT of the chest was initially obtained. Multidetector CT imaging through the chest, abdomen and pelvis was performed using the standard protocol during bolus administration of intravenous contrast. Multiplanar reconstructed images and MIPs were obtained and reviewed to evaluate the vascular anatomy. RADIATION DOSE REDUCTION: This exam was performed according to the departmental dose-optimization program which includes automated exposure control, adjustment  of the mA and/or kV according to patient size and/or use of iterative reconstruction technique. CONTRAST:  80mL OMNIPAQUE IOHEXOL 350 MG/ML SOLN COMPARISON:  05/28/2022 CT abdomen/pelvis. FINDINGS: CTA CHEST FINDINGS Cardiovascular: Normal heart size. No significant pericardial effusion/thickening. Three-vessel coronary atherosclerosis. Atherosclerotic nonaneurysmal thoracic aorta. No evidence of thoracic aortic dissection, intramural hematoma, pseudoaneurysm or penetrating atherosclerotic ulcer. Normal caliber pulmonary arteries. No central pulmonary emboli. Mediastinum/Nodes: No significant thyroid nodules. Prominent diffuse circumferential wall thickening in the thoracic esophagus with associated haziness of the surrounding mesenteric fat. No pathologically enlarged axillary, mediastinal or hilar  lymph nodes. Lungs/Pleura: No pneumothorax. No pleural effusion. Moderate centrilobular and paraseptal emphysema with diffuse bronchial wall thickening. A few scattered tiny solid pulmonary nodules in the peripheral upper lobes bilaterally, largest 0.2 cm anteriorly in the right upper lobe (series 7/image 34). No acute consolidative airspace disease, lung masses or additional significant pulmonary nodules. Several scattered curvilinear parenchymal bands throughout the mid to lower lungs bilaterally. Musculoskeletal: No aggressive appearing focal osseous lesions. Moderate thoracic spondylosis. Review of the MIP images confirms the above findings. CTA ABDOMEN AND PELVIS FINDINGS VASCULAR Aorta: Atherosclerotic abdominal aorta with 5.8 x 3.9 cm infrarenal left eccentric saccular abdominal aortic aneurysm status post aorto bi-iliac graft repair with no evidence of graft complication. No abdominal aortic dissection or penetrating atherosclerotic ulcer. Celiac: Patent without evidence of aneurysm, dissection, vasculitis or significant stenosis. SMA: Patent without evidence of aneurysm, dissection, vasculitis or significant stenosis. Renals: Both renal arteries are patent without evidence of aneurysm, dissection, vasculitis, fibromuscular dysplasia or significant stenosis. IMA: Occluded. Inflow: Heavily atherosclerotic bilateral common and external iliac arteries particularly in the right external iliac artery, without high-grade stenoses. Veins: No obvious venous abnormality within the limitations of this arterial phase study. Review of the MIP images confirms the above findings. NON-VASCULAR Hepatobiliary: Severe diffuse hepatic steatosis. No definite liver surface irregularity. No liver masses. Normal gallbladder with no radiopaque cholelithiasis. No biliary ductal dilatation. Pancreas: Normal, with no mass or duct dilation. Spleen: Normal size. No mass. Adrenals/Urinary Tract: Normal adrenals. No hydronephrosis. No  contour deforming renal masses. Nonspecific symmetric bilateral perinephric fat stranding and ill-defined fluid. Distended and otherwise normal bladder. Stomach/Bowel: Normal non-distended stomach. Normal caliber small bowel with no small bowel wall thickening. Normal appendix. Marked sigmoid diverticulosis with a dominant large posterior sigmoid diverticulum with associated prominent wall thickening and mild pericolonic fat stranding, similar in appearance to 05/28/2022 CT. Vascular/Lymphatic: No pathologically enlarged lymph nodes in the abdomen or pelvis. Reproductive: Normal size prostate. Other: No pneumoperitoneum, ascites or focal fluid collection. Musculoskeletal: No aggressive appearing focal osseous lesions. Mild lumbar spondylosis. Review of the MIP images confirms the above findings. IMPRESSION: 1. No acute aortic syndrome. 2. Infrarenal 5.8 cm saccular abdominal aortic aneurysm status post aorto bi-iliac graft repair with no evidence of complication. 3. Prominent diffuse circumferential wall thickening in the thoracic esophagus with associated haziness of the surrounding mesenteric fat, suggesting nonspecific esophagitis, with esophageal neoplasm not excluded. GI consultation advised for consideration of upper endoscopic correlation. 4. Marked sigmoid diverticulosis with a dominant large posterior sigmoid diverticulum with associated prominent wall thickening and mild pericolonic fat stranding, similar in appearance to 05/28/2022 CT, suggesting acute on chronic diverticulitis. No free air. No abscess. Please ensure patient is up-to-date with colon screening to exclude underlying colonic neoplasm. 5. Severe diffuse hepatic steatosis. 6. Three-vessel coronary atherosclerosis. 7. Moderate centrilobular and paraseptal emphysema with diffuse bronchial wall thickening, suggesting COPD. 8. Tiny scattered solid pulmonary nodules in the upper lobes bilaterally, largest 0.2  cm. Per Fleischner Society Guidelines,  a non-contrast Chest CT at 12 months is optional. If performed and the nodule is stable at 12 months, no further follow-up is recommended. These guidelines do not apply to immunocompromised patients and patients with cancer. Follow up in patients with significant comorbidities as clinically warranted. For lung cancer screening, adhere to Lung-RADS guidelines. Reference: Radiology. 2017; 284(1):228-43. 9. Aortic Atherosclerosis (ICD10-I70.0) and Emphysema (ICD10-J43.9). Electronically Signed   By: Delbert Phenix M.D.   On: 03/21/2023 08:40    Labs:  CBC: Recent Labs    03/30/23 0522 03/30/23 1910 03/31/23 0453 04/01/23 0810  WBC 14.0* 13.7* 12.6* 13.7*  HGB 9.1* 7.8* 6.8* 9.8*  HCT 27.1* 23.0* 20.1* 28.0*  PLT 380 209 306 326    COAGS: Recent Labs    05/28/22 1840 09/09/22 1358 03/21/23 0649 03/30/23 0522 03/31/23 0453  INR 1.0 1.2 1.1 1.3* 1.5*  APTT 28 >200*  --   --   --     BMP: Recent Labs    03/30/23 0842 03/30/23 1910 03/31/23 0453 04/01/23 0810  NA 133* 133* 132* 136  K 2.8* 3.0* 3.6 2.9*  CL 105 101 107 109  CO2 19* 22 20* 20*  GLUCOSE 72 90 81 86  BUN 5* 5* 7* 6*  CALCIUM 6.9* 6.9* 6.6* 7.2*  CREATININE 0.35* 0.42* 0.57* 0.42*  GFRNONAA >60 >60 >60 >60    LIVER FUNCTION TESTS: Recent Labs    03/30/23 0522 03/30/23 0842 03/31/23 0453 04/01/23 0810  BILITOT 5.5* 5.1* 4.7* 5.2*  AST 126* 117* 83* 92*  ALT 29 28 20 23   ALKPHOS 224* 205* 167* 194*  PROT 5.9* 5.5* 4.9* 5.2*  ALBUMIN 1.6* 1.6* 1.7* 1.7*    Assessment and Plan: This is a 62 year old male with PMHX significant for AAA s/p repair, CAD, PVD, HTN, ETOH abuse who presented 8/11 with complaints of N/V, chest pain and abdominal pain. He underwent EGD with findings of esophagitis and nonbleeding angiectasia that was treated and transfusion for anemia, he also had experienced etoh withdrawal, aspiration pneumonia and now there is concern for sepsis with acalculous cholecystitis. Patient has had  MRCP, NM scan and today RUQ [place]. Surgery has seen the patient and request received for IR evaluation for percutaneous cholecystostomy. Leukocytosis and febrile, currently on IV Cefepime and Flagyl.  Imaging, labs and vitals have been reviewed with my attending, Dr. Fredia Sorrow. GB does look edematous and inflamed on imaging, but no sonographic murphy's sign or cholelithiasis. Patient VSS currently without vasopressor support. We will keep the patient NPO after midnight and evaluate him tomorrow 8/23 am. If the patient clinically worsens, we will proceed with percutaneous cholecystostomy tube. New labs ordered for tomorrow. Informed consent will need to be obtained with a family member if decision is made to proceed tomorrow with the procedure.   Thank you for this interesting consult.  I greatly enjoyed meeting Ryan Rose and look forward to participating in their care.  A copy of this report was sent to the requesting provider on this date.  Electronically Signed: Berneta Levins, PA-C 04/01/2023, 3:21 PM   I spent a total of 20 Minutes in face to face in clinical consultation, greater than 50% of which was counseling/coordinating care for cholecystitis.

## 2023-04-01 NOTE — Plan of Care (Signed)

## 2023-04-01 NOTE — Evaluation (Signed)
Physical Therapy Re-Evaluation Patient Details Name: Ryan Rose MRN: [id number] DOB: [birth year] Today's Date: 04/01/2023  History of Present Illness  Pt is a 62 y/o M admitted on 03/21/23 after presenting with c/c of N&V, chest & abdominal pain. Pt is being treated for alcohol withdrawal delirium, ABLA, acute upper GI bleed. PMH: AAA s/p repair & stenting, alcohol abuse, CAD, HTN, PVD  Clinical Impression  Patient seen for re-evaluation 2/2 change in status and transfer to ICU. Patient continues to require maxA for bed mobility and resisting therapist at times 2/2 cognitive status. Oriented to self only. Follows commands intermittently and fluctuates between mild argumentative state to cooperative state. Discharge plan remains appropriate.         If plan is discharge home, recommend the following: Two people to help with walking and/or transfers;Two people to help with bathing/dressing/bathroom;Help with stairs or ramp for entrance;Direct supervision/assist for medications management;Assist for transportation;Direct supervision/assist for financial management;Assistance with cooking/housework   Can travel by private vehicle   No    Equipment Recommendations Other (comment) (defer to next venue of care)  Recommendations for Other Services       Functional Status Assessment Patient has had a recent decline in their functional status and demonstrates the ability to make significant improvements in function in a reasonable and predictable amount of time.     Precautions / Restrictions Precautions Precautions: Fall Restrictions Weight Bearing Restrictions: No      Mobility  Bed Mobility Overal bed mobility: Needs Assistance Bed Mobility: Rolling, Supine to Sit, Sit to Supine Rolling: Max assist, Used rails   Supine to sit: Max assist Sit to supine: Max assist   General bed mobility comments: patient resisting at times and cognition limiting progression off of EOB     Transfers                        Ambulation/Gait                  Stairs            Wheelchair Mobility     Tilt Bed    Modified Rankin (Stroke Patients Only)       Balance Overall balance assessment: Needs assistance Sitting-balance support: Feet supported, Bilateral upper extremity supported Sitting balance-Leahy Scale: Poor Sitting balance - Comments: poor awareness. Posterior lean with adding task while sitting EOB Postural control: Posterior lean                                   Pertinent Vitals/Pain Pain Assessment Pain Assessment: No/denies pain    Home Living Family/patient expects to be discharged to:: Unsure                        Prior Function Prior Level of Function : Patient poor historian/Family not available                     Extremity/Trunk Assessment   Upper Extremity Assessment Upper Extremity Assessment: Generalized weakness;Difficult to assess due to impaired cognition    Lower Extremity Assessment Lower Extremity Assessment: Generalized weakness;Difficult to assess due to impaired cognition    Cervical / Trunk Assessment Cervical / Trunk Assessment: Kyphotic  Communication   Communication Communication: No apparent difficulties  Cognition Arousal: Alert Behavior During Therapy: WFL for tasks assessed/performed Overall Cognitive Status: No family/caregiver present to  determine baseline cognitive functioning Area of Impairment: Orientation, Attention, Memory, Following commands, Safety/judgement, Problem solving, Awareness                 Orientation Level: Disoriented to, Place, Time, Situation Current Attention Level: Focused Memory: Decreased recall of precautions, Decreased short-term memory Following Commands: Follows one step commands inconsistently, Follows one step commands with increased time Safety/Judgement: Decreased awareness of safety, Decreased awareness  of deficits Awareness: Intellectual Problem Solving: Decreased initiation, Difficulty sequencing, Requires verbal cues, Requires tactile cues, Slow processing General Comments: Internally distracted        General Comments      Exercises     Assessment/Plan    PT Assessment Patient needs continued PT services  PT Problem List Decreased strength;Decreased cognition;Decreased activity tolerance;Decreased balance;Decreased mobility;Decreased safety awareness;Decreased knowledge of use of DME       PT Treatment Interventions DME instruction;Balance training;Gait training;Neuromuscular re-education;Stair training;Functional mobility training;Patient/family education;Therapeutic activities;Manual techniques;Therapeutic exercise;Cognitive remediation;Modalities    PT Goals (Current goals can be found in the Care Plan section)  Acute Rehab PT Goals Patient Stated Goal: none stated PT Goal Formulation: Patient unable to participate in goal setting Time For Goal Achievement: 04/12/23 Potential to Achieve Goals: Fair    Frequency Min 1X/week     Co-evaluation               AM-PAC PT "6 Clicks" Mobility  Outcome Measure Help needed turning from your back to your side while in a flat bed without using bedrails?: A Lot Help needed moving from lying on your back to sitting on the side of a flat bed without using bedrails?: Total Help needed moving to and from a bed to a chair (including a wheelchair)?: Total Help needed standing up from a chair using your arms (e.g., wheelchair or bedside chair)?: Total Help needed to walk in hospital room?: Total Help needed climbing 3-5 steps with a railing? : Total 6 Click Score: 7    End of Session   Activity Tolerance: Patient tolerated treatment well Patient left: in bed;with call bell/phone within reach;with nursing/sitter in room;with bed alarm set Nurse Communication: Mobility status PT Visit Diagnosis: Muscle weakness (generalized)  (M62.81);Difficulty in walking, not elsewhere classified (R26.2);Other abnormalities of gait and mobility (R26.89)    Time: 4132-4401 PT Time Calculation (min) (ACUTE ONLY): 24 min   Charges:   PT Evaluation $PT Re-evaluation: 1 Re-eval PT Treatments $Therapeutic Activity: 8-22 mins PT General Charges $$ ACUTE PT VISIT: 1 Visit         Maylon Peppers, PT, DPT Physical Therapist - [facility]  [facility]   Jema Deegan A Oveda Dadamo 04/01/2023, 11:28 AM

## 2023-04-01 NOTE — Progress Notes (Signed)
Pt okay to travel for HIDA Scan without nurse per Dr. Allena Katz.

## 2023-04-01 NOTE — Consult Note (Signed)
Consultation Note Date: 04/01/2023 at 1000  Patient Name: Ryan Rose  DOB: 08-Mar-1961  MRN: [id number]  Age / Sex: 62 y.o., male  PCP: Ryan Monday, MD Referring Physician: Enedina Finner, MD  Reason for Consultation: Establishing goals of care  HPI/Patient Profile: 62 y.o. male  with past medical history of AAA status post repair and stenting, alcohol abuse, CAD, HTN, PVD admitted on 03/21/2023 with N/V/CP/abd pain.  Patient is being treated for severe sepsis with acalculous cholecystitis, alcohol withdrawal delirium, blood loss anemia, and aspiration pneumonia.  PMT was consulted to discuss goals of care.  Clinical Assessment and Goals of Care: I have reviewed medical records including EPIC notes, labs and imaging, aand assessed the patient.  Patient is unable to participate in goals of care medical decision making independently at this time.   After assessing the patient, I spoke with patient's wife Ryan Rose over the phone to discuss diagnosis prognosis, GOC, EOL wishes, disposition and options.  I introduced Palliative Medicine as specialized medical care for people living with serious illness. It focuses on providing relief from the symptoms and stress of a serious illness. The goal is to improve quality of life for both the patient and the family.  We discussed a brief life review of the patient.  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  He and Ryan Rose have been married for 35 years.  They have no children.  Ryan Rose recently moved out of their home (2-3 weeks ago) d/t negative behaviors from patient's excessive alcohol consumption.  As far as functional and nutritional status Ryan Rose endorses patient was not able to take care of himself PTA.  He would frequently call out her name to assist him with ADLs.  We discussed patient's current illness and what it  means in the larger context of patient's on-going co-morbidities.  Natural disease trajectory of alcohol use disorder discussed.  I attempted to elicit values and goals of care important to the patient.  Ryan Rose shares that patient would not want to have his life prolonged or be kept alive by machines.  She says what is important to him is that he continue to drink.  She shares he has said several times in the past that he is going to "drink until I die".  Ryan Rose's goal for Ryan Rose is that he get to a place where he can be cared for as she knows he cannot return to live independently.   Advance directives, concepts specific to code status, artificial feeding and hydration, and rehospitalization were considered and discussed.  Patient has never created an advanced directive.  In the absence of HCPOA or legal guardianship, Ryan Rose is the next of kin decision maker as patient's wife.  Status discussed.  In the event of a cardiopulmonary arrest, Ryan Rose is confident that Ryan Rose would not want resuscitative efforts to ever be placed on a ventilator.  We discussed to reading the treatable up until the point of requiring mechanical ventilatory support.  Ryan Rose in agreement  to change CODE STATUS to DNR with limited interventions.  Attending and RN made aware of CODE STATUS change.   Discussed with patient/family the importance of continued conversation with family and the medical providers regarding overall plan of care and treatment options, ensuring decisions are within the context of the patient's values and GOCs.    Given that Ryan Rose is recently estranged from [place], she is considering allowing his siblings to be his next of kin decision maker.  However, at the moment she is prepared and in agreement to continue to make medical decisions for him.  PMT will continue to follow and support patient and family throughout his hospitalization.  Questions and concerns were addressed. Ryan Rose was encouraged to call with  questions or concerns.   Primary Decision Maker NEXT OF KIN  Physical Exam Vitals reviewed.  Constitutional:      General: He is not in acute distress.    Appearance: He is not ill-appearing.  HENT:     Head: Normocephalic.  Cardiovascular:     Rate and Rhythm: Normal rate.     Heart sounds: Normal heart sounds.  Abdominal:     General: Bowel sounds are normal.  Skin:    General: Skin is warm and dry.  Psychiatric:        Behavior: Behavior is agitated.     Palliative Assessment/Data: 30%     Thank you for this consult. Palliative medicine will continue to follow and assist holistically.   Time Total: 75 minutes  Signed by: Ryan Cocker, DNP, FNP-BC Palliative Medicine    Please contact Palliative Medicine Team phone at [phone] for questions and concerns.  For individual provider: See Ryan Rose

## 2023-04-01 NOTE — Progress Notes (Addendum)
[facility]  - [place] [facility]   PATIENT NAME: Ryan Rose    MR#:  [id number]  DATE OF BIRTH:  05/04/1961  SUBJECTIVE:  patient seen earlier. Resting quietly. Spoke with patient's spouse Ryan Rose on the phone and updated earlier. Patient not the best historian although did give some basic answers appropriately. Worried about his lost wallet. Denies any abdominal pain. VITALS:  Blood pressure 93/69, pulse (!) 102, temperature 98.3 F (36.8 C), temperature source Oral, resp. rate (!) 31, height 5\' 9"  (1.753 m), weight 65 kg, SpO2 93%.  PHYSICAL EXAMINATION:   GENERAL:  62 y.o.-year-old patient with no acute distress. Chronically ill LUNGS: Normal breath sounds bilaterally, no wheezing CARDIOVASCULAR: S1, S2 normal. No murmur, tachycardia ABDOMEN: Soft, nontender, nondistended. No guarding rigidity EXTREMITIES: No  edema b/l.    NEUROLOGIC: nonfocal  patient is alert and awake, pleasantly confused   LABORATORY PANEL:  CBC Recent Labs  Lab 04/01/23 0810  WBC 13.7*  HGB 9.8*  HCT 28.0*  PLT 326    Chemistries  Recent Labs  Lab 03/31/23 0453 04/01/23 0810  NA 132* 136  K 3.6 2.9*  CL 107 109  CO2 20* 20*  GLUCOSE 81 86  BUN 7* 6*  CREATININE 0.57* 0.42*  CALCIUM 6.6* 7.2*  MG 1.8  --   AST 83* 92*  ALT 20 23  ALKPHOS 167* 194*  BILITOT 4.7* 5.2*   Cardiac Enzymes No results for input(s): "TROPONINI" in the last 168 hours. RADIOLOGY:  NM Hepatobiliary Liver Func  Result Date: 04/01/2023 CLINICAL DATA:  Cholecystitis EXAM: NUCLEAR MEDICINE HEPATOBILIARY IMAGING TECHNIQUE: Sequential images of the abdomen were obtained out to 60 minutes following intravenous administration of radiopharmaceutical. RADIOPHARMACEUTICALS:  7.7 mCi Tc-35m  Choletec IV 2.5 mg morphine IV COMPARISON:  MRCP 03/30/2023 FINDINGS: Moderate delay in uptake of radiopharmaceutical from the blood pool with blood pool still visible at 30 minutes. Biliary and bowel activity  at 15 minutes indicating patency of the common hepatic duct and common bile duct. At 50 minutes, there is no gallbladder excretion. Subsequently, 2.5 mg of IV morphine was administered to the patient, with subsequent imaging demonstrating only biliary and bowel activity without readily appreciable gallbladder excretion. This appearance favors obstruction of the cystic duct and acute cholecystitis. IMPRESSION: 1. Failure of filling of the gallbladder even after IV morphine administration, most compatible with obstruction of the cystic duct and acute cholecystitis. 2. Persistent blood pool activity in a manner favoring mild to moderate hepatocellular dysfunction. Electronically Signed   By: Gaylyn Rong M.D.   On: 04/01/2023 12:53    Assessment and Plan Ryan Rose is a 62 y.o. male with medical history significant of AAA status post repair and stenting, alcohol abuse, CAD, HTN, PVD, presented with persistent nauseous vomiting chest pain abdominal pain.   upper endoscopy done which showed grade D esophagitis, large hiatal hernia and single nonbleeding angiectasia in the stomach treated with argon plasma.   MRCP 8/20--Severe diffuse fatty infiltration of the liver. No focal hepatic lesions are identified without contrast. 2. Distended gallbladder with layering sludge. No definite gallstones. Gallbladder wall thickening and pericholecystic inflammatory changes could suggest acalculus cholecystitis or cholestasis. Normal caliber and course of the common bile duct. 3. Small bilateral pleural effusions with overlying atelectasis. There is also a cystic appearing fluid collection near the distal esophagus on the right side. This was not present on the prior CT scan and could be loculated pleural fluid. 4. Moderate mesenteric edema and small  volume ascites.  Severe sepsis (HCC) Acalculus cholecystitis Sepsis fluid bolus given.  Lactic acid 2.5.  Antibiotics changed to Maxipime and Flagyl.   MRCP ordered.  Patient with tachycardia, leukocytosis and fever and hypotension.  Will start midodrine. --MRCP as above --HIDA scan for AM --Gen surgery consult--secure chat sent to dr Toula Moos --8/22-- HIDA scan and abnormal. Discussed with general surgery. Patient is not a surgical candidate. Recommends gallbladder drain placement. I reached out to IR Dr. Fredia Sorrow. In comparison to ultrasound done in July appears this could be chronic. IR recommends repeat right upper quadrant ultrasound. Clinically patient does not have any abdominal pain. Blood pressure stable. White count remains at 13.7. No fever. -- Left message with Ryan Rose of above.   Alcohol withdrawal delirium (HCC) --Received 2 days of high-dose thiamine.   --As needed Librium.   --Right now patient does not have capacity make medical decisions.   --Patient's wife will not have him return home with her.   --addendum: right upper quadrant ultrasound completed. Results pending. Discussed with Dr. Fredia Sorrow IR and Dr. Dolores Frame patient is tachycardic has fever of 101.0. Plan is to place gallbladder drain tomorrow. Will continue broad-spectrum IV antibiotics and IV fluids for now including PRN pain meds. Will keep patient NPO after midnight. -Left another voicemail for patient's wife Ryan Rose.   Acute blood loss anemia Transfused 1 unit of packed red blood cells on 8/16 on a hemoglobin of 6.5.  -- Last hemoglobin 6.8--got 1 unit on 8/21-- continues to drop hemoglobin will have G.I. come see patient again --hgb 9.8   Acute upper GI bleed EGD on 8/15 shows grade D esophagitis, hiatal hernia and angiectasia which was treated with argon plasma coagulation.  Change Protonix back to IV.   Aspiration pneumonia (HCC) Unasyn switched over to Maxipime and Flagyl.    Hypophosphatemia  Hypomagnesemia  Hypokalemia Replace as needed   Alcoholic ketoacidosis Resolved   PVD (peripheral vascular disease) (HCC) Hold Plavix.  Continue  atorvastatin.   Transaminitis Secondary to alcohol abuse.  Alkaline phosphatase 205, total bilirubin of 5.1, AST 117, albumin low at 1.6    Abdominal aortic aneurysm (AAA) (HCC) No endoleak on CTA on admission.   Macrocytic anemia MCV down to 96.8   Hyponatremia Last Na 133   patient has multiple comorbidities. Overall long-term prognosis poor. Family understands. Palliative care input appreciated.  Procedures: Family communication :Ryan Rose on the phone Consults : G.I., ICU CODE STATUS: full DVT Prophylaxis : SCD Level of care: Stepdown Status is: Inpatient Remains inpatient appropriate because: hypertension, acute on chronic G.I. bleed, sepsis    TOTAL TIME TAKING CARE OF THIS PATIENT: 40 minutes.  >50% time spent on counselling and coordination of care  Note: This dictation was prepared with Dragon dictation along with smaller phrase technology. Any transcriptional errors that result from this process are unintentional.  Enedina Finner M.D    [facility]   CC: Primary care physician; Sherron Monday, MD

## 2023-04-02 ENCOUNTER — Inpatient Hospital Stay: Payer: Commercial Managed Care - PPO

## 2023-04-02 ENCOUNTER — Inpatient Hospital Stay: Payer: Commercial Managed Care - PPO | Admitting: Radiology

## 2023-04-02 DIAGNOSIS — K819 Cholecystitis, unspecified: Secondary | ICD-10-CM

## 2023-04-02 DIAGNOSIS — Z515 Encounter for palliative care: Secondary | ICD-10-CM | POA: Diagnosis not present

## 2023-04-02 DIAGNOSIS — K810 Acute cholecystitis: Secondary | ICD-10-CM

## 2023-04-02 DIAGNOSIS — R6520 Severe sepsis without septic shock: Secondary | ICD-10-CM | POA: Diagnosis not present

## 2023-04-02 DIAGNOSIS — F1010 Alcohol abuse, uncomplicated: Secondary | ICD-10-CM

## 2023-04-02 DIAGNOSIS — K922 Gastrointestinal hemorrhage, unspecified: Secondary | ICD-10-CM | POA: Diagnosis not present

## 2023-04-02 DIAGNOSIS — F10930 Alcohol use, unspecified with withdrawal, uncomplicated: Secondary | ICD-10-CM | POA: Diagnosis not present

## 2023-04-02 DIAGNOSIS — A419 Sepsis, unspecified organism: Secondary | ICD-10-CM | POA: Diagnosis not present

## 2023-04-02 HISTORY — DX: Acute cholecystitis: K81.0

## 2023-04-02 HISTORY — PX: IR PERC CHOLECYSTOSTOMY: IMG2326

## 2023-04-02 LAB — CBC
HCT: 31.4 % — ABNORMAL LOW (ref 39.0–52.0)
Hemoglobin: 10.4 g/dL — ABNORMAL LOW (ref 13.0–17.0)
MCH: 31.4 pg (ref 26.0–34.0)
MCHC: 33.1 g/dL (ref 30.0–36.0)
MCV: 94.9 fL (ref 80.0–100.0)
Platelets: 311 10*3/uL (ref 150–400)
RBC: 3.31 MIL/uL — ABNORMAL LOW (ref 4.22–5.81)
RDW: 19.9 % — ABNORMAL HIGH (ref 11.5–15.5)
WBC: 18.1 10*3/uL — ABNORMAL HIGH (ref 4.0–10.5)
nRBC: 0 % (ref 0.0–0.2)

## 2023-04-02 LAB — COMPREHENSIVE METABOLIC PANEL
ALT: 20 U/L (ref 0–44)
AST: 84 U/L — ABNORMAL HIGH (ref 15–41)
Albumin: 1.6 g/dL — ABNORMAL LOW (ref 3.5–5.0)
Alkaline Phosphatase: 218 U/L — ABNORMAL HIGH (ref 38–126)
Anion gap: 7 (ref 5–15)
BUN: 7 mg/dL — ABNORMAL LOW (ref 8–23)
CO2: 20 mmol/L — ABNORMAL LOW (ref 22–32)
Calcium: 7 mg/dL — ABNORMAL LOW (ref 8.9–10.3)
Chloride: 109 mmol/L (ref 98–111)
Creatinine, Ser: 0.37 mg/dL — ABNORMAL LOW (ref 0.61–1.24)
GFR, Estimated: >60 mL/min (ref >60)
Glucose, Bld: 83 mg/dL (ref 70–99)
Potassium: 3.8 mmol/L (ref 3.5–5.1)
Sodium: 136 mmol/L (ref 135–145)
Total Bilirubin: 5.7 mg/dL — ABNORMAL HIGH (ref 0.3–1.2)
Total Protein: 5.6 g/dL — ABNORMAL LOW (ref 6.5–8.1)

## 2023-04-02 LAB — PROTIME-INR
INR: 1.4 — ABNORMAL HIGH (ref 0.8–1.2)
Prothrombin Time: 17.8 s — ABNORMAL HIGH (ref 11.4–15.2)

## 2023-04-02 MED ORDER — SODIUM CHLORIDE 0.9% FLUSH
5.0000 mL | Freq: Three times a day (TID) | INTRAVENOUS | Status: DC
  Administered 2023-04-02 – 2023-04-22 (×42): 5 mL

## 2023-04-02 MED ORDER — MORPHINE SULFATE (PF) 2 MG/ML IV SOLN
0.5000 mg | Freq: Once | INTRAVENOUS | Status: AC
  Administered 2023-04-02: 0.5 mg via INTRAVENOUS
  Filled 2023-04-02: qty 1

## 2023-04-02 MED ORDER — LORAZEPAM 2 MG/ML IJ SOLN
1.0000 mg | Freq: Four times a day (QID) | INTRAMUSCULAR | Status: DC | PRN
  Administered 2023-04-02: 1 mg via INTRAVENOUS
  Filled 2023-04-02: qty 1

## 2023-04-02 MED ORDER — FENTANYL CITRATE (PF) 100 MCG/2ML IJ SOLN
INTRAMUSCULAR | Status: AC
  Filled 2023-04-02: qty 2

## 2023-04-02 MED ORDER — FENTANYL CITRATE (PF) 100 MCG/2ML IJ SOLN
INTRAMUSCULAR | Status: AC | PRN
  Administered 2023-04-02: 50 ug via INTRAVENOUS

## 2023-04-02 MED ORDER — MIDAZOLAM HCL 2 MG/2ML IJ SOLN
INTRAMUSCULAR | Status: AC
  Filled 2023-04-02: qty 4

## 2023-04-02 MED ORDER — MIDAZOLAM HCL 2 MG/2ML IJ SOLN
INTRAMUSCULAR | Status: AC | PRN
  Administered 2023-04-02: 1 mg via INTRAVENOUS

## 2023-04-02 MED ORDER — LIDOCAINE HCL 1 % IJ SOLN
10.0000 mL | Freq: Once | INTRAMUSCULAR | Status: AC
  Administered 2023-04-02: 10 mL via INTRADERMAL

## 2023-04-02 MED ORDER — LIDOCAINE HCL 1 % IJ SOLN
INTRAMUSCULAR | Status: AC
  Filled 2023-04-02: qty 20

## 2023-04-02 MED ORDER — LORAZEPAM 2 MG/ML IJ SOLN: 0.5000 mg | Freq: Four times a day (QID) | INTRAMUSCULAR | Status: DC | PRN

## 2023-04-02 MED ORDER — LORAZEPAM 0.5 MG PO TABS
0.5000 mg | ORAL_TABLET | Freq: Two times a day (BID) | ORAL | Status: DC
  Administered 2023-04-02: 0.5 mg via ORAL
  Filled 2023-04-02: qty 1

## 2023-04-02 MED ORDER — MORPHINE SULFATE (PF) 2 MG/ML IV SOLN
1.0000 mg | Freq: Once | INTRAVENOUS | Status: DC
Start: 2023-04-03 — End: 2023-04-02

## 2023-04-02 MED ORDER — IOHEXOL 300 MG/ML  SOLN
10.0000 mL | Freq: Once | INTRAMUSCULAR | Status: AC | PRN
  Administered 2023-04-02: 10 mL

## 2023-04-02 NOTE — H&P (Signed)
Plan of Care The patient has had a H&P performed within the last 30 days, all history, medications, and exam have been reviewed. No interval changes since the H&P.  Plan for percutaneous cholecystostomy drain today. Patient's wife, Buck Ilgenfritz, was contacted for consent to proceed with percutaneous cholecystostomy drain placement.  Risks and benefits discussed with the patient's wife including, but not limited to bleeding, infection, gallbladder perforation, bile leak, sepsis or even death.  All of the patient's wife's questions were answered, patient is agreeable to proceed. Consent signed and in IR suite.  Kennieth Francois, PA-C 04/02/2023 10:05 AM

## 2023-04-02 NOTE — Progress Notes (Signed)
Pt confused and trying to get OOB. Pt removed his telemetry leads and gown and refuses to follow any redirection given by staff. Pt agitated and stating "leave me alone, Im going home". Pt refused to take PO ATIVAN and continues to try to get OOB. Contacted Dr. Allena Katz to give an update on pt. New orders received. 1mg  IV Ativan given to pt and he is now calm at this time. Mitts placed on pt for safety to prevent pt from removing biliary drain.

## 2023-04-02 NOTE — Progress Notes (Signed)
PT Cancellation Note  Patient Details Name: Ryan Rose MRN: [id number] DOB: [birth year]   Cancelled Treatment:    Reason Eval/Treat Not Completed: Fatigue/lethargy limiting ability to participate Patient lethargic on arrival after recent procedure. Wakes to voice but quickly returns to sleeping. Will re-attempt at later date/time.   Maylon Peppers, PT, DPT Physical Therapist - [facility]  [facility]    [name] [name] 04/02/2023, 2:13 PM

## 2023-04-02 NOTE — Progress Notes (Addendum)
2123 - Pt with increased tachycardia and tachypnea this shift. Provider (NP Jon Billings) notified. Order received for chest xray.   0000 - Provider updated on pt's status. Pt remains tachypneic and tachycardic. Increased oxygen to 4L [place]. Pt c/o generalized pain. Per NP Jon Billings, administer 2mg  Morphine per orders. Also received an order for D-Dimer.   61 - Provider notified of improvement in respiratory rate and slight improvement in HR. Advised d/t soft BP, 1mg  Morphine ordered. Provider acknowledged with no new orders at this time. Awaiting D-Dimer results.    0101 - D-Dimer resulted - provider notified. Order for CTA received.   0130 - Traveled to CT on monitors without issues; pt A&O x1, no changes to VS. Awaiting results.

## 2023-04-02 NOTE — Progress Notes (Signed)
[facility]  - [place] at [facility]   PATIENT NAME: Ryan Rose    MR#:  [id number]  DATE OF BIRTH:  10-12-1960  SUBJECTIVE:  patient seen earlier. Resting quietly. Spoke with patient's spouse [name] on the phone and updated  no fever today. Heart rate improved. Patient is status post gallbladder drain placement. VITALS:  Blood pressure 91/68, pulse (!) 115, temperature 97.9 F (36.6 C), temperature source Oral, resp. rate (!) 24, height 5\' 9"  (1.753 m), weight 65 kg, SpO2 99%.  PHYSICAL EXAMINATION:   GENERAL:  62 y.o.-year-old patient with no acute distress. Chronically ill LUNGS: Normal breath sounds bilaterally, no wheezing CARDIOVASCULAR: S1, S2 normal. + tachycardia ABDOMEN: Soft, nontender, nondistended. No guarding rigidity GB drain+ EXTREMITIES: No  edema b/l.    NEUROLOGIC: nonfocal  patient is  pleasantly confused   LABORATORY PANEL:  CBC Recent Labs  Lab 04/02/23 0224  WBC 18.1*  HGB 10.4*  HCT 31.4*  PLT 311    Chemistries  Recent Labs  Lab 03/31/23 0453 04/01/23 0810 04/02/23 0224  NA 132*   < > 136  K 3.6   < > 3.8  CL 107   < > 109  CO2 20*   < > 20*  GLUCOSE 81   < > 83  BUN 7*   < > 7*  CREATININE 0.57*   < > 0.37*  CALCIUM 6.6*   < > 7.0*  MG 1.8  --   --   AST 83*   < > 84*  ALT 20   < > 20  ALKPHOS 167*   < > 218*  BILITOT 4.7*   < > 5.7*   < > = values in this interval not displayed.   Cardiac Enzymes No results for input(s): "TROPONINI" in the last 168 hours. RADIOLOGY:  IR Perc Cholecystostomy  Result Date: 04/02/2023 INDICATION: Acute cholecystitis EXAM: Placement of percutaneous cholecystostomy drainage catheter using ultrasound and fluoroscopic guidance MEDICATIONS: Documented in the EMR ANESTHESIA/SEDATION: Moderate (conscious) sedation was employed during this procedure. A total of Versed 1 mg and Fentanyl 50 mcg was administered intravenously by the radiology nurse. Total intra-service moderate Sedation  Time: 10 minutes. The patient's level of consciousness and vital signs were monitored continuously by radiology nursing throughout the procedure under my direct supervision. FLUOROSCOPY: Radiation Exposure Index (as provided by the fluoroscopic device): 1.2 minutes (11 mGy) COMPLICATIONS: None immediate. PROCEDURE: Informed written consent was obtained from the patient after a thorough discussion of the procedural risks, benefits and alternatives. All questions were addressed. Maximal Sterile Barrier Technique was utilized including caps, mask, sterile gowns, sterile gloves, sterile drape, hand hygiene and skin antiseptic. A timeout was performed prior to the initiation of the procedure. The patient was placed supine on the exam table. The right upper quadrant was prepped and draped in the standard sterile fashion. Ultrasound of the right upper quadrant was performed for planning purposes. This again demonstrated a distended gallbladder with wall edema and sludge, consistent with acute cholecystitis. An infracostal transhepatic approach was planned. Skin entry site was marked, and local analgesia was obtained with 1% lidocaine. Under ultrasound guidance, percutaneous access was obtained into the gallbladder via an infracostal transhepatic approach using a 21 gauge Chiba needle. Access was confirmed with visualization of needle tip within the gallbladder lumen, and free return of bile. An 018 Nitrex wire was then advanced through the access needle and coiled within the gallbladder lumen. A transition dilator was advanced over this wire, through which  an antegrade cholecystogram was performed. Antegrade cholecystogram demonstrated appropriate location in the gallbladder lumen. Over an Amplatz wire, the percutaneous tract was serially dilated followed by placement of a 10 French locking multipurpose drainage catheter into the gallbladder lumen. Locking loop was formed. Additional biliary sludge was drained. Gentle hand  injection of contrast material confirmed location of the gallbladder lumen. The drainage catheter was secured to the skin using silk suture and a dressing. It was placed to bag drainage. The patient tolerated the procedure well without immediate complication. IMPRESSION: Successful placement of a 10 French percutaneous cholecystostomy tube. Cholecystostomy tube placed to bag drainage. Electronically Signed   By: Olive Bass M.D.   On: 04/02/2023 13:06   US Abdomen Limited RUQ (LIVER/GB)  Result Date: 04/01/2023 CLINICAL DATA:  MRI demonstrating possible acalculous cholecystitis and nuclear medicine imaging demonstrating non filling of the gallbladder. EXAM: ULTRASOUND ABDOMEN LIMITED RIGHT UPPER QUADRANT COMPARISON:  MRI on 03/30/2023 and nuclear medicine hepatic biliary imaging earlier today. FINDINGS: Gallbladder: There is some dependent sludge in the gallbladder lumen without evidence of shadowing calculi. Gallbladder wall edema and a small amount of pericholecystic fluid is present. Gallbladder wall thickness is variable due to asymmetric edema with portions of the wall measuring up to approximately 7 mm. A sonographic Murphy's sign was not elicited. Common bile duct: Diameter: 5-8 mm. Liver: The liver is very echogenic, likely reflecting diffuse parenchymal steatosis. No overt cirrhotic contour morphology. No gross hepatic lesions or intrahepatic biliary ductal dilatation. Portal vein is patent on color Doppler imaging with normal direction of blood flow towards the liver. Other: Trace free fluid adjacent to the liver. IMPRESSION: 1. Gallbladder sludge with gallbladder wall edema and small amount of pericholecystic fluid. No evidence of shadowing calculi. Findings are suggestive of acalculous cholecystitis. 2. Diffuse hepatic steatosis. 3. Trace free fluid adjacent to the liver. Electronically Signed   By: Irish Lack M.D.   On: 04/01/2023 15:56   NM Hepatobiliary Liver Func  Result Date:  04/01/2023 CLINICAL DATA:  Cholecystitis EXAM: NUCLEAR MEDICINE HEPATOBILIARY IMAGING TECHNIQUE: Sequential images of the abdomen were obtained out to 60 minutes following intravenous administration of radiopharmaceutical. RADIOPHARMACEUTICALS:  7.7 mCi Tc-39m  Choletec IV 2.5 mg morphine IV COMPARISON:  MRCP 03/30/2023 FINDINGS: Moderate delay in uptake of radiopharmaceutical from the blood pool with blood pool still visible at 30 minutes. Biliary and bowel activity at 15 minutes indicating patency of the common hepatic duct and common bile duct. At 50 minutes, there is no gallbladder excretion. Subsequently, 2.5 mg of IV morphine was administered to the patient, with subsequent imaging demonstrating only biliary and bowel activity without readily appreciable gallbladder excretion. This appearance favors obstruction of the cystic duct and acute cholecystitis. IMPRESSION: 1. Failure of filling of the gallbladder even after IV morphine administration, most compatible with obstruction of the cystic duct and acute cholecystitis. 2. Persistent blood pool activity in a manner favoring mild to moderate hepatocellular dysfunction. Electronically Signed   By: Gaylyn Rong M.D.   On: 04/01/2023 12:53    Assessment and Plan SYLVANUS HULLUM is a 62 y.o. male with medical history significant of AAA status post repair and stenting, alcohol abuse, CAD, HTN, PVD, presented with persistent nauseous vomiting chest pain abdominal pain.   upper endoscopy done which showed grade D esophagitis, large hiatal hernia and single nonbleeding angiectasia in the stomach treated with argon plasma.   MRCP 8/20--Severe diffuse fatty infiltration of the liver. No focal hepatic lesions are identified without contrast. 2. Distended  gallbladder with layering sludge. No definite gallstones. Gallbladder wall thickening and pericholecystic inflammatory changes could suggest acalculus cholecystitis or cholestasis. Normal caliber and  course of the common bile duct. 3. Small bilateral pleural effusions with overlying atelectasis. There is also a cystic appearing fluid collection near the distal esophagus on the right side. This was not present on the prior CT scan and could be loculated pleural fluid. 4. Moderate mesenteric edema and small volume ascites.  Severe sepsis (HCC) Acalculus cholecystitis --  Lactic acid 2.5.   --Cont IV Maxipime and Flagyl.  Patient with tachycardia, leukocytosis and fever and hypotension.   --MRCP as above --Gen surgery consult--secure chat sent to dr Toula Moos --8/22-- HIDA scan and abnormal. Discussed with general surgery. Patient is not a surgical candidate. Recommends gallbladder drain placement. I reached out to IR Dr. Fredia Sorrow. In comparison to ultrasound done in July appears this could be chronic. IR recommends repeat right upper quadrant ultrasound. Clinically patient does not have any abdominal pain. Blood pressure stable. White count remains at 13.7. No fever. -- Left message with [name] of above. ----addendum: right upper quadrant ultrasound completed. Discussed with Dr. Fredia Sorrow IR and Dr. Dolores Frame patient is tachycardic has fever of 101.0. Plan is to place gallbladder drain tomorrow. Will continue broad-spectrum IV antibiotics and IV fluids for now including PRN pain meds. Will keep patient NPO after midnight. -Left another voicemail for patient's wife [name]. --8/23--s/p GB drain placement. Start PO diet. Continue antibiotics, IV fluids, PT OT to see patient.   Alcohol withdrawal delirium (HCC) --Received 2 days of high-dose thiamine.   --As needed Librium.   --Right now patient does not have capacity make medical decisions.  Reach out to Spouse tammy   Acute blood loss anemia Transfused 1 unit of packed red blood cells on 8/16 on a hemoglobin of 6.5.  -- Last hemoglobin 6.8--got 1 unit on 8/21-- continues to drop hemoglobin will have G.I. come see patient again --hgb 10.4    Acute upper GI bleed EGD on 8/15 shows grade D esophagitis, hiatal hernia and angiectasia which was treated with argon plasma coagulation.  --cont PPI IV bid   Aspiration pneumonia (HCC) --on IV Maxipime and Flagyl.    Hypophosphatemia  Hypomagnesemia  Hypokalemia Replace as needed   Alcoholic ketoacidosis Resolved   PVD (peripheral vascular disease) (HCC) Hold Plavix.  Continue atorvastatin.   Transaminitis Secondary to alcohol abuse.  Alkaline phosphatase 205, total bilirubin of 5.1, AST 117, albumin low at 1.6    Abdominal aortic aneurysm (AAA) (HCC) No endoleak on CTA on admission.   Macrocytic anemia MCV down to 96.8   Hyponatremia Last Na 133   patient has multiple comorbidities. Overall long-term prognosis poor. Family understands. Palliative care input appreciated.  Procedures:GB drain placement by IR on 8/23 Family communication :[name] on the phone Consults : G.I., ICU CODE STATUS: DNR/DNI DVT Prophylaxis : SCD Level of care: Stepdown Status is: Inpatient Remains inpatient appropriate because: hypertension, acute on chronic G.I. bleed, sepsis    TOTAL TIME TAKING CARE OF THIS PATIENT: 40 minutes.  >50% time spent on counselling and coordination of care  Note: This dictation was prepared with Dragon dictation along with smaller phrase technology. Any transcriptional errors that result from this process are unintentional.  Enedina Finner M.D    [facility]   CC: Primary care physician; Sherron Monday, MD

## 2023-04-02 NOTE — Procedures (Signed)
Interventional Radiology Procedure Note  Date of Procedure: 04/02/2023  Procedure: Cholecystotomy tube placement   Findings:  1. Placement of 10 Fr perc chole tube, placed to bag drain    Complications: No immediate complications noted.   Estimated Blood Loss: minimal  Follow-up and Recommendations: 1. Flush x3 daily, record output  2. Follow up per surgery    Olive Bass, MD  Vascular & Interventional Radiology  04/02/2023 1:03 PM

## 2023-04-02 NOTE — Progress Notes (Signed)
                                                     Palliative Care Progress Note, Assessment & Plan   Patient Name: Ryan Rose       Date: 04/02/2023 DOB: [birth year]  Age: 62 y.o. MRN#: [id number] Attending Physician: Enedina Finner, MD Primary Care Physician: Sherron Monday, MD Admit Date: 03/21/2023  Subjective: Patient is lying in bed in no acute distress.  He acknowledges my presence and is able to make his wishes known.  No family or friends present during my visit.  HPI: 62 y.o. male  with past medical history of AAA status post repair and stenting, alcohol abuse, CAD, HTN, PVD admitted on 03/21/2023 with N/V/CP/abd pain.   Patient is being treated for severe sepsis with acalculous cholecystitis, alcohol withdrawal delirium, blood loss anemia, and aspiration pneumonia.   PMT was consulted to discuss goals of care.  Summary of counseling/coordination of care: After reviewing the patient's chart and assessing the patient at bedside, I spoke with patient in regards to symptom management and plan of care.  Symptoms assessed.  Patient denies pain or discomfort at this time.  He endorses being hungry.  Discussed that patient is on the schedule for drain placement today and that he needs to remain NPO.  Patient says "do not worry about me I will figure this out".  I questions patient's ability to understand complex medical status at this time.  He was able to answer orientation questions appropriately but began to [facility] about "taking care of myself".  DNR with limited interventions remain.  Awaiting drain placement.  PMT will continue to follow and support patient throughout his hospitalization.  Of note, I am off service until Monday.  Please utilize Amion for providers for any acute palliative needs over the weekend and I plan to see patient in  follow-up with his spouse on Monday.  Physical Exam Vitals reviewed.  Constitutional:      General: He is not in acute distress.    Appearance: He is normal weight.  HENT:     Head: Normocephalic.  Eyes:     Pupils: Pupils are equal, round, and reactive to light.  Cardiovascular:     Rate and Rhythm: Normal rate.     Heart sounds: Normal heart sounds.  Abdominal:     Palpations: Abdomen is soft.  Musculoskeletal:     Comments: Generalized weakness  Skin:    General: Skin is warm and dry.  Neurological:     Mental Status: He is alert.  Psychiatric:        Mood and Affect: Mood is not anxious.             Total Time 25 minutes   Shaneya Taketa L. Bonita Quin, DNP, FNP-BC Palliative Medicine Team
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
## 2023-04-02 NOTE — Plan of Care (Signed)
  Problem: Education: Goal: Knowledge of General Education information will improve Description: Including pain rating scale, medication(s)/side effects and non-pharmacologic comfort measures Outcome: Not Progressing   Problem: Health Behavior/Discharge Planning: Goal: Ability to manage health-related needs will improve Outcome: Not Progressing   Problem: Clinical Measurements: Goal: Ability to maintain clinical measurements within normal limits will improve Outcome: Not Progressing Goal: Will remain free from infection Outcome: Not Progressing Goal: Diagnostic test results will improve Outcome: Not Progressing Goal: Respiratory complications will improve Outcome: Not Progressing Goal: Cardiovascular complication will be avoided Outcome: Not Progressing   Problem: Activity: Goal: Risk for activity intolerance will decrease Outcome: Not Progressing   Problem: Nutrition: Goal: Adequate nutrition will be maintained Outcome: Not Progressing   Problem: Coping: Goal: Level of anxiety will decrease Outcome: Not Progressing   Problem: Elimination: Goal: Will not experience complications related to bowel motility Outcome: Not Progressing Goal: Will not experience complications related to urinary retention Outcome: Not Progressing   Problem: Pain Managment: Goal: General experience of comfort will improve Outcome: Not Progressing   Problem: Safety: Goal: Ability to remain free from injury will improve Outcome: Not Progressing   Problem: Skin Integrity: Goal: Risk for impaired skin integrity will decrease Outcome: Not Progressing   Problem: Fluid Volume: Goal: Hemodynamic stability will improve Outcome: Not Progressing   Problem: Clinical Measurements: Goal: Diagnostic test results will improve Outcome: Not Progressing Goal: Signs and symptoms of infection will decrease Outcome: Not Progressing   Problem: Respiratory: Goal: Ability to maintain adequate ventilation  will improve Outcome: Not Progressing   

## 2023-04-02 NOTE — Progress Notes (Signed)
       CROSS COVER NOTE  NAME: Ryan Rose MRN: [id number] DOB : [birth year]    Concern as stated by nurse / staff   Patient with more elevated heart rate 130. Afebrile. Sats stable on 2 L    Pertinent findings on chart review:   Assessment and  Interventions   Assessment: Patient tachypnic 41. States he hurts all over; moderate distress    04/02/2023    9:00 PM 04/02/2023    7:00 PM 04/02/2023    6:00 PM  Vitals with BMI  Systolic 84 88 95  Diastolic 61 59 66   Chest xray - minimal right pleural effusion. Low lung volumes Ddimer 3.60 CTA chest  FINDINGS: Cardiovascular: No filling defects in the pulmonary arteries to suggest pulmonary emboli. Heart is normal size. Aorta is normal caliber. Extensive coronary artery and moderate aortic calcifications.   Mediastinum/Nodes: No mediastinal, hilar, or axillary adenopathy. Trachea and esophagus are unremarkable. Thyroid unremarkable.   Lungs/Pleura: Moderate centrilobular emphysema. Moderate right pleural effusion and small left pleural effusion. Compressive atelectasis in the lower lobes. Right middle lobe atelectasis.   Upper Abdomen: No acute findings. Diffuse low-density throughout the liver compatible with fatty infiltration.   Musculoskeletal: Chest wall soft tissues are unremarkable. No acute bony abnormality.   Review of the MIP images confirms the above findings.   IMPRESSION: No evidence of pulmonary embolus.   Extensive coronary artery disease.   Moderate right pleural effusion and small left pleural effusion. Bibasilar compressive atelectasis.   Aortic Atherosclerosis (ICD10-I70.0) and Emphysema (ICD10-J43.9).   Hepatic steatosis.     Plan: Give previously ordered morphine for discomfort CTA negative for PE [place] BLE pending Pulmicort BID and prednisone 60 mg daily for 5 days for COPD exacerbation     Donnie Mesa NP [facility] Cross Cover 7pm-7am - check amion for  availability Pager [phone]

## 2023-04-02 NOTE — Progress Notes (Signed)
[place] okay to travel [place] without RN per Dr. Allena Katz.

## 2023-04-03 ENCOUNTER — Inpatient Hospital Stay: Payer: Commercial Managed Care - PPO

## 2023-04-03 DIAGNOSIS — R6520 Severe sepsis without septic shock: Secondary | ICD-10-CM | POA: Diagnosis not present

## 2023-04-03 DIAGNOSIS — A419 Sepsis, unspecified organism: Secondary | ICD-10-CM | POA: Diagnosis not present

## 2023-04-03 LAB — D-DIMER, QUANTITATIVE: D-Dimer, Quant: 3.6 ug{FEU}/mL — ABNORMAL HIGH (ref 0.00–0.50)

## 2023-04-03 MED ORDER — PREDNISONE 20 MG PO TABS
60.0000 mg | ORAL_TABLET | Freq: Every day | ORAL | Status: DC
  Administered 2023-04-03 – 2023-04-04 (×2): 60 mg via ORAL
  Filled 2023-04-03 (×2): qty 3

## 2023-04-03 MED ORDER — IPRATROPIUM-ALBUTEROL 0.5-2.5 (3) MG/3ML IN SOLN
3.0000 mL | RESPIRATORY_TRACT | Status: DC | PRN
  Filled 2023-04-03: qty 3

## 2023-04-03 MED ORDER — BUDESONIDE 0.25 MG/2ML IN SUSP
0.2500 mg | Freq: Two times a day (BID) | RESPIRATORY_TRACT | Status: DC
  Administered 2023-04-03 – 2023-04-10 (×11): 0.25 mg via RESPIRATORY_TRACT
  Filled 2023-04-03 (×13): qty 2

## 2023-04-03 MED ORDER — IOHEXOL 350 MG/ML SOLN
100.0000 mL | Freq: Once | INTRAVENOUS | Status: AC | PRN
  Administered 2023-04-03: 100 mL via INTRAVENOUS

## 2023-04-03 NOTE — Plan of Care (Signed)

## 2023-04-03 NOTE — Progress Notes (Addendum)
Progress Note    Ryan Rose  UEA:[id number] DOB: Dec 04, 1960  DOA: 03/21/2023 PCP: Sherron Monday, MD      Brief Narrative:    Medical records reviewed and are as summarized below:  Ryan Rose is a 62 y.o. male with medical history significant for AAA s/p repair and stenting, alcohol use disorder, CAD, hypertension, PVD, who presented to the hospital with nausea, vomiting, chest pain and abdominal pain. He developed fever, hypotension and tachycardia in the hospital.  He was found to have severe sepsis.   upper endoscopy done which showed grade D esophagitis, large hiatal hernia and single nonbleeding angiectasia in the stomach treated with argon plasma.    MRCP 8/20--Severe diffuse fatty infiltration of the liver. No focal hepatic lesions are identified without contrast. 2. Distended gallbladder with layering sludge. No definite gallstones. Gallbladder wall thickening and pericholecystic inflammatory changes could suggest acalculus cholecystitis or cholestasis. Normal caliber and course of the common bile duct. 3. Small bilateral pleural effusions with overlying atelectasis. There is also a cystic appearing fluid collection near the distal esophagus on the right side. This was not present on the prior CT scan and could be loculated pleural fluid. 4. Moderate mesenteric edema and small volume ascites.       Assessment/Plan:   Principal Problem:   Severe sepsis (HCC) Active Problems:   Acute blood loss anemia   Alcohol withdrawal delirium (HCC)   Acute upper GI bleed   Aspiration pneumonia (HCC)   Alcohol withdrawal (HCC)   Nausea & vomiting   Hypophosphatemia   Hypomagnesemia   Hypokalemia   Alcoholic ketoacidosis   PVD (peripheral vascular disease) (HCC)   Transaminitis   Macrocytic anemia   Abdominal aortic aneurysm (AAA) (HCC)   Leukocytosis   Alcohol abuse   Bilirubinemia   Hyponatremia   Arterial hypotension   Acute acalculous  cholecystitis    Body mass index is 21.16 kg/m.    Severe sepsis secondary to acalculous cholecystitis: S/p cholecystostomy tube placement on 04/02/2023.  Continue Parikh IV cefepime and Flagyl.  Follow-up with general surgeon.   Aspiration pneumonia: He is on empiric IV antibiotics Moderate right pleural effusion and small left pleural effusion, right basilar compressive atelectasis, incentive spirometer as needed.   Acute hypoxic respiratory failure: Oxygen therapy has been weaned down from 4 L to 2 L/min.  Wean off oxygen as able.   Sinus tachycardia: Improved.  CTA chest did not show any acute pulmonary embolism.   Hypotension: Continue midodrine   Alcohol use disorder with alcohol withdrawal complicated by delirium: Continue benzodiazepines as needed per CIWA protocol. Elevated liver enzymes likely due to alcohol use disorder   Acute upper GI bleeding: S/p EGD on 03/25/2023 which showed grade D esophagitis, hiatal hernia and single nonbleeding angiectasia in the stomach   Acute blood loss anemia: S/p transfusion with 1 unit of PRBCs on 03/31/2023 for hemoglobin of 6.8.  H&H is stable.   Hyponatremia, hypokalemia, hypomagnesemia, hypophosphatemia: Improved.  Monitor electrolytes and replete as needed.   Alcoholic ketoacidosis: Improved   Other comorbidities include history of AAA s/p repair and stenting, PVD, chronic anemia, extensive CAD on CT chest  Diet Order             DIET DYS 3 Room service appropriate? Yes; Fluid consistency: Thin  Diet effective now  Consultants: Gastroenterologist Interventional radiologist General surgeon  Procedures: EGD on 03/25/2023 Cholecystostomy tube placement on 04/02/2023    Medications:    atorvastatin  40 mg Oral Daily   budesonide (PULMICORT) nebulizer solution  0.25 mg Nebulization BID   Chlorhexidine Gluconate Cloth  6 each Topical Daily   folic acid  1 mg Oral Daily    melatonin  2.5 mg Oral QHS   midodrine  10 mg Oral TID WC   multivitamin with minerals  1 tablet Oral Daily   nicotine  14 mg Transdermal Daily   pantoprazole (PROTONIX) IV  40 mg Intravenous Q12H   pneumococcal 20-valent conjugate vaccine  0.5 mL Intramuscular Tomorrow-1000   predniSONE  60 mg Oral Q breakfast   QUEtiapine  12.5 mg Oral QHS   senna-docusate  2 tablet Oral QHS   sodium chloride flush  5 mL Intracatheter Q8H   sucralfate  1 g Oral TID WC & HS   thiamine (VITAMIN B1) injection  100 mg Intravenous Daily   Continuous Infusions:  ceFEPime (MAXIPIME) IV Stopped (04/03/23 1058)   metronidazole Stopped (04/03/23 1024)     Anti-infectives (From admission, onward)    Start     Dose/Rate Route Frequency Ordered Stop   03/30/23 1800  ceFEPIme (MAXIPIME) 2 g in sodium chloride 0.9 % 100 mL IVPB        2 g 200 mL/hr over 30 Minutes Intravenous Every 8 hours 03/30/23 1235     03/30/23 1000  metroNIDAZOLE (FLAGYL) IVPB 500 mg        500 mg 100 mL/hr over 60 Minutes Intravenous Every 12 hours 03/30/23 0819 04/06/23 0959   03/30/23 0900  ceFEPIme (MAXIPIME) 2 g in sodium chloride 0.9 % 100 mL IVPB        2 g 200 mL/hr over 30 Minutes Intravenous  Once 03/30/23 0819 03/30/23 1042   03/28/23 1600  Ampicillin-Sulbactam (UNASYN) 3 g in sodium chloride 0.9 % 100 mL IVPB  Status:  Discontinued        3 g 200 mL/hr over 30 Minutes Intravenous Every 6 hours 03/28/23 1442 03/30/23 0819   03/25/23 2000  cefTRIAXone (ROCEPHIN) 1 g in sodium chloride 0.9 % 100 mL IVPB  Status:  Discontinued        1 g 200 mL/hr over 30 Minutes Intravenous Every 24 hours 03/25/23 1837 03/27/23 0719              Family Communication/Anticipated D/C date and plan/Code Status   DVT prophylaxis: Place and maintain sequential compression device Start: 03/21/23 1236     Code Status: DNR  Family Communication: None Disposition Plan: Plan to discharge to SNF   Status is: Inpatient Remains  inpatient appropriate because: Confusion, hypoxia       Subjective:   Interval events noted.  He is confused and cannot provide any history.  Objective:    Vitals:   04/03/23 1145 04/03/23 1200 04/03/23 1300 04/03/23 1400  BP:  91/70 106/84 (!) 89/67  Pulse:  92 95 90  Resp:  14 (!) 22 14  Temp: (!) 97.1 F (36.2 C)     TempSrc: Axillary     SpO2:  93% 96% 97%  Weight:      Height:       No data found.   Intake/Output Summary (Last 24 hours) at 04/03/2023 1453 Last data filed at 04/03/2023 1059 Gross per 24 hour  Intake 1016.56 ml  Output 370 ml  Net 646.56 ml   Ceasar Mons  Weights   03/25/23 0959 03/29/23 0623 03/30/23 1855  Weight: 63.2 kg 64.9 kg 65 kg    Exam:  GEN: NAD SKIN: Warm and dry EYES: No pallor or icterus ENT: MMM CV: RRR PULM: CTA B ABD: soft, ND, NT, +BS CNS: AAO x 1 (person) , non focal EXT: No edema or tenderness        Data Reviewed:   I have personally reviewed following labs and imaging studies:  Labs: Labs show the following:   Basic Metabolic Panel: Recent Labs  Lab 03/30/23 0522 03/30/23 0842 03/30/23 1910 03/31/23 0453 04/01/23 0810 04/02/23 0224  NA 137 133* 133* 132* 136 136  K 3.3* 2.8* 3.0* 3.6 2.9* 3.8  CL 105 105 101 107 109 109  CO2 24 19* 22 20* 20* 20*  GLUCOSE 81 72 90 81 86 83  BUN 5* 5* 5* 7* 6* 7*  CREATININE 0.43* 0.35* 0.42* 0.57* 0.42* 0.37*  CALCIUM 7.3* 6.9* 6.9* 6.6* 7.2* 7.0*  MG 2.2  --   --  1.8  --   --   PHOS 2.9  --   --  2.6  --   --    GFR Estimated Creatinine Clearance: 88 mL/min (A) (by C-G formula based on SCr of 0.37 mg/dL (L)). Liver Function Tests: Recent Labs  Lab 03/30/23 0522 03/30/23 0842 03/31/23 0453 04/01/23 0810 04/02/23 0224  AST 126* 117* 83* 92* 84*  ALT 29 28 20 23 20   ALKPHOS 224* 205* 167* 194* 218*  BILITOT 5.5* 5.1* 4.7* 5.2* 5.7*  PROT 5.9* 5.5* 4.9* 5.2* 5.6*  ALBUMIN 1.6* 1.6* 1.7* 1.7* 1.6*   Recent Labs  Lab 03/30/23 0522  LIPASE 49   No  results for input(s): "AMMONIA" in the last 168 hours. Coagulation profile Recent Labs  Lab 03/30/23 0522 03/31/23 0453 04/02/23 0224  INR 1.3* 1.5* 1.4*    CBC: Recent Labs  Lab 03/30/23 0522 03/30/23 1910 03/31/23 0453 04/01/23 0810 04/01/23 2048 04/02/23 0224  WBC 14.0* 13.7* 12.6* 13.7*  --  18.1*  HGB 9.1* 7.8* 6.8* 9.8* 9.5* 10.4*  HCT 27.1* 23.0* 20.1* 28.0* 27.5* 31.4*  MCV 96.8 97.0 95.3 92.4  --  94.9  PLT 380 209 306 326  --  311   Cardiac Enzymes: Recent Labs  Lab 03/29/23 1750  CKTOTAL 69   BNP (last 3 results) No results for input(s): "PROBNP" in the last 8760 hours. CBG: Recent Labs  Lab 03/30/23 1904  GLUCAP 87   D-Dimer: Recent Labs    04/03/23 0017  DDIMER 3.60*   Hgb A1c: No results for input(s): "HGBA1C" in the last 72 hours. Lipid Profile: No results for input(s): "CHOL", "HDL", "LDLCALC", "TRIG", "CHOLHDL", "LDLDIRECT" in the last 72 hours. Thyroid function studies: No results for input(s): "TSH", "T4TOTAL", "T3FREE", "THYROIDAB" in the last 72 hours.  Invalid input(s): "FREET3" Anemia work up: No results for input(s): "VITAMINB12", "FOLATE", "FERRITIN", "TIBC", "IRON", "RETICCTPCT" in the last 72 hours. Sepsis Labs: Recent Labs  Lab 03/30/23 0830 03/30/23 1118 03/30/23 1453 03/30/23 1910 03/31/23 0453 04/01/23 0810 04/02/23 0224  PROCALCITON  --   --   --  1.64  --   --   --   WBC  --   --   --  13.7* 12.6* 13.7* 18.1*  LATICACIDVEN 1.4 2.5* 1.2 1.5  --   --   --     Microbiology Recent Results (from the past 240 hour(s))  Culture, blood (Routine X 2) w Reflex to ID Panel  Status: None (Preliminary result)   Collection Time: 03/30/23  8:29 AM   Specimen: BLOOD LEFT HAND  Result Value Ref Range Status   Specimen Description BLOOD LEFT HAND  Final   Special Requests   Final    BOTTLES DRAWN AEROBIC AND ANAEROBIC Blood Culture adequate volume   Culture   Final    NO GROWTH 4 DAYS Performed at [facility], [address], [place], [place] [address]    Report Status PENDING  Incomplete  Culture, blood (Routine X 2) w Reflex to ID Panel     Status: None (Preliminary result)   Collection Time: 03/30/23 11:18 AM   Specimen: BLOOD LEFT ARM  Result Value Ref Range Status   Specimen Description BLOOD LEFT ARM  Final   Special Requests   Final    BOTTLES DRAWN AEROBIC AND ANAEROBIC Blood Culture adequate volume   Culture   Final    NO GROWTH 4 DAYS Performed at [facility], [address], [place], [place] [address]    Report Status PENDING  Incomplete  MRSA Next Gen by PCR, Nasal     Status: None   Collection Time: 03/30/23  7:01 PM   Specimen: Nasal Mucosa; Nasal Swab  Result Value Ref Range Status   MRSA by PCR Next Gen NOT DETECTED NOT DETECTED Final    Comment: (NOTE) The GeneXpert MRSA Assay (FDA approved for NASAL specimens only), is one component of a comprehensive MRSA colonization surveillance program. It is not intended to diagnose MRSA infection nor to guide or monitor treatment for MRSA infections. Test performance is not FDA approved in patients less than 53 years old. Performed at [facility], [address] [place], [place] [address]     Procedures and diagnostic studies:  US Venous Img Lower Bilateral (DVT)  Result Date: 04/03/2023 CLINICAL DATA:  Elevated D-dimer EXAM: BILATERAL LOWER EXTREMITY VENOUS DOPPLER ULTRASOUND TECHNIQUE: Gray-scale sonography with graded compression, as well as color Doppler and duplex ultrasound were performed to evaluate the lower extremity deep venous systems from the level of the common femoral vein and including the common femoral, femoral, profunda femoral, popliteal and calf veins including the posterior tibial, peroneal and gastrocnemius veins when visible. The superficial great saphenous vein was also interrogated. Spectral Doppler was utilized to evaluate flow at rest and with distal augmentation maneuvers in  the common femoral, femoral and popliteal veins. COMPARISON:  None Available. FINDINGS: RIGHT LOWER EXTREMITY Common Femoral Vein: No evidence of thrombus. Normal compressibility, respiratory phasicity and response to augmentation. Saphenofemoral Junction: No evidence of thrombus. Normal compressibility and flow on color Doppler imaging. Profunda Femoral Vein: No evidence of thrombus. Normal compressibility and flow on color Doppler imaging. Femoral Vein: No evidence of thrombus. Normal compressibility, respiratory phasicity and response to augmentation. Popliteal Vein: No evidence of thrombus. Normal compressibility, respiratory phasicity and response to augmentation. Calf Veins: No evidence of thrombus. Normal compressibility and flow on color Doppler imaging. Superficial Great Saphenous Vein: No evidence of thrombus. Normal compressibility. Venous Reflux:  None. Other Findings:  None. LEFT LOWER EXTREMITY Common Femoral Vein: No evidence of thrombus. Normal compressibility, respiratory phasicity and response to augmentation. Saphenofemoral Junction: No evidence of thrombus. Normal compressibility and flow on color Doppler imaging. Profunda Femoral Vein: No evidence of thrombus. Normal compressibility and flow on color Doppler imaging. Femoral Vein: No evidence of thrombus. Normal compressibility, respiratory phasicity and response to augmentation. Popliteal Vein: No evidence of thrombus. Normal compressibility, respiratory phasicity and response to  augmentation. Calf Veins: No evidence of thrombus. Normal compressibility and flow on color Doppler imaging. Superficial Great Saphenous Vein: No evidence of thrombus. Normal compressibility. Venous Reflux:  None. Other Findings:  None. IMPRESSION: No evidence of deep venous thrombosis in either lower extremity. Electronically Signed   By: Malachy Moan M.D.   On: 04/03/2023 08:00   CT Angio Chest Pulmonary Embolism (PE) W or WO Contrast  Result Date:  04/03/2023 CLINICAL DATA:  Pulmonary embolism (PE) suspected, high prob EXAM: CT ANGIOGRAPHY CHEST WITH CONTRAST TECHNIQUE: Multidetector CT imaging of the chest was performed using the standard protocol during bolus administration of intravenous contrast. Multiplanar CT image reconstructions and MIPs were obtained to evaluate the vascular anatomy. RADIATION DOSE REDUCTION: This exam was performed according to the departmental dose-optimization program which includes automated exposure control, adjustment of the mA and/or kV according to patient size and/or use of iterative reconstruction technique. CONTRAST:  OMNIPAQUE IOHEXOL 350 MG/ML SOLN COMPARISON:  03/21/2023 FINDINGS: Cardiovascular: No filling defects in the pulmonary arteries to suggest pulmonary emboli. Heart is normal size. Aorta is normal caliber. Extensive coronary artery and moderate aortic calcifications. Mediastinum/Nodes: No mediastinal, hilar, or axillary adenopathy. Trachea and esophagus are unremarkable. Thyroid unremarkable. Lungs/Pleura: Moderate centrilobular emphysema. Moderate right pleural effusion and small left pleural effusion. Compressive atelectasis in the lower lobes. Right middle lobe atelectasis. Upper Abdomen: No acute findings. Diffuse low-density throughout the liver compatible with fatty infiltration. Musculoskeletal: Chest wall soft tissues are unremarkable. No acute bony abnormality. Review of the MIP images confirms the above findings. IMPRESSION: No evidence of pulmonary embolus. Extensive coronary artery disease. Moderate right pleural effusion and small left pleural effusion. Bibasilar compressive atelectasis. Aortic Atherosclerosis (ICD10-I70.0) and Emphysema (ICD10-J43.9). Hepatic steatosis. Electronically Signed   By: Charlett Nose M.D.   On: 04/03/2023 01:32   DG Chest Port 1 View  Result Date: 04/02/2023 CLINICAL DATA:  Shortness of breath. EXAM: PORTABLE CHEST 1 VIEW COMPARISON:  Radiograph 03/28/2023, CT  03/21/2023 FINDINGS: Chronic elevation of right hemidiaphragm. Overall lower lung volumes from prior exam. Minimal right pleural effusion. Stable heart size and mediastinal contours. No pulmonary edema or pneumothorax. IMPRESSION: Minimal right pleural effusion. Overall lower lung volumes from prior exam. Electronically Signed   By: Narda Rutherford M.D.   On: 04/02/2023 21:50   IR Perc Cholecystostomy  Result Date: 04/02/2023 INDICATION: Acute cholecystitis EXAM: Placement of percutaneous cholecystostomy drainage catheter using ultrasound and fluoroscopic guidance MEDICATIONS: Documented in the EMR ANESTHESIA/SEDATION: Moderate (conscious) sedation was employed during this procedure. A total of Versed 1 mg and Fentanyl 50 mcg was administered intravenously by the radiology nurse. Total intra-service moderate Sedation Time: 10 minutes. The patient's level of consciousness and vital signs were monitored continuously by radiology nursing throughout the procedure under my direct supervision. FLUOROSCOPY: Radiation Exposure Index (as provided by the fluoroscopic device): 1.2 minutes (11 mGy) COMPLICATIONS: None immediate. PROCEDURE: Informed written consent was obtained from the patient after a thorough discussion of the procedural risks, benefits and alternatives. All questions were addressed. Maximal Sterile Barrier Technique was utilized including caps, mask, sterile gowns, sterile gloves, sterile drape, hand hygiene and skin antiseptic. A timeout was performed prior to the initiation of the procedure. The patient was placed supine on the exam table. The right upper quadrant was prepped and draped in the standard sterile fashion. Ultrasound of the right upper quadrant was performed for planning purposes. This again demonstrated a distended gallbladder with wall edema and sludge, consistent with acute cholecystitis. An infracostal transhepatic approach  was planned. Skin entry site was marked, and local analgesia  was obtained with 1% lidocaine. Under ultrasound guidance, percutaneous access was obtained into the gallbladder via an infracostal transhepatic approach using a 21 gauge Chiba needle. Access was confirmed with visualization of needle tip within the gallbladder lumen, and free return of bile. An 018 Nitrex wire was then advanced through the access needle and coiled within the gallbladder lumen. A transition dilator was advanced over this wire, through which an antegrade cholecystogram was performed. Antegrade cholecystogram demonstrated appropriate location in the gallbladder lumen. Over an Amplatz wire, the percutaneous tract was serially dilated followed by placement of a 10 French locking multipurpose drainage catheter into the gallbladder lumen. Locking loop was formed. Additional biliary sludge was drained. Gentle hand injection of contrast material confirmed location of the gallbladder lumen. The drainage catheter was secured to the skin using silk suture and a dressing. It was placed to bag drainage. The patient tolerated the procedure well without immediate complication. IMPRESSION: Successful placement of a 10 French percutaneous cholecystostomy tube. Cholecystostomy tube placed to bag drainage. Electronically Signed   By: Olive Bass M.D.   On: 04/02/2023 13:06   US Abdomen Limited RUQ (LIVER/GB)  Result Date: 04/01/2023 CLINICAL DATA:  MRI demonstrating possible acalculous cholecystitis and nuclear medicine imaging demonstrating non filling of the gallbladder. EXAM: ULTRASOUND ABDOMEN LIMITED RIGHT UPPER QUADRANT COMPARISON:  MRI on 03/30/2023 and nuclear medicine hepatic biliary imaging earlier today. FINDINGS: Gallbladder: There is some dependent sludge in the gallbladder lumen without evidence of shadowing calculi. Gallbladder wall edema and a small amount of pericholecystic fluid is present. Gallbladder wall thickness is variable due to asymmetric edema with portions of the wall measuring up to  approximately 7 mm. A sonographic Murphy's sign was not elicited. Common bile duct: Diameter: 5-8 mm. Liver: The liver is very echogenic, likely reflecting diffuse parenchymal steatosis. No overt cirrhotic contour morphology. No gross hepatic lesions or intrahepatic biliary ductal dilatation. Portal vein is patent on color Doppler imaging with normal direction of blood flow towards the liver. Other: Trace free fluid adjacent to the liver. IMPRESSION: 1. Gallbladder sludge with gallbladder wall edema and small amount of pericholecystic fluid. No evidence of shadowing calculi. Findings are suggestive of acalculous cholecystitis. 2. Diffuse hepatic steatosis. 3. Trace free fluid adjacent to the liver. Electronically Signed   By: Irish Lack M.D.   On: 04/01/2023 15:56               LOS: 13 days   Toya Palacios  [facility]   Pager on [email or web address]. If 7PM-7AM, please contact night-coverage at [email or web address]     04/03/2023, 2:53 PM

## 2023-04-04 DIAGNOSIS — R6520 Severe sepsis without septic shock: Secondary | ICD-10-CM | POA: Diagnosis not present

## 2023-04-04 DIAGNOSIS — A419 Sepsis, unspecified organism: Secondary | ICD-10-CM | POA: Diagnosis not present

## 2023-04-04 LAB — COMPREHENSIVE METABOLIC PANEL
ALT: 20 U/L (ref 0–44)
AST: 72 U/L — ABNORMAL HIGH (ref 15–41)
Albumin: <1.5 g/dL — ABNORMAL LOW (ref 3.5–5.0)
Alkaline Phosphatase: 222 U/L — ABNORMAL HIGH (ref 38–126)
Anion gap: 8 (ref 5–15)
BUN: 10 mg/dL (ref 8–23)
CO2: 17 mmol/L — ABNORMAL LOW (ref 22–32)
Calcium: 7 mg/dL — ABNORMAL LOW (ref 8.9–10.3)
Chloride: 113 mmol/L — ABNORMAL HIGH (ref 98–111)
Creatinine, Ser: 0.55 mg/dL — ABNORMAL LOW (ref 0.61–1.24)
GFR, Estimated: >60 mL/min (ref >60)
Glucose, Bld: 119 mg/dL — ABNORMAL HIGH (ref 70–99)
Potassium: 3.4 mmol/L — ABNORMAL LOW (ref 3.5–5.1)
Sodium: 138 mmol/L (ref 135–145)
Total Bilirubin: 4.4 mg/dL — ABNORMAL HIGH (ref 0.3–1.2)
Total Protein: 5.5 g/dL — ABNORMAL LOW (ref 6.5–8.1)

## 2023-04-04 LAB — CBC WITH DIFFERENTIAL/PLATELET
Abs Immature Granulocytes: 0.17 10*3/uL — ABNORMAL HIGH (ref 0.00–0.07)
Basophils Absolute: 0 10*3/uL (ref 0.0–0.1)
Basophils Relative: 0 %
Eosinophils Absolute: 0 10*3/uL (ref 0.0–0.5)
Eosinophils Relative: 0 %
HCT: 28.1 % — ABNORMAL LOW (ref 39.0–52.0)
Hemoglobin: 9.7 g/dL — ABNORMAL LOW (ref 13.0–17.0)
Immature Granulocytes: 1 %
Lymphocytes Relative: 7 %
Lymphs Abs: 1.3 10*3/uL (ref 0.7–4.0)
MCH: 32.2 pg (ref 26.0–34.0)
MCHC: 34.5 g/dL (ref 30.0–36.0)
MCV: 93.4 fL (ref 80.0–100.0)
Monocytes Absolute: 0.6 10*3/uL (ref 0.1–1.0)
Monocytes Relative: 3 %
Neutro Abs: 17.2 10*3/uL — ABNORMAL HIGH (ref 1.7–7.7)
Neutrophils Relative %: 89 %
Platelets: 297 10*3/uL (ref 150–400)
RBC: 3.01 MIL/uL — ABNORMAL LOW (ref 4.22–5.81)
RDW: 19.9 % — ABNORMAL HIGH (ref 11.5–15.5)
WBC: 19.3 10*3/uL — ABNORMAL HIGH (ref 4.0–10.5)
nRBC: 0 % (ref 0.0–0.2)

## 2023-04-04 LAB — CULTURE, BLOOD (ROUTINE X 2)
Culture: NO GROWTH
Culture: NO GROWTH
Special Requests: ADEQUATE
Special Requests: ADEQUATE

## 2023-04-04 LAB — MAGNESIUM: Magnesium: 2.4 mg/dL (ref 1.7–2.4)

## 2023-04-04 LAB — PHOSPHORUS: Phosphorus: 2.5 mg/dL (ref 2.5–4.6)

## 2023-04-04 MED ORDER — ENOXAPARIN SODIUM 40 MG/0.4ML IJ SOSY
40.0000 mg | PREFILLED_SYRINGE | INTRAMUSCULAR | Status: DC
  Administered 2023-04-04 – 2023-04-17 (×11): 40 mg via SUBCUTANEOUS
  Filled 2023-04-04 (×12): qty 0.4

## 2023-04-04 MED ORDER — POTASSIUM CHLORIDE CRYS ER 20 MEQ PO TBCR
40.0000 meq | EXTENDED_RELEASE_TABLET | Freq: Once | ORAL | Status: DC
  Filled 2023-04-04: qty 2

## 2023-04-04 MED ORDER — STERILE WATER FOR INJECTION IJ SOLN
INTRAMUSCULAR | Status: AC
  Administered 2023-04-04: 10 mL
  Filled 2023-04-04: qty 10

## 2023-04-04 NOTE — Progress Notes (Signed)
Subjective:  CC: Ryan Rose is a 62 y.o. male  Hospital stay day 14, IR cholecystostomy tube placement for cholecystitis  HPI: No acute issues reported overnight.  Objective:   Temp:  [96.4 F (35.8 C)-97.9 F (36.6 C)] 97.8 F (36.6 C) (08/25 1121) Pulse Rate:  [77-95] 77 (08/25 1000) Resp:  [11-31] 23 (08/25 1000) BP: (82-128)/(62-86) 126/86 (08/25 1000) SpO2:  [91 %-98 %] 95 % (08/25 1000)     Height: 5\' 9"  (175.3 cm) Weight: 65 kg BMI (Calculated): 21.15   Intake/Output this shift:   Intake/Output Summary (Last 24 hours) at 04/04/2023 1200 Last data filed at 04/04/2023 1021 Gross per 24 hour  Intake 655.87 ml  Output 680 ml  Net -24.13 ml    Constitutional :  alert, cooperative, appears stated age, and no distress  Respiratory:  clear to auscultation bilaterally  Cardiovascular:  regular rate and rhythm  Gastrointestinal: soft, non-tender; bowel sounds normal; no masses,  no organomegaly.  Cholecystostomy tube with thin bilious drainage  Skin: Cool and moist.   Psychiatric: Normal affect, non-agitated, not confused       LABS:     Latest Ref Rng & Units 04/04/2023    3:41 AM 04/02/2023    2:24 AM 04/01/2023    8:10 AM  CMP  Glucose 70 - 99 mg/dL 161  83  86   BUN 8 - 23 mg/dL 10  7  6    Creatinine 0.61 - 1.24 mg/dL 0.96  0.45  4.09   Sodium 135 - 145 mmol/L 138  136  136   Potassium 3.5 - 5.1 mmol/L 3.4  3.8  2.9   Chloride 98 - 111 mmol/L 113  109  109   CO2 22 - 32 mmol/L 17  20  20    Calcium 8.9 - 10.3 mg/dL 7.0  7.0  7.2   Total Protein 6.5 - 8.1 g/dL 5.5  5.6  5.2   Total Bilirubin 0.3 - 1.2 mg/dL 4.4  5.7  5.2   Alkaline Phos 38 - 126 U/L 222  218  194   AST 15 - 41 U/L 72  84  92   ALT 0 - 44 U/L 20  20  23        Latest Ref Rng & Units 04/04/2023    3:41 AM 04/02/2023    2:24 AM 04/01/2023    8:48 PM  CBC  WBC 4.0 - 10.5 K/uL 19.3  18.1    Hemoglobin 13.0 - 17.0 g/dL 9.7  81.1  9.5   Hematocrit 39.0 - 52.0 % 28.1  31.4  27.5   Platelets 150 -  400 K/uL 297  311      RADS: N/a Assessment:   Status post IR cholecystostomy tube placement for acute cholecystitis.  Stable from gallbladder standpoint.  Surgery will peripherally follow for now.  Please call with any questions  labs/images/medications/previous chart entries reviewed personally and relevant changes/updates noted above.

## 2023-04-04 NOTE — Progress Notes (Addendum)
Progress Note    Ryan Rose  ION:[id number] DOB: Sep 17, 1960  DOA: 03/21/2023 PCP: Sherron Monday, MD      Brief Narrative:    Medical records reviewed and are as summarized below:  Ryan Rose is a 62 y.o. male with medical history significant for AAA s/p repair and stenting, alcohol use disorder, CAD, hypertension, PVD, who presented to the hospital with nausea, vomiting, chest pain and abdominal pain. He developed fever, hypotension and tachycardia in the hospital.  He was found to have severe sepsis.   upper endoscopy done which showed grade D esophagitis, large hiatal hernia and single nonbleeding angiectasia in the stomach treated with argon plasma.    MRCP 8/20--Severe diffuse fatty infiltration of the liver. No focal hepatic lesions are identified without contrast. 2. Distended gallbladder with layering sludge. No definite gallstones. Gallbladder wall thickening and pericholecystic inflammatory changes could suggest acalculus cholecystitis or cholestasis. Normal caliber and course of the common bile duct. 3. Small bilateral pleural effusions with overlying atelectasis. There is also a cystic appearing fluid collection near the distal esophagus on the right side. This was not present on the prior CT scan and could be loculated pleural fluid. 4. Moderate mesenteric edema and small volume ascites.       Assessment/Plan:   Principal Problem:   Severe sepsis (HCC) Active Problems:   Acute blood loss anemia   Alcohol withdrawal delirium (HCC)   Acute upper GI bleed   Aspiration pneumonia (HCC)   Alcohol withdrawal (HCC)   Nausea & vomiting   Hypophosphatemia   Hypomagnesemia   Hypokalemia   Alcoholic ketoacidosis   PVD (peripheral vascular disease) (HCC)   Transaminitis   Macrocytic anemia   Abdominal aortic aneurysm (AAA) (HCC)   Leukocytosis   Alcohol abuse   Bilirubinemia   Hyponatremia   Arterial hypotension   Acute acalculous  cholecystitis    Body mass index is 21.16 kg/m.    Severe sepsis secondary to acalculous cholecystitis: S/p cholecystostomy tube placement on 04/02/2023.  Continue empiric IV antibiotics (Flagyl and cefepime).  Follow-up with interventional radiologist and general surgeon.   Aspiration pneumonia: He is on empiric IV antibiotics Moderate right pleural effusion and small left pleural effusion, right basilar compressive atelectasis, incentive spirometer as needed. Ordered right-sided thoracentesis to be done by IR tomorrow.   Acute hypoxic respiratory failure: He is still on 2 L/min oxygen via Cut [place].  Wean off oxygen as able.  Sinus tachycardia: Improved.  CTA chest did not show any acute pulmonary embolism.   Hypotension: Continue midodrine   Alcohol use disorder with alcohol withdrawal complicated by delirium: Continue benzodiazepines as needed per CIWA protocol. Elevated liver enzymes likely due to alcohol use disorder Severe hypoalbuminemia: Encourage adequate oral intake.  Consult dietitian.   Acute upper GI bleeding: S/p EGD on 03/25/2023 which showed grade D esophagitis, hiatal hernia and single nonbleeding angiectasia in the stomach   Acute blood loss anemia: S/p transfusion with 1 unit of PRBCs on 03/31/2023 for hemoglobin of 6.8.  H&H is stable. Start low-dose Lovenox for DVT prophylaxis   COPD, emphysema noted on CT chest: continue bronchodilators as needed.  Discontinue prednisone.   Hyponatremia, hypokalemia, hypomagnesemia, hypophosphatemia: Improved.  Replete potassium.  Monitor electrolytes and replete as needed.   Alcoholic ketoacidosis: Improved   Other comorbidities include history of AAA s/p repair and stenting, PVD, chronic anemia, extensive CAD on CT chest  Diet Order  DIET DYS 3 Room service appropriate? Yes; Fluid consistency: Thin  Diet effective now                             Consultants: Gastroenterologist Interventional radiologist General surgeon  Procedures: EGD on 03/25/2023 Cholecystostomy tube placement on 04/02/2023    Medications:    atorvastatin  40 mg Oral Daily   budesonide (PULMICORT) nebulizer solution  0.25 mg Nebulization BID   Chlorhexidine Gluconate Cloth  6 each Topical Daily   folic acid  1 mg Oral Daily   melatonin  2.5 mg Oral QHS   midodrine  10 mg Oral TID WC   multivitamin with minerals  1 tablet Oral Daily   nicotine  14 mg Transdermal Daily   pantoprazole (PROTONIX) IV  40 mg Intravenous Q12H   pneumococcal 20-valent conjugate vaccine  0.5 mL Intramuscular Tomorrow-1000   QUEtiapine  12.5 mg Oral QHS   senna-docusate  2 tablet Oral QHS   sodium chloride flush  5 mL Intracatheter Q8H   sucralfate  1 g Oral TID WC & HS   thiamine (VITAMIN B1) injection  100 mg Intravenous Daily   Continuous Infusions:  ceFEPime (MAXIPIME) IV 2 g (04/04/23 0948)   metronidazole Stopped (04/04/23 1141)     Anti-infectives (From admission, onward)    Start     Dose/Rate Route Frequency Ordered Stop   03/30/23 1800  ceFEPIme (MAXIPIME) 2 g in sodium chloride 0.9 % 100 mL IVPB        2 g 200 mL/hr over 30 Minutes Intravenous Every 8 hours 03/30/23 1235     03/30/23 1000  metroNIDAZOLE (FLAGYL) IVPB 500 mg        500 mg 100 mL/hr over 60 Minutes Intravenous Every 12 hours 03/30/23 0819 04/06/23 0959   03/30/23 0900  ceFEPIme (MAXIPIME) 2 g in sodium chloride 0.9 % 100 mL IVPB        2 g 200 mL/hr over 30 Minutes Intravenous  Once 03/30/23 0819 03/30/23 1042   03/28/23 1600  Ampicillin-Sulbactam (UNASYN) 3 g in sodium chloride 0.9 % 100 mL IVPB  Status:  Discontinued        3 g 200 mL/hr over 30 Minutes Intravenous Every 6 hours 03/28/23 1442 03/30/23 0819   03/25/23 2000  cefTRIAXone (ROCEPHIN) 1 g in sodium chloride 0.9 % 100 mL IVPB  Status:  Discontinued        1 g 200 mL/hr over 30 Minutes Intravenous Every  24 hours 03/25/23 1837 03/27/23 0719              Family Communication/Anticipated D/C date and plan/Code Status   DVT prophylaxis: Place and maintain sequential compression device Start: 03/21/23 1236     Code Status: DNR  Family Communication: None Disposition Plan: Plan to discharge to SNF   Status is: Inpatient Remains inpatient appropriate because: Confusion, hypoxia       Subjective:   Interval events noted.  He is unable to provide any history because of confusion.  Objective:    Vitals:   04/04/23 0800 04/04/23 0900 04/04/23 1000 04/04/23 1121  BP: 128/71 98/75 126/86   Pulse: 91 81 77   Resp: (!) 31 18 (!) 23   Temp:    97.8 F (36.6 C)  TempSrc:    Oral  SpO2: 93% 91% 95%   Weight:      Height:       No data found.  Intake/Output Summary (Last 24 hours) at 04/04/2023 1236 Last data filed at 04/04/2023 1021 Gross per 24 hour  Intake 655.87 ml  Output 680 ml  Net -24.13 ml   Filed Weights   03/25/23 0959 03/29/23 0623 03/30/23 1855  Weight: 63.2 kg 64.9 kg 65 kg    Exam:  GEN: NAD SKIN: Warm and dry EYES: No pallor or icterus ENT: MMM CV: RRR PULM: CTA B ABD: soft, ND, NT, +BS CNS: AAO x 1 (person), non focal EXT: No edema or tenderness      Data Reviewed:   I have personally reviewed following labs and imaging studies:  Labs: Labs show the following:   Basic Metabolic Panel: Recent Labs  Lab 03/30/23 0522 03/30/23 0842 03/30/23 1910 03/31/23 0453 04/01/23 0810 04/02/23 0224 04/04/23 0341  NA 137   < > 133* 132* 136 136 138  K 3.3*   < > 3.0* 3.6 2.9* 3.8 3.4*  CL 105   < > 101 107 109 109 113*  CO2 24   < > 22 20* 20* 20* 17*  GLUCOSE 81   < > 90 81 86 83 119*  BUN 5*   < > 5* 7* 6* 7* 10  CREATININE 0.43*   < > 0.42* 0.57* 0.42* 0.37* 0.55*  CALCIUM 7.3*   < > 6.9* 6.6* 7.2* 7.0* 7.0*  MG 2.2  --   --  1.8  --   --  2.4  PHOS 2.9  --   --  2.6  --   --  2.5   < > = values in this interval not  displayed.   GFR Estimated Creatinine Clearance: 88 mL/min (A) (by C-G formula based on SCr of 0.55 mg/dL (L)). Liver Function Tests: Recent Labs  Lab 03/30/23 0842 03/31/23 0453 04/01/23 0810 04/02/23 0224 04/04/23 0341  AST 117* 83* 92* 84* 72*  ALT 28 20 23 20 20   ALKPHOS 205* 167* 194* 218* 222*  BILITOT 5.1* 4.7* 5.2* 5.7* 4.4*  PROT 5.5* 4.9* 5.2* 5.6* 5.5*  ALBUMIN 1.6* 1.7* 1.7* 1.6* <1.5*   Recent Labs  Lab 03/30/23 0522  LIPASE 49   No results for input(s): "AMMONIA" in the last 168 hours. Coagulation profile Recent Labs  Lab 03/30/23 0522 03/31/23 0453 04/02/23 0224  INR 1.3* 1.5* 1.4*    CBC: Recent Labs  Lab 03/30/23 1910 03/31/23 0453 04/01/23 0810 04/01/23 2048 04/02/23 0224 04/04/23 0341  WBC 13.7* 12.6* 13.7*  --  18.1* 19.3*  NEUTROABS  --   --   --   --   --  17.2*  HGB 7.8* 6.8* 9.8* 9.5* 10.4* 9.7*  HCT 23.0* 20.1* 28.0* 27.5* 31.4* 28.1*  MCV 97.0 95.3 92.4  --  94.9 93.4  PLT 209 306 326  --  311 297   Cardiac Enzymes: Recent Labs  Lab 03/29/23 1750  CKTOTAL 69   BNP (last 3 results) No results for input(s): "PROBNP" in the last 8760 hours. CBG: Recent Labs  Lab 03/30/23 1904  GLUCAP 87   D-Dimer: Recent Labs    04/03/23 0017  DDIMER 3.60*   Hgb A1c: No results for input(s): "HGBA1C" in the last 72 hours. Lipid Profile: No results for input(s): "CHOL", "HDL", "LDLCALC", "TRIG", "CHOLHDL", "LDLDIRECT" in the last 72 hours. Thyroid function studies: No results for input(s): "TSH", "T4TOTAL", "T3FREE", "THYROIDAB" in the last 72 hours.  Invalid input(s): "FREET3" Anemia work up: No results for input(s): "VITAMINB12", "FOLATE", "FERRITIN", "TIBC", "IRON", "RETICCTPCT" in the last  72 hours. Sepsis Labs: Recent Labs  Lab 03/30/23 0830 03/30/23 1118 03/30/23 1453 03/30/23 1910 03/31/23 0453 04/01/23 0810 04/02/23 0224 04/04/23 0341  PROCALCITON  --   --   --  1.64  --   --   --   --   WBC  --   --   --  13.7*  12.6* 13.7* 18.1* 19.3*  LATICACIDVEN 1.4 2.5* 1.2 1.5  --   --   --   --     Microbiology Recent Results (from the past 240 hour(s))  Culture, blood (Routine X 2) w Reflex to ID Panel     Status: None   Collection Time: 03/30/23  8:29 AM   Specimen: BLOOD LEFT HAND  Result Value Ref Range Status   Specimen Description BLOOD LEFT HAND  Final   Special Requests   Final    BOTTLES DRAWN AEROBIC AND ANAEROBIC Blood Culture adequate volume   Culture   Final    NO GROWTH 5 DAYS Performed at [facility], [address] [place], [place] [address]    Report Status 04/04/2023 FINAL  Final  Culture, blood (Routine X 2) w Reflex to ID Panel     Status: None   Collection Time: 03/30/23 11:18 AM   Specimen: BLOOD LEFT ARM  Result Value Ref Range Status   Specimen Description BLOOD LEFT ARM  Final   Special Requests   Final    BOTTLES DRAWN AEROBIC AND ANAEROBIC Blood Culture adequate volume   Culture   Final    NO GROWTH 5 DAYS Performed at [facility], [address], [place], [place] [address]    Report Status 04/04/2023 FINAL  Final  MRSA Next Gen by PCR, Nasal     Status: None   Collection Time: 03/30/23  7:01 PM   Specimen: Nasal Mucosa; Nasal Swab  Result Value Ref Range Status   MRSA by PCR Next Gen NOT DETECTED NOT DETECTED Final    Comment: (NOTE) The GeneXpert MRSA Assay (FDA approved for NASAL specimens only), is one component of a comprehensive MRSA colonization surveillance program. It is not intended to diagnose MRSA infection nor to guide or monitor treatment for MRSA infections. Test performance is not FDA approved in patients less than 25 years old. Performed at [facility], [address] [place], [place] [address]     Procedures and diagnostic studies:  US Venous Img Lower Bilateral (DVT)  Result Date: 04/03/2023 CLINICAL DATA:  Elevated D-dimer EXAM: BILATERAL LOWER EXTREMITY VENOUS DOPPLER ULTRASOUND TECHNIQUE: Gray-scale  sonography with graded compression, as well as color Doppler and duplex ultrasound were performed to evaluate the lower extremity deep venous systems from the level of the common femoral vein and including the common femoral, femoral, profunda femoral, popliteal and calf veins including the posterior tibial, peroneal and gastrocnemius veins when visible. The superficial great saphenous vein was also interrogated. Spectral Doppler was utilized to evaluate flow at rest and with distal augmentation maneuvers in the common femoral, femoral and popliteal veins. COMPARISON:  None Available. FINDINGS: RIGHT LOWER EXTREMITY Common Femoral Vein: No evidence of thrombus. Normal compressibility, respiratory phasicity and response to augmentation. Saphenofemoral Junction: No evidence of thrombus. Normal compressibility and flow on color Doppler imaging. Profunda Femoral Vein: No evidence of thrombus. Normal compressibility and flow on color Doppler imaging. Femoral Vein: No evidence of thrombus. Normal compressibility, respiratory phasicity and response to augmentation. Popliteal Vein: No evidence of thrombus. Normal compressibility, respiratory phasicity and response to augmentation.  Calf Veins: No evidence of thrombus. Normal compressibility and flow on color Doppler imaging. Superficial Great Saphenous Vein: No evidence of thrombus. Normal compressibility. Venous Reflux:  None. Other Findings:  None. LEFT LOWER EXTREMITY Common Femoral Vein: No evidence of thrombus. Normal compressibility, respiratory phasicity and response to augmentation. Saphenofemoral Junction: No evidence of thrombus. Normal compressibility and flow on color Doppler imaging. Profunda Femoral Vein: No evidence of thrombus. Normal compressibility and flow on color Doppler imaging. Femoral Vein: No evidence of thrombus. Normal compressibility, respiratory phasicity and response to augmentation. Popliteal Vein: No evidence of thrombus. Normal  compressibility, respiratory phasicity and response to augmentation. Calf Veins: No evidence of thrombus. Normal compressibility and flow on color Doppler imaging. Superficial Great Saphenous Vein: No evidence of thrombus. Normal compressibility. Venous Reflux:  None. Other Findings:  None. IMPRESSION: No evidence of deep venous thrombosis in either lower extremity. Electronically Signed   By: Malachy Moan M.D.   On: 04/03/2023 08:00   CT Angio Chest Pulmonary Embolism (PE) W or WO Contrast  Result Date: 04/03/2023 CLINICAL DATA:  Pulmonary embolism (PE) suspected, high prob EXAM: CT ANGIOGRAPHY CHEST WITH CONTRAST TECHNIQUE: Multidetector CT imaging of the chest was performed using the standard protocol during bolus administration of intravenous contrast. Multiplanar CT image reconstructions and MIPs were obtained to evaluate the vascular anatomy. RADIATION DOSE REDUCTION: This exam was performed according to the departmental dose-optimization program which includes automated exposure control, adjustment of the mA and/or kV according to patient size and/or use of iterative reconstruction technique. CONTRAST:  OMNIPAQUE IOHEXOL 350 MG/ML SOLN COMPARISON:  03/21/2023 FINDINGS: Cardiovascular: No filling defects in the pulmonary arteries to suggest pulmonary emboli. Heart is normal size. Aorta is normal caliber. Extensive coronary artery and moderate aortic calcifications. Mediastinum/Nodes: No mediastinal, hilar, or axillary adenopathy. Trachea and esophagus are unremarkable. Thyroid unremarkable. Lungs/Pleura: Moderate centrilobular emphysema. Moderate right pleural effusion and small left pleural effusion. Compressive atelectasis in the lower lobes. Right middle lobe atelectasis. Upper Abdomen: No acute findings. Diffuse low-density throughout the liver compatible with fatty infiltration. Musculoskeletal: Chest wall soft tissues are unremarkable. No acute bony abnormality. Review of the MIP images  confirms the above findings. IMPRESSION: No evidence of pulmonary embolus. Extensive coronary artery disease. Moderate right pleural effusion and small left pleural effusion. Bibasilar compressive atelectasis. Aortic Atherosclerosis (ICD10-I70.0) and Emphysema (ICD10-J43.9). Hepatic steatosis. Electronically Signed   By: Charlett Nose M.D.   On: 04/03/2023 01:32   DG Chest Port 1 View  Result Date: 04/02/2023 CLINICAL DATA:  Shortness of breath. EXAM: PORTABLE CHEST 1 VIEW COMPARISON:  Radiograph 03/28/2023, CT 03/21/2023 FINDINGS: Chronic elevation of right hemidiaphragm. Overall lower lung volumes from prior exam. Minimal right pleural effusion. Stable heart size and mediastinal contours. No pulmonary edema or pneumothorax. IMPRESSION: Minimal right pleural effusion. Overall lower lung volumes from prior exam. Electronically Signed   By: Narda Rutherford M.D.   On: 04/02/2023 21:50               LOS: 14 days   Jenya Putz  [facility]   Pager on [email or web address]. If 7PM-7AM, please contact night-coverage at [email or web address]     04/04/2023, 12:36 PM

## 2023-04-04 NOTE — Progress Notes (Addendum)
0700 patient alert to self unable to make all needs known 1000 patient will not eat breakfast multiple attempts and ordered what he said he liked to eat  1020 stop clock placed on biliary drain to prevent contamination with flushing

## 2023-04-04 NOTE — Plan of Care (Signed)
  Problem: Education: Goal: Knowledge of General Education information will improve Description: Including pain rating scale, medication(s)/side effects and non-pharmacologic comfort measures Outcome: Not Progressing   Problem: Health Behavior/Discharge Planning: Goal: Ability to manage health-related needs will improve 04/04/2023 1038 by Aretta Nip, RN Outcome: Not Progressing 04/04/2023 1038 by Aretta Nip, RN Outcome: Not Progressing   Problem: Clinical Measurements: Goal: Ability to maintain clinical measurements within normal limits will improve 04/04/2023 1038 by Aretta Nip, RN Outcome: Not Progressing 04/04/2023 1038 by Aretta Nip, RN Outcome: Not Progressing Goal: Will remain free from infection 04/04/2023 1038 by Aretta Nip, RN Outcome: Not Progressing 04/04/2023 1038 by Aretta Nip, RN Outcome: Not Progressing Goal: Diagnostic test results will improve 04/04/2023 1038 by Aretta Nip, RN Outcome: Not Progressing 04/04/2023 1038 by Aretta Nip, RN Outcome: Not Progressing Goal: Respiratory complications will improve 04/04/2023 1038 by Aretta Nip, RN Outcome: Not Progressing 04/04/2023 1038 by Aretta Nip, RN Outcome: Not Progressing Goal: Cardiovascular complication will be avoided 04/04/2023 1038 by Aretta Nip, RN Outcome: Not Progressing 04/04/2023 1038 by Aretta Nip, RN Outcome: Not Progressing   Problem: Activity: Goal: Risk for activity intolerance will decrease 04/04/2023 1038 by Aretta Nip, RN Outcome: Not Progressing 04/04/2023 1038 by Aretta Nip, RN Outcome: Not Progressing   Problem: Nutrition: Goal: Adequate nutrition will be maintained 04/04/2023 1038 by Aretta Nip, RN Outcome: Not Progressing 04/04/2023 1038 by Aretta Nip, RN Outcome: Not Progressing   Problem: Coping: Goal: Level of anxiety will decrease 04/04/2023 1038 by Aretta Nip, RN Outcome: Not Progressing 04/04/2023 1038  by Aretta Nip, RN Outcome: Not Progressing   Problem: Elimination: Goal: Will not experience complications related to bowel motility 04/04/2023 1038 by Aretta Nip, RN Outcome: Not Progressing 04/04/2023 1038 by Aretta Nip, RN Outcome: Not Progressing Goal: Will not experience complications related to urinary retention 04/04/2023 1038 by Aretta Nip, RN Outcome: Not Progressing 04/04/2023 1038 by Aretta Nip, RN Outcome: Not Progressing   Problem: Pain Managment: Goal: General experience of comfort will improve 04/04/2023 1038 by Aretta Nip, RN Outcome: Progressing 04/04/2023 1038 by Aretta Nip, RN Outcome: Not Progressing   Problem: Safety: Goal: Ability to remain free from injury will improve 04/04/2023 1038 by Aretta Nip, RN Outcome: Not Progressing 04/04/2023 1038 by Aretta Nip, RN Outcome: Not Progressing   Problem: Clinical Measurements: Goal: Diagnostic test results will improve 04/04/2023 1038 by Aretta Nip, RN Outcome: Not Progressing 04/04/2023 1038 by Aretta Nip, RN Outcome: Not Progressing Goal: Signs and symptoms of infection will decrease 04/04/2023 1038 by Aretta Nip, RN Outcome: Not Progressing 04/04/2023 1038 by Aretta Nip, RN Outcome: Not Progressing   Problem: Respiratory: Goal: Ability to maintain adequate ventilation will improve 04/04/2023 1038 by Aretta Nip, RN Outcome: Not Progressing 04/04/2023 1038 by Aretta Nip, RN Outcome: Not Progressing

## 2023-04-04 NOTE — Plan of Care (Signed)
  Problem: Clinical Measurements: Goal: Respiratory complications will improve Outcome: Progressing   Problem: Clinical Measurements: Goal: Ability to maintain clinical measurements within normal limits will improve Outcome: Progressing   Problem: Clinical Measurements: Goal: Cardiovascular complication will be avoided Outcome: Progressing   Problem: Pain Managment: Goal: General experience of comfort will improve Outcome: Progressing   Problem: Safety: Goal: Ability to remain free from injury will improve Outcome: Progressing

## 2023-04-05 ENCOUNTER — Inpatient Hospital Stay: Payer: Commercial Managed Care - PPO | Admitting: Radiology

## 2023-04-05 DIAGNOSIS — K922 Gastrointestinal hemorrhage, unspecified: Secondary | ICD-10-CM | POA: Diagnosis not present

## 2023-04-05 DIAGNOSIS — A419 Sepsis, unspecified organism: Secondary | ICD-10-CM | POA: Diagnosis not present

## 2023-04-05 DIAGNOSIS — K819 Cholecystitis, unspecified: Secondary | ICD-10-CM | POA: Diagnosis not present

## 2023-04-05 DIAGNOSIS — F1010 Alcohol abuse, uncomplicated: Secondary | ICD-10-CM | POA: Diagnosis not present

## 2023-04-05 LAB — CBC WITH DIFFERENTIAL/PLATELET
Abs Immature Granulocytes: 0.14 10*3/uL — ABNORMAL HIGH (ref 0.00–0.07)
Basophils Absolute: 0 10*3/uL (ref 0.0–0.1)
Basophils Relative: 0 %
Eosinophils Absolute: 0 10*3/uL (ref 0.0–0.5)
Eosinophils Relative: 0 %
HCT: 30.1 % — ABNORMAL LOW (ref 39.0–52.0)
Hemoglobin: 10.3 g/dL — ABNORMAL LOW (ref 13.0–17.0)
Immature Granulocytes: 1 %
Lymphocytes Relative: 7 %
Lymphs Abs: 1.2 10*3/uL (ref 0.7–4.0)
MCH: 32.1 pg (ref 26.0–34.0)
MCHC: 34.2 g/dL (ref 30.0–36.0)
MCV: 93.8 fL (ref 80.0–100.0)
Monocytes Absolute: 0.7 10*3/uL (ref 0.1–1.0)
Monocytes Relative: 4 %
Neutro Abs: 16.3 10*3/uL — ABNORMAL HIGH (ref 1.7–7.7)
Neutrophils Relative %: 88 %
Platelets: 296 10*3/uL (ref 150–400)
RBC: 3.21 MIL/uL — ABNORMAL LOW (ref 4.22–5.81)
RDW: 19.7 % — ABNORMAL HIGH (ref 11.5–15.5)
WBC: 18.3 10*3/uL — ABNORMAL HIGH (ref 4.0–10.5)
nRBC: 0 % (ref 0.0–0.2)

## 2023-04-05 LAB — BASIC METABOLIC PANEL
Anion gap: 11 (ref 5–15)
BUN: 14 mg/dL (ref 8–23)
CO2: 18 mmol/L — ABNORMAL LOW (ref 22–32)
Calcium: 7.6 mg/dL — ABNORMAL LOW (ref 8.9–10.3)
Chloride: 115 mmol/L — ABNORMAL HIGH (ref 98–111)
Creatinine, Ser: 0.7 mg/dL (ref 0.61–1.24)
GFR, Estimated: >60 mL/min (ref >60)
Glucose, Bld: 115 mg/dL — ABNORMAL HIGH (ref 70–99)
Potassium: 3 mmol/L — ABNORMAL LOW (ref 3.5–5.1)
Sodium: 144 mmol/L (ref 135–145)

## 2023-04-05 LAB — LACTATE DEHYDROGENASE: LDH: 239 U/L — ABNORMAL HIGH (ref 98–192)

## 2023-04-05 MED ORDER — POTASSIUM CHLORIDE 20 MEQ PO PACK
40.0000 meq | PACK | Freq: Two times a day (BID) | ORAL | Status: AC
  Filled 2023-04-05: qty 2

## 2023-04-05 MED ORDER — MORPHINE SULFATE (PF) 2 MG/ML IV SOLN: 1.0000 mg | Freq: Four times a day (QID) | INTRAVENOUS | Status: DC | PRN

## 2023-04-05 MED ORDER — MIDODRINE HCL 5 MG PO TABS
5.0000 mg | ORAL_TABLET | Freq: Three times a day (TID) | ORAL | Status: AC
  Filled 2023-04-05 (×2): qty 1

## 2023-04-05 MED ORDER — ACETAMINOPHEN 325 MG PO TABS
650.0000 mg | ORAL_TABLET | Freq: Four times a day (QID) | ORAL | Status: DC | PRN
  Filled 2023-04-05 (×2): qty 2

## 2023-04-05 MED ORDER — LIDOCAINE HCL 1 % IJ SOLN
INTRAMUSCULAR | Status: AC
  Filled 2023-04-05: qty 20

## 2023-04-05 MED ORDER — ENSURE ENLIVE PO LIQD
237.0000 mL | Freq: Three times a day (TID) | ORAL | Status: DC
  Administered 2023-04-07 – 2023-04-23 (×8): 237 mL via ORAL

## 2023-04-05 MED ORDER — CLOPIDOGREL BISULFATE 75 MG PO TABS: 75.0000 mg | ORAL_TABLET | Freq: Every day | ORAL | Status: DC

## 2023-04-05 MED ORDER — OXYCODONE HCL 5 MG PO TABS: 5.0000 mg | ORAL_TABLET | Freq: Four times a day (QID) | ORAL | Status: DC | PRN

## 2023-04-05 MED ORDER — ACETAMINOPHEN 650 MG RE SUPP: 650.0000 mg | Freq: Four times a day (QID) | RECTAL | Status: DC | PRN

## 2023-04-05 MED ORDER — PANTOPRAZOLE SODIUM 40 MG PO TBEC
40.0000 mg | DELAYED_RELEASE_TABLET | Freq: Every day | ORAL | Status: DC
  Administered 2023-04-07 – 2023-04-17 (×7): 40 mg via ORAL
  Filled 2023-04-05 (×9): qty 1

## 2023-04-05 MED ORDER — PIPERACILLIN-TAZOBACTAM 3.375 G IVPB: 3.3750 g | Freq: Three times a day (TID) | INTRAVENOUS | Status: DC

## 2023-04-05 MED ORDER — ASPIRIN 81 MG PO TBEC
81.0000 mg | DELAYED_RELEASE_TABLET | Freq: Every day | ORAL | Status: DC
  Administered 2023-04-07 – 2023-04-17 (×7): 81 mg via ORAL
  Filled 2023-04-05 (×9): qty 1

## 2023-04-05 NOTE — Progress Notes (Addendum)
                                                     Palliative Care Progress Note, Assessment & Plan   Patient Name: Ryan Rose       Date: 04/05/2023 DOB: 1961/06/23  Age: 63 y.o. MRN#: [id number] Attending Physician: Enedina Finner, MD Primary Care Physician: Sherron Monday, MD Admit Date: 03/21/2023  Chart reviewed.   I counseled with attending Dr. Allena Katz. No acute palliative needs at this time.   Pt has a DNR/DNI code status. Therefore, golden rod paper DNR form completed and placed on shadow chart.   PMT will continue to follow and support patient throughout his hospitalization.   Samara Deist L. Bonita Quin, DNP, FNP-BC Palliative Medicine Team  No charge

## 2023-04-05 NOTE — Progress Notes (Addendum)
[facility]  - [place] at [facility]   PATIENT NAME: Ryan Rose    MR#:  [id number]  DATE OF BIRTH:  1961/03/13  SUBJECTIVE:  no family at bedside. Spoke with patient's spouse Ryan Rose. Updated Ryan Rose that patient has likely got dementia secondary to chronic alcoholism and other multiple comorbidities. Poor PO intake. Pulled IV out. Not allowing RN to place IV. Has mittens on. Tells me he is in [place]. Able to tell me his name and wife's name. Not oriented to time and place  VITALS:  Blood pressure (!) 131/102, pulse 97, temperature 97.7 F (36.5 C), resp. rate 18, height 5\' 9"  (1.753 m), weight 65 kg, SpO2 97%.  PHYSICAL EXAMINATION:   GENERAL:  62 y.o.-year-old patient with no acute distress. Chronically ill, confused LUNGS: Normal breath sounds bilaterally, no wheezing CARDIOVASCULAR: S1, S2 normal. + tachycardia ABDOMEN: Soft, nontender, nondistended. No guarding rigidity GB drain+ EXTREMITIES: No  edema b/l.    NEUROLOGIC: nonfocal  patient is confused and difficult to redirect.   LABORATORY PANEL:  CBC Recent Labs  Lab 04/05/23 0652  WBC 18.3*  HGB 10.3*  HCT 30.1*  PLT 296    Chemistries  Recent Labs  Lab 04/04/23 0341 04/05/23 0652  NA 138 144  K 3.4* 3.0*  CL 113* 115*  CO2 17* 18*  GLUCOSE 119* 115*  BUN 10 14  CREATININE 0.55* 0.70  CALCIUM 7.0* 7.6*  MG 2.4  --   AST 72*  --   ALT 20  --   ALKPHOS 222*  --   BILITOT 4.4*  --     Assessment and Plan Ryan Rose is a 62 y.o. male with medical history significant of AAA status post repair and stenting, alcohol abuse, CAD, HTN, PVD, presented with persistent nauseous vomiting chest pain abdominal pain.   upper endoscopy done which showed grade D esophagitis, large hiatal hernia and single nonbleeding angiectasia in the stomach treated with argon plasma.   MRCP 8/20--Severe diffuse fatty infiltration of the liver. No focal hepatic lesions are identified without  contrast. 2. Distended gallbladder with layering sludge. No definite gallstones. Gallbladder wall thickening and pericholecystic inflammatory changes could suggest acalculus cholecystitis or cholestasis. Normal caliber and course of the common bile duct. 3. Small bilateral pleural effusions with overlying atelectasis. There is also a cystic appearing fluid collection near the distal esophagus on the right side. This was not present on the prior CT scan and could be loculated pleural fluid. 4. Moderate mesenteric edema and small volume ascites.  Severe sepsis (HCC) Acalculus cholecystitis --  Lactic acid 2.5.   --Cont IV Maxipime and Flagyl.  Patient with tachycardia, leukocytosis and fever and hypotension.   --MRCP as above --Gen surgery consult--secure chat sent to dr Toula Moos --8/22-- HIDA scan and abnormal. Discussed with general surgery. Patient is not a surgical candidate. Recommends gallbladder drain placement. I reached out to IR Dr. Fredia Sorrow. In comparison to ultrasound done in July appears this could be chronic. IR recommends repeat right upper quadrant ultrasound. Clinically patient does not have any abdominal pain. Blood pressure stable. White count remains at 13.7. No fever. -- Left message with Ryan Rose of above. ----addendum: right upper quadrant ultrasound completed. Discussed with Dr. Fredia Sorrow IR and Dr. Dolores Frame patient is tachycardic has fever of 101.0. Plan is to place gallbladder drain tomorrow. Will continue broad-spectrum IV antibiotics and IV fluids for now including PRN pain meds. Will keep patient NPO after midnight. -Left another voicemail for patient's wife  Ryan Rose. --\-s/p GB drain placement. Start PO diet. Continue antibiotics, PT OT to see patient. -- Patient is quite confused. Poor PO intake. Pulled IV out per RN. Unable to place IV due to patient not being cooperative. Will try to see if he can get IV placed after giving IM Ativan. --change to IV zosyn for now --  Dietitian to see patient.   Alcohol withdrawal delirium (HCC) dementia suspect alcohol related --Received 2 days of high-dose thiamine.   --As needed Librium.   --Right now patient does not have capacity make medical decisions.  Reach out to Spouse Ryan Rose -- patient quite confused. Not oriented to time place. Tells me his name and his wife's name.   Acute blood loss anemia Transfused 1 unit of packed red blood cells on 8/16 on a hemoglobin of 6.5.  -- Last hemoglobin 6.8--got 1 unit on 8/21-- continues to drop hemoglobin will have G.I. come see patient again --hgb 10.3   Acute upper GI bleed EGD on 8/15 shows grade D esophagitis, hiatal hernia and angiectasia which was treated with argon plasma coagulation.  --cont PPI po   Aspiration pneumonia (HCC) -- on IV abxs    Hypophosphatemia  Hypomagnesemia  Hypokalemia Replace as needed   Alcoholic ketoacidosis Resolved   PVD (peripheral vascular disease) (HCC) Resume asa for now and if hgb continues to remain stable will resume Plavix at discharge  Continue atorvastatin.   Transaminitis Secondary to alcohol abuse.  Alkaline phosphatase 205, total bilirubin of 5.1, AST 117, albumin low at 1.6    Abdominal aortic aneurysm (AAA) (HCC) No endoleak on CTA on admission.   Macrocytic anemia MCV down to 96.8   Hyponatremia Resolved  hypokalemia replete orally   patient has multiple comorbidities. Overall long-term prognosis poor. Ryan Rose understands. Palliative care input appreciated.  Procedures:GB drain placement by IR on 8/23 Family communication :Ryan Rose on the phone Consults : G.I., ICU, general surgery CODE STATUS: DNR/DNI DVT Prophylaxis : SCD Level of care: Progressive Status is: Inpatient Remains inpatient appropriate because: poor PO intake, dementia, awaiting placement  TOTAL TIME TAKING CARE OF THIS PATIENT: 40 minutes.  >50% time spent on counselling and coordination of care  Note: This dictation was prepared  with Dragon dictation along with smaller phrase technology. Any transcriptional errors that result from this process are unintentional.  Enedina Finner M.D    [facility]   CC: Primary care physician; Sherron Monday, MD

## 2023-04-05 NOTE — Progress Notes (Signed)
Upon arrival to shift this AM, patient did not have an IV. This Rn attempted to pkace an IV twice and was unsuccessful. IV team was consulted. IV team attempted once and then was not allowed any more attempts by patient. Patient is without an IV at this time. Provider (Dr Enedina Finner) aware.

## 2023-04-05 NOTE — Progress Notes (Signed)
PT Cancellation Note  Patient Details Name: Ryan Rose MRN: [id number] DOB: 28-Dec-1960   Cancelled Treatment:    Reason Eval/Treat Not Completed: Patient at procedure or test/unavailable.  Chart reviewed and attempted to see pt.  Upon arrival pt not in room.  Unit clerk noted pt was just taken to interventional radiology.  Will re-attempt at later date/time as medically appropriate.   Nolon Bussing, PT, DPT Physical Therapist - [facility]  04/05/23, 3:01 PM

## 2023-04-05 NOTE — TOC Progression Note (Addendum)
Transition of Care [facility]) - Progression Note    Patient Details  Name: Ryan Rose MRN: [id number] Date of Birth: September 04, 1960  Transition of Care [facility]) CM/SW Contact  Truddie Hidden, RN Phone Number: 04/05/2023, 11:09 AM  Clinical Narrative:    Bed search for [place] SNFs initiated.   Attempt to reach [name] at [facility] for update on status of referral sent.   Patient wife, [name], updated regarding facility search for [facility] She was agreeable to  the search being extended. She was advised to apply for LTSS MCD via [facility] DSS. The address and phone number for [facility] was  provided.         Expected Discharge Plan and Services                                               Social Determinants of Health (SDOH) Interventions SDOH Screenings   Food Insecurity: Patient Declined (03/21/2023)  Housing: Patient Declined (03/21/2023)  Transportation Needs: No Transportation Needs (03/21/2023)  Utilities: Not At Risk (03/21/2023)  Tobacco Use: High Risk (04/01/2023)    Readmission Risk Interventions     No data to display

## 2023-04-05 NOTE — Progress Notes (Signed)
Initial Nutrition Assessment  DOCUMENTATION CODES:   Non-severe (moderate) malnutrition in context of chronic illness  INTERVENTION:   -MVI with minerals daily -Ensure Enlive po TID, each supplement provides 350 kcal and 20 grams of protein -Downgrade diet to dysphagia 2 for ease of intake -Feeding assistance with meals   NUTRITION DIAGNOSIS:   Moderate Malnutrition related to chronic illness (ETOH abuse) as evidenced by percent weight loss, mild fat depletion, moderate fat depletion, mild muscle depletion, moderate muscle depletion.  GOAL:   Patient will meet greater than or equal to 90% of their needs  MONITOR:   PO intake, Supplement acceptance  REASON FOR ASSESSMENT:   Consult Assessment of nutrition requirement/status  ASSESSMENT:   Pt with medical history significant of AAA status post repair and stenting, alcohol abuse, CAD, HTN, PVD, presented with persistent nauseous vomiting chest pain abdominal pain.  Pt admitted with severe sepsis and cholecystitis.   8/23- s/p Placement of 10 Fr perc chole tube, placed to bag drain    Reviewed I/O's: -440 ml x 24 hours and +4.7 L since 03/22/23  Pt lying in bed at time of visit. Noted tray unattempted at bedside. RD offered to assist pt with meals tray, however, refused. Per nursing report, pt has also been refusing offer of food and liquids for multiple shifts. Noted meal completions 0-50%.    Pt reports poor po intake PTA for "a long time". Pt unable to provide further details, diet recall, or commonly consumed foods. Noted pt with no teeth; pt amenable to diet downgrade for ease of intake.   Pt denies use of nutritional supplements, but is requesting "wine". RD attempted to reorient pt to place and situation. Pt amenable to try supplements.   Case discussed with RN and MD. Pt is confused; MD concerned for alcohol induced dementia. Pt is pulling lines and IV out. RD broached possibility of NGT. Per MD, pt has also been  refusing care. Of note, pt resistant to nutrition-focused physical exam.   Reviewed wt hx; pt has experienced a 15.7% wt loss over the past 2 months, which is not significant for time frame.   Per TOC notes, bed search in place for SNF. Palliative care also following for goals of care discussions.   Medications reviewed and include folic acid, MVI, senna-docusate, catafate, and thiamine.   Lab Results  Component Value Date   HGBA1C 4.4 (L) 03/21/2023   PTA DM medications are none.   Labs reviewed: K: 3.0, CBGS: 87 (inpatient orders for glycemic control are none).    NUTRITION - FOCUSED PHYSICAL EXAM:  Flowsheet Row Most Recent Value  Orbital Region Mild depletion  Upper Arm Region Moderate depletion  Thoracic and Lumbar Region Unable to assess  Buccal Region Mild depletion  Temple Region Mild depletion  Clavicle Bone Region Moderate depletion  Clavicle and Acromion Bone Region Unable to assess  Scapular Bone Region Unable to assess  Dorsal Hand Unable to assess  Patellar Region Moderate depletion  Anterior Thigh Region Moderate depletion  Posterior Calf Region Moderate depletion  Edema (RD Assessment) None  Hair Reviewed  Eyes Reviewed  Mouth Reviewed  Skin Reviewed  Nails Reviewed       Diet Order:   Diet Order             DIET DYS 2 Room service appropriate? Yes; Fluid consistency: Thin  Diet effective now                   EDUCATION NEEDS:  No education needs have been identified at this time  Skin:     Last BM:  04/05/23 (type 7)  Height:   Ht Readings from Last 1 Encounters:  03/30/23 5\' 9"  (1.753 m)    Weight:   Wt Readings from Last 1 Encounters:  03/30/23 65 kg    Ideal Body Weight:  72.7 kg  BMI:  Body mass index is 21.16 kg/m.  Estimated Nutritional Needs:   Kcal:  2100-2300  Protein:  100-115 grams  Fluid:  > 2 L    Levada Schilling, RD, LDN, CDCES Registered Dietitian II Certified Diabetes Care and Education  Specialist Please refer to [facility] for RD and/or RD on-call/weekend/after hours pager

## 2023-04-05 NOTE — Progress Notes (Signed)
Patient refused to eat breakfast and was trying to hit nursing staff saying "I'm good, I'm good, go away"

## 2023-04-06 DIAGNOSIS — Z515 Encounter for palliative care: Secondary | ICD-10-CM | POA: Diagnosis not present

## 2023-04-06 DIAGNOSIS — K922 Gastrointestinal hemorrhage, unspecified: Secondary | ICD-10-CM | POA: Diagnosis not present

## 2023-04-06 DIAGNOSIS — K819 Cholecystitis, unspecified: Secondary | ICD-10-CM | POA: Diagnosis not present

## 2023-04-06 DIAGNOSIS — F1010 Alcohol abuse, uncomplicated: Secondary | ICD-10-CM | POA: Diagnosis not present

## 2023-04-06 DIAGNOSIS — F10931 Alcohol use, unspecified with withdrawal delirium: Secondary | ICD-10-CM | POA: Diagnosis not present

## 2023-04-06 DIAGNOSIS — E8729 Other acidosis: Secondary | ICD-10-CM | POA: Diagnosis not present

## 2023-04-06 DIAGNOSIS — K810 Acute cholecystitis: Secondary | ICD-10-CM | POA: Diagnosis not present

## 2023-04-06 DIAGNOSIS — A419 Sepsis, unspecified organism: Secondary | ICD-10-CM | POA: Diagnosis not present

## 2023-04-06 DIAGNOSIS — E440 Moderate protein-calorie malnutrition: Secondary | ICD-10-CM | POA: Insufficient documentation

## 2023-04-06 LAB — BASIC METABOLIC PANEL
Anion gap: 8 (ref 5–15)
BUN: 12 mg/dL (ref 8–23)
CO2: 20 mmol/L — ABNORMAL LOW (ref 22–32)
Calcium: 7.8 mg/dL — ABNORMAL LOW (ref 8.9–10.3)
Chloride: 114 mmol/L — ABNORMAL HIGH (ref 98–111)
Creatinine, Ser: 0.76 mg/dL (ref 0.61–1.24)
GFR, Estimated: >60 mL/min (ref >60)
Glucose, Bld: 83 mg/dL (ref 70–99)
Potassium: 3.3 mmol/L — ABNORMAL LOW (ref 3.5–5.1)
Sodium: 142 mmol/L (ref 135–145)

## 2023-04-06 MED ORDER — AMOXICILLIN-POT CLAVULANATE 875-125 MG PO TABS
1.0000 | ORAL_TABLET | Freq: Two times a day (BID) | ORAL | Status: AC
  Administered 2023-04-06 – 2023-04-09 (×7): 1 via ORAL
  Filled 2023-04-06 (×7): qty 1

## 2023-04-06 MED ORDER — LORAZEPAM 2 MG/ML IJ SOLN
1.0000 mg | Freq: Three times a day (TID) | INTRAMUSCULAR | Status: DC | PRN
  Administered 2023-04-12: 1 mg via INTRAVENOUS
  Filled 2023-04-06: qty 1

## 2023-04-06 MED ORDER — POTASSIUM CHLORIDE 20 MEQ PO PACK
40.0000 meq | PACK | Freq: Three times a day (TID) | ORAL | Status: AC
  Administered 2023-04-06: 40 meq via ORAL
  Filled 2023-04-06 (×2): qty 2

## 2023-04-06 NOTE — Progress Notes (Signed)
PT Cancellation Note  Patient Details Name: Ryan Rose MRN: [id number] DOB: [birth year]   Cancelled Treatment:     Pt declined any activity. Will re-attempt next available date/time per POC.   Jannet Askew 04/06/2023, 2:49 PM

## 2023-04-06 NOTE — Progress Notes (Signed)
Referring Physician(s): Campbell Lerner, MD  Supervising Physician: Pernell Dupre  Patient Status:  [facility] - In-pt  Chief Complaint:  Acute acalculous cholecystitis s/p percutaneous cholecystostomy 04/02/23  Subjective:  Patient disoriented and agitated at time of exam. Became somewhat aggressive when attempt was made to visualize percutaneous cholecystostomy drain insertion site.  Allergies: Sulfa antibiotics  Medications: Prior to Admission medications   Medication Sig Start Date End Date Taking? Authorizing Provider  acetaminophen (TYLENOL) 500 MG tablet Take one or two tablet by mouth every 6  hours as needed for pain 05/13/22  Yes   aspirin 81 MG chewable tablet Chew 81 mg by mouth daily as needed.   Yes [provider]  atorvastatin (LIPITOR) 40 MG tablet Take 1 tablet (40 mg total) by mouth daily. 08/13/22 08/08/23 Yes Agbor-Etang, Arlys John, MD  clopidogrel (PLAVIX) 75 MG tablet Take 1 tablet (75 mg total) by mouth daily. 09/10/22  Yes Pace, Brien R, NP  losartan (COZAAR) 25 MG tablet Take 1 tablet (25 mg total) by mouth daily. 08/13/22 08/08/23 Yes Agbor-Etang, Arlys John, MD  melatonin 3 MG TABS tablet Take 3 mg by mouth at bedtime.   Yes [provider]  senna-docusate (SENOKOT-S) 8.6-50 MG tablet Take 2 tablets by mouth at bedtime. For constipation.  Also available over-the-counter 05/30/22  Yes Rai, Ripudeep K, MD     Vital Signs: BP 102/78 (BP Location: Left Arm)   Pulse 98   Temp (!) 97.3 F (36.3 C) (Oral)   Resp (!) 23   Ht 5\' 9"  (1.753 m)   Wt 143 lb 4.8 oz (65 kg)   SpO2 95%   BMI 21.16 kg/m   Physical Exam Vitals reviewed.  Abdominal:     Comments: RUQ dressing in place with percutaneous cholecystostomy drain to gravity bag. ~25 mL of bilious output in drain at time of exam. Unable to visualize perc chole insertion site as patient agitated and aggressive on exam today  Skin:    General: Skin is warm and dry.  Neurological:     Mental  Status: He is alert. He is disoriented.  Psychiatric:        Behavior: Behavior is agitated.     Imaging: IR US CHEST  Result Date: 04/05/2023 CLINICAL DATA:  142230 Pleural effusion 142230 EXAM: CHEST ULTRASOUND COMPARISON:  None Available. FINDINGS: Patient was placed in the lateral decubitus position for thoracentesis planning. Preprocedure imaging of the chest demonstrates a relatively small effusion by ultrasound. The patient was unable to be appropriately positioned for safe image guided thoracentesis. Procedure aborted. IMPRESSION: Relatively small right pleural effusion by ultrasound. The patient was unable to be appropriately positioned for safe image guided thoracentesis. Procedure not performed at this time. Electronically Signed   By: Olive Bass M.D.   On: 04/05/2023 15:27   US Venous Img Lower Bilateral (DVT)  Result Date: 04/03/2023 CLINICAL DATA:  Elevated D-dimer EXAM: BILATERAL LOWER EXTREMITY VENOUS DOPPLER ULTRASOUND TECHNIQUE: Gray-scale sonography with graded compression, as well as color Doppler and duplex ultrasound were performed to evaluate the lower extremity deep venous systems from the level of the common femoral vein and including the common femoral, femoral, profunda femoral, popliteal and calf veins including the posterior tibial, peroneal and gastrocnemius veins when visible. The superficial great saphenous vein was also interrogated. Spectral Doppler was utilized to evaluate flow at rest and with distal augmentation maneuvers in the common femoral, femoral and popliteal veins. COMPARISON:  None Available. FINDINGS: RIGHT LOWER EXTREMITY Common Femoral Vein:  No evidence of thrombus. Normal compressibility, respiratory phasicity and response to augmentation. Saphenofemoral Junction: No evidence of thrombus. Normal compressibility and flow on color Doppler imaging. Profunda Femoral Vein: No evidence of thrombus. Normal compressibility and flow on color Doppler imaging.  Femoral Vein: No evidence of thrombus. Normal compressibility, respiratory phasicity and response to augmentation. Popliteal Vein: No evidence of thrombus. Normal compressibility, respiratory phasicity and response to augmentation. Calf Veins: No evidence of thrombus. Normal compressibility and flow on color Doppler imaging. Superficial Great Saphenous Vein: No evidence of thrombus. Normal compressibility. Venous Reflux:  None. Other Findings:  None. LEFT LOWER EXTREMITY Common Femoral Vein: No evidence of thrombus. Normal compressibility, respiratory phasicity and response to augmentation. Saphenofemoral Junction: No evidence of thrombus. Normal compressibility and flow on color Doppler imaging. Profunda Femoral Vein: No evidence of thrombus. Normal compressibility and flow on color Doppler imaging. Femoral Vein: No evidence of thrombus. Normal compressibility, respiratory phasicity and response to augmentation. Popliteal Vein: No evidence of thrombus. Normal compressibility, respiratory phasicity and response to augmentation. Calf Veins: No evidence of thrombus. Normal compressibility and flow on color Doppler imaging. Superficial Great Saphenous Vein: No evidence of thrombus. Normal compressibility. Venous Reflux:  None. Other Findings:  None. IMPRESSION: No evidence of deep venous thrombosis in either lower extremity. Electronically Signed   By: Malachy Moan M.D.   On: 04/03/2023 08:00   CT Angio Chest Pulmonary Embolism (PE) W or WO Contrast  Result Date: 04/03/2023 CLINICAL DATA:  Pulmonary embolism (PE) suspected, high prob EXAM: CT ANGIOGRAPHY CHEST WITH CONTRAST TECHNIQUE: Multidetector CT imaging of the chest was performed using the standard protocol during bolus administration of intravenous contrast. Multiplanar CT image reconstructions and MIPs were obtained to evaluate the vascular anatomy. RADIATION DOSE REDUCTION: This exam was performed according to the departmental dose-optimization  program which includes automated exposure control, adjustment of the mA and/or kV according to patient size and/or use of iterative reconstruction technique. CONTRAST:  OMNIPAQUE IOHEXOL 350 MG/ML SOLN COMPARISON:  03/21/2023 FINDINGS: Cardiovascular: No filling defects in the pulmonary arteries to suggest pulmonary emboli. Heart is normal size. Aorta is normal caliber. Extensive coronary artery and moderate aortic calcifications. Mediastinum/Nodes: No mediastinal, hilar, or axillary adenopathy. Trachea and esophagus are unremarkable. Thyroid unremarkable. Lungs/Pleura: Moderate centrilobular emphysema. Moderate right pleural effusion and small left pleural effusion. Compressive atelectasis in the lower lobes. Right middle lobe atelectasis. Upper Abdomen: No acute findings. Diffuse low-density throughout the liver compatible with fatty infiltration. Musculoskeletal: Chest wall soft tissues are unremarkable. No acute bony abnormality. Review of the MIP images confirms the above findings. IMPRESSION: No evidence of pulmonary embolus. Extensive coronary artery disease. Moderate right pleural effusion and small left pleural effusion. Bibasilar compressive atelectasis. Aortic Atherosclerosis (ICD10-I70.0) and Emphysema (ICD10-J43.9). Hepatic steatosis. Electronically Signed   By: Charlett Nose M.D.   On: 04/03/2023 01:32   DG Chest Port 1 View  Result Date: 04/02/2023 CLINICAL DATA:  Shortness of breath. EXAM: PORTABLE CHEST 1 VIEW COMPARISON:  Radiograph 03/28/2023, CT 03/21/2023 FINDINGS: Chronic elevation of right hemidiaphragm. Overall lower lung volumes from prior exam. Minimal right pleural effusion. Stable heart size and mediastinal contours. No pulmonary edema or pneumothorax. IMPRESSION: Minimal right pleural effusion. Overall lower lung volumes from prior exam. Electronically Signed   By: Narda Rutherford M.D.   On: 04/02/2023 21:50    Labs:  CBC: Recent Labs    04/01/23 0810 04/01/23 2048  04/02/23 0224 04/04/23 0341 04/05/23 0652  WBC 13.7*  --  18.1* 19.3* 18.3*  HGB 9.8* 9.5* 10.4* 9.7* 10.3*  HCT 28.0* 27.5* 31.4* 28.1* 30.1*  PLT 326  --  311 297 296    COAGS: Recent Labs    05/28/22 1840 09/09/22 1358 03/21/23 0649 03/30/23 0522 03/31/23 0453 04/02/23 0224  INR 1.0 1.2 1.1 1.3* 1.5* 1.4*  APTT 28 >200*  --   --   --   --     BMP: Recent Labs    04/02/23 0224 04/04/23 0341 04/05/23 0652 04/06/23 0834  NA 136 138 144 142  K 3.8 3.4* 3.0* 3.3*  CL 109 113* 115* 114*  CO2 20* 17* 18* 20*  GLUCOSE 83 119* 115* 83  BUN 7* 10 14 12   CALCIUM 7.0* 7.0* 7.6* 7.8*  CREATININE 0.37* 0.55* 0.70 0.76  GFRNONAA >60 >60 >60 >60    LIVER FUNCTION TESTS: Recent Labs    03/31/23 0453 04/01/23 0810 04/02/23 0224 04/04/23 0341  BILITOT 4.7* 5.2* 5.7* 4.4*  AST 83* 92* 84* 72*  ALT 20 23 20 20   ALKPHOS 167* 194* 218* 222*  PROT 4.9* 5.2* 5.6* 5.5*  ALBUMIN 1.7* 1.7* 1.6* <1.5*    Assessment and Plan: Acute cholecystitis s/p percutaneous cholecystostomy  -Patient agitated on exam today, unable to visualize drain insertion site due to patient agitation -~25 mL of bilious fluid in gravity bag at time of exam, 100 mL reported output over prior 24 hours -IR will continue to follow, please contact IR team with any questions or concerns   Plan Percutaneous cholecystostomy drain to remain in place at least 6 weeks.   Recommend fluoroscopy with injection of the drain in IR to evaluate for patency of the cystic duct.  If the duct is patent and general surgery feels patient is stable for cholecystectomy, the drain would be removed at time of surgery.  If the duct is patent and general surgery feels patient is NEVER a candidate for cholecystectomy, drain can be capped for a trial.  If symptoms recur, then place to gravity bag again.  If trial is successful, discuss possible removal of the drain.  If trial in unsuccessful, then patient will need routine  exchanges of the  chole tube about every 8-10 weeks.  Please call the IR PA at [phone] when patient is about to be discharged and we will arrange the follow up drain injection (ok to leave message).     Electronically Signed: Kennieth Francois, PA-C 04/06/2023, 2:26 PM   I spent a total of 15 Minutes at the the patient's bedside AND on the patient's hospital floor or unit, greater than 50% of which was counseling/coordinating care for acute cholecystitis s/p percutaneous cholecystostomy placement.

## 2023-04-06 NOTE — Progress Notes (Signed)
[facility]  - [place] [facility]   PATIENT NAME: Ryan Rose    MR#:  [id number]  DATE OF BIRTH:  05-Feb-1961  SUBJECTIVE:  no family at bedside.  Patient has refused for IV team and RN to get/start new IV. So far cooperating by taking meds. There times he refuses. Sats 95 on room air. VITALS:  Blood pressure 102/78, pulse 98, temperature (!) 97.3 F (36.3 C), temperature source Oral, resp. rate (!) 23, height 5\' 9"  (1.753 m), weight 65 kg, SpO2 95%.  PHYSICAL EXAMINATION:   GENERAL:  63 y.o.-year-old patient with no acute distress LUNGS: Normal breath sounds bilaterally, no wheezing CARDIOVASCULAR: S1, S2 normal. + tachycardia ABDOMEN: Soft, nontender, nondistended. No guarding rigidity GB drain+ EXTREMITIES: No  edema b/l.    NEUROLOGIC: nonfocal  patient is confused at baseline however does follow commands and redirectable today   LABORATORY PANEL:  CBC Recent Labs  Lab 04/05/23 0652  WBC 18.3*  HGB 10.3*  HCT 30.1*  PLT 296    Chemistries  Recent Labs  Lab 04/04/23 0341 04/05/23 0652 04/06/23 0834  NA 138   < > 142  K 3.4*   < > 3.3*  CL 113*   < > 114*  CO2 17*   < > 20*  GLUCOSE 119*   < > 83  BUN 10   < > 12  CREATININE 0.55*   < > 0.76  CALCIUM 7.0*   < > 7.8*  MG 2.4  --   --   AST 72*  --   --   ALT 20  --   --   ALKPHOS 222*  --   --   BILITOT 4.4*  --   --    < > = values in this interval not displayed.    Assessment and Plan [name] is a 62 y.o. male with medical history significant of AAA status post repair and stenting, alcohol abuse, CAD, HTN, PVD, presented with persistent nauseous vomiting chest pain abdominal pain.   upper endoscopy done which showed grade D esophagitis, large hiatal hernia and single nonbleeding angiectasia in the stomach treated with argon plasma.   MRCP 8/20--Severe diffuse fatty infiltration of the liver. No focal hepatic lesions are identified without contrast. 2. Distended  gallbladder with layering sludge. No definite gallstones. Gallbladder wall thickening and pericholecystic inflammatory changes could suggest acalculus cholecystitis or cholestasis. Normal caliber and course of the common bile duct. 3. Small bilateral pleural effusions with overlying atelectasis. There is also a cystic appearing fluid collection near the distal esophagus on the right side. This was not present on the prior CT scan and could be loculated pleural fluid. 4. Moderate mesenteric edema and small volume ascites.  Severe sepsis (HCC) Acalculus cholecystitis --  Lactic acid 2.5.   --Cont IV Maxipime and Flagyl.  Patient with tachycardia, leukocytosis and fever and hypotension.   --MRCP as above --Gen surgery consult--secure chat sent to dr Toula Moos --8/22-- HIDA scan and abnormal. Discussed with general surgery. Patient is not a surgical candidate. Recommends gallbladder drain placement. I reached out to IR Dr. Fredia Sorrow. In comparison to ultrasound done in July appears this could be chronic. IR recommends repeat right upper quadrant ultrasound. Clinically patient does not have any abdominal pain. Blood pressure stable. White count remains at 13.7. No fever. -- Left message with [name] of above. ----addendum: right upper quadrant ultrasound completed. Discussed with Dr. Fredia Sorrow IR and Dr. Dolores Frame patient is tachycardic has fever of  101.0. Plan is to place gallbladder drain tomorrow. Will continue broad-spectrum IV antibiotics and IV fluids for now including PRN pain meds. Will keep patient NPO after midnight. -Left another voicemail for patient's wife [name]. --\-s/p GB drain placement. Start PO diet. Continue antibiotics, PT OT to see patient. -- Patient is quite confused. Poor PO intake. Pulled IV out per RN. Unable to place IV due to patient not being cooperative. Will try to see if he can get IV placed after giving IM Ativan. --change to IV zosyn for now -- Dietitian to see  patient. -- Since multiple attempts have failed to place IV will change to oral Augmentin for remainder of days. Patient overall is stable.   Alcohol withdrawal delirium (HCC) dementia suspect alcohol related --Received 2 days of high-dose thiamine.   --As needed Librium.   --Right now patient does not have capacity make medical decisions.  Reach out to Spouse tammy -- confused at baseline however is redirected well.  Acute blood loss anemia Transfused 1 unit of packed red blood cells on 8/16 on a hemoglobin of 6.5.  -- Last hemoglobin 6.8--got 1 unit on 8/21-- continues to drop hemoglobin will have G.I. come see patient again --hgb 10.3   Acute upper GI bleed EGD on 8/15 shows grade D esophagitis, hiatal hernia and angiectasia which was treated with argon plasma coagulation.  --cont PPI po   Aspiration pneumonia (HCC) -- on abxs --sats 955 on RA    Hypophosphatemia  Hypomagnesemia  Hypokalemia Replace as needed   Alcoholic ketoacidosis Resolved   PVD (peripheral vascular disease) (HCC) --Resume asa for now and if hgb continues to remain stable will resume Plavix at discharge  Continue atorvastatin.   Transaminitis Secondary to alcohol abuse.  Alkaline phosphatase 205, total bilirubin of 5.1, AST 117, albumin low at 1.6    Abdominal aortic aneurysm (AAA) (HCC) No endoleak on CTA on admission.   Macrocytic anemia MCV down to 96.8   Hyponatremia Resolved     patient has multiple comorbidities. Overall long-term prognosis poor. [name] understands. Palliative care input appreciated.  Procedures:GB drain placement by IR on 8/23 Family communication :[name] on the phone Consults : G.I., ICU, general surgery CODE STATUS: DNR/DNI DVT Prophylaxis : SCD Level of care: Med-Surg Status is: Inpatient Remains inpatient appropriate because: awaiting placement. TOC has started insurance authorization for rehab.   TOTAL TIME TAKING CARE OF THIS PATIENT: 35 minutes.  >50%  time spent on counselling and coordination of care  Note: This dictation was prepared with Dragon dictation along with smaller phrase technology. Any transcriptional errors that result from this process are unintentional.  Ryan Rose M.D    [facility]   CC: Primary care physician; Sherron Monday, MD

## 2023-04-06 NOTE — Progress Notes (Signed)
                                                     Palliative Care Progress Note, Assessment & Plan   Patient Name: Ryan Rose       Date: 04/06/2023 DOB: 1961-04-15  Age: 62 y.o. MRN#: [id number] Attending Physician: Enedina Finner, MD Primary Care Physician: Sherron Monday, MD Admit Date: 03/21/2023  Subjective: Patient is lying in bed with RNs at bedside providing linen change.  He acknowledges my presence.  He appears to be in no apparent distress.  However, he is speaking in nonlinear, nonsensical words.  He does not appear agitated and is cooperative with cleaning and turning.  No family or friends present during my visit.  HPI: 62 y.o. male  with past medical history of AAA status post repair and stenting, alcohol abuse, CAD, HTN, PVD admitted on 03/21/2023 with N/V/CP/abd pain.   Patient is being treated for severe sepsis with acalculous cholecystitis, alcohol withdrawal delirium, blood loss anemia, and aspiration pneumonia.   PMT was consulted to discuss goals of care.  Summary of counseling/coordination of care: After reviewing the patient's chart and assessing the patient at bedside, I spoke with RN in regards to symptom management and plan of care.  RN Shanda Bumps endorses patient continues to refuse p.o. intake.  IV access has been lost and multiple attempts to replace that have failed.  However, Shanda Bumps endorses patient has been cooperative with her.  She shares he is agitated but will follow commands.  Discussed use of IM medications if patient becomes increasingly agitated.  However, current redirection and calming efforts appear to be helping the patient remain cooperative.  As per chart review, patient's wife has accepted bed offer for SNF.   In light of patient transferring to a different medical facility, I called and spoke with his  wife [name] in regards to a MOST form. Discussed importance of further outlining patient's wishes beyond a DNR. [name] endorses she would like to complete a MOST form but does not plan to be bedside to visit patient. Electronic copy of MOST form sent for [name] to review. We scheduled to speak tomorrow, 8/28, at 1030am to  review patient and [name]'s wishes and to electronically complete MOST form.   PMT will continue to follow.   Physical Exam Vitals reviewed.  Constitutional:      General: He is not in acute distress.    Appearance: He is normal weight.     Comments: Thin, frail, temporal wasting  HENT:     Head: Normocephalic.  Cardiovascular:     Rate and Rhythm: Normal rate.  Pulmonary:     Effort: Pulmonary effort is normal.  Skin:    General: Skin is warm and dry.  Neurological:     Mental Status: He is alert.     Comments: Oriented to self  Psychiatric:        Mood and Affect: Mood is not anxious.             Total Time 25 minutes   Baraa Tubbs L. Bonita Quin, DNP, FNP-BC Palliative Medicine Team

## 2023-04-06 NOTE — Progress Notes (Signed)
Subjective:  CC: Ryan Rose is a 62 y.o. male  Hospital stay day 16, IR cholecystostomy tube placement for cholecystitis  HPI: No acute issues reported overnight.  Objective:   Temp:  [97.3 F (36.3 C)-98.1 F (36.7 C)] 97.3 F (36.3 C) (08/27 0830) Pulse Rate:  [98-108] 98 (08/27 0830) Resp:  [18-23] 23 (08/27 0830) BP: (102-140)/(78-88) 102/78 (08/27 0830) SpO2:  [95 %-100 %] 95 % (08/27 0935) FiO2 (%):  [28 %] 28 % (08/26 1928)     Height: 5\' 9"  (175.3 cm) Weight: 65 kg BMI (Calculated): 21.15   Intake/Output this shift:   Intake/Output Summary (Last 24 hours) at 04/06/2023 1305 Last data filed at 04/06/2023 0000 Gross per 24 hour  Intake 0 ml  Output 100 ml  Net -100 ml    Constitutional :  alert, cooperative, appears stated age, and no distress  Respiratory:  clear to auscultation bilaterally  Cardiovascular:  regular rate and rhythm  Gastrointestinal: soft, non-tender; bowel sounds normal; no masses,  no organomegaly.  Cholecystostomy tube with thin bilious drainage  Skin: Cool and moist.   Psychiatric: Normal affect, non-agitated, not confused       LABS:     Latest Ref Rng & Units 04/06/2023    8:34 AM 04/05/2023    6:52 AM 04/04/2023    3:41 AM  CMP  Glucose 70 - 99 mg/dL 83  191  478   BUN 8 - 23 mg/dL 12  14  10    Creatinine 0.61 - 1.24 mg/dL 2.95  6.21  3.08   Sodium 135 - 145 mmol/L 142  144  138   Potassium 3.5 - 5.1 mmol/L 3.3  3.0  3.4   Chloride 98 - 111 mmol/L 114  115  113   CO2 22 - 32 mmol/L 20  18  17    Calcium 8.9 - 10.3 mg/dL 7.8  7.6  7.0   Total Protein 6.5 - 8.1 g/dL   5.5   Total Bilirubin 0.3 - 1.2 mg/dL   4.4   Alkaline Phos 38 - 126 U/L   222   AST 15 - 41 U/L   72   ALT 0 - 44 U/L   20       Latest Ref Rng & Units 04/05/2023    6:52 AM 04/04/2023    3:41 AM 04/02/2023    2:24 AM  CBC  WBC 4.0 - 10.5 K/uL 18.3  19.3  18.1   Hemoglobin 13.0 - 17.0 g/dL 65.7  9.7  84.6   Hematocrit 39.0 - 52.0 % 30.1  28.1  31.4   Platelets  150 - 400 K/uL 296  297  311     RADS: N/a Assessment:   Status post IR cholecystostomy tube placement for acute cholecystitis.  Stable from gallbladder standpoint.  Surgery will peripherally follow for now.  Please call with any questions  labs/images/medications/previous chart entries reviewed personally and relevant changes/updates noted above.

## 2023-04-06 NOTE — TOC Progression Note (Addendum)
Transition of Care [facility]) - Progression Note    Patient Details  Name: Ryan Rose MRN: [id number] Date of Birth: September 29, 1960  Transition of Care [facility]) CM/SW Contact  Truddie Hidden, RN Phone Number: 04/06/2023, 10:30 AM  Clinical Narrative:    Patient has bed offer for [facility].  Spoke with Irving Burton in admissions to confirm bed offer  Spoke with patient's wife, [name] to give bed offer. She is agreeable to bed offer. She reported she will apply for LTSS MCD today at DSS.   Monia Pouch auth started by [facility] assistance.  `   3:40pm Spoke with Mrs. Vear Clock. She reported patient LTSS MCD application was submitted and is being process by [facility] DSS.       Expected Discharge Plan and Services                                               Social Determinants of Health (SDOH) Interventions SDOH Screenings   Food Insecurity: Patient Declined (03/21/2023)  Housing: Patient Declined (03/21/2023)  Transportation Needs: No Transportation Needs (03/21/2023)  Utilities: Not At Risk (03/21/2023)  Tobacco Use: High Risk (04/01/2023)    Readmission Risk Interventions     No data to display

## 2023-04-06 NOTE — Plan of Care (Signed)
  Problem: Elimination: Goal: Will not experience complications related to urinary retention Outcome: Progressing   Problem: Education: Goal: Knowledge of General Education information will improve Description: Including pain rating scale, medication(s)/side effects and non-pharmacologic comfort measures Outcome: Not Progressing   Problem: Health Behavior/Discharge Planning: Goal: Ability to manage health-related needs will improve Outcome: Not Progressing   Problem: Activity: Goal: Risk for activity intolerance will decrease Outcome: Not Progressing   Problem: Nutrition: Goal: Adequate nutrition will be maintained Outcome: Not Progressing   Problem: Coping: Goal: Level of anxiety will decrease Outcome: Not Progressing   Problem: Elimination: Goal: Will not experience complications related to bowel motility Outcome: Not Progressing   Problem: Skin Integrity: Goal: Risk for impaired skin integrity will decrease Outcome: Not Progressing

## 2023-04-07 DIAGNOSIS — E8729 Other acidosis: Secondary | ICD-10-CM | POA: Diagnosis not present

## 2023-04-07 DIAGNOSIS — R6520 Severe sepsis without septic shock: Secondary | ICD-10-CM | POA: Diagnosis not present

## 2023-04-07 DIAGNOSIS — F10931 Alcohol use, unspecified with withdrawal delirium: Secondary | ICD-10-CM | POA: Diagnosis not present

## 2023-04-07 DIAGNOSIS — K810 Acute cholecystitis: Secondary | ICD-10-CM | POA: Diagnosis not present

## 2023-04-07 DIAGNOSIS — A419 Sepsis, unspecified organism: Secondary | ICD-10-CM | POA: Diagnosis not present

## 2023-04-07 LAB — CBC
HCT: 36.2 % — ABNORMAL LOW (ref 39.0–52.0)
Hemoglobin: 11.8 g/dL — ABNORMAL LOW (ref 13.0–17.0)
MCH: 31.3 pg (ref 26.0–34.0)
MCHC: 32.6 g/dL (ref 30.0–36.0)
MCV: 96 fL (ref 80.0–100.0)
Platelets: 220 10*3/uL (ref 150–400)
RBC: 3.77 MIL/uL — ABNORMAL LOW (ref 4.22–5.81)
RDW: 19.7 % — ABNORMAL HIGH (ref 11.5–15.5)
WBC: 16.2 10*3/uL — ABNORMAL HIGH (ref 4.0–10.5)
nRBC: 0 % (ref 0.0–0.2)

## 2023-04-07 LAB — BASIC METABOLIC PANEL
Anion gap: 3 — ABNORMAL LOW (ref 5–15)
BUN: 10 mg/dL (ref 8–23)
CO2: 19 mmol/L — ABNORMAL LOW (ref 22–32)
Calcium: 7.4 mg/dL — ABNORMAL LOW (ref 8.9–10.3)
Chloride: 117 mmol/L — ABNORMAL HIGH (ref 98–111)
Creatinine, Ser: 0.63 mg/dL (ref 0.61–1.24)
GFR, Estimated: >60 mL/min (ref >60)
Glucose, Bld: 114 mg/dL — ABNORMAL HIGH (ref 70–99)
Potassium: 2.9 mmol/L — ABNORMAL LOW (ref 3.5–5.1)
Sodium: 139 mmol/L (ref 135–145)

## 2023-04-07 LAB — MAGNESIUM: Magnesium: 1.8 mg/dL (ref 1.7–2.4)

## 2023-04-07 LAB — PROCALCITONIN: Procalcitonin: 0.73 ng/mL

## 2023-04-07 MED ORDER — POTASSIUM CHLORIDE CRYS ER 20 MEQ PO TBCR
40.0000 meq | EXTENDED_RELEASE_TABLET | ORAL | Status: AC
  Administered 2023-04-07 (×2): 40 meq via ORAL
  Filled 2023-04-07 (×2): qty 2

## 2023-04-07 NOTE — Progress Notes (Signed)
PT Cancellation Note  Patient Details Name: WALLEY MARSH MRN: [id number] DOB: Oct 29, 1960   Cancelled Treatment:     PT attempt. Pt is severely confused and unwilling to participate. Starts to get agitated with max encouragement. Eventually," I need you to get out now!" Acute PT will continue efforts. Cognition, availability, and willingness to participate has greatly impacted pt's progress with PT thus far this admission. We will continue to follow as able per current POC.    Rushie Chestnut 04/07/2023, 1:00 PM

## 2023-04-07 NOTE — Progress Notes (Signed)
Physical Therapy Treatment Patient Details Name: Ryan Rose MRN: [id number] DOB: 1960/11/10 Today's Date: 04/07/2023   History of Present Illness Pt is a 62 y/o M admitted on 03/21/23 after presenting with c/c of N&V, chest & abdominal pain. Pt is being treated for alcohol withdrawal delirium, ABLA, acute upper GI bleed. PMH: AAA s/p repair & stenting, alcohol abuse, CAD, HTN, PVD    PT Comments  Pt was long sitting in bed with sister at bedside. Pt is alert but remains only oriented to self. Poor ability to follow commands or perform desired task. Highly recommend +2 assistance for any all mobility. Recommend use of hoyer lift for any OOB activity. Acute PT will continue to follow and progress per current POC.    If plan is discharge home, recommend the following: A lot of help with walking and/or transfers;A lot of help with bathing/dressing/bathroom;Two people to help with bathing/dressing/bathroom;Two people to help with walking and/or transfers;Help with stairs or ramp for entrance;Assist for transportation;Assistance with cooking/housework;Assistance with feeding;Direct supervision/assist for financial management;Direct supervision/assist for medications management     Equipment Recommendations  Other (comment) (defer to next level of care)       Precautions / Restrictions Precautions Precautions: Fall Restrictions Weight Bearing Restrictions: No     Mobility  Bed Mobility Overal bed mobility: Needs Assistance Bed Mobility: Rolling, Supine to Sit, Sit to Supine Rolling: Max assist, Used rails Supine to sit: Max assist Sit to supine: Total assist     Transfers  General transfer comment: attempted to stand however pt unable to clear hips to achieve full upright standing     Balance Overall balance assessment: Needs assistance Sitting-balance support: Feet supported, Bilateral upper extremity supported Sitting balance-Leahy Scale: Poor Sitting balance - Comments:  Initial posterior push that improved with time. High fall risk in standing.    Cognition Arousal: Alert Behavior During Therapy: Anxious Overall Cognitive Status: History of cognitive impairments - at baseline Area of Impairment: Orientation, Attention, Memory, Following commands, Safety/judgement, Problem solving, Awareness  Orientation Level: Disoriented to, Place, Time, Situation    General Comments: Pt is alert but severely confused. inconsistently follows simple commands               Pertinent Vitals/Pain Pain Assessment Pain Assessment: No/denies pain     PT Goals (current goals can now be found in the care plan section) Acute Rehab PT Goals Patient Stated Goal: none stated Progress towards PT goals: Progressing toward goals    Frequency    Min 1X/week       AM-PAC PT "6 Clicks" Mobility   Outcome Measure  Help needed turning from your back to your side while in a flat bed without using bedrails?: A Lot Help needed moving from lying on your back to sitting on the side of a flat bed without using bedrails?: A Lot Help needed moving to and from a bed to a chair (including a wheelchair)?: A Lot Help needed standing up from a chair using your arms (e.g., wheelchair or bedside chair)?: Total Help needed to walk in hospital room?: Total Help needed climbing 3-5 steps with a railing? : Total 6 Click Score: 9    End of Session   Activity Tolerance: Patient tolerated treatment well Patient left: in bed;with call bell/phone within reach;with nursing/sitter in room;with bed alarm set Nurse Communication: Mobility status PT Visit Diagnosis: Muscle weakness (generalized) (M62.81);Difficulty in walking, not elsewhere classified (R26.2);Other abnormalities of gait and mobility (R26.89)     Time:  4098-1191 PT Time Calculation (min) (ACUTE ONLY): 20 min  Charges:    $Therapeutic Activity: 8-22 mins PT General Charges $$ ACUTE PT VISIT: 1 Visit                     Jetta Lout PTA 04/07/23, 3:53 PM

## 2023-04-07 NOTE — Progress Notes (Signed)
PROGRESS NOTE    Ryan Rose  QIO:[id number] DOB: [birth year] DOA: 03/21/2023 PCP: Sherron Monday, MD  [phone]  LOS: 17 days   Brief hospital course:   Assessment & Plan: Ryan Rose is a 62 y.o. male with medical history significant of AAA status post repair and stenting, alcohol abuse, CAD, HTN, PVD, presented with persistent nauseous vomiting chest pain abdominal pain.    upper endoscopy done which showed grade D esophagitis, large hiatal hernia and single nonbleeding angiectasia in the stomach treated with argon plasma.    MRCP 8/20--Severe diffuse fatty infiltration of the liver. No focal hepatic lesions are identified without contrast. 2. Distended gallbladder with layering sludge. No definite gallstones. Gallbladder wall thickening and pericholecystic inflammatory changes could suggest acalculus cholecystitis or cholestasis. Normal caliber and course of the common bile duct. 3. Small bilateral pleural effusions with overlying atelectasis. There is also a cystic appearing fluid collection near the distal esophagus on the right side. This was not present on the prior CT scan and could be loculated pleural fluid. 4. Moderate mesenteric edema and small volume ascites.   Severe sepsis (HCC) Acalculus cholecystitis S/p drain  --  Lactic acid 2.5.   --Patient with tachycardia, leukocytosis and fever and hypotension.   --MRCP as above --Gen surgery consulted --8/22-- HIDA scan and abnormal. Discussed with general surgery. Patient is not a surgical candidate. Recommends gallbladder drain placement.  --received 2 days of Unasyn f/b 6 days of cefepime and flagyl.  Transitioned to Augmentin when pt lost IV. Plan: --cont Augmentin --cont drain management   Alcohol withdrawal delirium (HCC) dementia suspect alcohol related --Received 2 days of high-dose thiamine.   --Right now patient does not have capacity make medical decisions.  Reach out to Spouse tammy --  confused at baseline however is redirected well.   Acute blood loss anemia Macrocytic anemia --s/p 2u pRBC --monitor Hgb and transfuse to keep Hgb >7   Acute upper GI bleed EGD on 8/15 shows grade D esophagitis, hiatal hernia and angiectasia which was treated with argon plasma coagulation.  --cont PPI po   Aspiration pneumonia (HCC) --on abx, treated    Hypophosphatemia  Hypomagnesemia  Hypokalemia Replace as needed   Alcoholic ketoacidosis Resolved   PVD (peripheral vascular disease) (HCC) --hold plavix --cont ASA and statin   Transaminitis Secondary to alcohol abuse.  Alkaline phosphatase 205, total bilirubin of 5.1, AST 117, albumin low at 1.6    Abdominal aortic aneurysm (AAA) (HCC) No endoleak on CTA on admission.   Hyponatremia Resolved     DVT prophylaxis: Lovenox SQ Code Status: DNR  Family Communication:  Level of care: Med-Surg Dispo:   The patient is from: home Anticipated d/c is to: SNF Anticipated d/c date is: whenever bed available   Subjective and Interval History:  Pt was irritated and didn't want to answer any questions.   Objective: Vitals:   04/06/23 1925 04/07/23 0419 04/07/23 0724 04/07/23 0752  BP: 106/86 102/82  104/85  Pulse: 98 96  95  Resp: 20 20  (!) 21  Temp: 97.6 F (36.4 C) 97.7 F (36.5 C)  97.6 F (36.4 C)  TempSrc: Oral Oral    SpO2: 93% 95% 94% 95%  Weight:      Height:        Intake/Output Summary (Last 24 hours) at 04/07/2023 1928 Last data filed at 04/07/2023 1921 Gross per 24 hour  Intake 10 ml  Output 55 ml  Net -45 ml   [employer]  03/25/23 0959 03/29/23 0623 03/30/23 1855  Weight: 63.2 kg 64.9 kg 65 kg    Examination:   Constitutional: NAD, alert, did not answer orientation questions HEENT: conjunctivae and lids normal, EOMI CV: No cyanosis.   RESP: normal respiratory effort, on RA Neuro: II - XII grossly intact.   Psych: grouchy mood and affect.     Data Reviewed: I have personally  reviewed labs and imaging studies  Time spent: 35 minutes  Darlin Priestly, MD [facility] If 7PM-7AM, please contact night-coverage 04/07/2023, 7:28 PM

## 2023-04-07 NOTE — TOC Progression Note (Signed)
Transition of Care [facility]) - Progression Note    Patient Details  Name: Ryan Rose MRN: [id number] Date of Birth: [birth year]  [facility] CM/SW Contact  Chapman Fitch, RN Phone Number: 04/07/2023, 9:53 AM  Clinical Narrative:     Insurance auth pending in availity portal        Expected Discharge Plan and Services                                               Social Determinants of Health (SDOH) Interventions SDOH Screenings   Food Insecurity: Patient Declined (03/21/2023)  Housing: Patient Declined (03/21/2023)  Transportation Needs: No Transportation Needs (03/21/2023)  Utilities: Not At Risk (03/21/2023)  Tobacco Use: High Risk (04/01/2023)    Readmission Risk Interventions     No data to display

## 2023-04-07 NOTE — Progress Notes (Signed)
Palliative Care Progress Note, Assessment & Plan   Patient Name: Ryan Rose       Date: 04/07/2023 DOB: 11/01/1960  Age: 62 y.o. MRN#: [id number] Attending Physician: Darlin Priestly, MD Primary Care Physician: Sherron Monday, MD Admit Date: 03/21/2023  Subjective: Patient is sitting up in bed and attempting to eat from his lunch tray.  He acknowledges my presence and is able to make his wishes known. No family or friends present during my visit.  HPI: 62 y.o. male  with past medical history of AAA status post repair and stenting, alcohol abuse, CAD, HTN, PVD admitted on 03/21/2023 with N/V/CP/abd pain.   Patient is being treated for severe sepsis with acalculous cholecystitis, alcohol withdrawal delirium, blood loss anemia, and aspiration pneumonia.   PMT was consulted to discuss goals of care.  Summary of counseling/coordination of care: After reviewing the patient's chart, I attempted to assess the patient at bedside.  As I entered the room, he put his hand out and told me "bye-bye".  Asked if I can enter the room and chat with him.  He declined.  Asked if he was in pain or discomfort and he was able to confirm no.  He asked that I leave him alone for now.  After assessing the patient at bedside, I spoke with patient's wife [name] over the phone in regards to boundaries of care.  [name] has reviewed MOST form and is prepared to complete it today.  We reviewed sections of MOST form and completed it electronically with the following:  Cardiopulmonary Resuscitation: Do Not Attempt Resuscitation (DNR/No CPR)  Medical Interventions: Limited Additional Interventions: Use medical treatment, IV fluids and cardiac monitoring as indicated, DO NOT USE intubation or mechanical ventilation. May consider use of  less invasive airway support such as BiPAP or CPAP. Also provide comfort measures. Transfer to the hospital if indicated. Avoid intensive care.   Antibiotics: Antibiotics if indicated  IV Fluids: IV fluids for a defined trial period  Feeding Tube: No feeding tube    Discussed patient's overall functional, nutritional, and cognitive status as important indicators of his overall prognosis.  Conveyed my concerns that patient's p.o. intake ebbs and flows as well as his willingness to accept medications.  [name] shares concern that she is not sure that patient will get better but her goal is that he is placed somewhere where he is safe.  I assured her TOC is following closely and will communicate when insurance authorization/discharge planning is more concrete.  DNR remains.  Goals are clear.  Symptom burden remains low.  [name] was encouraged to contact PMT with any future acute palliative needs.   PMT will monitor the patient peripherally and shadow his chart. Please re-engage with PMT if goals change, at patient/family's request, or if patient's health deteriorates during hospitalization.    Physical Exam Vitals reviewed.  Constitutional:      General: He is not in acute distress. HENT:     Head: Normocephalic.  Pulmonary:     Effort: Pulmonary effort is normal.  Skin:    Coloration: Skin is not pale.  Neurological:     Mental Status: He is alert.  Psychiatric:  Mood and Affect: Mood is not anxious.        Behavior: Behavior is not agitated.             Total Time 50 minutes   Adalia Pettis L. Bonita Quin, DNP, FNP-BC Palliative Medicine Team

## 2023-04-08 DIAGNOSIS — R6520 Severe sepsis without septic shock: Secondary | ICD-10-CM | POA: Diagnosis not present

## 2023-04-08 DIAGNOSIS — A419 Sepsis, unspecified organism: Secondary | ICD-10-CM | POA: Diagnosis not present

## 2023-04-08 LAB — BASIC METABOLIC PANEL
Anion gap: 6 (ref 5–15)
BUN: 9 mg/dL (ref 8–23)
CO2: 19 mmol/L — ABNORMAL LOW (ref 22–32)
Calcium: 7.4 mg/dL — ABNORMAL LOW (ref 8.9–10.3)
Chloride: 115 mmol/L — ABNORMAL HIGH (ref 98–111)
Creatinine, Ser: 0.52 mg/dL — ABNORMAL LOW (ref 0.61–1.24)
GFR, Estimated: >60 mL/min (ref >60)
Glucose, Bld: 94 mg/dL (ref 70–99)
Potassium: 2.9 mmol/L — ABNORMAL LOW (ref 3.5–5.1)
Sodium: 140 mmol/L (ref 135–145)

## 2023-04-08 LAB — CBC
HCT: 33.3 % — ABNORMAL LOW (ref 39.0–52.0)
Hemoglobin: 10.8 g/dL — ABNORMAL LOW (ref 13.0–17.0)
MCH: 31.2 pg (ref 26.0–34.0)
MCHC: 32.4 g/dL (ref 30.0–36.0)
MCV: 96.2 fL (ref 80.0–100.0)
Platelets: 234 10*3/uL (ref 150–400)
RBC: 3.46 MIL/uL — ABNORMAL LOW (ref 4.22–5.81)
RDW: 19.7 % — ABNORMAL HIGH (ref 11.5–15.5)
WBC: 17 10*3/uL — ABNORMAL HIGH (ref 4.0–10.5)
nRBC: 0 % (ref 0.0–0.2)

## 2023-04-08 LAB — MAGNESIUM: Magnesium: 1.9 mg/dL (ref 1.7–2.4)

## 2023-04-08 MED ORDER — POTASSIUM CHLORIDE CRYS ER 20 MEQ PO TBCR
40.0000 meq | EXTENDED_RELEASE_TABLET | ORAL | Status: AC
  Administered 2023-04-08 (×3): 40 meq via ORAL
  Filled 2023-04-08 (×3): qty 2

## 2023-04-08 NOTE — Progress Notes (Signed)
PROGRESS NOTE    Ryan Rose  OAC:[id number] DOB: [birth year] DOA: 03/21/2023 PCP: Sherron Monday, MD  [phone]  LOS: 18 days   Brief hospital course:   Assessment & Plan: Ryan Rose is a 62 y.o. male with medical history significant of AAA status post repair and stenting, alcohol abuse, CAD, HTN, PVD, presented with persistent nauseous vomiting chest pain abdominal pain.    upper endoscopy done which showed grade D esophagitis, large hiatal hernia and single nonbleeding angiectasia in the stomach treated with argon plasma.    MRCP 8/20--Severe diffuse fatty infiltration of the liver. No focal hepatic lesions are identified without contrast. 2. Distended gallbladder with layering sludge. No definite gallstones. Gallbladder wall thickening and pericholecystic inflammatory changes could suggest acalculus cholecystitis or cholestasis. Normal caliber and course of the common bile duct. 3. Small bilateral pleural effusions with overlying atelectasis. There is also a cystic appearing fluid collection near the distal esophagus on the right side. This was not present on the prior CT scan and could be loculated pleural fluid. 4. Moderate mesenteric edema and small volume ascites.   Severe sepsis (HCC) Acalculus cholecystitis S/p drain  --  Lactic acid 2.5.   --Patient with tachycardia, leukocytosis and fever and hypotension.   --MRCP as above --Gen surgery consulted --8/22-- HIDA scan and abnormal. Discussed with general surgery. Patient is not a surgical candidate. Recommends gallbladder drain placement.  --received 2 days of Unasyn f/b 6 days of cefepime and flagyl.  Transitioned to Augmentin when pt lost IV. Plan: --cont Augmentin --cont drain management   Alcohol withdrawal delirium (HCC) dementia suspect alcohol related --Received 2 days of high-dose thiamine.   --Right now patient does not have capacity make medical decisions.  Reach out to Spouse tammy --  confused at baseline however is redirected well.   Acute blood loss anemia Macrocytic anemia --s/p 2u pRBC --monitor Hgb and transfuse to keep Hgb >7   Acute upper GI bleed EGD on 8/15 shows grade D esophagitis, hiatal hernia and angiectasia which was treated with argon plasma coagulation.  --cont PPI po   Aspiration pneumonia (HCC) --on abx, treated    Hypophosphatemia  Hypomagnesemia Hypokalemia --monitor and supplement PRN   Alcoholic ketoacidosis Resolved   PVD (peripheral vascular disease) (HCC) --hold plavix --cont ASA and statin   Transaminitis Secondary to alcohol abuse.  Alkaline phosphatase 205, total bilirubin of 5.1, AST 117, albumin low at 1.6    Abdominal aortic aneurysm (AAA) (HCC) No endoleak on CTA on admission.   Hyponatremia Resolved  Leukocytosis --persistent.  Procal however decreased from 1.64 to 0.73 with abx.     DVT prophylaxis: Lovenox SQ Code Status: DNR  Family Communication:  Level of care: Med-Surg Dispo:   The patient is from: home Anticipated d/c is to: SNF Anticipated d/c date is: whenever bed available   Subjective and Interval History:  Per RN, pt refused meds this morning, also refused Ensure.  Nursing staff tried to feed pt but he ate very little.    Urine noted to be red today.  UA ordered.   Objective: Vitals:   04/07/23 1949 04/07/23 2102 04/08/23 0325 04/08/23 1510  BP: 104/79  114/89 108/85  Pulse: 99  97 93  Resp: 18  20   Temp: 97.6 F (36.4 C)  97.9 F (36.6 C)   TempSrc:      SpO2: 93% 94% 96% 95%  Weight:      Height:        Intake/Output  Summary (Last 24 hours) at 04/08/2023 1908 Last data filed at 04/08/2023 1800 Gross per 24 hour  Intake 0 ml  Output 570 ml  Net -570 ml   Filed Weights   03/25/23 0959 03/29/23 0623 03/30/23 1855  Weight: 63.2 kg 64.9 kg 65 kg    Examination:   Constitutional: NAD, alert, appeared confused HEENT: conjunctivae and lids normal, EOMI CV: No cyanosis.    RESP: normal respiratory effort, on RA Extremities: No effusions, edema in BLE SKIN: warm, dry   Data Reviewed: I have personally reviewed labs and imaging studies  Time spent: 35 minutes  Darlin Priestly, MD [facility] If 7PM-7AM, please contact night-coverage 04/08/2023, 7:08 PM

## 2023-04-08 NOTE — TOC Progression Note (Signed)
Transition of Care [facility]) - Progression Note    Patient Details  Name: Ryan Rose MRN: [id number] Date of Birth: Aug 25, 1960  [facility]) CM/SW Contact  Chapman Fitch, RN Phone Number: 04/08/2023, 8:55 AM  Clinical Narrative:    Insurance auth pending in availity portal         Expected Discharge Plan and Services                                               Social Determinants of Health (SDOH) Interventions SDOH Screenings   Food Insecurity: Patient Declined (03/21/2023)  Housing: Patient Declined (03/21/2023)  Transportation Needs: No Transportation Needs (03/21/2023)  Utilities: Not At Risk (03/21/2023)  Tobacco Use: High Risk (04/01/2023)    Readmission Risk Interventions     No data to display

## 2023-04-08 NOTE — Progress Notes (Signed)
Refused vitals, MD aware

## 2023-04-08 NOTE — Progress Notes (Signed)
Physical Therapy Treatment Patient Details Name: Ryan Rose MRN: [id number] DOB: Mar 26, 1961 Today's Date: 04/08/2023   History of Present Illness Pt is a 62 y/o M admitted on 03/21/23 after presenting with c/c of N&V, chest & abdominal pain. Pt is being treated for alcohol withdrawal delirium, ABLA, acute upper GI bleed. PMH: AAA s/p repair & stenting, alcohol abuse, CAD, HTN, PVD    PT Comments  Pt resting in bed upon PT arrival; pt agreeable to therapy with encouragement.  During session pt 1-2 assist with bed mobility; steady static sitting on EOB; and 2 assist to attempt standing (x2 trials standing pt able to come to 1/2 stand; on 3rd trial standing pt able to come to full stand but pt's B LE's were pushing against bed for stability).  Pt appearing impulsive and requiring redirection intermittently for safety.  Will continue to focus on progressive functional mobility as able during hospitalization.   If plan is discharge home, recommend the following: Two people to help with bathing/dressing/bathroom;Two people to help with walking and/or transfers;Help with stairs or ramp for entrance;Assist for transportation;Assistance with cooking/housework;Assistance with feeding;Direct supervision/assist for financial management;Direct supervision/assist for medications management;Supervision due to cognitive status   Can travel by private vehicle     No  Equipment Recommendations  Other (comment) (TBD at next facility)    Recommendations for Other Services       Precautions / Restrictions Precautions Precautions: Fall Restrictions Weight Bearing Restrictions: No     Mobility  Bed Mobility Overal bed mobility: Needs Assistance Bed Mobility: Supine to Sit, Sit to Supine Rolling: Max assist  Supine to sit: max assist   Sit to supine: +2 for physical assistance, +2 for safety/equipment   General bed mobility comments: assist for trunk and B LE's; vc's for technique     Transfers Overall transfer level: Needs assistance Equipment used: Rolling walker (2 wheels) Transfers: Sit to/from Stand Sit to Stand: Max assist, +2 physical assistance           General transfer comment: x2 trials standing pt able to come to 1/2 stand; on 3rd trial standing pt able to come to full stand (but B LE's pushing against bed for stability); vc's for technique and safety    Ambulation/Gait               General Gait Details: pt unable to take steps with max cueing and 2 assist and RW use   Stairs             Wheelchair Mobility     Tilt Bed    Modified Rankin (Stroke Patients Only)       Balance Overall balance assessment: Needs assistance Sitting-balance support: No upper extremity supported, Feet supported Sitting balance-Leahy Scale: Fair Sitting balance - Comments: steady static sitting   Standing balance support: Bilateral upper extremity supported, Reliant on assistive device for balance Standing balance-Leahy Scale: Poor Standing balance comment: pt pushing B LE's against bed to stabilize in standing (plus 2 assist with RW use)                            Cognition Arousal: Alert Behavior During Therapy: Anxious, Impulsive Overall Cognitive Status: History of cognitive impairments - at baseline Area of Impairment: Orientation, Attention, Memory, Following commands, Safety/judgement, Problem solving, Awareness                 Orientation Level: Disoriented to, Place, Time, Situation Current Attention  Level: Focused Memory: Decreased recall of precautions, Decreased short-term memory Following Commands: Follows one step commands inconsistently, Follows one step commands with increased time Safety/Judgement: Decreased awareness of safety, Decreased awareness of deficits   Problem Solving: Decreased initiation, Difficulty sequencing, Requires verbal cues, Requires tactile cues, Slow processing           Exercises      General Comments General comments (skin integrity, edema, etc.): Pt incontinent of BM (NT came and cleaned pt up while pt was standing).  Nursing cleared pt for participation in physical therapy.      Pertinent Vitals/Pain Pain Assessment Pain Assessment: Faces Faces Pain Scale: No hurt Pain Intervention(s): Limited activity within patient's tolerance, Monitored during session, Repositioned Vitals (HR and SpO2 on room air) stable and WFL throughout treatment session.    Home Living                          Prior Function            PT Goals (current goals can now be found in the care plan section) Acute Rehab PT Goals Patient Stated Goal: none stated PT Goal Formulation: Patient unable to participate in goal setting Time For Goal Achievement: 04/12/23 Potential to Achieve Goals: Fair Progress towards PT goals: Progressing toward goals    Frequency    Min 1X/week      PT Plan      Co-evaluation              AM-PAC PT "6 Clicks" Mobility   Outcome Measure  Help needed turning from your back to your side while in a flat bed without using bedrails?: A Lot Help needed moving from lying on your back to sitting on the side of a flat bed without using bedrails?: A Lot Help needed moving to and from a bed to a chair (including a wheelchair)?: Total Help needed standing up from a chair using your arms (e.g., wheelchair or bedside chair)?: Total Help needed to walk in hospital room?: Total Help needed climbing 3-5 steps with a railing? : Total 6 Click Score: 8    End of Session   Activity Tolerance: Patient tolerated treatment well Patient left: in bed;with call bell/phone within reach;with bed alarm set;Other (comment) (fall mat in place) Nurse Communication: Mobility status;Precautions PT Visit Diagnosis: Muscle weakness (generalized) (M62.81);Difficulty in walking, not elsewhere classified (R26.2);Other abnormalities of gait and  mobility (R26.89)     Time: 2841-3244 PT Time Calculation (min) (ACUTE ONLY): 18 min  Charges:    $Therapeutic Activity: 8-22 mins PT General Charges $$ ACUTE PT VISIT: 1 Visit                     Hendricks Limes, PT 04/08/23, 4:24 PM

## 2023-04-08 NOTE — TOC Progression Note (Signed)
Transition of Care [facility]) - Progression Note    Patient Details  Name: Ryan Rose MRN: [id number] Date of Birth: 22-Jan-1961  [facility]) CM/SW Contact  Chapman Fitch, RN Phone Number: 04/08/2023, 11:53 AM  Clinical Narrative:     Insurance has denied STR If patient/family wants to do an expedited appeal they can call [phone]  Patient A&O x1 Call placed to wife (separated) [name].  [name] confirms that patients benefits ends 8/31, notified tammy that insurance has denied STR stay.  She declines appealing as his insurance expires on 8/31  [name] states that in his current condition patient will not be able to return home, as he lives home alone, is A&O x1, and is max assist for bed mobility   [name] states that she has applied for Medicaid and is to follow up with medicaid worker to see if it can be expedited and request a medicaid pending number     Per Keri with financial "  I spoke to his wife earlier this week. She confirmed that she went to the county office and applied for him. I have been out dealing with health issues, and continue to be out here and there trying to get this under control. I will confirm with the county as well that there is a pending app, and let you know for sure"    Expected Discharge Plan and Services                                               Social Determinants of Health (SDOH) Interventions SDOH Screenings   Food Insecurity: Patient Declined (03/21/2023)  Housing: Patient Declined (03/21/2023)  Transportation Needs: No Transportation Needs (03/21/2023)  Utilities: Not At Risk (03/21/2023)  Tobacco Use: High Risk (04/01/2023)    Readmission Risk Interventions     No data to display

## 2023-04-09 DIAGNOSIS — A419 Sepsis, unspecified organism: Secondary | ICD-10-CM | POA: Diagnosis not present

## 2023-04-09 DIAGNOSIS — R6520 Severe sepsis without septic shock: Secondary | ICD-10-CM | POA: Diagnosis not present

## 2023-04-09 LAB — CBC
HCT: 33.3 % — ABNORMAL LOW (ref 39.0–52.0)
Hemoglobin: 11.3 g/dL — ABNORMAL LOW (ref 13.0–17.0)
MCH: 32.2 pg (ref 26.0–34.0)
MCHC: 33.9 g/dL (ref 30.0–36.0)
MCV: 94.9 fL (ref 80.0–100.0)
Platelets: 258 10*3/uL (ref 150–400)
RBC: 3.51 MIL/uL — ABNORMAL LOW (ref 4.22–5.81)
RDW: 20 % — ABNORMAL HIGH (ref 11.5–15.5)
WBC: 15.2 10*3/uL — ABNORMAL HIGH (ref 4.0–10.5)
nRBC: 0 % (ref 0.0–0.2)

## 2023-04-09 LAB — BASIC METABOLIC PANEL
Anion gap: 13 (ref 5–15)
BUN: 7 mg/dL — ABNORMAL LOW (ref 8–23)
CO2: 23 mmol/L (ref 22–32)
Calcium: 8.2 mg/dL — ABNORMAL LOW (ref 8.9–10.3)
Chloride: 109 mmol/L (ref 98–111)
Creatinine, Ser: 0.5 mg/dL — ABNORMAL LOW (ref 0.61–1.24)
GFR, Estimated: >60 mL/min (ref >60)
Glucose, Bld: 112 mg/dL — ABNORMAL HIGH (ref 70–99)
Potassium: 3.2 mmol/L — ABNORMAL LOW (ref 3.5–5.1)
Sodium: 145 mmol/L (ref 135–145)

## 2023-04-09 LAB — MAGNESIUM: Magnesium: 1.9 mg/dL (ref 1.7–2.4)

## 2023-04-09 MED ORDER — POTASSIUM CHLORIDE CRYS ER 20 MEQ PO TBCR
40.0000 meq | EXTENDED_RELEASE_TABLET | ORAL | Status: AC
  Administered 2023-04-09: 40 meq via ORAL
  Filled 2023-04-09: qty 2

## 2023-04-09 MED ORDER — DRONABINOL 2.5 MG PO CAPS
5.0000 mg | ORAL_CAPSULE | Freq: Every day | ORAL | Status: DC
  Administered 2023-04-09 – 2023-04-17 (×5): 5 mg via ORAL
  Filled 2023-04-09 (×5): qty 2

## 2023-04-09 NOTE — Progress Notes (Signed)
PROGRESS NOTE    Ryan Rose  YNW:[id number] DOB: Sep 22, 1960 DOA: 03/21/2023 PCP: Sherron Monday, MD  [phone]  LOS: 19 days   Brief hospital course:   Assessment & Plan: Ryan Rose is a 62 y.o. male with medical history significant of AAA status post repair and stenting, alcohol abuse, CAD, HTN, PVD, presented with persistent nauseous vomiting chest pain abdominal pain.    upper endoscopy done which showed grade D esophagitis, large hiatal hernia and single nonbleeding angiectasia in the stomach treated with argon plasma.    MRCP 8/20--Severe diffuse fatty infiltration of the liver. No focal hepatic lesions are identified without contrast. 2. Distended gallbladder with layering sludge. No definite gallstones. Gallbladder wall thickening and pericholecystic inflammatory changes could suggest acalculus cholecystitis or cholestasis. Normal caliber and course of the common bile duct. 3. Small bilateral pleural effusions with overlying atelectasis. There is also a cystic appearing fluid collection near the distal esophagus on the right side. This was not present on the prior CT scan and could be loculated pleural fluid. 4. Moderate mesenteric edema and small volume ascites.   Severe sepsis (HCC) Acalculus cholecystitis S/p drain  --  Lactic acid 2.5.   --Patient with tachycardia, leukocytosis and fever and hypotension.   --MRCP as above --Gen surgery consulted --8/22-- HIDA scan and abnormal. Discussed with general surgery. Patient is not a surgical candidate. Recommends gallbladder drain placement.  --received 2 days of Unasyn f/b 6 days of cefepime and flagyl.  Transitioned to Augmentin when pt lost IV. Plan: --cont Augmentin, will continue 10-day course --cont drain management   Alcohol withdrawal delirium (HCC) dementia suspect alcohol related --Received 2 days of high-dose thiamine.   --Right now patient does not have capacity make medical decisions.   Reach out to Spouse Ryan Rose -- confused at baseline however is redirected well.   Acute blood loss anemia Macrocytic anemia --s/p 2u pRBC --monitor Hgb and transfuse to keep Hgb >7   Acute upper GI bleed EGD on 8/15 shows grade D esophagitis, hiatal hernia and angiectasia which was treated with argon plasma coagulation.  --cont PPI po   Aspiration pneumonia (HCC) --on abx, treated    Hypophosphatemia  Hypomagnesemia Hypokalemia --monitor and supplement PRN   Alcoholic ketoacidosis Resolved   PVD (peripheral vascular disease) (HCC) --hold plavix --cont ASA and statin   Transaminitis Secondary to alcohol abuse.  Alkaline phosphatase 205, total bilirubin of 5.1, AST 117, albumin low at 1.6    Abdominal aortic aneurysm (AAA) (HCC) No endoleak on CTA on admission.   Hyponatremia Resolved  Leukocytosis --persistent.  Procal however decreased from 1.64 to 0.73 with abx.   Poor oral intake --refusing all Ensures and eating very little even while staff trying to feed him.  Refusing meds sometimes. --start appetite stimulant Ryan Rose, per sister request    DVT prophylaxis: Lovenox SQ Code Status: DNR  Family Communication: sister updated at bedside today Level of care: Med-Surg Dispo:   The patient is from: home Anticipated d/c is to: SNF Anticipated d/c date is: whenever bed available   Subjective and Interval History:  Continued to refuse all Ensures.  Repeated asking his sister at bedside for his special "tea" (alcoholic beverage).    Objective: Vitals:   04/08/23 2051 04/09/23 0427 04/09/23 0721 04/09/23 0800  BP:  107/66  108/85  Pulse:  (!) 104  97  Resp:  20  (!) 21  Temp:    97.8 F (36.6 C)  TempSrc:  Axillary  SpO2: 93% 94% 94% 97%  Weight:      Height:        Intake/Output Summary (Last 24 hours) at 04/09/2023 1823 Last data filed at 04/09/2023 1340 Gross per 24 hour  Intake --  Output 21 ml  Net -21 ml   Filed Weights   03/25/23 0959  03/29/23 0623 03/30/23 1855  Weight: 63.2 kg 64.9 kg 65 kg    Examination:   Constitutional: NAD, alert, oriented to self only HEENT: conjunctivae and lids normal, EOMI CV: No cyanosis.   RESP: normal respiratory effort, on RA Abdomen: chole drain present with brown/green output Neuro: II - XII grossly intact.     Data Reviewed: I have personally reviewed labs and imaging studies  Time spent: 35 minutes  Darlin Priestly, MD [facility] If 7PM-7AM, please contact night-coverage 04/09/2023, 6:23 PM

## 2023-04-09 NOTE — Progress Notes (Signed)
Nutrition Follow-up  DOCUMENTATION CODES:   Non-severe (moderate) malnutrition in context of chronic illness  INTERVENTION:   -Continue MVI with minerals daily -Continue Ensure Enlive po TID, each supplement provides 350 kcal and 20 grams of protein -Continue dysphagia 2 diet -Continue feeding assistance with meals   NUTRITION DIAGNOSIS:   Moderate Malnutrition related to chronic illness (ETOH abuse) as evidenced by percent weight loss, mild fat depletion, moderate fat depletion, mild muscle depletion, moderate muscle depletion.  Ongoing  GOAL:   Patient will meet greater than or equal to 90% of their needs  Unmet  MONITOR:   PO intake, Supplement acceptance  REASON FOR ASSESSMENT:   Consult Assessment of nutrition requirement/status  ASSESSMENT:   Pt with medical history significant of AAA status post repair and stenting, alcohol abuse, CAD, HTN, PVD, presented with persistent nauseous vomiting chest pain abdominal pain.  8/23- s/p Placement of 10 Fr perc chole tube, placed to bag drain    Reviewed I/O's: -590 ml x 24 hours and +4.9 L since 03/26/23  UOP: 500 ml x 24 hours  Drains: 90 ml x 24 hours   Pt continues to refuse most care, but will sometimes take medications. Intake remains very poor and pt refusing supplements. Noted meal completions 0%. Pt continues to request alcohol.   Per palliative care notes, pt does not want a feeding tube.   Discussed with MD; marinol added per request of family.   Per TOC notes, pt awaiting medicaid application for SNF placement.   Medications reviewed and include folic acid, melatonin, senna, and potassium chloride.   Labs reviewed: K: 3.2.    Diet Order:   Diet Order             DIET DYS 2 Room service appropriate? Yes; Fluid consistency: Thin  Diet effective now                   EDUCATION NEEDS:   No education needs have been identified at this time  Skin:  Skin Assessment: Skin Integrity  Issues: Skin Integrity Issues:: Other (Comment) Other: abrasions to bilateral elbows  Last BM:  04/05/23 (type 7)  Height:   Ht Readings from Last 1 Encounters:  03/30/23 5\' 9"  (1.753 m)    Weight:   Wt Readings from Last 1 Encounters:  03/30/23 65 kg    Ideal Body Weight:  72.7 kg  BMI:  Body mass index is 21.16 kg/m.  Estimated Nutritional Needs:   Kcal:  2100-2300  Protein:  100-115 grams  Fluid:  > 2 L    Levada Schilling, RD, LDN, CDCES Registered Dietitian II Certified Diabetes Care and Education Specialist Please refer to [facility] for RD and/or RD on-call/weekend/after hours pager

## 2023-04-09 NOTE — Plan of Care (Signed)

## 2023-04-09 NOTE — TOC Progression Note (Signed)
Transition of Care [facility]) - Progression Note    Patient Details  Name: Ryan Rose MRN: [id number] Date of Birth: 08-18-1960  [facility]) CM/SW Contact  Chapman Fitch, RN Phone Number: 04/09/2023, 10:06 AM  Clinical Narrative:     Once provided Medicaid pending number from wife or financial counseling will started Bed search for medicaid pending bed  Wife is aware that we will likely not receive any bed offers with medicaid pending. I inquired if she thought it would be an option for her to private pay for a month.  She did not provide and answer at that time        Expected Discharge Plan and Services                                               Social Determinants of Health (SDOH) Interventions SDOH Screenings   Food Insecurity: Patient Declined (03/21/2023)  Housing: Patient Declined (03/21/2023)  Transportation Needs: No Transportation Needs (03/21/2023)  Utilities: Not At Risk (03/21/2023)  Tobacco Use: High Risk (04/01/2023)    Readmission Risk Interventions     No data to display

## 2023-04-09 NOTE — Plan of Care (Signed)
  Problem: Health Behavior/Discharge Planning: Goal: Ability to manage health-related needs will improve Outcome: Progressing   Problem: Pain Managment: Goal: General experience of comfort will improve Outcome: Progressing   Problem: Clinical Measurements: Goal: Diagnostic test results will improve Outcome: Progressing   Problem: Respiratory: Goal: Ability to maintain adequate ventilation will improve Outcome: Progressing

## 2023-04-10 DIAGNOSIS — R6520 Severe sepsis without septic shock: Secondary | ICD-10-CM | POA: Diagnosis not present

## 2023-04-10 DIAGNOSIS — A419 Sepsis, unspecified organism: Secondary | ICD-10-CM | POA: Diagnosis not present

## 2023-04-10 MED ORDER — POTASSIUM CHLORIDE CRYS ER 20 MEQ PO TBCR
40.0000 meq | EXTENDED_RELEASE_TABLET | Freq: Once | ORAL | Status: AC
  Administered 2023-04-10: 40 meq via ORAL
  Filled 2023-04-10: qty 2

## 2023-04-10 NOTE — Plan of Care (Signed)

## 2023-04-10 NOTE — Progress Notes (Signed)
PROGRESS NOTE    Ryan Rose  [id number]:[id number] DOB: [birth year] DOA: 03/21/2023 PCP: Sherron Monday, MD  [phone]  LOS: 20 days   Brief hospital course:   Assessment & Plan: HAWKE UELAND is a 62 y.o. male with medical history significant of AAA status post repair and stenting, alcohol abuse, CAD, HTN, PVD, presented with persistent nauseous vomiting chest pain abdominal pain.    upper endoscopy done which showed grade D esophagitis, large hiatal hernia and single nonbleeding angiectasia in the stomach treated with argon plasma.    MRCP 8/20--Severe diffuse fatty infiltration of the liver. No focal hepatic lesions are identified without contrast. 2. Distended gallbladder with layering sludge. No definite gallstones. Gallbladder wall thickening and pericholecystic inflammatory changes could suggest acalculus cholecystitis or cholestasis. Normal caliber and course of the common bile duct. 3. Small bilateral pleural effusions with overlying atelectasis. There is also a cystic appearing fluid collection near the distal esophagus on the right side. This was not present on the prior CT scan and could be loculated pleural fluid. 4. Moderate mesenteric edema and small volume ascites.   Severe sepsis (HCC) Acalculus cholecystitis S/p drain  --  Lactic acid 2.5.   --Patient with tachycardia, leukocytosis and fever and hypotension.   --MRCP as above --Gen surgery consulted --8/22-- HIDA scan and abnormal. Discussed with general surgery. Patient is not a surgical candidate. Recommends gallbladder drain placement.  --received 2 days of Unasyn f/b 6 days of cefepime and flagyl.  Transitioned to Augmentin when pt lost IV.  Pt completed 10-day course of abx. Plan: --cont drain management   Alcohol withdrawal delirium (HCC) dementia suspect alcohol related --Received 2 days of high-dose thiamine.   --Right now patient does not have capacity make medical decisions.  Reach out  to Spouse tammy -- confused at baseline however is redirected well.   Acute blood loss anemia Macrocytic anemia --s/p 2u pRBC --monitor Hgb and transfuse to keep Hgb >7   Acute upper GI bleed EGD on 8/15 shows grade D esophagitis, hiatal hernia and angiectasia which was treated with argon plasma coagulation.  --cont PPI po   Aspiration pneumonia (HCC) --on abx, treated    Hypophosphatemia  Hypomagnesemia Hypokalemia --monitor and supplement PRN   Alcoholic ketoacidosis Resolved   PVD (peripheral vascular disease) (HCC) --hold plavix --cont ASA and statin   Transaminitis Secondary to alcohol abuse.  Alkaline phosphatase 205, total bilirubin of 5.1, AST 117, albumin low at 1.6    Abdominal aortic aneurysm (AAA) (HCC) No endoleak on CTA on admission.   Hyponatremia Resolved  Leukocytosis --persistent.  Procal however decreased from 1.64 to 0.73 with abx.   Poor oral intake --refusing all Ensures and eating very little even while staff trying to feed him.  Refusing meds sometimes. --started appetite stimulant Marinol, per sister request --cont Marinol  Acute urinary retention --I/O cath removed 600 ml urine. --bladder scan q6h, if post-void >500 ml, then insert Foley.    DVT prophylaxis: Lovenox SQ Code Status: DNR  Family Communication:  Level of care: Med-Surg Dispo:   The patient is from: home Anticipated d/c is to: SNF Anticipated d/c date is: whenever bed available   Subjective and Interval History:  Overnight, pt had urinary retention, I/O cath removed ~600 ml.   Objective: Vitals:   04/10/23 0959 04/10/23 1045 04/10/23 1500 04/10/23 1641  BP: 101/77   100/77  Pulse: (!) 101   (!) 102  Resp: (!) 30 (!) 26 18 (!) 21  Temp: 98  F (36.7 C)   (!) 97.5 F (36.4 C)  TempSrc:      SpO2: 94%   95%  Weight:      Height:        Intake/Output Summary (Last 24 hours) at 04/10/2023 1908 Last data filed at 04/10/2023 1655 Gross per 24 hour  Intake 0  ml  Output 750 ml  Net -750 ml   Filed Weights   03/25/23 0959 03/29/23 0623 03/30/23 1855  Weight: 63.2 kg 64.9 kg 65 kg    Examination:   Constitutional: NAD CV: No cyanosis.   RESP: normal respiratory effort, on RA SKIN: warm, dry   Data Reviewed: I have personally reviewed labs and imaging studies  Time spent: 35 minutes  Darlin Priestly, MD [facility] If 7PM-7AM, please contact night-coverage 04/10/2023, 7:08 PM

## 2023-04-11 DIAGNOSIS — A419 Sepsis, unspecified organism: Secondary | ICD-10-CM | POA: Diagnosis not present

## 2023-04-11 DIAGNOSIS — R6520 Severe sepsis without septic shock: Secondary | ICD-10-CM | POA: Diagnosis not present

## 2023-04-11 MED ORDER — TAMSULOSIN HCL 0.4 MG PO CAPS
0.4000 mg | ORAL_CAPSULE | Freq: Every day | ORAL | Status: DC
  Administered 2023-04-12 – 2023-04-17 (×4): 0.4 mg via ORAL
  Filled 2023-04-11 (×6): qty 1

## 2023-04-11 NOTE — Plan of Care (Signed)

## 2023-04-11 NOTE — Progress Notes (Signed)
Patient has refused medications today, advised and attempted to educate patience on importance of medications but due to confusion at baseline he was not able to fully comprehend, MD notified.

## 2023-04-11 NOTE — Progress Notes (Signed)
PROGRESS NOTE    VANCE RASBERRY  CBJ:[id number] DOB: 09/16/1960 DOA: 03/21/2023 PCP: Sherron Monday, MD  [phone]  LOS: 21 days   Brief hospital course:   Assessment & Plan: AADIL KOHRING is a 62 y.o. male with medical history significant of AAA status post repair and stenting, alcohol abuse, CAD, HTN, PVD, presented with persistent nauseous vomiting chest pain abdominal pain.    upper endoscopy done which showed grade D esophagitis, large hiatal hernia and single nonbleeding angiectasia in the stomach treated with argon plasma.    MRCP 8/20--Severe diffuse fatty infiltration of the liver. No focal hepatic lesions are identified without contrast. 2. Distended gallbladder with layering sludge. No definite gallstones. Gallbladder wall thickening and pericholecystic inflammatory changes could suggest acalculus cholecystitis or cholestasis. Normal caliber and course of the common bile duct. 3. Small bilateral pleural effusions with overlying atelectasis. There is also a cystic appearing fluid collection near the distal esophagus on the right side. This was not present on the prior CT scan and could be loculated pleural fluid. 4. Moderate mesenteric edema and small volume ascites.   Severe sepsis (HCC) Acalculus cholecystitis S/p drain  --Lactic acid 2.5.   --Patient with tachycardia, leukocytosis and fever and hypotension.   --MRCP as above --Gen surgery consulted --8/22-- HIDA scan and abnormal. Discussed with general surgery. Patient is not a surgical candidate. Recommends gallbladder drain placement.  --received 2 days of Unasyn f/b 6 days of cefepime and flagyl.  Transitioned to Augmentin when pt lost IV.  Pt completed 10-day course of abx. Plan: --cont drain management   Alcohol withdrawal delirium (HCC) dementia suspect alcohol related --Received 2 days of high-dose thiamine.   --Right now patient does not have capacity make medical decisions.  Reach out to  Spouse tammy -- confused at baseline however is redirected well.   Acute blood loss anemia Macrocytic anemia --s/p 2u pRBC --monitor Hgb and transfuse to keep Hgb >7   Acute upper GI bleed EGD on 8/15 shows grade D esophagitis, hiatal hernia and angiectasia which was treated with argon plasma coagulation.  --cont PPI po   Aspiration pneumonia (HCC) --on abx, treated    Hypophosphatemia  Hypomagnesemia Hypokalemia --monitor and supplement PRN   Alcoholic ketoacidosis Resolved   PVD (peripheral vascular disease) (HCC) --hold plavix --cont ASA and statin   Transaminitis Secondary to alcohol abuse.  Alkaline phosphatase 205, total bilirubin of 5.1, AST 117, albumin low at 1.6    Abdominal aortic aneurysm (AAA) (HCC) No endoleak on CTA on admission.   Hyponatremia Resolved  Leukocytosis --persistent.  Procal however decreased from 1.64 to 0.73 with abx.   Poor oral intake --refusing all Ensures and eating very little even while staff trying to feed him.  Refusing meds sometimes. --started appetite stimulant Marinol, per sister request --cont Marinol  Acute urinary retention --I/O cath removed 600 ml urine. --bladder scan q6h, if post-void >500 ml, then insert Foley. --start Flomax  Current smoker --pt persistently asked to go smoke --Nicotine patch    DVT prophylaxis: Lovenox SQ Code Status: DNR  Family Communication:  Level of care: Med-Surg Dispo:   The patient is from: home Anticipated d/c is to: SNF Anticipated d/c date is: whenever bed available   Subjective and Interval History:  Per nursing tech, pt has been having diarrhea.  Pt again refused oral meds today.   Objective: Vitals:   04/10/23 1500 04/10/23 1641 04/11/23 0522 04/11/23 1557  BP:  100/77 107/77 106/82  Pulse:  Marland Kitchen)  102 99 98  Resp: 18 (!) 21 18 18   Temp:  (!) 97.5 F (36.4 C) 98 F (36.7 C) 98 F (36.7 C)  TempSrc:      SpO2:  95% 94% 99%  Weight:      Height:         Intake/Output Summary (Last 24 hours) at 04/11/2023 1719 Last data filed at 04/11/2023 1057 Gross per 24 hour  Intake 0 ml  Output --  Net 0 ml   Filed Weights   03/25/23 0959 03/29/23 0623 03/30/23 1855  Weight: 63.2 kg 64.9 kg 65 kg    Examination:   Constitutional: NAD, alert, not oriented HEENT: conjunctivae and lids normal, EOMI CV: No cyanosis.   RESP: normal respiratory effort, on RA SKIN: warm, dry Neuro: II - XII grossly intact.     Data Reviewed: I have personally reviewed labs and imaging studies  Time spent: 35 minutes  Darlin Priestly, MD [facility] If 7PM-7AM, please contact night-coverage 04/11/2023, 5:19 PM

## 2023-04-12 DIAGNOSIS — R6520 Severe sepsis without septic shock: Secondary | ICD-10-CM | POA: Diagnosis not present

## 2023-04-12 DIAGNOSIS — A419 Sepsis, unspecified organism: Secondary | ICD-10-CM | POA: Diagnosis not present

## 2023-04-12 LAB — CBC
HCT: 29.3 % — ABNORMAL LOW (ref 39.0–52.0)
Hemoglobin: 9.7 g/dL — ABNORMAL LOW (ref 13.0–17.0)
MCH: 31.6 pg (ref 26.0–34.0)
MCHC: 33.1 g/dL (ref 30.0–36.0)
MCV: 95.4 fL (ref 80.0–100.0)
Platelets: 298 10*3/uL (ref 150–400)
RBC: 3.07 MIL/uL — ABNORMAL LOW (ref 4.22–5.81)
RDW: 21.1 % — ABNORMAL HIGH (ref 11.5–15.5)
WBC: 14.7 10*3/uL — ABNORMAL HIGH (ref 4.0–10.5)
nRBC: 0 % (ref 0.0–0.2)

## 2023-04-12 LAB — URINALYSIS, COMPLETE (UACMP) WITH MICROSCOPIC
Glucose, UA: NEGATIVE mg/dL
Leukocytes,Ua: NEGATIVE
Nitrite: POSITIVE — AB
Protein, ur: 30 mg/dL — AB
Specific Gravity, Urine: 1.025 (ref 1.005–1.030)
pH: 5 (ref 5.0–8.0)

## 2023-04-12 LAB — BASIC METABOLIC PANEL
Anion gap: 9 (ref 5–15)
BUN: 7 mg/dL — ABNORMAL LOW (ref 8–23)
CO2: 22 mmol/L (ref 22–32)
Calcium: 7.4 mg/dL — ABNORMAL LOW (ref 8.9–10.3)
Chloride: 112 mmol/L — ABNORMAL HIGH (ref 98–111)
Creatinine, Ser: 0.41 mg/dL — ABNORMAL LOW (ref 0.61–1.24)
GFR, Estimated: >60 mL/min (ref >60)
Glucose, Bld: 84 mg/dL (ref 70–99)
Potassium: 2.9 mmol/L — ABNORMAL LOW (ref 3.5–5.1)
Sodium: 143 mmol/L (ref 135–145)

## 2023-04-12 LAB — MAGNESIUM: Magnesium: 1.5 mg/dL — ABNORMAL LOW (ref 1.7–2.4)

## 2023-04-12 MED ORDER — POTASSIUM CHLORIDE CRYS ER 20 MEQ PO TBCR
40.0000 meq | EXTENDED_RELEASE_TABLET | ORAL | Status: AC
  Administered 2023-04-12 (×2): 40 meq via ORAL
  Filled 2023-04-12 (×2): qty 2

## 2023-04-12 MED ORDER — MAGNESIUM OXIDE -MG SUPPLEMENT 400 (240 MG) MG PO TABS
400.0000 mg | ORAL_TABLET | Freq: Two times a day (BID) | ORAL | Status: DC
  Administered 2023-04-12 – 2023-04-17 (×6): 400 mg via ORAL
  Filled 2023-04-12 (×9): qty 1

## 2023-04-12 NOTE — TOC Progression Note (Signed)
Transition of Care [facility]) - Progression Note    Patient Details  Name: Ryan Rose MRN: [id number] Date of Birth: 1961-07-20  [facility] CM/SW Contact  Darolyn Rua, [place] Phone Number: 04/12/2023, 9:45 AM  Clinical Narrative:     9/2; CSW spoke with patient's wife who provided meciaid pending number of [phone] [phone]  CSW has re sent referrals for bed offers with this information included.     8/30: Once provided Medicaid pending number from wife or financial counseling will started Bed search for medicaid pending bed   Wife is aware that we will likely not receive any bed offers with medicaid pending. I inquired if she thought it would be an option for her to private pay for a month.  She did not provide and answer at that time       Expected Discharge Plan and Services                                               Social Determinants of Health (SDOH) Interventions SDOH Screenings   Food Insecurity: Patient Declined (03/21/2023)  Housing: Patient Declined (03/21/2023)  Transportation Needs: No Transportation Needs (03/21/2023)  Utilities: Not At Risk (03/21/2023)  Tobacco Use: High Risk (04/01/2023)    Readmission Risk Interventions     No data to display

## 2023-04-12 NOTE — Progress Notes (Addendum)
Pt with poor intake, not voiding on his own, bladder scan this evening 220 mls, MD Fran Lowes made aware.  1610 Per MD Fran Lowes, if next bladder scan >500 mls insert Foley cath.

## 2023-04-12 NOTE — Progress Notes (Signed)
PROGRESS NOTE    Ryan Rose  WJX:[id number] DOB: 1961-04-06 DOA: 03/21/2023 PCP: Sherron Monday, MD  [phone]  LOS: 22 days   Brief hospital course:   Assessment & Plan: Ryan Rose is a 62 y.o. male with medical history significant of AAA status post repair and stenting, alcohol abuse, CAD, HTN, PVD, presented with persistent nauseous vomiting chest pain abdominal pain.    upper endoscopy done which showed grade D esophagitis, large hiatal hernia and single nonbleeding angiectasia in the stomach treated with argon plasma.    MRCP 8/20--Severe diffuse fatty infiltration of the liver. No focal hepatic lesions are identified without contrast. 2. Distended gallbladder with layering sludge. No definite gallstones. Gallbladder wall thickening and pericholecystic inflammatory changes could suggest acalculus cholecystitis or cholestasis. Normal caliber and course of the common bile duct. 3. Small bilateral pleural effusions with overlying atelectasis. There is also a cystic appearing fluid collection near the distal esophagus on the right side. This was not present on the prior CT scan and could be loculated pleural fluid. 4. Moderate mesenteric edema and small volume ascites.   Severe sepsis (HCC) Acalculus cholecystitis S/p drain  --Lactic acid 2.5.   --Patient with tachycardia, leukocytosis and fever and hypotension.   --MRCP as above --Gen surgery consulted --8/22-- HIDA scan and abnormal. Discussed with general surgery. Patient is not a surgical candidate. Recommends gallbladder drain placement.  --received 2 days of Unasyn f/b 6 days of cefepime and flagyl.  Transitioned to Augmentin when pt lost IV.  Pt completed 10-day course of abx. Plan: --cont drain management   Alcohol withdrawal delirium (HCC) dementia suspect alcohol related --Received 2 days of high-dose thiamine.   --Right now patient does not have capacity make medical decisions.  Reach out to  Spouse tammy -- confused at baseline however is redirected well.   Acute blood loss anemia Macrocytic anemia --s/p 2u pRBC --monitor Hgb and transfuse to keep Hgb >7   Acute upper GI bleed EGD on 8/15 shows grade D esophagitis, hiatal hernia and angiectasia which was treated with argon plasma coagulation.  --cont PPI   Aspiration pneumonia (HCC) --on abx, treated    Hypophosphatemia  Hypomagnesemia Hypokalemia --monitor and supplement PRN   Alcoholic ketoacidosis Resolved   PVD (peripheral vascular disease) (HCC) --hold plavix --cont ASA and statin   Transaminitis Secondary to alcohol abuse.  Alkaline phosphatase 205, total bilirubin of 5.1, AST 117, albumin low at 1.6    Abdominal aortic aneurysm (AAA) (HCC) No endoleak on CTA on admission.   Hyponatremia Resolved  Leukocytosis --persistent.  Procal however decreased from 1.64 to 0.73 with abx.   Poor oral intake --refusing all Ensures and eating very little even while staff trying to feed him.  Refusing meds sometimes. --started appetite stimulant Marinol, per sister request --cont Marinol  Acute urinary retention --I/O cath removed 600 ml urine, x2. --bladder scan q6h, if post-void >500 ml, then insert Foley. --cont Flomax (new) --UA and urine culture  Current smoker --pt persistently asked to go smoke --Nicotine patch    DVT prophylaxis: Lovenox SQ Code Status: DNR  Family Communication:  Level of care: Med-Surg Dispo:   The patient is from: home Anticipated d/c is to: SNF Anticipated d/c date is: whenever bed available   Subjective and Interval History:  Per RN, pt had another I/O with >600 ml urine out.  Still having poor oral intake.    Diarrhea due to scheduled senna, senna d/c'ed.   Objective: Vitals:   04/11/23  1938 04/12/23 0337 04/12/23 0726 04/12/23 1531  BP: 104/80 119/81 115/89 94/69  Pulse: 94 98 (!) 102 90  Resp: 18  18 18   Temp: 98.6 F (37 C) 98.4 F (36.9 C) 98 F  (36.7 C) 98 F (36.7 C)  TempSrc:  Oral    SpO2: 94% 97% 100% 97%  Weight:      Height:        Intake/Output Summary (Last 24 hours) at 04/12/2023 1835 Last data filed at 04/12/2023 1500 Gross per 24 hour  Intake 235 ml  Output 725 ml  Net -490 ml   Filed Weights   03/25/23 0959 03/29/23 0623 03/30/23 1855  Weight: 63.2 kg 64.9 kg 65 kg    Examination:   Constitutional: NAD, alert, not oriented HEENT: conjunctivae and lids normal, EOMI CV: No cyanosis.   RESP: normal respiratory effort, on RA Neuro: II - XII grossly intact.     Data Reviewed: I have personally reviewed labs and imaging studies  Time spent: 35 minutes  Darlin Priestly, MD [facility] If 7PM-7AM, please contact night-coverage 04/12/2023, 6:35 PM

## 2023-04-13 DIAGNOSIS — R6520 Severe sepsis without septic shock: Secondary | ICD-10-CM | POA: Diagnosis not present

## 2023-04-13 DIAGNOSIS — A419 Sepsis, unspecified organism: Secondary | ICD-10-CM | POA: Diagnosis not present

## 2023-04-13 LAB — URINE CULTURE: Culture: NO GROWTH

## 2023-04-13 LAB — POTASSIUM: Potassium: 3.2 mmol/L — ABNORMAL LOW (ref 3.5–5.1)

## 2023-04-13 MED ORDER — POTASSIUM CHLORIDE 20 MEQ PO PACK
40.0000 meq | PACK | Freq: Once | ORAL | Status: AC
  Administered 2023-04-13: 40 meq via ORAL
  Filled 2023-04-13: qty 2

## 2023-04-13 NOTE — Progress Notes (Signed)
PROGRESS NOTE    Ryan Rose  WUJ:[id number] DOB: 10/28/1960 DOA: 03/21/2023 PCP: Sherron Monday, MD  [phone]  LOS: 23 days   Brief hospital course:   Assessment & Plan: DAYTIN Rose is a 62 y.o. male with medical history significant of AAA status post repair and stenting, alcohol abuse, CAD, HTN, PVD, presented with persistent nauseous vomiting chest pain abdominal pain.    upper endoscopy done which showed grade D esophagitis, large hiatal hernia and single nonbleeding angiectasia in the stomach treated with argon plasma.    MRCP 8/20--Severe diffuse fatty infiltration of the liver. No focal hepatic lesions are identified without contrast. 2. Distended gallbladder with layering sludge. No definite gallstones. Gallbladder wall thickening and pericholecystic inflammatory changes could suggest acalculus cholecystitis or cholestasis. Normal caliber and course of the common bile duct. 3. Small bilateral pleural effusions with overlying atelectasis. There is also a cystic appearing fluid collection near the distal esophagus on the right side. This was not present on the prior CT scan and could be loculated pleural fluid. 4. Moderate mesenteric edema and small volume ascites.   Severe sepsis (HCC) Acalculus cholecystitis S/p drain  --Lactic acid 2.5.   --Patient with tachycardia, leukocytosis and fever and hypotension.   --MRCP as above --Gen surgery consulted --8/22-- HIDA scan and abnormal. Discussed with general surgery. Patient is not a surgical candidate. Recommended gallbladder drain placement.  --received 2 days of Unasyn f/b 6 days of cefepime and flagyl.  Transitioned to Augmentin when pt lost IV.  Pt completed 10-day course of abx. Plan: --cont drain management   Alcohol withdrawal delirium (HCC) dementia suspect alcohol related --Received 2 days of high-dose thiamine.   --Right now patient does not have capacity make medical decisions.  Reached out  to spouse Ryan Rose -- confused at baseline however is redirected well.   Acute blood loss anemia Macrocytic anemia --s/p 2u pRBC --monitor Hgb and transfuse to keep Hgb >7   Acute upper GI bleed EGD on 8/15 shows grade D esophagitis, hiatal hernia and angiectasia which was treated with argon plasma coagulation.  --cont PPI   Aspiration pneumonia (HCC) --on abx, treated    Hypophosphatemia  Hypomagnesemia Hypokalemia --monitor and supplement PRN   Alcoholic ketoacidosis Resolved   PVD (peripheral vascular disease) (HCC) --hold plavix --cont ASA and statin   Transaminitis Secondary to alcohol abuse.  Alkaline phosphatase 205, total bilirubin of 5.1, AST 117, albumin low at 1.6    Abdominal aortic aneurysm (AAA) (HCC) No endoleak on CTA on admission.   Hyponatremia Resolved  Leukocytosis --persistent.  Procal however decreased from 1.64 to 0.73 with abx.   Poor oral intake --refusing all Ensures and eating very little even while staff trying to feed him.  Refusing meds sometimes. --started appetite stimulant Marinol, per sister request --pt started asking for food last night. Plan: --cont Marinol --offer finger foods  Acute urinary retention, intermittent --I/O cath removed ~600 ml urine, x2. --cont bladder scan q6h, if post-void >500 ml, then I/O cath.  Try to avoid Foley placement since there is a high risk of pt pulling it out. --cont Flomax (new) --try to position pt upright to help pt void  Current smoker --pt persistently asked to go smoke --Nicotine patch    DVT prophylaxis: Lovenox SQ Code Status: DNR  Family Communication:  Level of care: Med-Surg Dispo:   The patient is from: home Anticipated d/c is to: SNF Anticipated d/c date is: whenever bed available   Subjective and Interval History:  RN reported pt started eating more substantially since last night.  Today during rounds, pt actually asked me for food.    Bladder scan 429 today, however,  when RN tried to insert Foley, pt voided.   Objective: Vitals:   04/12/23 1944 04/13/23 0438 04/13/23 0756 04/13/23 1535  BP: 92/78 (!) 119/91 (!) 107/95 115/88  Pulse: 100 (!) 102 (!) 107 96  Resp: 20 18 20 18   Temp: 98.4 F (36.9 C) 98.1 F (36.7 C) 97.9 F (36.6 C) 98.3 F (36.8 C)  TempSrc:   Oral Oral  SpO2: 100% 92% 96% 97%  Weight:      Height:        Intake/Output Summary (Last 24 hours) at 04/13/2023 2040 Last data filed at 04/13/2023 1800 Gross per 24 hour  Intake 610 ml  Output 599 ml  Net 11 ml   Filed Weights   03/25/23 0959 03/29/23 0623 03/30/23 1855  Weight: 63.2 kg 64.9 kg 65 kg    Examination:   Constitutional: NAD, alert, not oriented HEENT: conjunctivae and lids normal, EOMI CV: No cyanosis.   RESP: normal respiratory effort, on RA Psych: flat mood and affect.     Data Reviewed: I have personally reviewed labs and imaging studies  Time spent: 35 minutes  Darlin Priestly, MD [facility] If 7PM-7AM, please contact night-coverage 04/13/2023, 8:40 PM

## 2023-04-13 NOTE — Progress Notes (Signed)
Bladder scan, 429 mL. Dr Fran Lowes aware.  Cornell Barman Osmara Drummonds

## 2023-04-13 NOTE — Progress Notes (Signed)
Physical Therapy Treatment Patient Details Name: Ryan Rose MRN: [id number] DOB: 19-Mar-1961 Today's Date: 04/13/2023   History of Present Illness Pt is a 62 y/o M admitted on 03/21/23 after presenting with c/c of N&V, chest & abdominal pain. Pt is being treated for alcohol withdrawal delirium, ABLA, acute upper GI bleed. PMH: AAA s/p repair & stenting, alcohol abuse, CAD, HTN, PVD    PT Comments  Pt is steadily progressing towards PT goals, although limited due to state of confusion. Pt is received in bed, he is agreeable to PT following persuasion but refused getting out of bed. Pt reports mild mid-low back discomfort at the beginning of session. Pt performs bed mobility max A and not able to perform transfers during today's visit. Frequent cuing and assistance requiring to roll both sides. Pt able to perform bed exercises to promote BLE strengthening but requires assistance. Pt cont to demonstrate confused state as seen by inability to state place, time, or situation and occasional mumbling of words. Pt would benefit from cont skilled PT to address above deficits and promote optimal return to PLOF.     If plan is discharge home, recommend the following: Two people to help with bathing/dressing/bathroom;Two people to help with walking and/or transfers;Help with stairs or ramp for entrance;Assist for transportation;Assistance with cooking/housework;Assistance with feeding;Direct supervision/assist for financial management;Direct supervision/assist for medications management;Supervision due to cognitive status   Can travel by private vehicle     No  Equipment Recommendations       Recommendations for Other Services       Precautions / Restrictions Precautions Precautions: Fall Restrictions Weight Bearing Restrictions: No     Mobility  Bed Mobility Overal bed mobility: Needs Assistance Bed Mobility: Rolling Rolling: Max assist         General bed mobility comments: assist  with hand placement and initiation of task    Transfers                   General transfer comment: Not performed due to poor bed mobility performance and safety concerns    Ambulation/Gait               General Gait Details: Not performed secondary to safety concerns   Stairs             Wheelchair Mobility     Tilt Bed    Modified Rankin (Stroke Patients Only)       Balance Overall balance assessment: Needs assistance     Sitting balance - Comments: Unable to perform due to poor participation and safety concerns       Standing balance comment: Unable to assess at this time                            Cognition Arousal: Alert Behavior During Therapy: Anxious Overall Cognitive Status: History of cognitive impairments - at baseline Area of Impairment: Orientation, Attention, Memory, Following commands, Safety/judgement, Problem solving, Awareness                 Orientation Level: Disoriented to, Place, Time, Situation Current Attention Level: Focused Memory: Decreased recall of precautions, Decreased short-term memory Following Commands: Follows one step commands inconsistently, Follows one step commands with increased time Safety/Judgement: Decreased awareness of safety, Decreased awareness of deficits Awareness: Intellectual Problem Solving: Decreased initiation, Difficulty sequencing, Requires verbal cues, Requires tactile cues, Slow processing General Comments: Pt is alert but cont to demonstrate confused state  and inconsistancy following simple commands for bed exercises        Exercises Total Joint Exercises Ankle Circles/Pumps: AAROM, Both, 15 reps Heel Slides: AAROM, Both, 5 reps Hip ABduction/ADduction: AAROM, Both, 10 reps    General Comments        Pertinent Vitals/Pain Pain Assessment Pain Assessment: Faces Faces Pain Scale: Hurts a little bit Pain Location: mid/low back Pain Descriptors / Indicators:  Aching Pain Intervention(s): Limited activity within patient's tolerance, Repositioned    Home Living                          Prior Function            PT Goals (current goals can now be found in the care plan section) Acute Rehab PT Goals Patient Stated Goal: none stated PT Goal Formulation: Patient unable to participate in goal setting Time For Goal Achievement: 04/12/23 Potential to Achieve Goals: Fair Progress towards PT goals: Progressing toward goals    Frequency    Min 1X/week      PT Plan      Co-evaluation              AM-PAC PT "6 Clicks" Mobility   Outcome Measure  Help needed turning from your back to your side while in a flat bed without using bedrails?: A Lot Help needed moving from lying on your back to sitting on the side of a flat bed without using bedrails?: A Lot Help needed moving to and from a bed to a chair (including a wheelchair)?: Total Help needed standing up from a chair using your arms (e.g., wheelchair or bedside chair)?: Total Help needed to walk in hospital room?: Total Help needed climbing 3-5 steps with a railing? : Total 6 Click Score: 8    End of Session   Activity Tolerance: Patient limited by pain Patient left: in bed;with call bell/phone within reach;Other (comment);with bed alarm set (fall mat in place) Nurse Communication: Mobility status PT Visit Diagnosis: Muscle weakness (generalized) (M62.81);Difficulty in walking, not elsewhere classified (R26.2);Other abnormalities of gait and mobility (R26.89)     Time: 7829-5621 PT Time Calculation (min) (ACUTE ONLY): 14 min  Charges:                            Elmon Else, SPT    Sallie Staron 04/13/2023, 10:39 AM

## 2023-04-13 NOTE — Progress Notes (Signed)
Bladder scan 397 mLs. Dr Fran Lowes aware.  Cornell Barman Floraine Buechler

## 2023-04-14 ENCOUNTER — Inpatient Hospital Stay: Payer: Commercial Managed Care - PPO

## 2023-04-14 DIAGNOSIS — R6520 Severe sepsis without septic shock: Secondary | ICD-10-CM | POA: Diagnosis not present

## 2023-04-14 DIAGNOSIS — A419 Sepsis, unspecified organism: Secondary | ICD-10-CM | POA: Diagnosis not present

## 2023-04-14 LAB — MAGNESIUM: Magnesium: 1.7 mg/dL (ref 1.7–2.4)

## 2023-04-14 LAB — POTASSIUM: Potassium: 3.1 mmol/L — ABNORMAL LOW (ref 3.5–5.1)

## 2023-04-14 NOTE — Progress Notes (Signed)
PROGRESS NOTE    Ryan Rose   ONG:[id number] DOB: [birth year]  DOA: 03/21/2023 Date of Service: 04/14/23 PCP: Sherron Monday, MD     Brief Narrative / Hospital Course:  Ryan Rose is a 62 y.o. male with medical history significant of AAA status post repair and stenting, alcohol abuse, CAD, HTN, PVD, presented with persistent nauseous vomiting chest pain abdominal pain x1 day. He admitted he drinks occasionally and last drink was 2 days ago.  Significant hospital events:  08/12: admitted to hospitalist service. Aortic dissection r/o, CTA (+)other concerns w/ esophageal thickening, chronic diverticulitis, severe diffuse hepatic steatosis.  08/15: Patient had upper endoscopy done which showed grade D esophagitis, large hiatal hernia and single nonbleeding angiectasia in the stomach treated with argon plasma. 08/16: Patient's hemoglobin dipped down to 6.5. Transfused 1 unit PRBC 08/18: Chest x-ray possible aspiration pneumonia.  Started on Unasyn. 08/20: Fever, hypotensive, tachycardia.  Patient given sepsis fluid bolus.  Antibiotics changed over to Maxipime and Flagyl.  MRCP ordered.  In the afternoon and blood pressure dropped down into the 70s and not taking midodrine, transfer to the stepdown for closer monitoring. 08/23: cholecystostomy tube placement  Since then and to today 04/14/23 - difficult placement, concern for dementia and behavioral difficulty d/t this. Intermittent urinary retention, avoiding Foley d/t confusion and pt has removed IV lines etc. Has not been eating much, decline for feeding tube, palliative care following.     Consultants:  Gastroenterology  [facility] General Surgery Palliative Care Interventional Radiology   Procedures:  Upper endoscopy 08/15 which showed grade D esophagitis, large hiatal hernia and single nonbleeding angiectasia in the stomach treated with argon plasma.   MRCP 08/20--Severe diffuse fatty infiltration of the liver. No focal  hepatic lesions are identified without contrast. 2. Distended gallbladder with layering sludge. No definite gallstones. Gallbladder wall thickening and pericholecystic inflammatory changes could suggest acalculus cholecystitis or cholestasis. Normal caliber and course of the common bile duct. 3. Small bilateral pleural effusions with overlying atelectasis. There is also a cystic appearing fluid collection near the distal esophagus on the right side. This was not present on the prior CT scan and could be loculated pleural fluid. 4. Moderate mesenteric edema and small volume ascites.  Cholecystotomy tube placement 08/23         ASSESSMENT & PLAN:   Principal Problem:   Severe sepsis (HCC) Active Problems:   Acute blood loss anemia   Alcohol withdrawal delirium (HCC)   Acute upper GI bleed   Aspiration pneumonia (HCC)   Alcohol withdrawal (HCC)   Nausea & vomiting   Hypophosphatemia   Hypomagnesemia   Hypokalemia   Alcoholic ketoacidosis   PVD (peripheral vascular disease) (HCC)   Transaminitis   Macrocytic anemia   Abdominal aortic aneurysm (AAA) (HCC)   Leukocytosis   Alcohol abuse   Bilirubinemia   Hyponatremia   Arterial hypotension   Acute acalculous cholecystitis   Malnutrition of moderate degree   Severe sepsis (HCC) Acalculus cholecystitis S/p drain  MRCP as above Gen surgery consulted 8/22-- HIDA scan abnormal. Discussed with general surgery. Patient is not a surgical candidate. Recommended gallbladder drain placement.  received 2 days of Unasyn f/b 6 days of cefepime and flagyl.  Transitioned to Augmentin when pt lost IV.  Pt completed 10-day course of abx. cont drain management   Alcohol withdrawal delirium  Dementia suspect alcohol related Received 2 days of high-dose thiamine.   Right now patient does not have capacity make medical decisions.  confused at baseline however is redirected well.   Acute blood loss anemia Macrocytic anemia s/p 2u  PRBC monitor Hgb and transfuse to keep Hgb >7   Acute upper GI bleed EGD on 8/15 (+)grade D esophagitis, hiatal hernia and angiectasia which was treated with argon plasma coagulation.  cont PPI   Aspiration pneumonia - resolved  on abx, treated    Hypophosphatemia  Hypomagnesemia Hypokalemia monitor and supplement PRN   Alcoholic ketoacidosis Resolved   PVD (peripheral vascular disease)  hold plavix given GI bleed  cont ASA and statin   Transaminitis Secondary to alcohol abuse.   Alkaline phosphatase 205, total bilirubin of 5.1, AST 117, albumin low at 1.6 Monitor periodic CMP    Abdominal aortic aneurysm  No endoleak on CTA on admission. Follow outpatient    Hyponatremia Resolved Monitor BMP   Leukocytosis persistent.  Procal however decreased from 1.64 to 0.73 with abx. Monitor CBC   Poor oral intake refusing all Ensures and eating very little even while staff trying to feed him.  Refusing meds sometimes. started appetite stimulant Marinol, per sister request   Acute urinary retention, intermittent I/O cath removed ~600 ml urine, x2. cont bladder scan q6h, if post-void >500 ml, then I/O cath.  Try to avoid Foley placement since there is a high risk of pt pulling it out. cont Flomax (new) try to position pt upright to help pt void   Current smoker pt persistently asked to go smoke Nicotine patch    DVT prophylaxis: lovenox IV fluids: no continuous IV fluids  Nutrition: regular diet, he has poor po intake  Central lines / invasive devices: cholecystostomy tube   Code Status: DNR ACP documentation reviewed: 04/14/23 MOST form is on file in [facility]  Current Admission Status: inpatient, Med/Surg  LOS: 24 TOC needs: placement Barriers to discharge / significant pending items: placement              Subjective / Brief ROS:  Patient sleeping on rounds but easily awoken, mumbles one-word answers - denies pain, denies trouble breathing,   Family  Communication: none at this time     Objective Findings:  Vitals:   04/13/23 2126 04/14/23 0443 04/14/23 0555 04/14/23 0823  BP: 115/80 100/80 110/83 110/85  Pulse: 95 (!) 101 (!) 104 (!) 104  Resp: 16 18 18 20   Temp: 98.6 F (37 C) 98.5 F (36.9 C) 98.3 F (36.8 C) 98 F (36.7 C)  TempSrc: Oral     SpO2: 96% 94% 96% 97%  Weight:      Height:        Intake/Output Summary (Last 24 hours) at 04/14/2023 1553 Last data filed at 04/14/2023 1233 Gross per 24 hour  Intake 10 ml  Output 514 ml  Net -504 ml   Filed Weights   03/25/23 0959 03/29/23 0623 03/30/23 1855  Weight: 63.2 kg 64.9 kg 65 kg    Examination:  Physical Exam Constitutional:      General: He is not in acute distress. Cardiovascular:     Rate and Rhythm: Normal rate and regular rhythm.  Pulmonary:     Effort: Pulmonary effort is normal.  Abdominal:     Palpations: Abdomen is soft.  Skin:    General: Skin is warm and dry.  Neurological:     Mental Status: He is alert.  Psychiatric:        Mood and Affect: Mood normal.        Behavior: Behavior normal.  Scheduled Medications:   aspirin EC  81 mg Oral Daily   atorvastatin  40 mg Oral Daily   dronabinol  5 mg Oral QAC lunch   enoxaparin (LOVENOX) injection  40 mg Subcutaneous Q24H   feeding supplement  237 mL Oral TID BM   folic acid  1 mg Oral Daily   magnesium oxide  400 mg Oral BID   melatonin  2.5 mg Oral QHS   multivitamin with minerals  1 tablet Oral Daily   nicotine  14 mg Transdermal Daily   pantoprazole  40 mg Oral Daily   pneumococcal 20-valent conjugate vaccine  0.5 mL Intramuscular Tomorrow-1000   QUEtiapine  12.5 mg Oral QHS   sodium chloride flush  5 mL Intracatheter Q8H   sucralfate  1 g Oral TID WC & HS   tamsulosin  0.4 mg Oral QPC breakfast    Continuous Infusions:   PRN Medications:  acetaminophen **OR** acetaminophen, chlordiazePOXIDE, ipratropium-albuterol, LORazepam, ondansetron (ZOFRAN) IV, mouth rinse,  phenol  Antimicrobials from admission:  Anti-infectives (From admission, onward)    Start     Dose/Rate Route Frequency Ordered Stop   04/06/23 1030  amoxicillin-clavulanate (AUGMENTIN) 875-125 MG per tablet 1 tablet        1 tablet Oral Every 12 hours 04/06/23 0936 04/10/23 0959   04/05/23 1400  piperacillin-tazobactam (ZOSYN) IVPB 3.375 g  Status:  Discontinued        3.375 g 12.5 mL/hr over 240 Minutes Intravenous Every 8 hours 04/05/23 1000 04/06/23 0936   03/30/23 1800  ceFEPIme (MAXIPIME) 2 g in sodium chloride 0.9 % 100 mL IVPB  Status:  Discontinued        2 g 200 mL/hr over 30 Minutes Intravenous Every 8 hours 03/30/23 1235 04/05/23 1000   03/30/23 1000  metroNIDAZOLE (FLAGYL) IVPB 500 mg  Status:  Discontinued        500 mg 100 mL/hr over 60 Minutes Intravenous Every 12 hours 03/30/23 0819 04/05/23 1000   03/30/23 0900  ceFEPIme (MAXIPIME) 2 g in sodium chloride 0.9 % 100 mL IVPB        2 g 200 mL/hr over 30 Minutes Intravenous  Once 03/30/23 0819 03/30/23 1042   03/28/23 1600  Ampicillin-Sulbactam (UNASYN) 3 g in sodium chloride 0.9 % 100 mL IVPB  Status:  Discontinued        3 g 200 mL/hr over 30 Minutes Intravenous Every 6 hours 03/28/23 1442 03/30/23 0819   03/25/23 2000  cefTRIAXone (ROCEPHIN) 1 g in sodium chloride 0.9 % 100 mL IVPB  Status:  Discontinued        1 g 200 mL/hr over 30 Minutes Intravenous Every 24 hours 03/25/23 1837 03/27/23 0719           Data Reviewed:  I have personally reviewed the following...  CBC: Recent Labs  Lab 04/08/23 0854 04/09/23 0403 04/12/23 0342  WBC 17.0* 15.2* 14.7*  HGB 10.8* 11.3* 9.7*  HCT 33.3* 33.3* 29.3*  MCV 96.2 94.9 95.4  PLT 234 258 298   Basic Metabolic Panel: Recent Labs  Lab 04/08/23 0450 04/09/23 0403 04/12/23 0342 04/13/23 0426 04/14/23 0450  NA 140 145 143  --   --   K 2.9* 3.2* 2.9* 3.2* 3.1*  CL 115* 109 112*  --   --   CO2 19* 23 22  --   --   GLUCOSE 94 112* 84  --   --   BUN 9 7* 7*   --   --  CREATININE 0.52* 0.50* 0.41*  --   --   CALCIUM 7.4* 8.2* 7.4*  --   --   MG 1.9 1.9 1.5*  --  1.7   GFR: Estimated Creatinine Clearance: 88 mL/min (A) (by C-G formula based on SCr of 0.41 mg/dL (L)). Liver Function Tests: No results for input(s): "AST", "ALT", "ALKPHOS", "BILITOT", "PROT", "ALBUMIN" in the last 168 hours. No results for input(s): "LIPASE", "AMYLASE" in the last 168 hours. No results for input(s): "AMMONIA" in the last 168 hours. Coagulation Profile: No results for input(s): "INR", "PROTIME" in the last 168 hours. Cardiac Enzymes: No results for input(s): "CKTOTAL", "CKMB", "CKMBINDEX", "TROPONINI" in the last 168 hours. BNP (last 3 results) No results for input(s): "PROBNP" in the last 8760 hours. HbA1C: No results for input(s): "HGBA1C" in the last 72 hours. CBG: No results for input(s): "GLUCAP" in the last 168 hours. Lipid Profile: No results for input(s): "CHOL", "HDL", "LDLCALC", "TRIG", "CHOLHDL", "LDLDIRECT" in the last 72 hours. Thyroid Function Tests: No results for input(s): "TSH", "T4TOTAL", "FREET4", "T3FREE", "THYROIDAB" in the last 72 hours. Anemia Panel: No results for input(s): "VITAMINB12", "FOLATE", "FERRITIN", "TIBC", "IRON", "RETICCTPCT" in the last 72 hours. Most Recent Urinalysis On File:     Component Value Date/Time   COLORURINE AMBER (A) 04/12/2023 1004   APPEARANCEUR CLEAR 04/12/2023 1004   LABSPEC 1.025 04/12/2023 1004   PHURINE 5.0 04/12/2023 1004   GLUCOSEU NEGATIVE 04/12/2023 1004   HGBUR TRACE (A) 04/12/2023 1004   BILIRUBINUR MODERATE (A) 04/12/2023 1004   KETONESUR TRACE (A) 04/12/2023 1004   PROTEINUR 30 (A) 04/12/2023 1004   NITRITE POSITIVE (A) 04/12/2023 1004   LEUKOCYTESUR NEGATIVE 04/12/2023 1004   Sepsis Labs: @LABRCNTIP (procalcitonin:4,lacticidven:4) Microbiology: Recent Results (from the past 240 hour(s))  Urine Culture (for pregnant, neutropenic or urologic patients or patients with an indwelling  urinary catheter)     Status: None   Collection Time: 04/12/23  2:45 PM   Specimen: In/Out Cath Urine  Result Value Ref Range Status   Specimen Description   Final    IN/OUT CATH URINE Performed at [facility], [address] [place], [place] [address]    Special Requests   Final    NONE Performed at [facility], [address] [place], [place] [address]    Culture   Final    NO GROWTH Performed at [facility], [address] [place], [place] [address]    Report Status 04/13/2023 FINAL  Final      Radiology Studies last 3 days: No results found.           LOS: 24 days      Sunnie Nielsen, DO [facility] 04/14/2023, 3:53 PM    Dictation software may have been used to generate the above note. Typos may occur and escape review in typed/dictated notes. Please contact Dr Lyn Hollingshead directly for clarity if needed.  Staff may message me via secure chat in Epic  but this may not receive an immediate response,  please page me for urgent matters!  If 7PM-7AM, please contact night coverage [email or web address]

## 2023-04-14 NOTE — Progress Notes (Signed)
Nutrition Follow-up  DOCUMENTATION CODES:   Non-severe (moderate) malnutrition in context of chronic illness  INTERVENTION:   Ensure Enlive po TID, each supplement provides 350 kcal and 20 grams of protein.  Magic cup TID with meals, each supplement provides 290 kcal and 9 grams of protein  MVI, folic acid and thiamine daily   Vitamin C 500mg  po BID  Pt remains at high refeed risk; recommend monitor potassium, magnesium and phosphorus labs daily until stable  NUTRITION DIAGNOSIS:   Moderate Malnutrition related to chronic illness (ETOH abuse) as evidenced by percent weight loss, mild fat depletion, moderate fat depletion, mild muscle depletion, moderate muscle depletion. -ongoing   GOAL:   Patient will meet greater than or equal to 90% of their needs -not met   MONITOR:   PO intake, Supplement acceptance, Labs, Weight trends, Skin, I & O's  ASSESSMENT:   62 y/o male with h/o hiatal hernia, etoh abuse, AAA, PVD, HLD and CAD who is admitted with acalculus cholecystitis s/p IR drain 8/23, sepsis, GIB and delerium.  Pt continues to have poor appetite and oral intake in hospital. Pt has now been without adequate nutrition for 3 weeks. Family declined feeding tube. Marinol initiated on 8/30 but no improvement in oral intake since starting it. Per chart, pt is down ~26lbs(16%) since 7/5; this is significant weight loss. Pt has not been weighed since 8/20; will request daily weights. Nutritional status discussed with MD; RD will monitor for plan of care.   Medications reviewed and include: aspirin, marinol, lovenox, folic acid, Mg oxide, melatonin, nicotine, MVI, protonix, carafate   Labs reviewed: K 3.1(L), Mg 1.7 wnl Wbc- 14.7(H), Hgb 9.7(L), Hct 29.3(L)  Drains- 85ml   Diet Order:   Diet Order             DIET DYS 2 Room service appropriate? Yes; Fluid consistency: Thin  Diet effective now                  EDUCATION NEEDS:   No education needs have been  identified at this time  Skin:  Skin Assessment: Reviewed RN Assessment (Stage II coccyx, skin tears)  Last BM:  04/05/23 (type 7)  Height:   Ht Readings from Last 1 Encounters:  03/30/23 5\' 9"  (1.753 m)    Weight:   Wt Readings from Last 1 Encounters:  03/30/23 65 kg    Ideal Body Weight:  72.7 kg  BMI:  Body mass index is 21.16 kg/m.  Estimated Nutritional Needs:   Kcal:  1900-2200kcal/day  Protein:  95-110g/day  Fluid:  1.9-2.2L/day  Betsey Holiday MS, RD, LDN Please refer to [facility] for RD and/or RD on-call/weekend/after hours pager

## 2023-04-14 NOTE — Progress Notes (Signed)
At 2134, charge nurse, tech, and myself entered the patient room due to hearing the bed alarm. Upon entering, patient was found sitting on the floor, laying his back against the side of the bed. Bed mats on the floor, underneath the patient. Bed alarm was active, non skid socks were on his feet. Biliary drain and male external catheter are still in place. Patient was lifted back into bed with two assist. Vitals and physical assessment were started. Vitals normal. Patient stated he hit the top of his head and that it hurts, but could not give me a number between 0-10. Palpated the top of his head, no bruising, no edema, no wounds or bleeding, normal skin color. Patient has no other complaints. Extremities were assessed, no noticeable fractures or bruising, pulses equal and palpapable, arms and legs move with equal strength. No notable skin differences since last assessment at 724pm tonight. Patient states the reason for exiting the bed was, "I was trying to get over to that black chair over there". Attempted to point towards the recliner in the corner. I asked if he was trying to sit in the recliner, patient declined and stating, "no, I'm talking about that black chair over there". No other chair next to the recliner. Informed patient at the only chair available was the recliner. Patient responded, "well, then go check and see if it is in the garage". Patient is alert to name, DOB, hospital, city, state, and current year. Thought it was October and did not know why he is in the hospital. Patient more alert when being asked questions, but when not communicating with others, patient will make incoherent sentences and called out for his brother Judie Petit, who is not in the hospital. Among other medical issues, patient has dementia suspect alcohol related. "Right now patient does not have capacity make medical decisions. confused at baseline however is redirected well." per progress notes. Dr. Arville Care was called directly and  made aware of patient fall. Dr. Arville Care ordered STAT head CT scan without contrast, as of 1146pm results pending. Post fall vitals and monitoring initiated and patient placed on camera monitoring system.  Edit: The AC from night shift was also notified about the patient fall. Also, from 1135pm to 6am patient refused to let staff check his vitals, perform midnight bladder scan, or give his scheduled meds. Patient agitated with staff. Was able to perform bladder scan and vitals at 6am. Dr. Arville Care aware of patient refusal.

## 2023-04-14 NOTE — Progress Notes (Signed)
Provider Notification: Sunnie Nielsen   Patient was bladder scanned for 450 ml and he is unable to urinate.   Recommendation:  Received orders to do a straight cath and bladder scanned patient again in a few hrs.

## 2023-04-14 NOTE — Progress Notes (Signed)
Physical Therapy Treatment Patient Details Name: Ryan Rose MRN: [id number] DOB: 11-12-1960 Today's Date: 04/14/2023   History of Present Illness Pt is a 62 y/o M admitted on 03/21/23 after presenting with c/c of N&V, chest & abdominal pain. Pt is being treated for alcohol withdrawal delirium, ABLA, acute upper GI bleed. PMH: AAA s/p repair & stenting, alcohol abuse, CAD, HTN, PVD    PT Comments  Pt is making very limited progress towards goals secondary to cognition and inconsistent participation. Pt calling out several times to get OOB and therapist went into room to address concerns. New goals updated this date. Pt still remains confused and anxious with mobility. Not safe to attempt transfer to recliner this date. Will continue to progress as able.    If plan is discharge home, recommend the following: Two people to help with bathing/dressing/bathroom;Two people to help with walking and/or transfers;Help with stairs or ramp for entrance;Assist for transportation;Assistance with cooking/housework;Assistance with feeding;Direct supervision/assist for financial management;Direct supervision/assist for medications management;Supervision due to cognitive status   Can travel by private vehicle     No  Equipment Recommendations   (TBD)    Recommendations for Other Services       Precautions / Restrictions Precautions Precautions: Fall Restrictions Weight Bearing Restrictions: No     Mobility  Bed Mobility Overal bed mobility: Needs Assistance Bed Mobility: Supine to Sit, Sit to Supine     Supine to sit: Max assist, +2 for physical assistance Sit to supine: Total assist, +2 for physical assistance   General bed mobility comments: initially does reach for railing, however becomes increasingly agitated with physical assist required for transition to EOB. Once seated, slight L side lateral lean, however is able to be corrected with cues. Needs +2 for all assist and repositioning  back in bed.    Transfers Overall transfer level: Needs assistance Equipment used: Rolling walker (2 wheels) Transfers: Sit to/from Stand Sit to Stand: Total assist, +2 physical assistance, From elevated surface           General transfer comment: Able to perform 2 attempts for sit<>stand, first attempt without RW and second attempt with RW. Pt with difficulty with ant lean and buckling B knees. Poor tolerance. Not safe to further progress mobility efforts.    Ambulation/Gait               General Gait Details: Not performed secondary to safety concerns   Stairs             Wheelchair Mobility     Tilt Bed    Modified Rankin (Stroke Patients Only)       Balance Overall balance assessment: Needs assistance Sitting-balance support: No upper extremity supported, Feet supported Sitting balance-Leahy Scale: Fair     Standing balance support: Bilateral upper extremity supported, Reliant on assistive device for balance Standing balance-Leahy Scale: Zero                              Cognition Arousal: Alert Behavior During Therapy: Impulsive, Agitated Overall Cognitive Status: History of cognitive impairments - at baseline                                 General Comments: alert and impulsive- wants to get OOB, however then states "wait a minute, wait a minute". Needs heavy coaxing for participation  Exercises Other Exercises Other Exercises: attempted to perform therex, however pt unable to participate and is easily distracted    General Comments        Pertinent Vitals/Pain Pain Assessment Pain Assessment: Faces Faces Pain Scale: Hurts a little bit Pain Location: mid/low back Pain Descriptors / Indicators: Aching Pain Intervention(s): Limited activity within patient's tolerance, Repositioned    Home Living                          Prior Function            PT Goals (current goals can now be  found in the care plan section) Acute Rehab PT Goals Patient Stated Goal: wants to go home PT Goal Formulation: With patient Time For Goal Achievement: 04/28/23 Potential to Achieve Goals: Fair Progress towards PT goals: Progressing toward goals    Frequency    Min 1X/week      PT Plan      Co-evaluation              AM-PAC PT "6 Clicks" Mobility   Outcome Measure  Help needed turning from your back to your side while in a flat bed without using bedrails?: A Lot Help needed moving from lying on your back to sitting on the side of a flat bed without using bedrails?: A Lot Help needed moving to and from a bed to a chair (including a wheelchair)?: Total Help needed standing up from a chair using your arms (e.g., wheelchair or bedside chair)?: Total Help needed to walk in hospital room?: Total Help needed climbing 3-5 steps with a railing? : Total 6 Click Score: 8    End of Session   Activity Tolerance: Treatment limited secondary to agitation Patient left: in bed;with bed alarm set Nurse Communication: Mobility status PT Visit Diagnosis: Muscle weakness (generalized) (M62.81);Difficulty in walking, not elsewhere classified (R26.2);Other abnormalities of gait and mobility (R26.89)     Time: 1610-9604 PT Time Calculation (min) (ACUTE ONLY): 16 min  Charges:    $Therapeutic Activity: 8-22 mins PT General Charges $$ ACUTE PT VISIT: 1 Visit                     Elizabeth Palau, PT, DPT, GCS [phone]    Donnell Wion 04/14/2023, 11:26 AM

## 2023-04-15 DIAGNOSIS — R6520 Severe sepsis without septic shock: Secondary | ICD-10-CM | POA: Diagnosis not present

## 2023-04-15 DIAGNOSIS — A419 Sepsis, unspecified organism: Secondary | ICD-10-CM | POA: Diagnosis not present

## 2023-04-15 LAB — BASIC METABOLIC PANEL
Anion gap: 8 (ref 5–15)
BUN: <5 mg/dL — ABNORMAL LOW (ref 8–23)
CO2: 24 mmol/L (ref 22–32)
Calcium: 7.2 mg/dL — ABNORMAL LOW (ref 8.9–10.3)
Chloride: 107 mmol/L (ref 98–111)
Creatinine, Ser: 0.36 mg/dL — ABNORMAL LOW (ref 0.61–1.24)
GFR, Estimated: >60 mL/min (ref >60)
Glucose, Bld: 90 mg/dL (ref 70–99)
Potassium: 3 mmol/L — ABNORMAL LOW (ref 3.5–5.1)
Sodium: 139 mmol/L (ref 135–145)

## 2023-04-15 LAB — CBC
HCT: 31.3 % — ABNORMAL LOW (ref 39.0–52.0)
Hemoglobin: 10.1 g/dL — ABNORMAL LOW (ref 13.0–17.0)
MCH: 31.5 pg (ref 26.0–34.0)
MCHC: 32.3 g/dL (ref 30.0–36.0)
MCV: 97.5 fL (ref 80.0–100.0)
Platelets: 282 10*3/uL (ref 150–400)
RBC: 3.21 MIL/uL — ABNORMAL LOW (ref 4.22–5.81)
RDW: 19.9 % — ABNORMAL HIGH (ref 11.5–15.5)
WBC: 12.4 10*3/uL — ABNORMAL HIGH (ref 4.0–10.5)
nRBC: 0 % (ref 0.0–0.2)

## 2023-04-15 LAB — MAGNESIUM: Magnesium: 1.6 mg/dL — ABNORMAL LOW (ref 1.7–2.4)

## 2023-04-15 MED ORDER — MAGNESIUM SULFATE 2 GM/50ML IV SOLN: 2.0000 g | Freq: Once | INTRAVENOUS | Status: DC

## 2023-04-15 MED ORDER — POTASSIUM CHLORIDE 20 MEQ PO PACK
40.0000 meq | PACK | Freq: Two times a day (BID) | ORAL | Status: AC
  Administered 2023-04-15: 40 meq via ORAL
  Filled 2023-04-15: qty 2

## 2023-04-15 MED ORDER — LORAZEPAM 2 MG/ML IJ SOLN
1.0000 mg | Freq: Three times a day (TID) | INTRAMUSCULAR | Status: DC | PRN
  Administered 2023-04-15 – 2023-04-19 (×2): 1 mg via INTRAMUSCULAR
  Filled 2023-04-15 (×2): qty 1

## 2023-04-15 NOTE — TOC Progression Note (Signed)
Transition of Care [facility]) - Progression Note    Patient Details  Name: NATAN COOLE MRN: [id number] Date of Birth: 1961/05/05  Transition of Care [facility] [address]) CM/SW Contact  Chapman Fitch, RN Phone Number: 04/15/2023, 2:47 PM  Clinical Narrative:     Asset assessment with wife - home and car are in wife's name, no joint accounts, patient has $2,000 in checking.  Earns [phone] a month from disability and $87 of that goes to life insurance premiums.  30 Day LOG approved for room and board at SNF by Wandra Mannan.  Sent out referral again as 30 day LOG and Medicaid pending         Expected Discharge Plan and Services                                               Social Determinants of Health (SDOH) Interventions SDOH Screenings   Food Insecurity: Patient Declined (03/21/2023)  Housing: Patient Declined (03/21/2023)  Transportation Needs: No Transportation Needs (03/21/2023)  Utilities: Not At Risk (03/21/2023)  Tobacco Use: High Risk (04/01/2023)    Readmission Risk Interventions     No data to display

## 2023-04-15 NOTE — Plan of Care (Signed)
  Problem: Education: Goal: Knowledge of General Education information will improve Description: Including pain rating scale, medication(s)/side effects and non-pharmacologic comfort measures 04/15/2023 0409 by Lamount Cohen, RN Outcome: Progressing 04/15/2023 0409 by Lamount Cohen, RN Outcome: Progressing   Problem: Health Behavior/Discharge Planning: Goal: Ability to manage health-related needs will improve 04/15/2023 0409 by Lamount Cohen, RN Outcome: Progressing 04/15/2023 0409 by Lamount Cohen, RN Outcome: Progressing   Problem: Clinical Measurements: Goal: Ability to maintain clinical measurements within normal limits will improve 04/15/2023 0409 by Lamount Cohen, RN Outcome: Progressing 04/15/2023 0409 by Lamount Cohen, RN Outcome: Progressing Goal: Will remain free from infection 04/15/2023 0409 by Lamount Cohen, RN Outcome: Progressing 04/15/2023 0409 by Lamount Cohen, RN Outcome: Progressing Goal: Diagnostic test results will improve 04/15/2023 0409 by Lamount Cohen, RN Outcome: Progressing 04/15/2023 0409 by Lamount Cohen, RN Outcome: Progressing Goal: Respiratory complications will improve 04/15/2023 0409 by Lamount Cohen, RN Outcome: Progressing 04/15/2023 0409 by Lamount Cohen, RN Outcome: Progressing Goal: Cardiovascular complication will be avoided 04/15/2023 0409 by Lamount Cohen, RN Outcome: Progressing 04/15/2023 0409 by Lamount Cohen, RN Outcome: Progressing   Problem: Activity: Goal: Risk for activity intolerance will decrease 04/15/2023 0409 by Lamount Cohen, RN Outcome: Progressing 04/15/2023 0409 by Lamount Cohen, RN Outcome: Progressing   Problem: Nutrition: Goal: Adequate nutrition will be maintained 04/15/2023 0409 by Lamount Cohen, RN Outcome: Progressing 04/15/2023 0409 by Lamount Cohen, RN Outcome: Progressing   Problem: Coping: Goal: Level of anxiety will decrease 04/15/2023 0409 by Lamount Cohen, RN Outcome: Progressing 04/15/2023 0409 by Lamount Cohen, RN Outcome: Progressing   Problem: Elimination: Goal: Will not experience complications related to bowel motility 04/15/2023 0409 by Lamount Cohen, RN Outcome: Progressing 04/15/2023 0409 by Lamount Cohen, RN Outcome: Progressing Goal: Will not experience complications related to urinary retention 04/15/2023 0409 by Lamount Cohen, RN Outcome: Progressing 04/15/2023 0409 by Lamount Cohen, RN Outcome: Progressing   Problem: Pain Managment: Goal: General experience of comfort will improve 04/15/2023 0409 by Lamount Cohen, RN Outcome: Progressing 04/15/2023 0409 by Lamount Cohen, RN Outcome: Progressing   Problem: Safety: Goal: Ability to remain free from injury will improve 04/15/2023 0409 by Lamount Cohen, RN Outcome: Progressing 04/15/2023 0409 by Lamount Cohen, RN Outcome: Progressing   Problem: Skin Integrity: Goal: Risk for impaired skin integrity will decrease 04/15/2023 0409 by Lamount Cohen, RN Outcome: Progressing 04/15/2023 0409 by Lamount Cohen, RN Outcome: Progressing   Problem: Fluid Volume: Goal: Hemodynamic stability will improve 04/15/2023 0409 by Lamount Cohen, RN Outcome: Progressing 04/15/2023 0409 by Lamount Cohen, RN Outcome: Progressing   Problem: Clinical Measurements: Goal: Diagnostic test results will improve 04/15/2023 0409 by Lamount Cohen, RN Outcome: Progressing 04/15/2023 0409 by Lamount Cohen, RN Outcome: Progressing Goal: Signs and symptoms of infection will decrease 04/15/2023 0409 by Lamount Cohen, RN Outcome: Progressing 04/15/2023 0409 by Lamount Cohen, RN Outcome: Progressing   Problem: Respiratory: Goal: Ability to maintain adequate ventilation will improve 04/15/2023 0409 by Lamount Cohen, RN Outcome: Progressing 04/15/2023 0409 by Lamount Cohen, RN Outcome: Progressing

## 2023-04-15 NOTE — Plan of Care (Signed)
  Problem: Education: Goal: Knowledge of General Education information will improve Description: Including pain rating scale, medication(s)/side effects and non-pharmacologic comfort measures Outcome: Not Progressing   Problem: Health Behavior/Discharge Planning: Goal: Ability to manage health-related needs will improve Outcome: Not Progressing   Problem: Clinical Measurements: Goal: Ability to maintain clinical measurements within normal limits will improve Outcome: Not Progressing     Patient is non-compliant with all care. Refusing meds, food, and drink

## 2023-04-15 NOTE — Progress Notes (Signed)
Patient is very non-compliant with care. He is refusing all medications. He said his legs were hurting and agreed to tylenol but refused the tylenol when I brought it in. He is refusing to let me turn him to relieve pressure off of his buttock

## 2023-04-15 NOTE — Progress Notes (Addendum)
Patient continues to try to get out of bed so he can get dressed and leave.I continue to reorient the patient. I informed Dr Lyn Hollingshead that the patient is refusing all meds and all care

## 2023-04-15 NOTE — Progress Notes (Signed)
PROGRESS NOTE    Ryan Rose   ZOX:[id number] DOB: [birth year]  DOA: 03/21/2023 Date of Service: 04/15/23 PCP: Sherron Monday, MD     Brief Narrative / Hospital Course:  Ryan Rose is a 62 y.o. male with medical history significant of AAA status post repair and stenting, alcohol abuse, CAD, HTN, PVD, presented with persistent nauseous vomiting chest pain abdominal pain x1 day. He admitted he drinks occasionally and last drink was 2 days ago.  Significant hospital events:  08/12: admitted to hospitalist service. Aortic dissection r/o, CTA (+)other concerns w/ esophageal thickening, chronic diverticulitis, severe diffuse hepatic steatosis.  08/15: Patient had upper endoscopy done which showed grade D esophagitis, large hiatal hernia and single nonbleeding angiectasia in the stomach treated with argon plasma. 08/16: Patient's hemoglobin dipped down to 6.5. Transfused 1 unit PRBC 08/18: Chest x-ray possible aspiration pneumonia.  Started on Unasyn. 08/20: Fever, hypotensive, tachycardia.  Patient given sepsis fluid bolus.  Antibiotics changed over to Maxipime and Flagyl.  MRCP ordered.  In the afternoon and blood pressure dropped down into the 70s and not taking midodrine, transfer to the stepdown for closer monitoring. 08/23: cholecystostomy tube placement  Since then and to today 04/15/23 - difficult placement, concern for dementia and behavioral difficulty d/t this. Intermittent urinary retention, avoiding Foley d/t confusion and pt has removed IV lines etc. Has not been eating much, not good candidate for feeding tube, palliative care following peripherally.     Consultants:  Gastroenterology  [facility] General Surgery Palliative Care Interventional Radiology   Procedures:  Upper endoscopy 08/15 which showed grade D esophagitis, large hiatal hernia and single nonbleeding angiectasia in the stomach treated with argon plasma.   MRCP 08/20--Severe diffuse fatty  infiltration of the liver. No focal hepatic lesions are identified without contrast. 2. Distended gallbladder with layering sludge. No definite gallstones. Gallbladder wall thickening and pericholecystic inflammatory changes could suggest acalculus cholecystitis or cholestasis. Normal caliber and course of the common bile duct. 3. Small bilateral pleural effusions with overlying atelectasis. There is also a cystic appearing fluid collection near the distal esophagus on the right side. This was not present on the prior CT scan and could be loculated pleural fluid. 4. Moderate mesenteric edema and small volume ascites.  Cholecystotomy tube placement 08/23         ASSESSMENT & PLAN:   Principal Problem:   Severe sepsis (HCC) Active Problems:   Acute blood loss anemia   Alcohol withdrawal delirium (HCC)   Acute upper GI bleed   Aspiration pneumonia (HCC)   Alcohol withdrawal (HCC)   Nausea & vomiting   Hypophosphatemia   Hypomagnesemia   Hypokalemia   Alcoholic ketoacidosis   PVD (peripheral vascular disease) (HCC)   Transaminitis   Macrocytic anemia   Abdominal aortic aneurysm (AAA) (HCC)   Leukocytosis   Alcohol abuse   Bilirubinemia   Hyponatremia   Arterial hypotension   Acute acalculous cholecystitis   Malnutrition of moderate degree   Severe sepsis (HCC) Acalculus cholecystitis S/p drain  MRCP as above Gen surgery consulted 8/22-- HIDA scan abnormal. Discussed with general surgery. Patient is not a surgical candidate. Recommended gallbladder drain placement.  received 2 days of Unasyn f/b 6 days of cefepime and flagyl.  Transitioned to Augmentin when pt lost IV.  Pt completed 10-day course of abx. cont drain management   Alcohol withdrawal delirium  Dementia suspect alcohol related Received 2 days of high-dose thiamine.   Right now patient does not have capacity make  medical decisions.  confused at baseline however is redirected well.   Acute blood  loss anemia Macrocytic anemia s/p 2u PRBC monitor Hgb and transfuse to keep Hgb >7   Acute upper GI bleed EGD on 8/15 (+)grade D esophagitis, hiatal hernia and angiectasia which was treated with argon plasma coagulation.  cont PPI   Aspiration pneumonia - resolved  on abx, treated    Hypophosphatemia  Hypomagnesemia Hypokalemia monitor and supplement PRN   Alcoholic ketoacidosis Resolved   PVD (peripheral vascular disease)  hold plavix given GI bleed  cont ASA and statin   Transaminitis Secondary to alcohol abuse.   Alkaline phosphatase 205, total bilirubin of 5.1, AST 117, albumin low at 1.6 Monitor periodic CMP    Abdominal aortic aneurysm  No endoleak on CTA on admission. Follow outpatient    Hyponatremia Resolved Monitor BMP   Leukocytosis persistent.  Procal however decreased from 1.64 to 0.73 with abx. Monitor CBC   Poor oral intake refusing all Ensures and eating very little even while staff trying to feed him.  Refusing meds sometimes. started appetite stimulant Marinol, per sister request   Acute urinary retention, intermittent I/O cath removed ~600 ml urine, x2. cont bladder scan q6h, if post-void >500 ml, then I/O cath.  Try to avoid Foley placement since there is a high risk of pt pulling it out. cont Flomax (new) try to position pt upright to help pt void   Current smoker pt persistently asked to go smoke Nicotine patch    DVT prophylaxis: lovenox IV fluids: no continuous IV fluids  Nutrition: regular diet, he has poor po intake  Central lines / invasive devices: cholecystostomy tube   Code Status: DNR ACP documentation reviewed: 04/15/23 MOST form is on file in [facility]  Current Admission Status: inpatient, Med/Surg  LOS: 25 TOC needs: placement Barriers to discharge / significant pending items: placement              Subjective / Brief ROS:  Patient alert today, he is frustrated,asking to be moved to the couch. He is  refusing to eat or take medications   Family Communication: spoke on the phone today w/ his wife 04/15/23 2:42 PM all questions answered      Objective Findings:  Vitals:   04/14/23 2335 04/15/23 0500 04/15/23 0600 04/15/23 0739  BP: (!) 143/93  (!) 125/96 (!) 131/91  Pulse: 96  96 98  Resp: 17  15 (!) 24  Temp:   98.2 F (36.8 C) 97.7 F (36.5 C)  TempSrc: Oral  Oral Oral  SpO2: 99%  96% 97%  Weight:  67.9 kg    Height:        Intake/Output Summary (Last 24 hours) at 04/15/2023 1442 Last data filed at 04/15/2023 1021 Gross per 24 hour  Intake 365 ml  Output 975 ml  Net -610 ml   Filed Weights   03/29/23 0623 03/30/23 1855 04/15/23 0500  Weight: 64.9 kg 65 kg 67.9 kg    Examination:  Physical Exam Constitutional:      General: He is not in acute distress. Cardiovascular:     Rate and Rhythm: Normal rate and regular rhythm.  Pulmonary:     Effort: Pulmonary effort is normal.  Abdominal:     Palpations: Abdomen is soft.  Skin:    General: Skin is warm and dry.  Neurological:     Mental Status: He is alert.  Psychiatric:        Mood and Affect: Mood normal.  Behavior: Behavior normal.          Scheduled Medications:   aspirin EC  81 mg Oral Daily   atorvastatin  40 mg Oral Daily   dronabinol  5 mg Oral QAC lunch   enoxaparin (LOVENOX) injection  40 mg Subcutaneous Q24H   feeding supplement  237 mL Oral TID BM   folic acid  1 mg Oral Daily   magnesium oxide  400 mg Oral BID   melatonin  2.5 mg Oral QHS   multivitamin with minerals  1 tablet Oral Daily   nicotine  14 mg Transdermal Daily   pantoprazole  40 mg Oral Daily   pneumococcal 20-valent conjugate vaccine  0.5 mL Intramuscular Tomorrow-1000   potassium chloride  40 mEq Oral BID   QUEtiapine  12.5 mg Oral QHS   sodium chloride flush  5 mL Intracatheter Q8H   sucralfate  1 g Oral TID WC & HS   tamsulosin  0.4 mg Oral QPC breakfast    Continuous Infusions:  magnesium sulfate bolus IVPB       PRN Medications:  acetaminophen **OR** acetaminophen, chlordiazePOXIDE, ipratropium-albuterol, LORazepam, ondansetron (ZOFRAN) IV, mouth rinse, phenol  Antimicrobials from admission:  Anti-infectives (From admission, onward)    Start     Dose/Rate Route Frequency Ordered Stop   04/06/23 1030  amoxicillin-clavulanate (AUGMENTIN) 875-125 MG per tablet 1 tablet        1 tablet Oral Every 12 hours 04/06/23 0936 04/10/23 0959   04/05/23 1400  piperacillin-tazobactam (ZOSYN) IVPB 3.375 g  Status:  Discontinued        3.375 g 12.5 mL/hr over 240 Minutes Intravenous Every 8 hours 04/05/23 1000 04/06/23 0936   03/30/23 1800  ceFEPIme (MAXIPIME) 2 g in sodium chloride 0.9 % 100 mL IVPB  Status:  Discontinued        2 g 200 mL/hr over 30 Minutes Intravenous Every 8 hours 03/30/23 1235 04/05/23 1000   03/30/23 1000  metroNIDAZOLE (FLAGYL) IVPB 500 mg  Status:  Discontinued        500 mg 100 mL/hr over 60 Minutes Intravenous Every 12 hours 03/30/23 0819 04/05/23 1000   03/30/23 0900  ceFEPIme (MAXIPIME) 2 g in sodium chloride 0.9 % 100 mL IVPB        2 g 200 mL/hr over 30 Minutes Intravenous  Once 03/30/23 0819 03/30/23 1042   03/28/23 1600  Ampicillin-Sulbactam (UNASYN) 3 g in sodium chloride 0.9 % 100 mL IVPB  Status:  Discontinued        3 g 200 mL/hr over 30 Minutes Intravenous Every 6 hours 03/28/23 1442 03/30/23 0819   03/25/23 2000  cefTRIAXone (ROCEPHIN) 1 g in sodium chloride 0.9 % 100 mL IVPB  Status:  Discontinued        1 g 200 mL/hr over 30 Minutes Intravenous Every 24 hours 03/25/23 1837 03/27/23 0719           Data Reviewed:  I have personally reviewed the following...  CBC: Recent Labs  Lab 04/09/23 0403 04/12/23 0342 04/15/23 0526  WBC 15.2* 14.7* 12.4*  HGB 11.3* 9.7* 10.1*  HCT 33.3* 29.3* 31.3*  MCV 94.9 95.4 97.5  PLT 258 298 282   Basic Metabolic Panel: Recent Labs  Lab 04/09/23 0403 04/12/23 0342 04/13/23 0426 04/14/23 0450 04/15/23 0526  NA  145 143  --   --  139  K 3.2* 2.9* 3.2* 3.1* 3.0*  CL 109 112*  --   --  107  CO2 23  22  --   --  24  GLUCOSE 112* 84  --   --  90  BUN 7* 7*  --   --  <5*  CREATININE 0.50* 0.41*  --   --  0.36*  CALCIUM 8.2* 7.4*  --   --  7.2*  MG 1.9 1.5*  --  1.7 1.6*   GFR: Estimated Creatinine Clearance: 91.9 mL/min (A) (by C-G formula based on SCr of 0.36 mg/dL (L)). Liver Function Tests: No results for input(s): "AST", "ALT", "ALKPHOS", "BILITOT", "PROT", "ALBUMIN" in the last 168 hours. No results for input(s): "LIPASE", "AMYLASE" in the last 168 hours. No results for input(s): "AMMONIA" in the last 168 hours. Coagulation Profile: No results for input(s): "INR", "PROTIME" in the last 168 hours. Cardiac Enzymes: No results for input(s): "CKTOTAL", "CKMB", "CKMBINDEX", "TROPONINI" in the last 168 hours. BNP (last 3 results) No results for input(s): "PROBNP" in the last 8760 hours. HbA1C: No results for input(s): "HGBA1C" in the last 72 hours. CBG: No results for input(s): "GLUCAP" in the last 168 hours. Lipid Profile: No results for input(s): "CHOL", "HDL", "LDLCALC", "TRIG", "CHOLHDL", "LDLDIRECT" in the last 72 hours. Thyroid Function Tests: No results for input(s): "TSH", "T4TOTAL", "FREET4", "T3FREE", "THYROIDAB" in the last 72 hours. Anemia Panel: No results for input(s): "VITAMINB12", "FOLATE", "FERRITIN", "TIBC", "IRON", "RETICCTPCT" in the last 72 hours. Most Recent Urinalysis On File:     Component Value Date/Time   COLORURINE AMBER (A) 04/12/2023 1004   APPEARANCEUR CLEAR 04/12/2023 1004   LABSPEC 1.025 04/12/2023 1004   PHURINE 5.0 04/12/2023 1004   GLUCOSEU NEGATIVE 04/12/2023 1004   HGBUR TRACE (A) 04/12/2023 1004   BILIRUBINUR MODERATE (A) 04/12/2023 1004   KETONESUR TRACE (A) 04/12/2023 1004   PROTEINUR 30 (A) 04/12/2023 1004   NITRITE POSITIVE (A) 04/12/2023 1004   LEUKOCYTESUR NEGATIVE 04/12/2023 1004   Sepsis  Labs: @LABRCNTIP (procalcitonin:4,lacticidven:4) Microbiology: Recent Results (from the past 240 hour(s))  Urine Culture (for pregnant, neutropenic or urologic patients or patients with an indwelling urinary catheter)     Status: None   Collection Time: 04/12/23  2:45 PM   Specimen: In/Out Cath Urine  Result Value Ref Range Status   Specimen Description   Final    IN/OUT CATH URINE Performed at [facility], [address], [place], [place] [address]    Special Requests   Final    NONE Performed at [facility], [address] [place], [place] [address]    Culture   Final    NO GROWTH Performed at [facility], [address], [place], [place] [address]    Report Status 04/13/2023 FINAL  Final      Radiology Studies last 3 days: CT HEAD WO CONTRAST ( )  Result Date: 04/15/2023 CLINICAL DATA:  Initial evaluation for acute head trauma, minor. EXAM: CT HEAD WITHOUT CONTRAST TECHNIQUE: Contiguous axial images were obtained from the base of the skull through the vertex without intravenous contrast. RADIATION DOSE REDUCTION: This exam was performed according to the departmental dose-optimization program which includes automated exposure control, adjustment of the mA and/or kV according to patient size and/or use of iterative reconstruction technique. COMPARISON:  Prior study from 05/20/2021. FINDINGS: Brain: Age-related cerebral atrophy with moderate chronic microvascular ischemic disease. No acute intracranial hemorrhage. No acute large vessel territory infarct. No mass lesion, midline shift or mass effect. No hydrocephalus or extra-axial fluid collection. Vascular: No abnormal hyperdense vessel. Scattered vascular calcifications noted within the carotid siphons. Skull: Scalp soft  tissues demonstrate no acute finding. Calvarium intact. Sinuses/Orbits: Globes and orbital soft tissues within normal limits. Visualized paranasal sinuses and mastoid air cells are clear.  Other: None. IMPRESSION: 1. No acute intracranial abnormality. 2. Age-related cerebral atrophy with moderate chronic microvascular ischemic disease. Electronically Signed   By: Rise Mu M.D.   On: 04/15/2023 00:04             LOS: 25 days      Sunnie Nielsen, DO [facility] 04/15/2023, 2:42 PM    Dictation software may have been used to generate the above note. Typos may occur and escape review in typed/dictated notes. Please contact Dr Lyn Hollingshead directly for clarity if needed.  Staff may message me via secure chat in Epic  but this may not receive an immediate response,  please page me for urgent matters!  If 7PM-7AM, please contact night coverage [email or web address]

## 2023-04-16 LAB — GLUCOSE, CAPILLARY: Glucose-Capillary: 73 mg/dL (ref 70–99)

## 2023-04-16 LAB — MAGNESIUM: Magnesium: 1.5 mg/dL — ABNORMAL LOW (ref 1.7–2.4)

## 2023-04-16 NOTE — TOC Progression Note (Addendum)
Transition of Care [facility]) - Progression Note    Patient Details  Name: Ryan Rose MRN: [id number] Date of Birth: 06-11-1961  Transition of Care [facility]) CM/SW Contact  Chapman Fitch, RN Phone Number: 04/16/2023, 9:19 AM  Clinical Narrative:     Sherron Monday with Baird Lyons with [facility] she is to review patient for [facility] and [facility]      Will have to have accepting facility prior to LOG being completed   Expected Discharge Plan and Services                                               Social Determinants of Health (SDOH) Interventions SDOH Screenings   Food Insecurity: Patient Declined (03/21/2023)  Housing: Patient Declined (03/21/2023)  Transportation Needs: No Transportation Needs (03/21/2023)  Utilities: Not At Risk (03/21/2023)  Tobacco Use: High Risk (04/01/2023)    Readmission Risk Interventions     No data to display

## 2023-04-16 NOTE — Plan of Care (Signed)
  Problem: Elimination: Goal: Will not experience complications related to bowel motility Outcome: Progressing Goal: Will not experience complications related to urinary retention Outcome: Progressing   Problem: Activity: Goal: Risk for activity intolerance will decrease Outcome: Progressing

## 2023-04-16 NOTE — Progress Notes (Signed)
PROGRESS NOTE    Ryan Rose   YNW:[id number] DOB: 01-01-1961  DOA: 03/21/2023 Date of Service: 04/16/23 PCP: Sherron Monday, MD     Brief Narrative / Hospital Course:  Ryan Rose is a 62 y.o. male with medical history significant of AAA status post repair and stenting, alcohol abuse, CAD, HTN, PVD, presented with persistent nauseous vomiting chest pain abdominal pain x1 day. He admitted he drinks occasionally and last drink was 2 days ago.  Significant hospital events:  08/12: admitted to hospitalist service. Aortic dissection r/o, CTA (+)other concerns w/ esophageal thickening, chronic diverticulitis, severe diffuse hepatic steatosis.  08/15: Patient had upper endoscopy done which showed grade D esophagitis, large hiatal hernia and single nonbleeding angiectasia in the stomach treated with argon plasma. 08/16: Patient's hemoglobin dipped down to 6.5. Transfused 1 unit PRBC 08/18: Chest x-ray possible aspiration pneumonia.  Started on Unasyn. 08/20: Fever, hypotensive, tachycardia.  Patient given sepsis fluid bolus.  Antibiotics changed over to Maxipime and Flagyl.  MRCP ordered.  In the afternoon and blood pressure dropped down into the 70s and not taking midodrine, transfer to the stepdown for closer monitoring. 08/23: cholecystostomy tube placement  Since then and to today 04/16/23 - difficult placement, concern for dementia and behavioral difficulty d/t this. Intermittent urinary retention, avoiding Foley d/t confusion and pt has removed IV lines etc. Has not been eating much, not good candidate for feeding tube, palliative care following peripherally.     Consultants:  Gastroenterology  [facility] General Surgery Palliative Care Interventional Radiology   Procedures:  Upper endoscopy 08/15 which showed grade D esophagitis, large hiatal hernia and single nonbleeding angiectasia in the stomach treated with argon plasma.   MRCP 08/20--Severe diffuse fatty  infiltration of the liver. No focal hepatic lesions are identified without contrast. 2. Distended gallbladder with layering sludge. No definite gallstones. Gallbladder wall thickening and pericholecystic inflammatory changes could suggest acalculus cholecystitis or cholestasis. Normal caliber and course of the common bile duct. 3. Small bilateral pleural effusions with overlying atelectasis. There is also a cystic appearing fluid collection near the distal esophagus on the right side. This was not present on the prior CT scan and could be loculated pleural fluid. 4. Moderate mesenteric edema and small volume ascites.  Cholecystotomy tube placement 08/23         ASSESSMENT & PLAN:   Principal Problem:   Severe sepsis (HCC) Active Problems:   Acute blood loss anemia   Alcohol withdrawal delirium (HCC)   Acute upper GI bleed   Aspiration pneumonia (HCC)   Alcohol withdrawal (HCC)   Nausea & vomiting   Hypophosphatemia   Hypomagnesemia   Hypokalemia   Alcoholic ketoacidosis   PVD (peripheral vascular disease) (HCC)   Transaminitis   Macrocytic anemia   Abdominal aortic aneurysm (AAA) (HCC)   Leukocytosis   Alcohol abuse   Bilirubinemia   Hyponatremia   Arterial hypotension   Acute acalculous cholecystitis   Malnutrition of moderate degree   Severe sepsis (HCC) Acalculus cholecystitis S/p drain  MRCP as above Gen surgery consulted 8/22-- HIDA scan abnormal. Discussed with general surgery. Patient is not a surgical candidate. Recommended gallbladder drain placement.  received 2 days of Unasyn f/b 6 days of cefepime and flagyl.  Transitioned to Augmentin when pt lost IV.  Pt completed 10-day course of abx. cont drain management   Alcohol withdrawal delirium  Dementia suspect alcohol related Received 2 days of high-dose thiamine.   Right now patient does not have capacity make  medical decisions.  confused at baseline however is redirected well.   Acute blood  loss anemia Macrocytic anemia s/p 2u PRBC monitor Hgb and transfuse to keep Hgb >7   Acute upper GI bleed EGD on 8/15 (+)grade D esophagitis, hiatal hernia and angiectasia which was treated with argon plasma coagulation.  cont PPI   Aspiration pneumonia - resolved  on abx, treated    Hypophosphatemia  Hypomagnesemia Hypokalemia monitor and supplement PRN   Alcoholic ketoacidosis Resolved   PVD (peripheral vascular disease)  hold plavix given GI bleed  cont ASA and statin   Transaminitis Secondary to alcohol abuse.   Alkaline phosphatase 205, total bilirubin of 5.1, AST 117, albumin low at 1.6 Monitor periodic CMP    Abdominal aortic aneurysm  No endoleak on CTA on admission. Follow outpatient    Hyponatremia Resolved Monitor BMP   Leukocytosis persistent.  Procal however decreased from 1.64 to 0.73 with abx. Monitor CBC   Poor oral intake refusing all Ensures and eating very little even while staff trying to feed him.  Refusing meds sometimes. started appetite stimulant Marinol, per sister request   Acute urinary retention, intermittent I/O cath removed ~600 ml urine, x2. cont bladder scan q6h, if post-void >500 ml, then I/O cath.  Try to avoid Foley placement since there is a high risk of pt pulling it out. cont Flomax (new) try to position pt upright to help pt void   Current smoker pt persistently asked to go smoke Nicotine patch    DVT prophylaxis: lovenox IV fluids: no continuous IV fluids  Nutrition: regular diet, he has poor po intake  Central lines / invasive devices: cholecystostomy tube   Code Status: DNR ACP documentation reviewed: 04/15/23 MOST form is on [facility]  Current Admission Status: inpatient, Med/Surg  LOS: 26 TOC needs: placement Barriers to discharge / significant pending items: placement              Subjective / Brief ROS:  Patient alert today, no concerns   Family Communication: none at this time      Objective Findings:  Vitals:   04/15/23 0739 04/15/23 2017 04/16/23 0454 04/16/23 0806  BP: (!) 131/91 106/79 108/78 110/73  Pulse: 98 (!) 106 (!) 110 (!) 102  Resp: (!) 24 20 18 18   Temp: 97.7 F (36.5 C) 98.4 F (36.9 C) 98.3 F (36.8 C) 98.5 F (36.9 C)  TempSrc: Oral Oral Oral   SpO2: 97% 92% 98% 95%  Weight:      Height:        Intake/Output Summary (Last 24 hours) at 04/16/2023 1301 Last data filed at 04/16/2023 1207 Gross per 24 hour  Intake 40 ml  Output 165 ml  Net -125 ml   Filed Weights   03/29/23 0623 03/30/23 1855 04/15/23 0500  Weight: 64.9 kg 65 kg 67.9 kg    Examination:  Physical Exam Constitutional:      General: He is not in acute distress. Cardiovascular:     Rate and Rhythm: Normal rate and regular rhythm.  Pulmonary:     Effort: Pulmonary effort is normal.  Abdominal:     Palpations: Abdomen is soft.  Skin:    General: Skin is warm and dry.  Neurological:     Mental Status: He is alert.  Psychiatric:        Mood and Affect: Mood normal.        Behavior: Behavior normal.          Scheduled  Medications:   aspirin EC  81 mg Oral Daily   atorvastatin  40 mg Oral Daily   dronabinol  5 mg Oral QAC lunch   enoxaparin (LOVENOX) injection  40 mg Subcutaneous Q24H   feeding supplement  237 mL Oral TID BM   folic acid  1 mg Oral Daily   magnesium oxide  400 mg Oral BID   melatonin  2.5 mg Oral QHS   multivitamin with minerals  1 tablet Oral Daily   nicotine  14 mg Transdermal Daily   pantoprazole  40 mg Oral Daily   pneumococcal 20-valent conjugate vaccine  0.5 mL Intramuscular Tomorrow-1000   QUEtiapine  12.5 mg Oral QHS   sodium chloride flush  5 mL Intracatheter Q8H   sucralfate  1 g Oral TID WC & HS   tamsulosin  0.4 mg Oral QPC breakfast    Continuous Infusions:    PRN Medications:  acetaminophen **OR** acetaminophen, chlordiazePOXIDE, ipratropium-albuterol, LORazepam, ondansetron (ZOFRAN) IV, mouth rinse,  phenol  Antimicrobials from admission:  Anti-infectives (From admission, onward)    Start     Dose/Rate Route Frequency Ordered Stop   04/06/23 1030  amoxicillin-clavulanate (AUGMENTIN) 875-125 MG per tablet 1 tablet        1 tablet Oral Every 12 hours 04/06/23 0936 04/10/23 0959   04/05/23 1400  piperacillin-tazobactam (ZOSYN) IVPB 3.375 g  Status:  Discontinued        3.375 g 12.5 mL/hr over 240 Minutes Intravenous Every 8 hours 04/05/23 1000 04/06/23 0936   03/30/23 1800  ceFEPIme (MAXIPIME) 2 g in sodium chloride 0.9 % 100 mL IVPB  Status:  Discontinued        2 g 200 mL/hr over 30 Minutes Intravenous Every 8 hours 03/30/23 1235 04/05/23 1000   03/30/23 1000  metroNIDAZOLE (FLAGYL) IVPB 500 mg  Status:  Discontinued        500 mg 100 mL/hr over 60 Minutes Intravenous Every 12 hours 03/30/23 0819 04/05/23 1000   03/30/23 0900  ceFEPIme (MAXIPIME) 2 g in sodium chloride 0.9 % 100 mL IVPB        2 g 200 mL/hr over 30 Minutes Intravenous  Once 03/30/23 0819 03/30/23 1042   03/28/23 1600  Ampicillin-Sulbactam (UNASYN) 3 g in sodium chloride 0.9 % 100 mL IVPB  Status:  Discontinued        3 g 200 mL/hr over 30 Minutes Intravenous Every 6 hours 03/28/23 1442 03/30/23 0819   03/25/23 2000  cefTRIAXone (ROCEPHIN) 1 g in sodium chloride 0.9 % 100 mL IVPB  Status:  Discontinued        1 g 200 mL/hr over 30 Minutes Intravenous Every 24 hours 03/25/23 1837 03/27/23 0719           Data Reviewed:  I have personally reviewed the following...  CBC: Recent Labs  Lab 04/12/23 0342 04/15/23 0526  WBC 14.7* 12.4*  HGB 9.7* 10.1*  HCT 29.3* 31.3*  MCV 95.4 97.5  PLT 298 282   Basic Metabolic Panel: Recent Labs  Lab 04/12/23 0342 04/13/23 0426 04/14/23 0450 04/15/23 0526 04/16/23 0555  NA 143  --   --  139  --   K 2.9* 3.2* 3.1* 3.0*  --   CL 112*  --   --  107  --   CO2 22  --   --  24  --   GLUCOSE 84  --   --  90  --   BUN 7*  --   --  <  5*  --   CREATININE 0.41*  --   --   0.36*  --   CALCIUM 7.4*  --   --  7.2*  --   MG 1.5*  --  1.7 1.6* 1.5*   GFR: Estimated Creatinine Clearance: 91.9 mL/min (A) (by C-G formula based on SCr of 0.36 mg/dL (L)). Liver Function Tests: No results for input(s): "AST", "ALT", "ALKPHOS", "BILITOT", "PROT", "ALBUMIN" in the last 168 hours. No results for input(s): "LIPASE", "AMYLASE" in the last 168 hours. No results for input(s): "AMMONIA" in the last 168 hours. Coagulation Profile: No results for input(s): "INR", "PROTIME" in the last 168 hours. Cardiac Enzymes: No results for input(s): "CKTOTAL", "CKMB", "CKMBINDEX", "TROPONINI" in the last 168 hours. BNP (last 3 results) No results for input(s): "PROBNP" in the last 8760 hours. HbA1C: No results for input(s): "HGBA1C" in the last 72 hours. CBG: Recent Labs  Lab 04/16/23 0423  GLUCAP 73   Lipid Profile: No results for input(s): "CHOL", "HDL", "LDLCALC", "TRIG", "CHOLHDL", "LDLDIRECT" in the last 72 hours. Thyroid Function Tests: No results for input(s): "TSH", "T4TOTAL", "FREET4", "T3FREE", "THYROIDAB" in the last 72 hours. Anemia Panel: No results for input(s): "VITAMINB12", "FOLATE", "FERRITIN", "TIBC", "IRON", "RETICCTPCT" in the last 72 hours. Most Recent Urinalysis On File:     Component Value Date/Time   COLORURINE AMBER (A) 04/12/2023 1004   APPEARANCEUR CLEAR 04/12/2023 1004   LABSPEC 1.025 04/12/2023 1004   PHURINE 5.0 04/12/2023 1004   GLUCOSEU NEGATIVE 04/12/2023 1004   HGBUR TRACE (A) 04/12/2023 1004   BILIRUBINUR MODERATE (A) 04/12/2023 1004   KETONESUR TRACE (A) 04/12/2023 1004   PROTEINUR 30 (A) 04/12/2023 1004   NITRITE POSITIVE (A) 04/12/2023 1004   LEUKOCYTESUR NEGATIVE 04/12/2023 1004   Sepsis Labs: @LABRCNTIP (procalcitonin:4,lacticidven:4) Microbiology: Recent Results (from the past 240 hour(s))  Urine Culture (for pregnant, neutropenic or urologic patients or patients with an indwelling urinary catheter)     Status: None    Collection Time: 04/12/23  2:45 PM   Specimen: In/Out Cath Urine  Result Value Ref Range Status   Specimen Description   Final    IN/OUT CATH URINE Performed at [facility], [address], [place], [place] [address]    Special Requests   Final    NONE Performed at [facility], [address] [place], [place] [address]    Culture   Final    NO GROWTH Performed at [facility], [address] [place], [place] [address]    Report Status 04/13/2023 FINAL  Final      Radiology Studies last 3 days: CT HEAD WO CONTRAST ( )  Result Date: 04/15/2023 CLINICAL DATA:  Initial evaluation for acute head trauma, minor. EXAM: CT HEAD WITHOUT CONTRAST TECHNIQUE: Contiguous axial images were obtained from the base of the skull through the vertex without intravenous contrast. RADIATION DOSE REDUCTION: This exam was performed according to the departmental dose-optimization program which includes automated exposure control, adjustment of the mA and/or kV according to patient size and/or use of iterative reconstruction technique. COMPARISON:  Prior study from 05/20/2021. FINDINGS: Brain: Age-related cerebral atrophy with moderate chronic microvascular ischemic disease. No acute intracranial hemorrhage. No acute large vessel territory infarct. No mass lesion, midline shift or mass effect. No hydrocephalus or extra-axial fluid collection. Vascular: No abnormal hyperdense vessel. Scattered vascular calcifications noted within the carotid siphons. Skull: Scalp soft tissues demonstrate no acute finding. Calvarium intact. Sinuses/Orbits: Globes and orbital soft tissues within normal limits. Visualized paranasal sinuses and  mastoid air cells are clear. Other: None. IMPRESSION: 1. No acute intracranial abnormality. 2. Age-related cerebral atrophy with moderate chronic microvascular ischemic disease. Electronically Signed   By: Rise Mu M.D.   On: 04/15/2023 00:04              LOS: 26 days      Sunnie Nielsen, DO [facility] 04/16/2023, 1:01 PM    Dictation software may have been used to generate the above note. Typos may occur and escape review in typed/dictated notes. Please contact Dr Lyn Hollingshead directly for clarity if needed.  Staff may message me via secure chat in Epic  but this may not receive an immediate response,  please page me for urgent matters!  If 7PM-7AM, please contact night coverage [email or web address]

## 2023-04-16 NOTE — Progress Notes (Signed)
Physical Therapy Treatment Patient Details Name: Ryan Rose MRN: [id number] DOB: March 09, 1961 Today's Date: 04/16/2023   History of Present Illness Pt is a 62 y/o M admitted on 03/21/23 after presenting with c/c of N&V, chest & abdominal pain. Pt is being treated for alcohol withdrawal delirium, ABLA, acute upper GI bleed. PMH: AAA s/p repair & stenting, alcohol abuse, CAD, HTN, PVD    PT Comments  Pt received in bed. Discussed role PT and treatment session goals. Pt agreed to sit EOB to self feed his lunch for ~30 minutes. Pt able to self correct during minimal LOB while reaching out of BOS. Attempted to stand from bed to transfer to chair with MaxA, pt however resisted full standing and declined transferring to chair. Overall, pt was cooperative, slightly emotional as he discussed his family. Will continue to progress as able.    If plan is discharge home, recommend the following: Two people to help with bathing/dressing/bathroom;Two people to help with walking and/or transfers;Help with stairs or ramp for entrance;Assist for transportation;Assistance with cooking/housework;Assistance with feeding;Direct supervision/assist for financial management;Direct supervision/assist for medications management;Supervision due to cognitive status   Can travel by private vehicle     No  Equipment Recommendations  Other (comment) (TBD at next level of care)    Recommendations for Other Services       Precautions / Restrictions Precautions Precautions: Fall Restrictions Weight Bearing Restrictions: No     Mobility  Bed Mobility Overal bed mobility: Needs Assistance Bed Mobility: Supine to Sit, Sit to Supine Rolling: Max assist   Supine to sit: Max assist Sit to supine: Max assist   General bed mobility comments: Pt requires increased time and prefers to mobilize on his terms. Pt requires therapists full attention and engagement to participate    Transfers Overall transfer level: Needs  assistance Equipment used: None Transfers: Sit to/from Stand Sit to Stand: Max assist           General transfer comment: Pt resisted full standing upon attempt.    Ambulation/Gait               General Gait Details: Not performed secondary to safety concerns   Stairs             Wheelchair Mobility     Tilt Bed    Modified Rankin (Stroke Patients Only)       Balance Overall balance assessment: Needs assistance Sitting-balance support: No upper extremity supported, Feet supported Sitting balance-Leahy Scale: Fair Sitting balance - Comments:  (Pt sat EOB for ~30 minutes)       Standing balance comment: Unable to assess at this time                            Cognition Arousal: Alert Behavior During Therapy: WFL for tasks assessed/performed (Easily agitated) Overall Cognitive Status: History of cognitive impairments - at baseline Area of Impairment: Orientation, Attention, Memory, Following commands, Safety/judgement, Problem solving, Awareness                 Orientation Level: Disoriented to, Place, Time, Situation Current Attention Level: Focused Memory: Decreased recall of precautions, Decreased short-term memory Following Commands: Follows one step commands inconsistently, Follows one step commands with increased time Safety/Judgement: Decreased awareness of safety, Decreased awareness of deficits Awareness: Intellectual Problem Solving: Decreased initiation, Difficulty sequencing, Requires verbal cues, Requires tactile cues, Slow processing General Comments: Pt participated with increased time  Exercises Total Joint Exercises Ankle Circles/Pumps: AAROM, Both, 5 reps, Supine Heel Slides: AAROM, Both, 5 reps, Supine Other Exercises Other Exercises: Sitting EOB while reaching out of BOS and maintaining sitting balance with Supervision/CGA    General Comments General comments (skin integrity, edema, etc.): Periwick  and Biliary tube intact      Pertinent Vitals/Pain Pain Assessment Pain Assessment: No/denies pain    Home Living                          Prior Function            PT Goals (current goals can now be found in the care plan section) Acute Rehab PT Goals Patient Stated Goal: wants to go home    Frequency    Min 1X/week      PT Plan      Co-evaluation              AM-PAC PT "6 Clicks" Mobility   Outcome Measure  Help needed turning from your back to your side while in a flat bed without using bedrails?: A Lot Help needed moving from lying on your back to sitting on the side of a flat bed without using bedrails?: A Lot Help needed moving to and from a bed to a chair (including a wheelchair)?: Total Help needed standing up from a chair using your arms (e.g., wheelchair or bedside chair)?: Total Help needed to walk in hospital room?: Total Help needed climbing 3-5 steps with a railing? : Total 6 Click Score: 8    End of Session   Activity Tolerance: Patient tolerated treatment well Patient left: in bed;with bed alarm set Nurse Communication: Mobility status PT Visit Diagnosis: Muscle weakness (generalized) (M62.81);Difficulty in walking, not elsewhere classified (R26.2);Other abnormalities of gait and mobility (R26.89)     Time: 1352-1440 PT Time Calculation (min) (ACUTE ONLY): 48 min  Charges:    $Therapeutic Exercise: 8-22 mins $Therapeutic Activity: 23-37 mins PT General Charges $$ ACUTE PT VISIT: 1 Visit                    Zadie Cleverly, PTA  Jannet Askew 04/16/2023, 3:57 PM

## 2023-04-17 DIAGNOSIS — A419 Sepsis, unspecified organism: Secondary | ICD-10-CM | POA: Diagnosis not present

## 2023-04-17 DIAGNOSIS — R6520 Severe sepsis without septic shock: Secondary | ICD-10-CM | POA: Diagnosis not present

## 2023-04-17 LAB — BASIC METABOLIC PANEL
Anion gap: 9 (ref 5–15)
BUN: 6 mg/dL — ABNORMAL LOW (ref 8–23)
CO2: 21 mmol/L — ABNORMAL LOW (ref 22–32)
Calcium: 7.1 mg/dL — ABNORMAL LOW (ref 8.9–10.3)
Chloride: 107 mmol/L (ref 98–111)
Creatinine, Ser: <0.3 mg/dL — ABNORMAL LOW (ref 0.61–1.24)
Glucose, Bld: 92 mg/dL (ref 70–99)
Potassium: 3.5 mmol/L (ref 3.5–5.1)
Sodium: 137 mmol/L (ref 135–145)

## 2023-04-17 LAB — MAGNESIUM: Magnesium: 1.6 mg/dL — ABNORMAL LOW (ref 1.7–2.4)

## 2023-04-17 MED ORDER — GLYCOPYRROLATE 0.2 MG/ML IJ SOLN: 0.2000 mg | INTRAMUSCULAR | Status: DC | PRN

## 2023-04-17 MED ORDER — ONDANSETRON 4 MG PO TBDP: 4.0000 mg | ORAL_TABLET | Freq: Four times a day (QID) | ORAL | Status: DC | PRN

## 2023-04-17 MED ORDER — GLYCOPYRROLATE 1 MG PO TABS: 1.0000 mg | ORAL_TABLET | ORAL | Status: DC | PRN

## 2023-04-17 MED ORDER — PROMETHAZINE HCL 25 MG/ML IJ SOLN: 12.5000 mg | Freq: Four times a day (QID) | INTRAMUSCULAR | Status: DC | PRN

## 2023-04-17 MED ORDER — POLYVINYL ALCOHOL 1.4 % OP SOLN: 1.0000 [drp] | Freq: Four times a day (QID) | OPHTHALMIC | Status: DC | PRN

## 2023-04-17 MED ORDER — ZIPRASIDONE MESYLATE 20 MG IM SOLR: 10.0000 mg | Freq: Two times a day (BID) | INTRAMUSCULAR | Status: DC | PRN

## 2023-04-17 NOTE — Progress Notes (Signed)
PROGRESS NOTE    Ryan Rose   JYN:[id number] DOB: 11/03/1960  DOA: 03/21/2023 Date of Service: 04/17/23 PCP: Sherron Monday, MD     Brief Narrative / Hospital Course:  Ryan Rose is a 62 y.o. male with medical history significant of AAA status post repair and stenting, alcohol abuse, CAD, HTN, PVD, presented with persistent nauseous vomiting chest pain abdominal pain x1 day. He admitted he drinks occasionally and last drink was 2 days PTA.  Significant hospital events:  08/12: admitted to hospitalist service. Aortic dissection r/o, CTA (+)other concerns w/ esophageal thickening, chronic diverticulitis, severe diffuse hepatic steatosis.  08/15: Patient had upper endoscopy done which showed grade D esophagitis, large hiatal hernia and single nonbleeding angiectasia in the stomach treated with argon plasma. 08/16: Patient's hemoglobin dipped down to 6.5. Transfused 1 unit PRBC 08/18: Chest x-ray possible aspiration pneumonia.  Started on Unasyn. 08/20: Fever, hypotensive, tachycardia.  Patient given sepsis fluid bolus.  Antibiotics changed over to Maxipime and Flagyl.  MRCP ordered.  In the afternoon and blood pressure dropped down into the 70s and not taking midodrine, transfer to the stepdown for closer monitoring. 08/23: cholecystostomy tube placement  Since then and to 04/16/23 - difficult placement, concern for dementia and behavioral difficulty d/t this. Intermittent urinary retention, avoiding Foley d/t confusion and pt has removed IV lines etc. Has not been eating much, not good candidate for feeding tube, palliative care following peripherally.  09/07: decision made for comfort measures status with his wife     Consultants:  Gastroenterology  [facility] General Surgery Palliative Care Interventional Radiology   Procedures:  Upper endoscopy 08/15 which showed grade D esophagitis, large hiatal hernia and single nonbleeding angiectasia in the stomach treated with  argon plasma.   MRCP 08/20--Severe diffuse fatty infiltration of the liver. No focal hepatic lesions are identified without contrast. 2. Distended gallbladder with layering sludge. No definite gallstones. Gallbladder wall thickening and pericholecystic inflammatory changes could suggest acalculus cholecystitis or cholestasis. Normal caliber and course of the common bile duct. 3. Small bilateral pleural effusions with overlying atelectasis. There is also a cystic appearing fluid collection near the distal esophagus on the right side. This was not present on the prior CT scan and could be loculated pleural fluid. 4. Moderate mesenteric edema and small volume ascites.  Cholecystotomy tube placement 08/23         ASSESSMENT & PLAN:   Principal Problem:   Severe sepsis (HCC) Active Problems:   Acute blood loss anemia   Alcohol withdrawal delirium (HCC)   Acute upper GI bleed   Aspiration pneumonia (HCC)   Alcohol withdrawal (HCC)   Nausea & vomiting   Hypophosphatemia   Hypomagnesemia   Hypokalemia   Alcoholic ketoacidosis   PVD (peripheral vascular disease) (HCC)   Transaminitis   Macrocytic anemia   Abdominal aortic aneurysm (AAA) (HCC)   Leukocytosis   Alcohol abuse   Bilirubinemia   Hyponatremia   Arterial hypotension   Acute acalculous cholecystitis   Malnutrition of moderate degree   Advanced care planning Discussion today over the phone w/ his wife I updated her that he is still refusing to eat, refusing medications. Likely this is due to his dementia, at this point would consider impact on his functional status to be irreversible and progressive, essentially terminal. His rehabilitation potential is very poor  He does not have capacity to make decisions or understand his situation Options now 1) Option to force treatment w/ IV fluids and feeding tube,  but I suspect this will be a temporary stop-gap and he will pull lines/tubes or if we are eventually able to  remove them he will still not eat or drink and we will be right back in the same situation.  2) Option to focus on symptom management and comfort, and to do what we can to facilitate patient's stated goals for QOL which is, essentially, to not be bothered with lines, blood draws, tubes, aggressive measures, etc.  His wife, [name], asks that we make him comfortable and not do extra blood draws or place any IV lines. If he eats or drinks when offered, that is fine but will not force feed or force hydration artificially.  Have updated to comfort measures status Will seek hospice placement instead of SNF rehab  Severe sepsis (HCC) Acalculus cholecystitis S/p drain  cont drain management   Alcohol withdrawal delirium  Dementia suspect alcohol related Received 2 days of high-dose thiamine.   Right now patient does not have capacity make medical decisions.  confused at baseline however is redirected well. Comfort measures   Acute blood loss anemia Macrocytic anemia s/p 2u PRBC Stopping blood draws    Acute upper GI bleed EGD on 8/15 (+)grade D esophagitis, hiatal hernia and angiectasia which was treated with argon plasma coagulation.  cont PPI to prevent GERD, if he will take pills    Aspiration pneumonia - resolved  on abx, treated    Hypophosphatemia  Hypomagnesemia Hypokalemia Leukocytosis  Transaminitis  Stopping blood draws    Alcoholic ketoacidosis Resolved   PVD (peripheral vascular disease)  Stopping medications  Abdominal aortic aneurysm  No endoleak on CTA on admission. Comfort measures   Acute urinary retention, intermittent I/O cath removed ~600 ml urine, x2. cont bladder scan q6h, if post-void >500 ml, then I/O cath.  Try to avoid Foley placement since there is a high risk of pt pulling it out. cont Flomax  fi he will take it  try to position pt upright to help pt void   Poor oral intake refusing all Ensures and eating very little even while staff trying to  feed him.  Refusing meds sometimes. started appetite stimulant Marinol, per sister request   Current smoker pt persistently asked to go smoke Nicotine patch    DVT prophylaxis: lovenox to d/c o comfort measures IV fluids: no continuous IV fluids  Nutrition: regular diet, he has poor po intake  Central lines / invasive devices: cholecystostomy tube   Code Status: DNR ACP documentation reviewed: 04/15/23 MOST form is on file in [facility]  Current Admission Status: inpatient, Med/Surg  LOS: 27 TOC needs: placement Barriers to discharge / significant pending items: placement              Subjective / Brief ROS:  Patient alert today, no concerns   Family Communication: spoke w/ wife 04/17/23 4:12 PM on the phone, see advanced care planning A/P     Objective Findings:  Vitals:   04/16/23 2338 04/17/23 0335 04/17/23 0500 04/17/23 0758  BP: 116/80 118/79  (!) 118/90  Pulse: (!) 104 (!) 105  (!) 105  Resp: 18 20  16   Temp: 98 F (36.7 C) 98.7 F (37.1 C)  98 F (36.7 C)  TempSrc:    Oral  SpO2: 100%   100%  Weight:   61.8 kg   Height:        Intake/Output Summary (Last 24 hours) at 04/17/2023 1612 Last data filed at 04/17/2023 1118 Gross per 24 hour  Intake 240 ml  Output 260 ml  Net -20 ml   Filed Weights   03/30/23 1855 04/15/23 0500 04/17/23 0500  Weight: 65 kg 67.9 kg 61.8 kg    Examination:  Physical Exam Constitutional:      General: He is not in acute distress. Cardiovascular:     Rate and Rhythm: Normal rate and regular rhythm.  Pulmonary:     Effort: Pulmonary effort is normal.  Abdominal:     Palpations: Abdomen is soft.  Skin:    General: Skin is warm and dry.  Neurological:     Mental Status: He is alert.  Psychiatric:        Mood and Affect: Mood normal.        Behavior: Behavior normal.          Scheduled Medications:   feeding supplement  237 mL Oral TID BM   melatonin  2.5 mg Oral QHS   nicotine  14 mg Transdermal Daily    pantoprazole  40 mg Oral Daily   pneumococcal 20-valent conjugate vaccine  0.5 mL Intramuscular Tomorrow-1000   QUEtiapine  12.5 mg Oral QHS   sodium chloride flush  5 mL Intracatheter Q8H   sucralfate  1 g Oral TID WC & HS   tamsulosin  0.4 mg Oral QPC breakfast    Continuous Infusions:    PRN Medications:  acetaminophen **OR** acetaminophen, glycopyrrolate **OR** glycopyrrolate, ipratropium-albuterol, LORazepam, ondansetron (ZOFRAN) IV, ondansetron, mouth rinse, phenol, polyvinyl alcohol, promethazine (PHENERGAN) injection (IM or IVPB), ziprasidone  Antimicrobials from admission:  Anti-infectives (From admission, onward)    Start     Dose/Rate Route Frequency Ordered Stop   04/06/23 1030  amoxicillin-clavulanate (AUGMENTIN) 875-125 MG per tablet 1 tablet        1 tablet Oral Every 12 hours 04/06/23 0936 04/10/23 0959   04/05/23 1400  piperacillin-tazobactam (ZOSYN) IVPB 3.375 g  Status:  Discontinued        3.375 g 12.5 mL/hr over 240 Minutes Intravenous Every 8 hours 04/05/23 1000 04/06/23 0936   03/30/23 1800  ceFEPIme (MAXIPIME) 2 g in sodium chloride 0.9 % 100 mL IVPB  Status:  Discontinued        2 g 200 mL/hr over 30 Minutes Intravenous Every 8 hours 03/30/23 1235 04/05/23 1000   03/30/23 1000  metroNIDAZOLE (FLAGYL) IVPB 500 mg  Status:  Discontinued        500 mg 100 mL/hr over 60 Minutes Intravenous Every 12 hours 03/30/23 0819 04/05/23 1000   03/30/23 0900  ceFEPIme (MAXIPIME) 2 g in sodium chloride 0.9 % 100 mL IVPB        2 g 200 mL/hr over 30 Minutes Intravenous  Once 03/30/23 0819 03/30/23 1042   03/28/23 1600  Ampicillin-Sulbactam (UNASYN) 3 g in sodium chloride 0.9 % 100 mL IVPB  Status:  Discontinued        3 g 200 mL/hr over 30 Minutes Intravenous Every 6 hours 03/28/23 1442 03/30/23 0819   03/25/23 2000  cefTRIAXone (ROCEPHIN) 1 g in sodium chloride 0.9 % 100 mL IVPB  Status:  Discontinued        1 g 200 mL/hr over 30 Minutes Intravenous Every 24 hours  03/25/23 1837 03/27/23 0719           Data Reviewed:  I have personally reviewed the following...  CBC: Recent Labs  Lab 04/12/23 0342 04/15/23 0526  WBC 14.7* 12.4*  HGB 9.7* 10.1*  HCT 29.3* 31.3*  MCV 95.4 97.5  PLT  298 282   Basic Metabolic Panel: Recent Labs  Lab 04/12/23 0342 04/13/23 0426 04/14/23 0450 04/15/23 0526 04/16/23 0555 04/17/23 0454 04/17/23 0455  NA 143  --   --  139  --  137  --   K 2.9* 3.2* 3.1* 3.0*  --  3.5  --   CL 112*  --   --  107  --  107  --   CO2 22  --   --  24  --  21*  --   GLUCOSE 84  --   --  90  --  92  --   BUN 7*  --   --  <5*  --  6*  --   CREATININE 0.41*  --   --  0.36*  --  <0.30*  --   CALCIUM 7.4*  --   --  7.2*  --  7.1*  --   MG 1.5*  --  1.7 1.6* 1.5*  --  1.6*   GFR: CrCl cannot be calculated (This lab value cannot be used to calculate CrCl because it is not a number: <0.30). Liver Function Tests: No results for input(s): "AST", "ALT", "ALKPHOS", "BILITOT", "PROT", "ALBUMIN" in the last 168 hours. No results for input(s): "LIPASE", "AMYLASE" in the last 168 hours. No results for input(s): "AMMONIA" in the last 168 hours. Coagulation Profile: No results for input(s): "INR", "PROTIME" in the last 168 hours. Cardiac Enzymes: No results for input(s): "CKTOTAL", "CKMB", "CKMBINDEX", "TROPONINI" in the last 168 hours. BNP (last 3 results) No results for input(s): "PROBNP" in the last 8760 hours. HbA1C: No results for input(s): "HGBA1C" in the last 72 hours. CBG: Recent Labs  Lab 04/16/23 0423  GLUCAP 73   Lipid Profile: No results for input(s): "CHOL", "HDL", "LDLCALC", "TRIG", "CHOLHDL", "LDLDIRECT" in the last 72 hours. Thyroid Function Tests: No results for input(s): "TSH", "T4TOTAL", "FREET4", "T3FREE", "THYROIDAB" in the last 72 hours. Anemia Panel: No results for input(s): "VITAMINB12", "FOLATE", "FERRITIN", "TIBC", "IRON", "RETICCTPCT" in the last 72 hours. Most Recent Urinalysis On File:      Component Value Date/Time   COLORURINE AMBER (A) 04/12/2023 1004   APPEARANCEUR CLEAR 04/12/2023 1004   LABSPEC 1.025 04/12/2023 1004   PHURINE 5.0 04/12/2023 1004   GLUCOSEU NEGATIVE 04/12/2023 1004   HGBUR TRACE (A) 04/12/2023 1004   BILIRUBINUR MODERATE (A) 04/12/2023 1004   KETONESUR TRACE (A) 04/12/2023 1004   PROTEINUR 30 (A) 04/12/2023 1004   NITRITE POSITIVE (A) 04/12/2023 1004   LEUKOCYTESUR NEGATIVE 04/12/2023 1004   Sepsis Labs: @LABRCNTIP (procalcitonin:4,lacticidven:4) Microbiology: Recent Results (from the past 240 hour(s))  Urine Culture (for pregnant, neutropenic or urologic patients or patients with an indwelling urinary catheter)     Status: None   Collection Time: 04/12/23  2:45 PM   Specimen: In/Out Cath Urine  Result Value Ref Range Status   Specimen Description   Final    IN/OUT CATH URINE Performed at [facility], [address], [place], [place] [address]    Special Requests   Final    NONE Performed at [facility], [address] [place], [place] [address]    Culture   Final    NO GROWTH Performed at [facility], [address] [place], [place] [address]    Report Status 04/13/2023 FINAL  Final      Radiology Studies last 3 days: CT HEAD WO CONTRAST ( )  Result Date: 04/15/2023 CLINICAL DATA:  Initial evaluation for acute head trauma, minor.  EXAM: CT HEAD WITHOUT CONTRAST TECHNIQUE: Contiguous axial images were obtained from the base of the skull through the vertex without intravenous contrast. RADIATION DOSE REDUCTION: This exam was performed according to the departmental dose-optimization program which includes automated exposure control, adjustment of the mA and/or kV according to patient size and/or use of iterative reconstruction technique. COMPARISON:  Prior study from 05/20/2021. FINDINGS: Brain: Age-related cerebral atrophy with moderate chronic microvascular ischemic disease. No acute intracranial hemorrhage.  No acute large vessel territory infarct. No mass lesion, midline shift or mass effect. No hydrocephalus or extra-axial fluid collection. Vascular: No abnormal hyperdense vessel. Scattered vascular calcifications noted within the carotid siphons. Skull: Scalp soft tissues demonstrate no acute finding. Calvarium intact. Sinuses/Orbits: Globes and orbital soft tissues within normal limits. Visualized paranasal sinuses and mastoid air cells are clear. Other: None. IMPRESSION: 1. No acute intracranial abnormality. 2. Age-related cerebral atrophy with moderate chronic microvascular ischemic disease. Electronically Signed   By: Rise Mu M.D.   On: 04/15/2023 00:04             LOS: 27 days       Sunnie Nielsen, DO [facility] 04/17/2023, 4:12 PM    Dictation software may have been used to generate the above note. Typos may occur and escape review in typed/dictated notes. Please contact Dr Lyn Hollingshead directly for clarity if needed.  Staff may message me via secure chat in Epic  but this may not receive an immediate response,  please page me for urgent matters!  If 7PM-7AM, please contact night coverage [email or web address]

## 2023-04-18 DIAGNOSIS — R6520 Severe sepsis without septic shock: Secondary | ICD-10-CM | POA: Diagnosis not present

## 2023-04-18 DIAGNOSIS — A419 Sepsis, unspecified organism: Secondary | ICD-10-CM | POA: Diagnosis not present

## 2023-04-18 LAB — MAGNESIUM: Magnesium: 1.5 mg/dL — ABNORMAL LOW (ref 1.7–2.4)

## 2023-04-18 NOTE — Progress Notes (Signed)
PROGRESS NOTE    Ryan Rose   NWG:[id number] DOB: [birth year]  DOA: 03/21/2023 Date of Service: 04/18/23 PCP: Sherron Monday, MD     Brief Narrative / Hospital Course:  Ryan Rose is a 62 y.o. male with medical history significant of AAA status post repair and stenting, alcohol abuse, CAD, HTN, PVD, presented with persistent nauseous vomiting chest pain abdominal pain x1 day. He admitted he drinks occasionally and last drink was 2 days PTA.  Significant hospital events:  08/12: admitted to hospitalist service. Aortic dissection r/o, CTA (+)other concerns w/ esophageal thickening, chronic diverticulitis, severe diffuse hepatic steatosis.  08/15: Patient had upper endoscopy done which showed grade D esophagitis, large hiatal hernia and single nonbleeding angiectasia in the stomach treated with argon plasma. 08/16: Patient's hemoglobin dipped down to 6.5. Transfused 1 unit PRBC 08/18: Chest x-ray possible aspiration pneumonia.  Started on Unasyn. 08/20: Fever, hypotensive, tachycardia.  Patient given sepsis fluid bolus.  Antibiotics changed over to Maxipime and Flagyl.  MRCP ordered.  In the afternoon and blood pressure dropped down into the 70s and not taking midodrine, transfer to the stepdown for closer monitoring. 08/23: cholecystostomy tube placement  Since then and to 04/16/23 - difficult placement, concern for dementia and behavioral difficulty d/t this. Intermittent urinary retention, avoiding Foley d/t confusion and pt has removed IV lines etc. Has not been eating much, not good candidate for feeding tube, palliative care following peripherally.  09/07: decision made for comfort measures status with his wife     Consultants:  Gastroenterology  [facility] General Surgery Palliative Care Interventional Radiology   Procedures:  Upper endoscopy 08/15 which showed grade D esophagitis, large hiatal hernia and single nonbleeding angiectasia in the stomach treated with  argon plasma.   MRCP 08/20--Severe diffuse fatty infiltration of the liver. No focal hepatic lesions are identified without contrast. 2. Distended gallbladder with layering sludge. No definite gallstones. Gallbladder wall thickening and pericholecystic inflammatory changes could suggest acalculus cholecystitis or cholestasis. Normal caliber and course of the common bile duct. 3. Small bilateral pleural effusions with overlying atelectasis. There is also a cystic appearing fluid collection near the distal esophagus on the right side. This was not present on the prior CT scan and could be loculated pleural fluid. 4. Moderate mesenteric edema and small volume ascites.  Cholecystotomy tube placement 08/23         ASSESSMENT & PLAN:   Principal Problem:   Severe sepsis (HCC) Active Problems:   Acute blood loss anemia   Alcohol withdrawal delirium (HCC)   Acute upper GI bleed   Aspiration pneumonia (HCC)   Alcohol withdrawal (HCC)   Nausea & vomiting   Hypophosphatemia   Hypomagnesemia   Hypokalemia   Alcoholic ketoacidosis   PVD (peripheral vascular disease) (HCC)   Transaminitis   Macrocytic anemia   Abdominal aortic aneurysm (AAA) (HCC)   Leukocytosis   Alcohol abuse   Bilirubinemia   Hyponatremia   Arterial hypotension   Acute acalculous cholecystitis   Malnutrition of moderate degree   Advanced care planning Discussion today over the phone w/ his wife I updated her that he is still refusing to eat, refusing medications. Likely this is due to his dementia, at this point would consider impact on his functional status to be irreversible and progressive, essentially terminal. His rehabilitation potential is very poor  He does not have capacity to make decisions or understand his situation Options now 1) Option to force treatment w/ IV fluids and feeding tube,  but I suspect this will be a temporary stop-gap and he will pull lines/tubes or if we are eventually able to  remove them he will still not eat or drink and we will be right back in the same situation.  2) Option to focus on symptom management and comfort, and to do what we can to facilitate patient's stated goals for QOL which is, essentially, to not be bothered with lines, blood draws, tubes, aggressive measures, etc.  His wife, [name], asks that we make him comfortable and not do extra blood draws or place any IV lines. If he eats or drinks when offered, that is fine but will not force feed or force hydration artificially.  Have updated to comfort measures status Will seek hospice placement instead of SNF rehab  Severe sepsis (HCC) Acalculus cholecystitis S/p drain  cont drain management   Alcohol withdrawal delirium  Dementia suspect alcohol related Received 2 days of high-dose thiamine.   Right now patient does not have capacity make medical decisions.  confused at baseline however is redirected well. Comfort measures   Acute blood loss anemia Macrocytic anemia s/p 2u PRBC Stopping blood draws    Acute upper GI bleed EGD on 8/15 (+)grade D esophagitis, hiatal hernia and angiectasia which was treated with argon plasma coagulation.  Can restart PPI to prevent GERD, if he will take pills    Aspiration pneumonia - resolved  on abx, treated    Hypophosphatemia  Hypomagnesemia Hypokalemia Leukocytosis  Transaminitis  Stopping blood draws    Alcoholic ketoacidosis Resolved   PVD (peripheral vascular disease)  Stopping medications  Abdominal aortic aneurysm  No endoleak on CTA on admission. Comfort measures   Acute urinary retention, intermittent I/O cath removed ~600 ml urine, x2. cont bladder scan q6h, if post-void >500 ml, then I/O cath.  Try to avoid Foley placement since there is a high risk of pt pulling it out. try to position pt upright to help pt void   Poor oral intake refusing all Ensures and eating very little even while staff trying to feed him.  Refusing  meds. Stopped po meds    Current smoker pt persistently asked to go smoke Nicotine patch    DVT prophylaxis: lovenox to d/c on comfort measures IV fluids: no continuous IV fluids  Nutrition: regular diet, he has poor po intake  Central lines / invasive devices: cholecystostomy tube   Code Status: DNR ACP documentation reviewed: 04/15/23 MOST form [facility] on [facility] in [facility]  Current Admission Status: inpatient, Med/Surg  LOS: 27 TOC needs: placement Barriers to discharge / significant pending items: placement              Subjective / Brief ROS:  Patient alert today, no concerns   Family Communication: none at this time     Objective Findings:  Vitals:   04/17/23 0500 04/17/23 0758 04/17/23 2255 04/18/23 0854  BP:  (!) 118/90 101/73 102/79  Pulse:  (!) 105 (!) 112 (!) 108  Resp:  16 20 18   Temp:  98 F (36.7 C) 98.6 F (37 C) 97.7 F (36.5 C)  TempSrc:  Oral Oral Oral  SpO2:  100% 96% (!) 85%  Weight: 61.8 kg     Height:        Intake/Output Summary (Last 24 hours) at 04/18/2023 1358 Last data filed at 04/18/2023 0900 Gross per 24 hour  Intake --  Output 275 ml  Net -275 ml   Filed Weights   03/30/23 1855 04/15/23 0500  04/17/23 0500  Weight: 65 kg 67.9 kg 61.8 kg    Examination:  Physical Exam Constitutional:      General: He is not in acute distress. Cardiovascular:     Rate and Rhythm: Normal rate and regular rhythm.  Pulmonary:     Effort: Pulmonary effort is normal.  Abdominal:     Palpations: Abdomen is soft.  Skin:    General: Skin is warm and dry.  Neurological:     Mental Status: He is alert.  Psychiatric:        Mood and Affect: Mood normal.        Behavior: Behavior normal.          Scheduled Medications:   feeding supplement  237 mL Oral TID BM   nicotine  14 mg Transdermal Daily   pneumococcal 20-valent conjugate vaccine  0.5 mL Intramuscular Tomorrow-1000   sodium chloride flush  5 mL Intracatheter Q8H    Continuous  Infusions:    PRN Medications:  [DISCONTINUED] glycopyrrolate **OR** glycopyrrolate, ipratropium-albuterol, LORazepam, ondansetron (ZOFRAN) IV, ondansetron, mouth rinse, phenol, polyvinyl alcohol, promethazine (PHENERGAN) injection (IM or IVPB), ziprasidone  Antimicrobials from admission:  Anti-infectives (From admission, onward)    Start     Dose/Rate Route Frequency Ordered Stop   04/06/23 1030  amoxicillin-clavulanate (AUGMENTIN) 875-125 MG per tablet 1 tablet        1 tablet Oral Every 12 hours 04/06/23 0936 04/10/23 0959   04/05/23 1400  piperacillin-tazobactam (ZOSYN) IVPB 3.375 g  Status:  Discontinued        3.375 g 12.5 mL/hr over 240 Minutes Intravenous Every 8 hours 04/05/23 1000 04/06/23 0936   03/30/23 1800  ceFEPIme (MAXIPIME) 2 g in sodium chloride 0.9 % 100 mL IVPB  Status:  Discontinued        2 g 200 mL/hr over 30 Minutes Intravenous Every 8 hours 03/30/23 1235 04/05/23 1000   03/30/23 1000  metroNIDAZOLE (FLAGYL) IVPB 500 mg  Status:  Discontinued        500 mg 100 mL/hr over 60 Minutes Intravenous Every 12 hours 03/30/23 0819 04/05/23 1000   03/30/23 0900  ceFEPIme (MAXIPIME) 2 g in sodium chloride 0.9 % 100 mL IVPB        2 g 200 mL/hr over 30 Minutes Intravenous  Once 03/30/23 0819 03/30/23 1042   03/28/23 1600  Ampicillin-Sulbactam (UNASYN) 3 g in sodium chloride 0.9 % 100 mL IVPB  Status:  Discontinued        3 g 200 mL/hr over 30 Minutes Intravenous Every 6 hours 03/28/23 1442 03/30/23 0819   03/25/23 2000  cefTRIAXone (ROCEPHIN) 1 g in sodium chloride 0.9 % 100 mL IVPB  Status:  Discontinued        1 g 200 mL/hr over 30 Minutes Intravenous Every 24 hours 03/25/23 1837 03/27/23 0719           Data Reviewed:  I have personally reviewed the following...  CBC: Recent Labs  Lab 04/12/23 0342 04/15/23 0526  WBC 14.7* 12.4*  HGB 9.7* 10.1*  HCT 29.3* 31.3*  MCV 95.4 97.5  PLT 298 282   Basic Metabolic Panel: Recent Labs  Lab 04/12/23 0342  04/13/23 0426 04/14/23 0450 04/15/23 0526 04/16/23 0555 04/17/23 0454 04/17/23 0455 04/18/23 0905  NA 143  --   --  139  --  137  --   --   K 2.9* 3.2* 3.1* 3.0*  --  3.5  --   --   CL 112*  --   --  107  --  107  --   --   CO2 22  --   --  24  --  21*  --   --   GLUCOSE 84  --   --  90  --  92  --   --   BUN 7*  --   --  <5*  --  6*  --   --   CREATININE 0.41*  --   --  0.36*  --  <0.30*  --   --   CALCIUM 7.4*  --   --  7.2*  --  7.1*  --   --   MG 1.5*  --  1.7 1.6* 1.5*  --  1.6* 1.5*   GFR: CrCl cannot be calculated (This lab value cannot be used to calculate CrCl because it is not a number: <0.30). Liver Function Tests: No results for input(s): "AST", "ALT", "ALKPHOS", "BILITOT", "PROT", "ALBUMIN" in the last 168 hours. No results for input(s): "LIPASE", "AMYLASE" in the last 168 hours. No results for input(s): "AMMONIA" in the last 168 hours. Coagulation Profile: No results for input(s): "INR", "PROTIME" in the last 168 hours. Cardiac Enzymes: No results for input(s): "CKTOTAL", "CKMB", "CKMBINDEX", "TROPONINI" in the last 168 hours. BNP (last 3 results) No results for input(s): "PROBNP" in the last 8760 hours. HbA1C: No results for input(s): "HGBA1C" in the last 72 hours. CBG: Recent Labs  Lab 04/16/23 0423  GLUCAP 73   Lipid Profile: No results for input(s): "CHOL", "HDL", "LDLCALC", "TRIG", "CHOLHDL", "LDLDIRECT" in the last 72 hours. Thyroid Function Tests: No results for input(s): "TSH", "T4TOTAL", "FREET4", "T3FREE", "THYROIDAB" in the last 72 hours. Anemia Panel: No results for input(s): "VITAMINB12", "FOLATE", "FERRITIN", "TIBC", "IRON", "RETICCTPCT" in the last 72 hours. Most Recent Urinalysis On File:     Component Value Date/Time   COLORURINE AMBER (A) 04/12/2023 1004   APPEARANCEUR CLEAR 04/12/2023 1004   LABSPEC 1.025 04/12/2023 1004   PHURINE 5.0 04/12/2023 1004   GLUCOSEU NEGATIVE 04/12/2023 1004   HGBUR TRACE (A) 04/12/2023 1004    BILIRUBINUR MODERATE (A) 04/12/2023 1004   KETONESUR TRACE (A) 04/12/2023 1004   PROTEINUR 30 (A) 04/12/2023 1004   NITRITE POSITIVE (A) 04/12/2023 1004   LEUKOCYTESUR NEGATIVE 04/12/2023 1004   Sepsis Labs: @LABRCNTIP (procalcitonin:4,lacticidven:4) Microbiology: Recent Results (from the past 240 hour(s))  Urine Culture (for pregnant, neutropenic or urologic patients or patients with an indwelling urinary catheter)     Status: None   Collection Time: 04/12/23  2:45 PM   Specimen: In/Out Cath Urine  Result Value Ref Range Status   Specimen Description   Final    IN/OUT CATH URINE Performed at [facility], [address] [place], [place] [address]    Special Requests   Final    NONE Performed at [facility], [address] [place], [place] [address]    Culture   Final    NO GROWTH Performed at [facility], [address], [place], [place] [address]    Report Status 04/13/2023 FINAL  Final      Radiology Studies last 3 days: CT HEAD WO CONTRAST ( )  Result Date: 04/15/2023 CLINICAL DATA:  Initial evaluation for acute head trauma, minor. EXAM: CT HEAD WITHOUT CONTRAST TECHNIQUE: Contiguous axial images were obtained from the base of the skull through the vertex without intravenous contrast. RADIATION DOSE REDUCTION: This exam was performed according to the departmental dose-optimization program which includes automated exposure control, adjustment of the mA  and/or kV according to patient size and/or use of iterative reconstruction technique. COMPARISON:  Prior study from 05/20/2021. FINDINGS: Brain: Age-related cerebral atrophy with moderate chronic microvascular ischemic disease. No acute intracranial hemorrhage. No acute large vessel territory infarct. No mass lesion, midline shift or mass effect. No hydrocephalus or extra-axial fluid collection. Vascular: No abnormal hyperdense vessel. Scattered vascular calcifications noted within the carotid siphons.  Skull: Scalp soft tissues demonstrate no acute finding. Calvarium intact. Sinuses/Orbits: Globes and orbital soft tissues within normal limits. Visualized paranasal sinuses and mastoid air cells are clear. Other: None. IMPRESSION: 1. No acute intracranial abnormality. 2. Age-related cerebral atrophy with moderate chronic microvascular ischemic disease. Electronically Signed   By: Rise Mu M.D.   On: 04/15/2023 00:04             LOS: 28 days       Sunnie Nielsen, DO [facility] 04/18/2023, 1:58 PM    Dictation software may have been used to generate the above note. Typos may occur and escape review in typed/dictated notes. Please contact Dr Lyn Hollingshead directly for clarity if needed.  Staff may message me via secure chat in Epic  but this may not receive an immediate response,  please page me for urgent matters!  If 7PM-7AM, please contact night coverage [email or web address]

## 2023-04-19 DIAGNOSIS — R6520 Severe sepsis without septic shock: Secondary | ICD-10-CM | POA: Diagnosis not present

## 2023-04-19 DIAGNOSIS — A419 Sepsis, unspecified organism: Secondary | ICD-10-CM | POA: Diagnosis not present

## 2023-04-19 NOTE — Progress Notes (Signed)
[facility] Liaison Note  Received request from Ryan Rose, Transitions of Care Manager, for hospice services [facility]  Patient evaluated and per Dr. Alphonsus Sias, hospice physician, patient does not meet criteria for IPU.  Patient does not have any symptoms to manage and patient is not actively dying or in transition.  Called and spoke with patient's wife, Ryan Rose to inform her of the above information.  Shared that hospice could follow at patient's home or at LTC facility.  Ryan Rose informs me that him going home is not an option as there are no caregivers in the home to care for him.  She shares that she left the home at the beginning of August.  Report to hospital care team of the above information.  Hospice liaison team will continue collaboration with the hospital and pt/family through final disposition and offer any support for hospice related questions or concerns.  Thank you for allowing participation in this patient's care.  Norris Cross, RN Nurse Liaison [phone]

## 2023-04-19 NOTE — Progress Notes (Signed)
PROGRESS NOTE    Ryan Rose   YHC:[id number] DOB: 06-Feb-1961  DOA: 03/21/2023 Date of Service: 04/19/23 PCP: Sherron Monday, MD     Brief Narrative / Hospital Course:  Ryan Rose is a 62 y.o. male with medical history significant of AAA status post repair and stenting, alcohol abuse, CAD, HTN, PVD, presented with persistent nauseous vomiting chest pain abdominal pain x1 day. He admitted he drinks occasionally and last drink was 2 days PTA.  Significant hospital events:  08/12: admitted to hospitalist service. Aortic dissection r/o, CTA (+)other concerns w/ esophageal thickening, chronic diverticulitis, severe diffuse hepatic steatosis.  08/15: Patient had upper endoscopy done which showed grade D esophagitis, large hiatal hernia and single nonbleeding angiectasia in the stomach treated with argon plasma. 08/16: Patient's hemoglobin dipped down to 6.5. Transfused 1 unit PRBC 08/18: Chest x-ray possible aspiration pneumonia.  Started on Unasyn. 08/20: Fever, hypotensive, tachycardia.  Patient given sepsis fluid bolus.  Antibiotics changed over to Maxipime and Flagyl.  MRCP ordered.  In the afternoon and blood pressure dropped down into the 70s and not taking midodrine, transfer to the stepdown for closer monitoring. 08/23: cholecystostomy tube placement  Since then and to 04/16/23 - difficult placement, concern for dementia and behavioral difficulty d/t this. Intermittent urinary retention, avoiding Foley d/t confusion and pt has removed IV lines etc. Has not been eating much, not good candidate for feeding tube, palliative care following peripherally.  09/07: decision made for comfort measures status with his wife  09/08 Wynelle Link) - 09/09: stable, 09/09 (Mon) TOC working on placement     Consultants:  Gastroenterology  [facility] General Surgery Palliative Care Interventional Radiology   Procedures:  Upper endoscopy 08/15 which showed grade D esophagitis, large hiatal  hernia and single nonbleeding angiectasia in the stomach treated with argon plasma.   MRCP 08/20--Severe diffuse fatty infiltration of the liver. No focal hepatic lesions are identified without contrast. 2. Distended gallbladder with layering sludge. No definite gallstones. Gallbladder wall thickening and pericholecystic inflammatory changes could suggest acalculus cholecystitis or cholestasis. Normal caliber and course of the common bile duct. 3. Small bilateral pleural effusions with overlying atelectasis. There is also a cystic appearing fluid collection near the distal esophagus on the right side. This was not present on the prior CT scan and could be loculated pleural fluid. 4. Moderate mesenteric edema and small volume ascites.  Cholecystotomy tube placement 08/23         ASSESSMENT & PLAN:   Principal Problem:   Severe sepsis (HCC) Active Problems:   Acute blood loss anemia   Alcohol withdrawal delirium (HCC)   Acute upper GI bleed   Aspiration pneumonia (HCC)   Alcohol withdrawal (HCC)   Nausea & vomiting   Hypophosphatemia   Hypomagnesemia   Hypokalemia   Alcoholic ketoacidosis   PVD (peripheral vascular disease) (HCC)   Transaminitis   Macrocytic anemia   Abdominal aortic aneurysm (AAA) (HCC)   Leukocytosis   Alcohol abuse   Bilirubinemia   Hyponatremia   Arterial hypotension   Acute acalculous cholecystitis   Malnutrition of moderate degree   Advanced care planning Discussion 09/07 over the phone w/ his wife - see progress note for that day  Have updated to comfort measures status Will seek hospice placement instead of SNF rehab  Severe sepsis (HCC) Acalculus cholecystitis S/p drain  cont drain management   Alcohol withdrawal delirium  Dementia suspect alcohol related Received 2 days of high-dose thiamine.   Right now patient does  not have capacity make medical decisions.  confused at baseline however is redirected well. Comfort measures    Acute blood loss anemia Macrocytic anemia s/p 2u PRBC Stopping blood draws    Acute upper GI bleed EGD on 8/15 (+)grade D esophagitis, hiatal hernia and angiectasia which was treated with argon plasma coagulation.  Can restart PPI to prevent GERD, if he will take pills    Aspiration pneumonia - resolved  on abx, treated    Hypophosphatemia  Hypomagnesemia Hypokalemia Leukocytosis  Transaminitis  Stopping blood draws    Alcoholic ketoacidosis Resolved   PVD (peripheral vascular disease)  Stopping medications  Abdominal aortic aneurysm  No endoleak on CTA on admission. Comfort measures   Acute urinary retention, intermittent I/O cath removed ~600 ml urine, x2. cont bladder scan q6h, if post-void >500 ml, then I/O cath.  Try to avoid Foley placement since there is a high risk of pt pulling it out. try to position pt upright to help pt void   Poor oral intake refusing all Ensures and eating very little even while staff trying to feed him.  Refusing meds. Stopped po meds    Current smoker pt persistently asked to go smoke Nicotine patch    DVT prophylaxis: lovenox to d/c on comfort measures IV fluids: no continuous IV fluids  Nutrition: regular diet, he has poor po intake  Central lines / invasive devices: cholecystostomy tube   Code Status: DNR ACP documentation reviewed: 04/15/23 MOST form is on file in [facility]  Current Admission Status: inpatient, Med/Surg  LOS: 29 TOC needs: placement Barriers to discharge / significant pending items: placement              Subjective / Brief ROS:  Patient alert today, no concerns   Family Communication: none at this time     Objective Findings:  Vitals:   04/17/23 2255 04/18/23 0854 04/19/23 0409 04/19/23 0808  BP: 101/73 102/79 105/78 118/86  Pulse: (!) 112 (!) 108 (!) 111 (!) 107  Resp: 20 18 20 18   Temp: 98.6 F (37 C) 97.7 F (36.5 C) 98.8 F (37.1 C) 99.2 F (37.3 C)  TempSrc: Oral Oral  Oral   SpO2: 96% (!) 85% 99% 96%  Weight:      Height:        Intake/Output Summary (Last 24 hours) at 04/19/2023 1311 Last data filed at 04/19/2023 1146 Gross per 24 hour  Intake 0 ml  Output 60 ml  Net -60 ml   Filed Weights   03/30/23 1855 04/15/23 0500 04/17/23 0500  Weight: 65 kg 67.9 kg 61.8 kg    Examination:  Physical Exam Constitutional:      General: He is not in acute distress. Pulmonary:     Effort: Pulmonary effort is normal.  Abdominal:     Palpations: Abdomen is soft.  Neurological:     Mental Status: He is alert.          Scheduled Medications:   feeding supplement  237 mL Oral TID BM   nicotine  14 mg Transdermal Daily   pneumococcal 20-valent conjugate vaccine  0.5 mL Intramuscular Tomorrow-1000   sodium chloride flush  5 mL Intracatheter Q8H    Continuous Infusions:    PRN Medications:  [DISCONTINUED] glycopyrrolate **OR** glycopyrrolate, ipratropium-albuterol, LORazepam, ondansetron (ZOFRAN) IV, ondansetron, mouth rinse, phenol, polyvinyl alcohol, promethazine (PHENERGAN) injection (IM or IVPB), ziprasidone  Antimicrobials from admission:  Anti-infectives (From admission, onward)    Start     Dose/Rate Route Frequency  Ordered Stop   04/06/23 1030  amoxicillin-clavulanate (AUGMENTIN) 875-125 MG per tablet 1 tablet        1 tablet Oral Every 12 hours 04/06/23 0936 04/10/23 0959   04/05/23 1400  piperacillin-tazobactam (ZOSYN) IVPB 3.375 g  Status:  Discontinued        3.375 g 12.5 mL/hr over 240 Minutes Intravenous Every 8 hours 04/05/23 1000 04/06/23 0936   03/30/23 1800  ceFEPIme (MAXIPIME) 2 g in sodium chloride 0.9 % 100 mL IVPB  Status:  Discontinued        2 g 200 mL/hr over 30 Minutes Intravenous Every 8 hours 03/30/23 1235 04/05/23 1000   03/30/23 1000  metroNIDAZOLE (FLAGYL) IVPB 500 mg  Status:  Discontinued        500 mg 100 mL/hr over 60 Minutes Intravenous Every 12 hours 03/30/23 0819 04/05/23 1000   03/30/23 0900  ceFEPIme  (MAXIPIME) 2 g in sodium chloride 0.9 % 100 mL IVPB        2 g 200 mL/hr over 30 Minutes Intravenous  Once 03/30/23 0819 03/30/23 1042   03/28/23 1600  Ampicillin-Sulbactam (UNASYN) 3 g in sodium chloride 0.9 % 100 mL IVPB  Status:  Discontinued        3 g 200 mL/hr over 30 Minutes Intravenous Every 6 hours 03/28/23 1442 03/30/23 0819   03/25/23 2000  cefTRIAXone (ROCEPHIN) 1 g in sodium chloride 0.9 % 100 mL IVPB  Status:  Discontinued        1 g 200 mL/hr over 30 Minutes Intravenous Every 24 hours 03/25/23 1837 03/27/23 0719           Data Reviewed:  I have personally reviewed the following...  CBC: Recent Labs  Lab 04/15/23 0526  WBC 12.4*  HGB 10.1*  HCT 31.3*  MCV 97.5  PLT 282   Basic Metabolic Panel: Recent Labs  Lab 04/13/23 0426 04/14/23 0450 04/15/23 0526 04/16/23 0555 04/17/23 0454 04/17/23 0455 04/18/23 0905  NA  --   --  139  --  137  --   --   K 3.2* 3.1* 3.0*  --  3.5  --   --   CL  --   --  107  --  107  --   --   CO2  --   --  24  --  21*  --   --   GLUCOSE  --   --  90  --  92  --   --   BUN  --   --  <5*  --  6*  --   --   CREATININE  --   --  0.36*  --  <0.30*  --   --   CALCIUM  --   --  7.2*  --  7.1*  --   --   MG  --  1.7 1.6* 1.5*  --  1.6* 1.5*   GFR: CrCl cannot be calculated (This lab value cannot be used to calculate CrCl because it is not a number: <0.30). Liver Function Tests: No results for input(s): "AST", "ALT", "ALKPHOS", "BILITOT", "PROT", "ALBUMIN" in the last 168 hours. No results for input(s): "LIPASE", "AMYLASE" in the last 168 hours. No results for input(s): "AMMONIA" in the last 168 hours. Coagulation Profile: No results for input(s): "INR", "PROTIME" in the last 168 hours. Cardiac Enzymes: No results for input(s): "CKTOTAL", "CKMB", "CKMBINDEX", "TROPONINI" in the last 168 hours. BNP (last 3 results) No results for input(s): "PROBNP" in the last  8760 hours. HbA1C: No results for input(s): "HGBA1C" in the last 72  hours. CBG: Recent Labs  Lab 04/16/23 0423  GLUCAP 73   Lipid Profile: No results for input(s): "CHOL", "HDL", "LDLCALC", "TRIG", "CHOLHDL", "LDLDIRECT" in the last 72 hours. Thyroid Function Tests: No results for input(s): "TSH", "T4TOTAL", "FREET4", "T3FREE", "THYROIDAB" in the last 72 hours. Anemia Panel: No results for input(s): "VITAMINB12", "FOLATE", "FERRITIN", "TIBC", "IRON", "RETICCTPCT" in the last 72 hours. Most Recent Urinalysis On File:     Component Value Date/Time   COLORURINE AMBER (A) 04/12/2023 1004   APPEARANCEUR CLEAR 04/12/2023 1004   LABSPEC 1.025 04/12/2023 1004   PHURINE 5.0 04/12/2023 1004   GLUCOSEU NEGATIVE 04/12/2023 1004   HGBUR TRACE (A) 04/12/2023 1004   BILIRUBINUR MODERATE (A) 04/12/2023 1004   KETONESUR TRACE (A) 04/12/2023 1004   PROTEINUR 30 (A) 04/12/2023 1004   NITRITE POSITIVE (A) 04/12/2023 1004   LEUKOCYTESUR NEGATIVE 04/12/2023 1004   Sepsis Labs: @LABRCNTIP (procalcitonin:4,lacticidven:4) Microbiology: Recent Results (from the past 240 hour(s))  Urine Culture (for pregnant, neutropenic or urologic patients or patients with an indwelling urinary catheter)     Status: None   Collection Time: 04/12/23  2:45 PM   Specimen: In/Out Cath Urine  Result Value Ref Range Status   Specimen Description   Final    IN/OUT CATH URINE Performed at [facility], [address] [place], [place] [address]    Special Requests   Final    NONE Performed at [facility], [address] [place], [place] [address]    Culture   Final    NO GROWTH Performed at [facility], [address], [place], [place] [address]    Report Status 04/13/2023 FINAL  Final      Radiology Studies last 3 days: No results found.           LOS: 29 days       Sunnie Nielsen, DO [facility] 04/19/2023, 1:11 PM    Dictation software may have been used to generate the above note. Typos may occur and escape review in  typed/dictated notes. Please contact Dr Lyn Hollingshead directly for clarity if needed.  Staff may message me via secure chat in Epic  but this may not receive an immediate response,  please page me for urgent matters!  If 7PM-7AM, please contact night coverage [email or web address]

## 2023-04-19 NOTE — Progress Notes (Signed)
PT Cancellation Note  Patient Details Name: Ryan Rose MRN: [id number] DOB: April 16, 1961   Cancelled Treatment:    Reason Eval/Treat Not Completed: Other (comment). Chart reviewed and noted pt transitioned to comfort care effective 9/7. Will dc current orders at this time.   Karinna Beadles 04/19/2023, 1:55 PM Elizabeth Palau, PT, DPT, GCS [phone]

## 2023-04-19 NOTE — Progress Notes (Signed)
Mobility Specialist - Progress Note   04/19/23 1516  Mobility  Activity Turned to right side;Turned to back - supine  Level of Assistance Minimal assist, patient does 75% or more  Range of Motion/Exercises Active Assistive;All extremities  Activity Response Tolerated well  $Mobility charge 1 Mobility  Mobility Specialist Start Time (ACUTE ONLY) 1458  Mobility Specialist Stop Time (ACUTE ONLY) 1513  Mobility Specialist Time Calculation (min) (ACUTE ONLY) 15 min   Pt requesting to get out of bed prior to entry, however declines upon arrival. Pt sat up in bed indep and completed TherEx while seated; Bilateral leg raises, knee bends, shoulder circles. Pt returned supine with alarm set and needs within reach. RN notified.  Zetta Bills Mobility Specialist 04/19/23 3:24 PM

## 2023-04-19 NOTE — TOC Progression Note (Signed)
Transition of Care [facility]) - Progression Note    Patient Details  Name: Ryan Rose MRN: [id number] Date of Birth: 12/30/1960  Transition [facility]) CM/SW Contact  Darolyn Rua, [place] Phone Number: 04/19/2023, 2:31 PM  Clinical Narrative:     Per MD patient was transitioned to comfort measures, CSW spoke with patient's sister Elease Hashimoto who confirms that spouse and her are in agreement with [facility] facility. Referral sent to [place] pending if qualifies for hospice home at this time.         Expected Discharge Plan and Services                                               Social Determinants of Health (SDOH) Interventions SDOH Screenings   Food Insecurity: Patient Declined (03/21/2023)  Housing: Patient Declined (03/21/2023)  Transportation Needs: No Transportation Needs (03/21/2023)  Utilities: Not At Risk (03/21/2023)  Tobacco Use: High Risk (04/01/2023)    Readmission Risk Interventions     No data to display

## 2023-04-19 NOTE — Plan of Care (Signed)
  Problem: Education: Goal: Knowledge of General Education information will improve Description: Including pain rating scale, medication(s)/side effects and non-pharmacologic comfort measures Outcome: Progressing   Problem: Health Behavior/Discharge Planning: Goal: Ability to manage health-related needs will improve Outcome: Progressing   Problem: Clinical Measurements: Goal: Ability to maintain clinical measurements within normal limits will improve Outcome: Progressing Goal: Will remain free from infection Outcome: Progressing Goal: Diagnostic test results will improve Outcome: Progressing Goal: Respiratory complications will improve Outcome: Progressing Goal: Cardiovascular complication will be avoided Outcome: Progressing   Problem: Activity: Goal: Risk for activity intolerance will decrease Outcome: Progressing   Problem: Nutrition: Goal: Adequate nutrition will be maintained Outcome: Progressing   Problem: Coping: Goal: Level of anxiety will decrease Outcome: Progressing   Problem: Elimination: Goal: Will not experience complications related to bowel motility Outcome: Progressing Goal: Will not experience complications related to urinary retention Outcome: Progressing   Problem: Pain Managment: Goal: General experience of comfort will improve Outcome: Progressing   Problem: Safety: Goal: Ability to remain free from injury will improve Outcome: Progressing   Problem: Skin Integrity: Goal: Risk for impaired skin integrity will decrease Outcome: Progressing   Problem: Fluid Volume: Goal: Hemodynamic stability will improve Outcome: Progressing   Problem: Clinical Measurements: Goal: Diagnostic test results will improve Outcome: Progressing Goal: Signs and symptoms of infection will decrease Outcome: Progressing   Problem: Respiratory: Goal: Ability to maintain adequate ventilation will improve Outcome: Progressing   Problem: Education: Goal:  Knowledge of discharge needs will improve Outcome: Progressing   Problem: Clinical Measurements: Goal: Postoperative complications will be avoided or minimized Outcome: Progressing   Problem: Respiratory: Goal: Ability to achieve and maintain a regular respiratory rate will improve Outcome: Progressing   Problem: Skin Integrity: Goal: Demonstration of wound healing without infection will improve Outcome: Progressing

## 2023-04-20 DIAGNOSIS — Z515 Encounter for palliative care: Secondary | ICD-10-CM | POA: Diagnosis not present

## 2023-04-20 DIAGNOSIS — A419 Sepsis, unspecified organism: Secondary | ICD-10-CM | POA: Diagnosis not present

## 2023-04-20 DIAGNOSIS — E440 Moderate protein-calorie malnutrition: Secondary | ICD-10-CM | POA: Diagnosis not present

## 2023-04-20 DIAGNOSIS — F1010 Alcohol abuse, uncomplicated: Secondary | ICD-10-CM | POA: Diagnosis not present

## 2023-04-20 DIAGNOSIS — R6520 Severe sepsis without septic shock: Secondary | ICD-10-CM | POA: Diagnosis not present

## 2023-04-20 NOTE — Progress Notes (Signed)
Palliative Care Progress Note, Assessment & Plan   Patient Name: Ryan Rose       Date: 04/20/2023 DOB: 07-Aug-1961  Age: 62 y.o. MRN#: [id number] Attending Physician: Sunnie Nielsen, DO Primary Care Physician: Sherron Monday, MD Admit Date: 03/21/2023  Subjective: Patient is lying in bed, sleeping peacefully, but is easily awakened.  He acknowledges my presence.  He is not able to engage in medical decision making independently at this time.  Patient continues to lack capacity to make decisions for himself.  He is alert to self.  No family or friends present during my visit.  HPI: Ryan Rose is a 62 y.o. male with medical history significant of AAA status post repair and stenting, alcohol abuse, CAD, HTN, PVD, presented with persistent nauseous vomiting chest pain abdominal pain x1 day. He admitted he drinks occasionally and last drink was 2 days PTA.   Significant hospital events:  08/12: admitted to hospitalist service. Aortic dissection r/o, CTA (+)other concerns w/ esophageal thickening, chronic diverticulitis, severe diffuse hepatic steatosis.  08/15: Patient had upper endoscopy done which showed grade D esophagitis, large hiatal hernia and single nonbleeding angiectasia in the stomach treated with argon plasma. 08/16: Patient's hemoglobin dipped down to 6.5. Transfused 1 unit PRBC 08/18: Chest x-ray possible aspiration pneumonia.  Started on Unasyn. 08/20: Fever, hypotensive, tachycardia.  Patient given sepsis fluid bolus.  Antibiotics changed over to Maxipime and Flagyl.  MRCP ordered.  In the afternoon and blood pressure dropped down into the 70s and not taking midodrine, transfer to the stepdown for closer monitoring. 08/23: cholecystostomy tube placement  Since then and to  04/16/23 - difficult placement, concern for dementia and behavioral difficulty d/t this. Intermittent urinary retention, avoiding Foley d/t confusion and pt has removed IV lines etc. Has not been eating much, not good candidate for feeding tube, palliative care following peripherally.  09/07: decision made for comfort measures status with his wife  09/08- 09/10: TOC working on placement   Summary of counseling/coordination of care: After reviewing the patient's chart and assessing the patient at bedside, I attempted to assess patient's symptoms.  Patient continues to shake his head no when asked if he is in pain or discomfort.  However, he is vocalizing that he "feels bad".  Patient could not articulate what "bad" he is currently experiencing.  No nonverbal signs of pain or discomfort such as brow furrowing or grimacing noted at this time.  I offered breakfast food from tray at bedside.  Patient declined.  He shares he would like to eat roast beef with potatoes and pinto beans.  I encourage p.o. intake but patient continued to decline food/liquid at bedside.  After visiting with the patient, I spoke with his RN.  Reviewed that patient is requesting specific foods, to which she has requested from the kitchen.  Full comfort measures remain.  Disposition planning is being followed closely by TOC.  PMT will continue to follow and support patient throughout his hospitalization.  Physical Exam Vitals reviewed.  Constitutional:      General: He is not in acute distress.    Appearance: He is not ill-appearing.  HENT:     Head: Normocephalic.  Eyes:  Pupils: Pupils are equal, round, and reactive to light.  Cardiovascular:     Rate and Rhythm: Normal rate.     Heart sounds: Normal heart sounds.  Abdominal:     General: Bowel sounds are normal.  Skin:    General: Skin is warm and dry.     Coloration: Skin is pale.  Neurological:     Mental Status: He is alert.  Psychiatric:        Mood and  Affect: Mood is not anxious.        Behavior: Behavior is not agitated.             Total Time 25 minutes   Rosmarie Esquibel L. Bonita Quin, DNP, FNP-BC Palliative Medicine Team

## 2023-04-20 NOTE — Plan of Care (Signed)
  Problem: Education: Goal: Knowledge of General Education information will improve Description: Including pain rating scale, medication(s)/side effects and non-pharmacologic comfort measures Outcome: Progressing   Problem: Health Behavior/Discharge Planning: Goal: Ability to manage health-related needs will improve Outcome: Progressing   Problem: Clinical Measurements: Goal: Ability to maintain clinical measurements within normal limits will improve Outcome: Progressing Goal: Will remain free from infection Outcome: Progressing Goal: Diagnostic test results will improve Outcome: Progressing Goal: Respiratory complications will improve Outcome: Progressing Goal: Cardiovascular complication will be avoided Outcome: Progressing   Problem: Activity: Goal: Risk for activity intolerance will decrease Outcome: Progressing   Problem: Nutrition: Goal: Adequate nutrition will be maintained Outcome: Progressing   Problem: Coping: Goal: Level of anxiety will decrease Outcome: Progressing   Problem: Elimination: Goal: Will not experience complications related to bowel motility Outcome: Progressing Goal: Will not experience complications related to urinary retention Outcome: Progressing   Problem: Pain Managment: Goal: General experience of comfort will improve Outcome: Progressing   Problem: Safety: Goal: Ability to remain free from injury will improve Outcome: Progressing   Problem: Skin Integrity: Goal: Risk for impaired skin integrity will decrease Outcome: Progressing   Problem: Fluid Volume: Goal: Hemodynamic stability will improve Outcome: Progressing   Problem: Clinical Measurements: Goal: Diagnostic test results will improve Outcome: Progressing Goal: Signs and symptoms of infection will decrease Outcome: Progressing   Problem: Respiratory: Goal: Ability to maintain adequate ventilation will improve Outcome: Progressing   Problem: Education: Goal:  Knowledge of discharge needs will improve Outcome: Progressing   Problem: Clinical Measurements: Goal: Postoperative complications will be avoided or minimized Outcome: Progressing   Problem: Respiratory: Goal: Ability to achieve and maintain a regular respiratory rate will improve Outcome: Progressing   Problem: Skin Integrity: Goal: Demonstration of wound healing without infection will improve Outcome: Progressing

## 2023-04-20 NOTE — TOC Progression Note (Signed)
Transition of Care [facility]) - Progression Note    Patient Details  Name: Ryan Rose MRN: [id number] Date of Birth: July 17, 1961  Transition of Care [facility]) CM/SW Contact  Allena Katz, LCSW Phone Number: 04/20/2023, 9:42 AM  Clinical Narrative:   CSW spoke with christine at [facility] to inform we can do LOG she states I will need to coordinate with [name]. CSW lvm with tammy at [facility].         Expected Discharge Plan and Services                                               Social Determinants of Health (SDOH) Interventions SDOH Screenings   Food Insecurity: Patient Declined (03/21/2023)  Housing: Patient Declined (03/21/2023)  Transportation Needs: No Transportation Needs (03/21/2023)  Utilities: Not At Risk (03/21/2023)  Tobacco Use: High Risk (04/01/2023)    Readmission Risk Interventions     No data to display

## 2023-04-20 NOTE — TOC Progression Note (Signed)
Transition of Care [facility]) - Progression Note    Patient Details  Name: Ryan Rose MRN: [id number] Date of Birth: 05/01/1961  Transition of Care [facility]) CM/SW Contact  Allena Katz, LCSW Phone Number: 04/20/2023, 3:08 PM  Clinical Narrative:   [name] with [facility] needs admission paperwork signed before LOG is sent. Wife aware and going to come to room to fill out paperwork.          Expected Discharge Plan and Services                                               Social Determinants of Health (SDOH) Interventions SDOH Screenings   Food Insecurity: Patient Declined (03/21/2023)  Housing: Patient Declined (03/21/2023)  Transportation Needs: No Transportation Needs (03/21/2023)  Utilities: Not At Risk (03/21/2023)  Tobacco Use: High Risk (04/01/2023)    Readmission Risk Interventions     No data to display

## 2023-04-20 NOTE — Progress Notes (Signed)
[facility] Liaison will continue to follow patient peripherally while waiting for a discharge disposition.    Please call with any Hospice related questions or concerns.    [facility] Liaison [phone]

## 2023-04-20 NOTE — Progress Notes (Signed)
Nutrition Brief Note  Chart reviewed. Pt now transitioning to comfort care. Per chart review, pt transitioned to comfort measured on 04/17/23. He is still awaiting most appropriate discharge disposition.  No further nutrition interventions planned at this time.  Please re-consult as needed.   Levada Schilling, RD, LDN, CDCES Registered Dietitian II Certified Diabetes Care and Education Specialist Please refer to [facility] for RD and/or RD on-call/weekend/after hours pager

## 2023-04-20 NOTE — Progress Notes (Signed)
PROGRESS NOTE    Ryan Rose   WUJ:[id number] DOB: 1961-02-05  DOA: 03/21/2023 Date of Service: 04/20/23 PCP: Sherron Monday, MD     Brief Narrative / Hospital Course:  Ryan Rose is a 62 y.o. male with medical history significant of AAA status post repair and stenting, alcohol abuse, CAD, HTN, PVD, presented with persistent nauseous vomiting chest pain abdominal pain x1 day. He admitted he drinks occasionally and last drink was 2 days PTA.  Significant hospital events:  08/12: admitted to hospitalist service. Aortic dissection r/o, CTA (+)other concerns w/ esophageal thickening, chronic diverticulitis, severe diffuse hepatic steatosis.  08/15: Patient had upper endoscopy done which showed grade D esophagitis, large hiatal hernia and single nonbleeding angiectasia in the stomach treated with argon plasma. 08/16: Patient's hemoglobin dipped down to 6.5. Transfused 1 unit PRBC 08/18: Chest x-ray possible aspiration pneumonia.  Started on Unasyn. 08/20: Fever, hypotensive, tachycardia.  Patient given sepsis fluid bolus.  Antibiotics changed over to Maxipime and Flagyl.  MRCP ordered.  In the afternoon and blood pressure dropped down into the 70s and not taking midodrine, transfer to the stepdown for closer monitoring. 08/23: cholecystostomy tube placement  Since then and to 04/16/23 - difficult placement, concern for dementia and behavioral difficulty d/t this. Intermittent urinary retention, avoiding Foley d/t confusion and pt has removed IV lines etc. Has not been eating much, not good candidate for feeding tube, palliative care following peripherally.  09/07: decision made for comfort measures status with his wife  09/08 Wynelle Link): stable 09/09 (Mon) - present 09/10: TOC working on placement     Consultants:  Gastroenterology  [facility] General Surgery Palliative Care Interventional Radiology   Procedures:  Upper endoscopy 08/15 which showed grade D esophagitis, large  hiatal hernia and single nonbleeding angiectasia in the stomach treated with argon plasma.   MRCP 08/20--Severe diffuse fatty infiltration of the liver. No focal hepatic lesions are identified without contrast. 2. Distended gallbladder with layering sludge. No definite gallstones. Gallbladder wall thickening and pericholecystic inflammatory changes could suggest acalculus cholecystitis or cholestasis. Normal caliber and course of the common bile duct. 3. Small bilateral pleural effusions with overlying atelectasis. There is also a cystic appearing fluid collection near the distal esophagus on the right side. This was not present on the prior CT scan and could be loculated pleural fluid. 4. Moderate mesenteric edema and small volume ascites.  Cholecystotomy tube placement 08/23         ASSESSMENT & PLAN:   Principal Problem:   Severe sepsis (HCC) Active Problems:   Acute blood loss anemia   Alcohol withdrawal delirium (HCC)   Acute upper GI bleed   Aspiration pneumonia (HCC)   Alcohol withdrawal (HCC)   Nausea & vomiting   Hypophosphatemia   Hypomagnesemia   Hypokalemia   Alcoholic ketoacidosis   PVD (peripheral vascular disease) (HCC)   Transaminitis   Macrocytic anemia   Abdominal aortic aneurysm (AAA) (HCC)   Leukocytosis   Alcohol abuse   Bilirubinemia   Hyponatremia   Arterial hypotension   Acute acalculous cholecystitis   Malnutrition of moderate degree   Advanced care planning Discussion 09/07 over the phone w/ his wife - see progress note for that day  Have updated to comfort measures status Will seek hospice placement instead of SNF rehab  Severe sepsis (HCC) Acalculus cholecystitis S/p drain  cont drain management   Alcohol withdrawal delirium  Dementia suspect alcohol related Received 2 days of high-dose thiamine.   Right now patient  does not have capacity make medical decisions.  confused at baseline however is redirected well. Comfort  measures   Acute blood loss anemia Macrocytic anemia s/p 2u PRBC Stopping blood draws    Acute upper GI bleed EGD on 8/15 (+)grade D esophagitis, hiatal hernia and angiectasia which was treated with argon plasma coagulation.  Can restart PPI to prevent GERD, if he will take pills    Aspiration pneumonia - resolved  on abx, treated    Hypophosphatemia  Hypomagnesemia Hypokalemia Leukocytosis  Transaminitis  Stopping blood draws    Alcoholic ketoacidosis Resolved   PVD (peripheral vascular disease)  Stopping medications  Abdominal aortic aneurysm  No endoleak on CTA on admission. Comfort measures   Acute urinary retention, intermittent I/O cath removed ~600 ml urine, x2. cont bladder scan q6h, if post-void >500 ml, then I/O cath.  Try to avoid Foley placement since there is a high risk of pt pulling it out. try to position pt upright to help pt void   Poor oral intake refusing all Ensures and eating very little even while staff trying to feed him.  Refusing meds. Stopped po meds    Current smoker pt persistently asked to go smoke Nicotine patch    DVT prophylaxis: lovenox to d/c on comfort measures IV fluids: no continuous IV fluids  Nutrition: regular diet, he has poor po intake  Central lines / invasive devices: cholecystostomy tube   Code Status: DNR ACP documentation reviewed: 04/15/23 MOST form is on [facility] in [facility]  Current Admission Status: inpatient, Med/Surg  LOS: 29 TOC needs: placement Barriers to discharge / significant pending items: placement              Subjective / Brief ROS:  Patient calm today, no concerns   Family Communication: none at this time     Objective Findings:  Vitals:   04/19/23 2131 04/20/23 0343 04/20/23 0602 04/20/23 0836  BP: 131/85  (!) 128/96 99/65  Pulse: (!) 107  (!) 109 (!) 106  Resp: 19  17 16   Temp: 98 F (36.7 C)  98.3 F (36.8 C) 97.8 F (36.6 C)  TempSrc:      SpO2: 98%  97% 94%  Weight:   64.3 kg    Height:        Intake/Output Summary (Last 24 hours) at 04/20/2023 1335 Last data filed at 04/19/2023 2013 Gross per 24 hour  Intake 0 ml  Output --  Net 0 ml   Filed Weights   04/15/23 0500 04/17/23 0500 04/20/23 0343  Weight: 67.9 kg 61.8 kg 64.3 kg    Examination:  Physical Exam Constitutional:      General: He is not in acute distress. Pulmonary:     Effort: Pulmonary effort is normal.  Abdominal:     Palpations: Abdomen is soft.  Neurological:     Mental Status: He is alert.          Scheduled Medications:   feeding supplement  237 mL Oral TID BM   nicotine  14 mg Transdermal Daily   pneumococcal 20-valent conjugate vaccine  0.5 mL Intramuscular Tomorrow-1000   sodium chloride flush  5 mL Intracatheter Q8H    Continuous Infusions:    PRN Medications:  [DISCONTINUED] glycopyrrolate **OR** glycopyrrolate, ipratropium-albuterol, LORazepam, ondansetron (ZOFRAN) IV, ondansetron, mouth rinse, phenol, polyvinyl alcohol, promethazine (PHENERGAN) injection (IM or IVPB), ziprasidone  Antimicrobials from admission:  Anti-infectives (From admission, onward)    Start     Dose/Rate Route Frequency Ordered Stop  04/06/23 1030  amoxicillin-clavulanate (AUGMENTIN) 875-125 MG per tablet 1 tablet        1 tablet Oral Every 12 hours 04/06/23 0936 04/10/23 0959   04/05/23 1400  piperacillin-tazobactam (ZOSYN) IVPB 3.375 g  Status:  Discontinued        3.375 g 12.5 mL/hr over 240 Minutes Intravenous Every 8 hours 04/05/23 1000 04/06/23 0936   03/30/23 1800  ceFEPIme (MAXIPIME) 2 g in sodium chloride 0.9 % 100 mL IVPB  Status:  Discontinued        2 g 200 mL/hr over 30 Minutes Intravenous Every 8 hours 03/30/23 1235 04/05/23 1000   03/30/23 1000  metroNIDAZOLE (FLAGYL) IVPB 500 mg  Status:  Discontinued        500 mg 100 mL/hr over 60 Minutes Intravenous Every 12 hours 03/30/23 0819 04/05/23 1000   03/30/23 0900  ceFEPIme (MAXIPIME) 2 g in sodium chloride 0.9 %  100 mL IVPB        2 g 200 mL/hr over 30 Minutes Intravenous  Once 03/30/23 0819 03/30/23 1042   03/28/23 1600  Ampicillin-Sulbactam (UNASYN) 3 g in sodium chloride 0.9 % 100 mL IVPB  Status:  Discontinued        3 g 200 mL/hr over 30 Minutes Intravenous Every 6 hours 03/28/23 1442 03/30/23 0819   03/25/23 2000  cefTRIAXone (ROCEPHIN) 1 g in sodium chloride 0.9 % 100 mL IVPB  Status:  Discontinued        1 g 200 mL/hr over 30 Minutes Intravenous Every 24 hours 03/25/23 1837 03/27/23 0719           Data Reviewed:  I have personally reviewed the following...  CBC: Recent Labs  Lab 04/15/23 0526  WBC 12.4*  HGB 10.1*  HCT 31.3*  MCV 97.5  PLT 282   Basic Metabolic Panel: Recent Labs  Lab 04/14/23 0450 04/15/23 0526 04/16/23 0555 04/17/23 0454 04/17/23 0455 04/18/23 0905  NA  --  139  --  137  --   --   K 3.1* 3.0*  --  3.5  --   --   CL  --  107  --  107  --   --   CO2  --  24  --  21*  --   --   GLUCOSE  --  90  --  92  --   --   BUN  --  <5*  --  6*  --   --   CREATININE  --  0.36*  --  <0.30*  --   --   CALCIUM  --  7.2*  --  7.1*  --   --   MG 1.7 1.6* 1.5*  --  1.6* 1.5*   GFR: CrCl cannot be calculated (This lab value cannot be used to calculate CrCl because it is not a number: <0.30). Liver Function Tests: No results for input(s): "AST", "ALT", "ALKPHOS", "BILITOT", "PROT", "ALBUMIN" in the last 168 hours. No results for input(s): "LIPASE", "AMYLASE" in the last 168 hours. No results for input(s): "AMMONIA" in the last 168 hours. Coagulation Profile: No results for input(s): "INR", "PROTIME" in the last 168 hours. Cardiac Enzymes: No results for input(s): "CKTOTAL", "CKMB", "CKMBINDEX", "TROPONINI" in the last 168 hours. BNP (last 3 results) No results for input(s): "PROBNP" in the last 8760 hours. HbA1C: No results for input(s): "HGBA1C" in the last 72 hours. CBG: Recent Labs  Lab 04/16/23 0423  GLUCAP 73   Lipid Profile: No results for  input(s): "CHOL", "HDL", "LDLCALC", "TRIG", "CHOLHDL", "LDLDIRECT" in the last 72 hours. Thyroid Function Tests: No results for input(s): "TSH", "T4TOTAL", "FREET4", "T3FREE", "THYROIDAB" in the last 72 hours. Anemia Panel: No results for input(s): "VITAMINB12", "FOLATE", "FERRITIN", "TIBC", "IRON", "RETICCTPCT" in the last 72 hours. Most Recent Urinalysis On File:     Component Value Date/Time   COLORURINE AMBER (A) 04/12/2023 1004   APPEARANCEUR CLEAR 04/12/2023 1004   LABSPEC 1.025 04/12/2023 1004   PHURINE 5.0 04/12/2023 1004   GLUCOSEU NEGATIVE 04/12/2023 1004   HGBUR TRACE (A) 04/12/2023 1004   BILIRUBINUR MODERATE (A) 04/12/2023 1004   KETONESUR TRACE (A) 04/12/2023 1004   PROTEINUR 30 (A) 04/12/2023 1004   NITRITE POSITIVE (A) 04/12/2023 1004   LEUKOCYTESUR NEGATIVE 04/12/2023 1004   Sepsis Labs: @LABRCNTIP (procalcitonin:4,lacticidven:4) Microbiology: Recent Results (from the past 240 hour(s))  Urine Culture (for pregnant, neutropenic or urologic patients or patients with an indwelling urinary catheter)     Status: None   Collection Time: 04/12/23  2:45 PM   Specimen: In/Out Cath Urine  Result Value Ref Range Status   Specimen Description   Final    IN/OUT CATH URINE Performed at [facility], [address], [place], [place] [address]    Special Requests   Final    NONE Performed at [facility], [address], [place], [place] [address]    Culture   Final    NO GROWTH Performed at [facility], [address], [place], [place] [address]    Report Status 04/13/2023 FINAL  Final      Radiology Studies last 3 days: No results found.           LOS: 30 days       Sunnie Nielsen, DO [facility] 04/20/2023, 1:35 PM    Dictation software may have been used to generate the above note. Typos may occur and escape review in typed/dictated notes. Please contact Dr Lyn Hollingshead directly for clarity if needed.  Staff  may message me via secure chat in Epic  but this may not receive an immediate response,  please page me for urgent matters!  If 7PM-7AM, please contact night coverage [email or web address]

## 2023-04-20 NOTE — Progress Notes (Signed)
RN and CN cleaned patient up. Patient requested a sandwich. RN gave the patient a sandwich and while she was setting the patient's food up for him to safely eat, he stated "you know where we are gonna go don't you" This RN stated where are you going to go?" The patient states " You are gonna get in this bed before I beat the dog snot out of you."  RN notified the CN of what the patient stated to this RN.

## 2023-04-20 NOTE — Plan of Care (Signed)
  Problem: Safety: Goal: Ability to remain free from injury will improve Outcome: Progressing   Problem: Education: Goal: Knowledge of General Education information will improve Description: Including pain rating scale, medication(s)/side effects and non-pharmacologic comfort measures Outcome: Not Progressing  Pt remains on Comfort Measures Problem: Health Behavior/Discharge Planning: Goal: Ability to manage health-related needs will improve Outcome: Not Progressing   Problem: Clinical Measurements: Goal: Ability to maintain clinical measurements within normal limits will improve Outcome: Not Progressing Goal: Will remain free from infection Outcome: Not Progressing Goal: Diagnostic test results will improve Outcome: Not Progressing Goal: Respiratory complications will improve Outcome: Not Progressing Goal: Cardiovascular complication will be avoided Outcome: Not Progressing   Problem: Activity: Goal: Risk for activity intolerance will decrease Outcome: Not Progressing   Problem: Nutrition: Goal: Adequate nutrition will be maintained Outcome: Not Progressing   Problem: Coping: Goal: Level of anxiety will decrease Outcome: Not Progressing   Problem: Elimination: Goal: Will not experience complications related to bowel motility Outcome: Not Progressing Goal: Will not experience complications related to urinary retention Outcome: Not Progressing   Problem: Pain Managment: Goal: General experience of comfort will improve Outcome: Not Progressing   Problem: Skin Integrity: Goal: Risk for impaired skin integrity will decrease Outcome: Not Progressing   Problem: Fluid Volume: Goal: Hemodynamic stability will improve Outcome: Not Progressing   Problem: Clinical Measurements: Goal: Diagnostic test results will improve Outcome: Not Progressing Goal: Signs and symptoms of infection will decrease Outcome: Not Progressing   Problem: Respiratory: Goal: Ability to  maintain adequate ventilation will improve Outcome: Not Progressing   Problem: Education: Goal: Knowledge of discharge needs will improve Outcome: Not Progressing   Problem: Clinical Measurements: Goal: Postoperative complications will be avoided or minimized Outcome: Not Progressing   Problem: Respiratory: Goal: Ability to achieve and maintain a regular respiratory rate will improve Outcome: Not Progressing   Problem: Skin Integrity: Goal: Demonstration of wound healing without infection will improve Outcome: Not Progressing

## 2023-04-21 DIAGNOSIS — A419 Sepsis, unspecified organism: Secondary | ICD-10-CM | POA: Diagnosis not present

## 2023-04-21 DIAGNOSIS — E8729 Other acidosis: Secondary | ICD-10-CM | POA: Diagnosis not present

## 2023-04-21 DIAGNOSIS — Z515 Encounter for palliative care: Secondary | ICD-10-CM

## 2023-04-21 DIAGNOSIS — K810 Acute cholecystitis: Secondary | ICD-10-CM | POA: Diagnosis not present

## 2023-04-21 MED ORDER — LORAZEPAM 0.5 MG PO TABS
0.5000 mg | ORAL_TABLET | ORAL | Status: DC | PRN
  Administered 2023-04-21 – 2023-04-22 (×2): 0.5 mg via ORAL
  Filled 2023-04-21 (×2): qty 1

## 2023-04-21 MED ORDER — OLANZAPINE 5 MG PO TBDP
2.5000 mg | ORAL_TABLET | Freq: Four times a day (QID) | ORAL | Status: DC | PRN
  Administered 2023-04-21 – 2023-04-22 (×3): 2.5 mg via ORAL
  Filled 2023-04-21 (×5): qty 0.5

## 2023-04-21 NOTE — Progress Notes (Signed)
[facility] Liaison will continue to follow patient peripherally while waiting for a discharge disposition.    Please call with any Hospice related questions or concerns.    [facility] Liaison [phone]

## 2023-04-21 NOTE — Progress Notes (Signed)
Patient oriented to self, place and year. Patient requesting to eat rice crispy cereal, refuses ensure. RN gave the patient rice crispy cereal and orange juice.  Patient is intermittently tearful. Patient requested ativan. RN administered PO ativan.

## 2023-04-21 NOTE — Progress Notes (Signed)
Patient at 90% of his dinner and at 75% of sandwich tray this shift.

## 2023-04-21 NOTE — TOC Progression Note (Signed)
Transition of Care [facility]) - Progression Note    Patient Details  Name: Ryan Rose MRN: [id number] Date of Birth: [birth year]  Transition of [facility]) CM/SW Contact  Allena Katz, LCSW Phone Number: 04/21/2023, 10:03 AM  Clinical Narrative:     Admission paperwork emailed to [facility].         Expected Discharge Plan and Services                                               Social Determinants of Health (SDOH) Interventions SDOH Screenings   Food Insecurity: Patient Declined (03/21/2023)  Housing: Patient Declined (03/21/2023)  Transportation Needs: No Transportation Needs (03/21/2023)  Utilities: Not At Risk (03/21/2023)  Tobacco Use: High Risk (04/01/2023)    Readmission Risk Interventions     No data to display

## 2023-04-21 NOTE — Progress Notes (Signed)
PROGRESS NOTE Ryan Rose   WUJ:[id number] DOB: [birth year]  DOA: 03/21/2023 Date of Service: 04/21/23 PCP: Sherron Monday, MD  Brief Narrative / Hospital Course:  Ryan Rose is a 62 y.o. male with medical history significant of AAA status post repair and stenting, EtOH dependence, CAD, HTN, PVD,  They presented with persistent nauseous vomiting chest pain abdominal pain x1 day. He admitted he drinks occasionally and last drink was 2 days PTA.   Significant hospital events:  08/12: admitted to hospitalist service. Aortic dissection r/o, CTA (+)other concerns w/ esophageal thickening, chronic diverticulitis, severe diffuse hepatic steatosis.  08/15: Patient had upper endoscopy done which showed grade D esophagitis, large hiatal hernia and single nonbleeding angiectasia in the stomach treated with argon plasma. 08/16: Patient's hemoglobin dipped down to 6.5. Transfused 1 unit PRBC 08/18: Chest x-ray possible aspiration pneumonia.  Started on Unasyn. 08/20: Fever, hypotensive, tachycardia.  Patient given sepsis fluid bolus.  Antibiotics changed over to Maxipime and Flagyl.  MRCP ordered.  In the afternoon and blood pressure dropped down into the 70s and not taking midodrine, transfer to the stepdown for closer monitoring. 08/23: cholecystostomy tube placement  Since then and to 04/16/23 - difficult placement, concern for dementia and behavioral difficulty d/t this. Intermittent urinary retention, avoiding Foley d/t confusion and pt has removed IV lines etc. Has not been eating much, not good candidate for feeding tube, palliative care following peripherally.  9/07-present: decision made for comfort measures status with his wife. He remains stable is is awaiting placement.    Consultants:  Gastroenterology  PCCU General Surgery Palliative Care Interventional Radiology    Procedures: Upper endoscopy 08/15 which showed grade D esophagitis, large hiatal hernia and single  nonbleeding angiectasia in the stomach treated with argon plasma.    MRCP 08/20--Severe diffuse fatty infiltration of the liver. No focal hepatic lesions are identified without contrast. 2. Distended gallbladder with layering sludge. No definite gallstones. Gallbladder wall thickening and pericholecystic inflammatory changes could suggest acalculus cholecystitis or cholestasis. Normal caliber and course of the common bile duct. 3. Small bilateral pleural effusions with overlying atelectasis. There is also a cystic appearing fluid collection near the distal esophagus on the right side. This was not present on the prior CT scan and could be loculated pleural fluid. 4. Moderate mesenteric edema and small volume ascites.   Cholecystotomy tube placement 08/23    ASSESSMENT & PLAN:   Principal Problem:   Severe sepsis (HCC) Active Problems:   Acute blood loss anemia   Alcohol withdrawal delirium (HCC)   Acute upper GI bleed   Aspiration pneumonia (HCC)   Alcohol withdrawal (HCC)   Nausea & vomiting   Hypophosphatemia   Hypomagnesemia   Hypokalemia   Alcoholic ketoacidosis   PVD (peripheral vascular disease) (HCC)   Transaminitis   Macrocytic anemia   Abdominal aortic aneurysm (AAA) (HCC)   Leukocytosis   Alcohol abuse   Bilirubinemia   Hyponatremia   Arterial hypotension   Acute acalculous cholecystitis   Malnutrition of moderate degree   Advanced care planning Discussion 09/07 over the phone w/ his wife - see progress note for that day  Have updated to comfort measures status Will seek hospice placement instead of SNF rehab Palliative following   Severe sepsis (HCC) Acalculus cholecystitis S/p drain  cont drain management   Alcohol withdrawal delirium  Dementia suspect alcohol related Received 2 days of high-dose thiamine.   Right now patient does not have capacity make medical decisions.  confused at  baseline however is redirected well. Comfort measures    Acute blood loss anemia Macrocytic anemia s/p 2u PRBC Stopping blood draws    Acute upper GI bleed EGD on 8/15 (+)grade D esophagitis, hiatal hernia and angiectasia which was treated with argon plasma coagulation.  Can restart PPI to prevent GERD, if he will take pills    Aspiration pneumonia - resolved  on abx, treated    Hypophosphatemia  Hypomagnesemia Hypokalemia Leukocytosis  Transaminitis  Stopping blood draws    Alcoholic ketoacidosis Resolved   PVD (peripheral vascular disease)  Stopping medications   Abdominal aortic aneurysm  No endoleak on CTA on admission. Comfort measures   Acute urinary retention, intermittent- 2.7L UOP in last 24 hours cont bladder scan q6h, if post-void >500 ml, then I/O cath.  Try to avoid Foley placement since there is a high risk of pt pulling it out. try to position pt upright to help pt void   Poor oral intake- comfort feeds Stopped po meds    Current smoker pt persistently asked to go smoke Nicotine patch    DVT prophylaxis: lovenox to d/c on comfort measures IV fluids: no continuous IV fluids  Nutrition: regular diet, he has poor po intake  Central lines / invasive devices: cholecystostomy tube    Code Status: DNR ACP documentation reviewed: 04/15/23 MOST form [facility] on [facility] in [facility]   Current Admission Status: inpatient, Med/Surg  LOS: 29 TOC needs: placement Barriers to discharge / significant pending items: placement   Subjective / Brief ROS:  Patient reports no complaints today. He is currently eating breakfast.   Family Communication: none at this time   Objective Findings: Blood pressure 110/86, pulse (!) 110, temperature 97.6 F (36.4 C), resp. rate 18, height 5\' 9"  (1.753 m), weight 73.8 kg, SpO2 97%.  Examination:  Physical Exam Vitals and nursing note reviewed.  Constitutional:      General: He is not in acute distress. HENT:     Head: Normocephalic.     Mouth/Throat:     Mouth: Mucous membranes are  moist.  Pulmonary:     Effort: Pulmonary effort is normal.  Abdominal:     Palpations: Abdomen is soft.     Tenderness: There is no abdominal tenderness.     Comments: Drain intact  Skin:    General: Skin is warm and dry.     Capillary Refill: Capillary refill takes less than 2 seconds.  Neurological:     Mental Status: He is alert. Mental status is at baseline.  Psychiatric:        Mood and Affect: Mood normal.    Scheduled Medications:   feeding supplement  237 mL Oral TID BM   nicotine  14 mg Transdermal Daily   pneumococcal 20-valent conjugate vaccine  0.5 mL Intramuscular Tomorrow-1000   sodium chloride flush  5 mL Intracatheter Q8H   PRN Medications:  [DISCONTINUED] glycopyrrolate **OR** glycopyrrolate, ipratropium-albuterol, LORazepam, LORazepam, OLANZapine zydis, ondansetron (ZOFRAN) IV, ondansetron, mouth rinse, phenol, polyvinyl alcohol, promethazine (PHENERGAN) injection (IM or IVPB), ziprasidone   LOS: 31 days    Ryan Bock, DO [facility] 04/21/2023, 7:37 AM   If 7PM-7AM, please contact night coverage [email or web address]
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
## 2023-04-21 NOTE — Progress Notes (Signed)
Palliative Care Progress Note, Assessment & Plan   Patient Name: Ryan Rose       Date: 04/21/2023 DOB: [birth year]  Age: 62 y.o. MRN#: [id number] Attending Physician: [name], MD Primary Care Physician: Sherron Monday, MD Admit Date: 03/21/2023  Subjective: Patient is lying in bed, sleeping, in no apparent distress.  He easily awakens.  He acknowledges my presence and is able to make his wishes known.  No family or friends present during my visit.  HPI: Ryan Rose is a 62 y.o. male with medical history significant of AAA status post repair and stenting, alcohol abuse, CAD, HTN, PVD, presented with persistent nauseous vomiting chest pain abdominal pain x1 day. He admitted he drinks occasionally and last drink was 2 days PTA.   Significant hospital events:  08/12: admitted to hospitalist service. Aortic dissection r/o, CTA (+)other concerns w/ esophageal thickening, chronic diverticulitis, severe diffuse hepatic steatosis.  08/15: Patient had upper endoscopy done which showed grade D esophagitis, large hiatal hernia and single nonbleeding angiectasia in the stomach treated with argon plasma. 08/16: Patient's hemoglobin dipped down to 6.5. Transfused 1 unit PRBC 08/18: Chest x-ray possible aspiration pneumonia.  Started on Unasyn. 08/20: Fever, hypotensive, tachycardia.  Patient given sepsis fluid bolus.  Antibiotics changed over to Maxipime and Flagyl.  MRCP ordered.  In the afternoon and blood pressure dropped down into the 70s and not taking midodrine, transfer to the stepdown for closer monitoring. 08/23: cholecystostomy tube placement  Since then and to 04/16/23 - difficult placement, concern for dementia and behavioral difficulty d/t this. Intermittent urinary retention,  avoiding Foley d/t confusion and pt has removed IV lines etc. Has not been eating much, not good candidate for feeding tube, palliative care following peripherally.  09/07: decision made for comfort measures status with his wife  09/08- 09/11: TOC working on placement   Summary of counseling/coordination of care: After reviewing the patient's chart and assessing the patient at bedside, I assessed patient's symptoms.  Patient denies pain or discomfort at this time.  Physical examination was limited as patient asked me not to touch him and asked me to go away.  P.o. intake encouraged.  Patient said he would eat later.  I highlighted the patient has not had anything to eat or drink for several days.  He shares that he is not hungry at this time and will probably try to eat something later.  After assessing the patient, I counseled with dayshift RN Amandeep.  She endorses patient has had no issues overnight and that as needed medications available are appropriately managing patient's symptoms at this time. No adjustment to [facility] needed.  TOC following for disposition planning.  Full comfort measures continue.  PMT will continue to follow and support patient throughout his hospitalization.  Physical Exam Vitals reviewed.  Constitutional:      General: He is not in acute distress.    Appearance: He is not ill-appearing.  Eyes:     Pupils: Pupils are equal, round, and reactive to light.  Abdominal:     Palpations: Abdomen is soft.  Skin:    General: Skin is warm and dry.     Coloration: Skin is pale.  Neurological:     Mental Status: He is alert.  Psychiatric:        Mood and Affect: Mood is not anxious.        Behavior: Behavior is not agitated.             Total Time 25 minutes   Ryan Antuna L. Bonita Quin, DNP, FNP-BC Palliative Medicine Team

## 2023-04-22 DIAGNOSIS — A419 Sepsis, unspecified organism: Secondary | ICD-10-CM | POA: Diagnosis not present

## 2023-04-22 DIAGNOSIS — R6520 Severe sepsis without septic shock: Secondary | ICD-10-CM | POA: Diagnosis not present

## 2023-04-22 DIAGNOSIS — E8729 Other acidosis: Secondary | ICD-10-CM | POA: Diagnosis not present

## 2023-04-22 DIAGNOSIS — K810 Acute cholecystitis: Secondary | ICD-10-CM | POA: Diagnosis not present

## 2023-04-22 MED ORDER — ACETAMINOPHEN 500 MG PO TABS: 500.0000 mg | ORAL_TABLET | Freq: Four times a day (QID) | ORAL | Status: DC | PRN

## 2023-04-22 MED ORDER — OXYCODONE HCL 5 MG PO TABS: 5.0000 mg | ORAL_TABLET | ORAL | Status: DC | PRN

## 2023-04-22 NOTE — Progress Notes (Signed)
PROGRESS NOTE Ryan Rose   JWJ:[id number] DOB: 1961/07/17  DOA: 03/21/2023 Date of Service: 04/22/23 PCP: Sherron Monday, MD  Brief Narrative / Hospital Course:  Ryan Rose is a 62 y.o. male with medical history significant of AAA status post repair and stenting, EtOH dependence, CAD, HTN, PVD,  They presented with persistent nauseous vomiting chest pain abdominal pain x1 day. He admitted he drinks occasionally and last drink was 2 days PTA.   Significant hospital events:  08/12: admitted to hospitalist service. Aortic dissection r/o, CTA (+)other concerns w/ esophageal thickening, chronic diverticulitis, severe diffuse hepatic steatosis.  08/15: Patient had upper endoscopy done which showed grade D esophagitis, large hiatal hernia and single nonbleeding angiectasia in the stomach treated with argon plasma. 08/16: Patient's hemoglobin dipped down to 6.5. Transfused 1 unit PRBC 08/18: Chest x-ray possible aspiration pneumonia.  Started on Unasyn. 08/20: Fever, hypotensive, tachycardia.  Patient given sepsis fluid bolus.  Antibiotics changed over to Maxipime and Flagyl.  MRCP ordered.  In the afternoon and blood pressure dropped down into the 70s and not taking midodrine, transfer to the stepdown for closer monitoring. 08/23: cholecystostomy tube placement  Since then and to 04/16/23 - difficult placement, concern for dementia and behavioral difficulty d/t this. Intermittent urinary retention, avoiding Foley d/t confusion and pt has removed IV lines etc. Has not been eating much, not good candidate for feeding tube, palliative care following peripherally.  9/07-present: decision made for comfort measures status with his wife. He remains stable and is awaiting placement.    Consultants:  Gastroenterology  [facility] General Surgery Palliative Care Interventional Radiology    Procedures: Upper endoscopy 08/15 which showed grade D esophagitis, large hiatal hernia and single  nonbleeding angiectasia in the stomach treated with argon plasma.  MRCP 8/20 Cholecystotomy tube placement 08/23    ASSESSMENT & PLAN: Principal Problem:   Severe sepsis (HCC) Active Problems:   Acute blood loss anemia   Alcohol withdrawal delirium (HCC)   Acute upper GI bleed   Aspiration pneumonia (HCC)   Alcohol withdrawal (HCC)   Nausea & vomiting   Hypophosphatemia   Hypomagnesemia   Hypokalemia   Alcoholic ketoacidosis   PVD (peripheral vascular disease) (HCC)   Transaminitis   Macrocytic anemia   Abdominal aortic aneurysm (AAA) (HCC)   Leukocytosis   Alcohol abuse   Bilirubinemia   Hyponatremia   Arterial hypotension   Acute acalculous cholecystitis   Malnutrition of moderate degree   Advanced care planning Discussion 09/07 over the phone w/ his wife - see progress note for that day  Have updated to comfort measures status Will seek hospice placement instead of SNF rehab Palliative, hospice following   Severe sepsis (HCC) Acalculus cholecystitis S/p drain  cont drain management   Alcohol withdrawal delirium  Dementia suspect alcohol related Received 2 days of high-dose thiamine.   Right now patient does not have capacity make medical decisions.  confused at baseline however is redirected well. Comfort measures   Acute blood loss anemia Macrocytic anemia s/p 2u PRBC Stopping blood draws    Acute upper GI bleed EGD on 8/15 (+)grade D esophagitis, hiatal hernia and angiectasia which was treated with argon plasma coagulation.  Can restart PPI to prevent GERD, if he will take pills    Aspiration pneumonia - resolved  on abx, treated    Hypophosphatemia  Hypomagnesemia Hypokalemia Leukocytosis  Transaminitis  Stopping blood draws    Alcoholic ketoacidosis Resolved   PVD (peripheral vascular disease)  Stopping medications  Abdominal aortic aneurysm  No endoleak on CTA on admission. Comfort measures   Acute urinary retention, intermittent-  2.7L UOP in last 24 hours cont bladder scan q6h, if post-void >500 ml, then I/O cath.  Try to avoid Foley placement since there is a high risk of pt pulling it out. try to position pt upright to help pt void   Poor oral intake- comfort feeds Stopped po meds    Current smoker pt persistently asked to go smoke Nicotine patch    DVT prophylaxis: lovenox to d/c on comfort measures IV fluids: no continuous IV fluids  Nutrition: regular diet, he has poor po intake  Central lines / invasive devices: cholecystostomy tube    Code Status: DNR ACP documentation reviewed: 04/15/23 MOST form is on file in [facility]   Current Admission Status: inpatient, Med/Surg  LOS: 29 TOC needs: placement Barriers to discharge / significant pending items: placement   Subjective / Brief ROS:  Patient reports no complaints today. He is currently eating breakfast.   Family Communication: none at this time   Objective Findings: Blood pressure 110/86, pulse (!) 110, temperature 97.6 F (36.4 C), resp. rate 18, height 5\' 9"  (1.753 m), weight 73.8 kg, SpO2 97%.  Examination:  Physical Exam Vitals and nursing note reviewed.  Constitutional:      General: He is not in acute distress. HENT:     Head: Normocephalic.     Mouth/Throat:     Mouth: Mucous membranes are moist.  Pulmonary:     Effort: Pulmonary effort is normal.  Abdominal:     Palpations: Abdomen is soft.     Tenderness: There is no abdominal tenderness.     Comments: Drain intact  Skin:    General: Skin is warm and dry.     Capillary Refill: Capillary refill takes less than 2 seconds.  Neurological:     Mental Status: He is alert. Mental status is at baseline.  Psychiatric:        Mood and Affect: Mood normal.    Scheduled Medications:   feeding supplement  237 mL Oral TID BM   nicotine  14 mg Transdermal Daily   pneumococcal 20-valent conjugate vaccine  0.5 mL Intramuscular Tomorrow-1000   sodium chloride flush  5 mL Intracatheter  Q8H   PRN Medications:  [DISCONTINUED] glycopyrrolate **OR** glycopyrrolate, ipratropium-albuterol, LORazepam, LORazepam, OLANZapine zydis, ondansetron (ZOFRAN) IV, ondansetron, mouth rinse, phenol, polyvinyl alcohol, promethazine (PHENERGAN) injection (IM or IVPB), ziprasidone   LOS: 32 days    Ryan Bock, DO [facility] 04/22/2023, 7:29 AM   If 7PM-7AM, please contact night coverage [email or web address]

## 2023-04-22 NOTE — Progress Notes (Addendum)
Palliative Care Progress Note, Assessment & Plan   Patient Name: Ryan Rose       Date: 04/22/2023 DOB: Apr 01, 1961  Age: 62 y.o. MRN#: [id number] Attending Physician: [name], MD Primary Care Physician: Sherron Monday, MD Admit Date: 03/21/2023  Subjective: Patient is lying in bed with blanket over his head.  He allows me to remove it.  He is asleep but easily awakens.  He acknowledges my presence and is able to make his wishes known.  Family or friends present during my visit.  HPI: Ryan Rose is a 62 y.o. male with medical history significant of AAA status post repair and stenting, alcohol abuse, CAD, HTN, PVD, presented with persistent nauseous vomiting chest pain abdominal pain x1 day. He admitted he drinks occasionally and last drink was 2 days PTA.   Significant hospital events:  08/12: admitted to hospitalist service. Aortic dissection r/o, CTA (+)other concerns w/ esophageal thickening, chronic diverticulitis, severe diffuse hepatic steatosis.  08/15: Patient had upper endoscopy done which showed grade D esophagitis, large hiatal hernia and single nonbleeding angiectasia in the stomach treated with argon plasma. 08/16: Patient's hemoglobin dipped down to 6.5. Transfused 1 unit PRBC 08/18: Chest x-ray possible aspiration pneumonia.  Started on Unasyn. 08/20: Fever, hypotensive, tachycardia.  Patient given sepsis fluid bolus.  Antibiotics changed over to Maxipime and Flagyl.  MRCP ordered.  In the afternoon and blood pressure dropped down into the 70s and not taking midodrine, transfer to the stepdown for closer monitoring. 08/23: cholecystostomy tube placement  Since then and to 04/16/23 - difficult placement, concern for dementia and behavioral difficulty d/t this.  Intermittent urinary retention, avoiding Foley d/t confusion and pt has removed IV lines etc. Has not been eating much, not good candidate for feeding tube, palliative care following peripherally.  09/07: decision made for comfort measures status with his wife  09/08- 09/11: TOC working on placement   Summary of counseling/coordination of care: After reviewing the patient's chart and assessing the patient at bedside, I spoke with patient in regards to symptom management and plan of care.  Symptoms assessed.  Patient repeats that he "feels bad" but cannot specify or further define what feeling bad means to him.  Physical assessment revealed no pain with palpation or movement.  Discussed importance of functional and nutritional status in addition to patient's cognitive abilities as an significant indicators of his overall prognosis.  I reminded patient that he has not had adequate p.o. intake for several days now.  He shares that he is hungry and ready to eat.  I set up his breakfast tray for him.  He shares he will eat it later.  Multiple attempts made to help feed the patient and he declined.  He took one sip of orange juice and said he would eat his food later.  I again highlighted the importance of nutritional intake to sustain life and patient continued to decline PO intake during my visit.  When asked if he is married, he says he thought he was.  He is unable to tell me the name of his wife or how long he has been married.  He says there are 2 dogs fighting in his  room that need to be put away in their cages.  He also shares that my "compadre is out to get me", but declines to further explain his comments. Patient is awake and alert but still lacks capacity for complex medical decision making.    Comfort measures continue. TOC following closely for disposition planning.   PMT will continue to follow for symptom management of full comfort measures.   Physical Exam Vitals reviewed.   Constitutional:      General: He is not in acute distress. HENT:     Head: Normocephalic.  Eyes:     Pupils: Pupils are equal, round, and reactive to light.  Musculoskeletal:     Cervical back: Normal range of motion.     Comments: MAETC, generalized weakness  Skin:    General: Skin is warm and dry.  Neurological:     Mental Status: He is alert.  Psychiatric:        Mood and Affect: Mood is not anxious.        Behavior: Behavior is not agitated.             Total Time 25 minutes   Tor Tsuda L. Bonita Quin, DNP, FNP-BC Palliative Medicine Team

## 2023-04-22 NOTE — Plan of Care (Signed)

## 2023-04-22 NOTE — TOC Progression Note (Signed)
Transition of Care [facility]) - Progression Note    Patient Details  Name: Ryan Rose MRN: [id number] Date of Birth: [birth year]  Transition of [facility]) CM/SW Contact  Allena Katz, LCSW Phone Number: 04/22/2023, 10:16 AM  Clinical Narrative:    [phone] A PASSR.        Expected Discharge Plan and Services                                               Social Determinants of Health (SDOH) Interventions SDOH Screenings   Food Insecurity: Patient Declined (03/21/2023)  Housing: Patient Declined (03/21/2023)  Transportation Needs: No Transportation Needs (03/21/2023)  Utilities: Not At Risk (03/21/2023)  Tobacco Use: High Risk (04/01/2023)    Readmission Risk Interventions     No data to display

## 2023-04-23 DIAGNOSIS — A419 Sepsis, unspecified organism: Secondary | ICD-10-CM | POA: Diagnosis not present

## 2023-04-23 DIAGNOSIS — E8729 Other acidosis: Secondary | ICD-10-CM | POA: Diagnosis not present

## 2023-04-23 DIAGNOSIS — K810 Acute cholecystitis: Secondary | ICD-10-CM | POA: Diagnosis not present

## 2023-04-23 DIAGNOSIS — R6520 Severe sepsis without septic shock: Secondary | ICD-10-CM | POA: Diagnosis not present

## 2023-04-23 MED ORDER — NICOTINE 14 MG/24HR TD PT24: 14.0000 mg | MEDICATED_PATCH | Freq: Every day | TRANSDERMAL | Status: DC

## 2023-04-23 MED ORDER — LORAZEPAM 0.5 MG PO TABS: 0.5000 mg | ORAL_TABLET | ORAL | 0 refills | Status: AC | PRN

## 2023-04-23 MED ORDER — OXYCODONE HCL 5 MG PO TABS: 5.0000 mg | ORAL_TABLET | ORAL | 0 refills | Status: AC | PRN

## 2023-04-23 MED ORDER — OLANZAPINE 5 MG PO TBDP: 2.5000 mg | ORAL_TABLET | Freq: Four times a day (QID) | ORAL | Status: DC | PRN

## 2023-04-23 NOTE — Progress Notes (Signed)
Palliative Care Progress Note, Assessment & Plan   Patient Name: Ryan Rose       Date: 04/23/2023 DOB: 1960/12/07  Age: 62 y.o. MRN#: [id number] Attending Physician: [name], MD Primary Care Physician: Sherron Monday, MD Admit Date: 03/21/2023  Subjective: Patient is lying in bed in no apparent distress.  He is asleep but is easily awakened.  He acknowledges my presence and is able to make his wishes known.  His breakfast tray is at bedside.  Patient declines p.o. intake at this time.  No family or friends present during my visit.  HPI: Ryan Rose is a 62 y.o. male with medical history significant of AAA status post repair and stenting, alcohol abuse, CAD, HTN, PVD, presented with persistent nauseous vomiting chest pain abdominal pain x1 day. He admitted he drinks occasionally and last drink was 2 days PTA.   Significant hospital events:  08/12: admitted to hospitalist service. Aortic dissection r/o, CTA (+)other concerns w/ esophageal thickening, chronic diverticulitis, severe diffuse hepatic steatosis.  08/15: Patient had upper endoscopy done which showed grade D esophagitis, large hiatal hernia and single nonbleeding angiectasia in the stomach treated with argon plasma. 08/16: Patient's hemoglobin dipped down to 6.5. Transfused 1 unit PRBC 08/18: Chest x-ray possible aspiration pneumonia.  Started on Unasyn. 08/20: Fever, hypotensive, tachycardia.  Patient given sepsis fluid bolus.  Antibiotics changed over to Maxipime and Flagyl.  MRCP ordered.  In the afternoon and blood pressure dropped down into the 70s and not taking midodrine, transfer to the stepdown for closer monitoring. 08/23: cholecystostomy tube placement  Since then and to 04/16/23 - difficult placement, concern  for dementia and behavioral difficulty d/t this. Intermittent urinary retention, avoiding Foley d/t confusion and pt has removed IV lines etc. Has not been eating much, not good candidate for feeding tube, palliative care following peripherally.  09/07: decision made for comfort measures status with his wife  09/08- 09/13: TOC working on placement   Summary of counseling/coordination of care: Extensive chart review completed prior to meeting patient including labs, vital signs, imaging, progress notes, orders, and available advanced directive documents from current and previous encounters.   After reviewing the patient's chart and assessing the patient at bedside, I attempted to assess patient's symptom burden.  He denies pain or discomfort at this time.  He declined physical assessment saying that he "feels bad when you touch me".  No nonverbal signs of pain or discomfort noted.  No adjustment to more needed at this time.  Patient scheduled for discharge today.  PMT will continue to follow and remain available to patient throughout this hospitalization.  Full comfort measures continue.  Physical Exam Vitals reviewed.  Eyes:     Pupils: Pupils are equal, round, and reactive to light.  Pulmonary:     Effort: Pulmonary effort is normal.  Musculoskeletal:     Comments: Generalized weakness  Skin:    General: Skin is warm and dry.  Neurological:     Mental Status: He is alert.     Comments: Oriented to self  Psychiatric:        Mood and Affect: Mood is not anxious.  Behavior: Behavior is not agitated.             Total Time 25 minutes   Andrew Blasius L. Bonita Quin, DNP, FNP-BC Palliative Medicine Team

## 2023-04-23 NOTE — Progress Notes (Signed)
[facility]- Civil engineer, contracting  TOC, Darrian Shoffner, LCSW, informed [facility] that patient will discharge today to [facility]. Referrals team at [facility] notified so that hospice services can begin at the facility.    Please don't hesitate to call with any Hospice related questions or concerns.    Thank you for the opportunity to participate in this patient's care. [facility] Liaison [phone]

## 2023-04-23 NOTE — Plan of Care (Signed)
Pt being transferred to [facility] in [place], EMS here to transport pt, report called.  Problem: Education: Goal: Knowledge of General Education information will improve Description: Including pain rating scale, medication(s)/side effects and non-pharmacologic comfort measures Outcome: Adequate for Discharge   Problem: Health Behavior/Discharge Planning: Goal: Ability to manage health-related needs will improve Outcome: Adequate for Discharge   Problem: Clinical Measurements: Goal: Ability to maintain clinical measurements within normal limits will improve Outcome: Adequate for Discharge Goal: Will remain free from infection Outcome: Adequate for Discharge Goal: Diagnostic test results will improve Outcome: Adequate for Discharge Goal: Respiratory complications will improve Outcome: Adequate for Discharge Goal: Cardiovascular complication will be avoided Outcome: Adequate for Discharge   Problem: Activity: Goal: Risk for activity intolerance will decrease Outcome: Adequate for Discharge   Problem: Nutrition: Goal: Adequate nutrition will be maintained Outcome: Adequate for Discharge   Problem: Coping: Goal: Level of anxiety will decrease Outcome: Adequate for Discharge   Problem: Elimination: Goal: Will not experience complications related to bowel motility Outcome: Adequate for Discharge Goal: Will not experience complications related to urinary retention Outcome: Adequate for Discharge   Problem: Pain Managment: Goal: General experience of comfort will improve Outcome: Adequate for Discharge   Problem: Safety: Goal: Ability to remain free from injury will improve Outcome: Adequate for Discharge   Problem: Skin Integrity: Goal: Risk for impaired skin integrity will decrease Outcome: Adequate for Discharge   Problem: Fluid Volume: Goal: Hemodynamic stability will improve Outcome: Adequate for Discharge   Problem: Clinical Measurements: Goal: Diagnostic  test results will improve Outcome: Adequate for Discharge Goal: Signs and symptoms of infection will decrease Outcome: Adequate for Discharge   Problem: Respiratory: Goal: Ability to maintain adequate ventilation will improve Outcome: Adequate for Discharge   Problem: Education: Goal: Knowledge of discharge needs will improve Outcome: Adequate for Discharge   Problem: Clinical Measurements: Goal: Postoperative complications will be avoided or minimized Outcome: Adequate for Discharge   Problem: Respiratory: Goal: Ability to achieve and maintain a regular respiratory rate will improve Outcome: Adequate for Discharge   Problem: Skin Integrity: Goal: Demonstration of wound healing without infection will improve Outcome: Adequate for Discharge   Problem: Education: Goal: Knowledge of the prescribed therapeutic regimen will improve Outcome: Adequate for Discharge   Problem: Coping: Goal: Ability to identify and develop effective coping behavior will improve Outcome: Adequate for Discharge   Problem: Clinical Measurements: Goal: Quality of life will improve Outcome: Adequate for Discharge   Problem: Respiratory: Goal: Verbalizations of increased ease of respirations will increase Outcome: Adequate for Discharge   Problem: Role Relationship: Goal: Family's ability to cope with current situation will improve Outcome: Adequate for Discharge Goal: Ability to verbalize concerns, feelings, and thoughts to partner or family member will improve Outcome: Adequate for Discharge   Problem: Pain Management: Goal: Satisfaction with pain management regimen will improve Outcome: Adequate for Discharge

## 2023-04-23 NOTE — Progress Notes (Incomplete)
PROGRESS NOTE GAYLE LEFRANCOIS   HQI:[id number] DOB: 1960-08-25  DOA: 03/21/2023 Date of Service: 04/23/23 PCP: Sherron Monday, MD  Brief Narrative / Hospital Course:  CROIX KLAVER is a 62 y.o. male with medical history significant of AAA status post repair and stenting, EtOH dependence, CAD, HTN, PVD,  They presented with persistent nauseous vomiting chest pain abdominal pain x1 day. He admitted he drinks occasionally and last drink was 2 days PTA.   Significant hospital events:  08/12: admitted to hospitalist service. Aortic dissection r/o, CTA (+)other concerns w/ esophageal thickening, chronic diverticulitis, severe diffuse hepatic steatosis.  08/15: Patient had upper endoscopy done which showed grade D esophagitis, large hiatal hernia and single nonbleeding angiectasia in the stomach treated with argon plasma. 08/16: Patient's hemoglobin dipped down to 6.5. Transfused 1 unit PRBC 08/18: Chest x-ray possible aspiration pneumonia.  Started on Unasyn. 08/20: Fever, hypotensive, tachycardia.  Patient given sepsis fluid bolus.  Antibiotics changed over to Maxipime and Flagyl.  MRCP ordered.  In the afternoon and blood pressure dropped down into the 70s and not taking midodrine, transfer to the stepdown for closer monitoring. 08/23: cholecystostomy tube placement  Since then and to 04/16/23 - difficult placement, concern for dementia and behavioral difficulty d/t this. Intermittent urinary retention, avoiding Foley d/t confusion and pt has removed IV lines etc. Has not been eating much, not good candidate for feeding tube, palliative care following peripherally.  9/07-present: decision made for comfort measures status with his wife. He remains stable and is awaiting placement.    Consultants:  Gastroenterology  [facility] General Surgery Palliative Care Interventional Radiology    Procedures: Upper endoscopy 08/15 which showed grade D esophagitis, large hiatal hernia and single  nonbleeding angiectasia in the stomach treated with argon plasma.  MRCP 8/20 Cholecystotomy tube placement 08/23    ASSESSMENT & PLAN: Principal Problem:   Severe sepsis (HCC) Active Problems:   Acute blood loss anemia   Alcohol withdrawal delirium (HCC)   Acute upper GI bleed   Aspiration pneumonia (HCC)   Alcohol withdrawal (HCC)   Nausea & vomiting   Hypophosphatemia   Hypomagnesemia   Hypokalemia   Alcoholic ketoacidosis   PVD (peripheral vascular disease) (HCC)   Transaminitis   Macrocytic anemia   Abdominal aortic aneurysm (AAA) (HCC)   Leukocytosis   Alcohol abuse   Bilirubinemia   Hyponatremia   Arterial hypotension   Acute acalculous cholecystitis   Malnutrition of moderate degree   Advanced care planning Discussion 09/07 over the phone w/ his wife - see progress note for that day  Have updated to comfort measures status Will seek hospice placement instead of SNF rehab Palliative, hospice following   Severe sepsis (HCC) Acalculus cholecystitis S/p drain  cont drain management   Alcohol withdrawal delirium  Dementia suspect alcohol related Received 2 days of high-dose thiamine.   Right now patient does not have capacity make medical decisions.  confused at baseline however is redirected well. Comfort measures   Acute blood loss anemia Macrocytic anemia s/p 2u PRBC Stopping blood draws    Acute upper GI bleed EGD on 8/15 (+)grade D esophagitis, hiatal hernia and angiectasia which was treated with argon plasma coagulation.  Can restart PPI to prevent GERD, if he will take pills    Aspiration pneumonia - resolved  on abx, treated    Hypophosphatemia  Hypomagnesemia Hypokalemia Leukocytosis  Transaminitis  Stopping blood draws    Alcoholic ketoacidosis Resolved   PVD (peripheral vascular disease)  Stopping medications  Abdominal aortic aneurysm  No endoleak on CTA on admission. Comfort measures   Acute urinary retention, intermittent-  1.5L UOP in last 24 hours cont bladder scan q6h, if post-void >500 ml, then I/O cath.  Try to avoid Foley placement since there is a high risk of pt pulling it out. try to position pt upright to help pt void   Poor oral intake- comfort feeds Stopped po meds    Current smoker pt persistently asked to go smoke Nicotine patch    DVT prophylaxis: lovenox to d/c on comfort measures IV fluids: no continuous IV fluids  Nutrition: regular diet, he has poor po intake  Central lines / invasive devices: cholecystostomy tube    Code Status: DNR ACP documentation reviewed: 04/15/23 MOST form is on [facility]   Current Admission Status: inpatient, Med/Surg  LOS: 29 TOC needs: placement Barriers to discharge / significant pending items: placement   Subjective / Brief ROS:  Patient reports no complaints today. He is currently eating breakfast.   Family Communication: none at this time   Objective Findings: Blood pressure 110/86, pulse (!) 110, temperature 97.6 F (36.4 C), resp. rate 18, height 5\' 9"  (1.753 m), weight 73.8 kg, SpO2 97%.  Examination:  Physical Exam Vitals and nursing note reviewed.  Constitutional:      General: He is not in acute distress. HENT:     Head: Normocephalic.     Mouth/Throat:     Mouth: Mucous membranes are moist.  Pulmonary:     Effort: Pulmonary effort is normal.  Abdominal:     Palpations: Abdomen is soft.     Tenderness: There is no abdominal tenderness.     Comments: Drain intact  Skin:    General: Skin is warm and dry.     Capillary Refill: Capillary refill takes less than 2 seconds.  Neurological:     Mental Status: He is alert. Mental status is at baseline.  Psychiatric:        Mood and Affect: Mood normal.    Scheduled Medications:   feeding supplement  237 mL Oral TID BM   nicotine  14 mg Transdermal Daily   pneumococcal 20-valent conjugate vaccine  0.5 mL Intramuscular Tomorrow-1000   PRN Medications:  acetaminophen,  [DISCONTINUED] glycopyrrolate **OR** glycopyrrolate, ipratropium-albuterol, LORazepam, LORazepam, OLANZapine zydis, ondansetron (ZOFRAN) IV, ondansetron, mouth rinse, oxyCODONE, phenol, polyvinyl alcohol, promethazine (PHENERGAN) injection (IM or IVPB), ziprasidone   LOS: 33 days    [name], DO [facility] 04/23/2023, 7:27 AM   If 7PM-7AM, please contact night coverage [email or web address]

## 2023-04-23 NOTE — Plan of Care (Signed)
Problem: Clinical Measurements: Goal: Will remain free from infection Outcome: Progressing Goal: Diagnostic test results will improve Outcome: Progressing   Problem: Activity: Goal: Risk for activity intolerance will decrease Outcome: Progressing   Problem: Coping: Goal: Level of anxiety will decrease Outcome: Progressing   Problem: Pain Managment: Goal: General experience of comfort will improve Outcome: Progressing

## 2023-04-23 NOTE — Discharge Summary (Addendum)
Physician Discharge Summary  Patient: Ryan Rose:[id number] DOB: 06-Aug-1961   Code Status: Do not attempt resuscitation (DNR) - Comfort care Admit date: 03/21/2023 Discharge date: 04/23/2023 Disposition: Skilled nursing facility, hospice PCP: Sherron Monday, MD  Recommendations for Outpatient Follow-up:  Follow up with hospice upon admission to SNF Follow up with general surgery, Ryan Lerner, MD outpatient in about 2-4 weeks to discuss if biliary drain needs to be removed.   Discharge Diagnoses:  Principal Problem:   Severe sepsis (HCC) Active Problems:   Acute blood loss anemia   Alcohol withdrawal delirium (HCC)   Acute upper GI bleed   Aspiration pneumonia (HCC)   Alcohol withdrawal (HCC)   Nausea & vomiting   Hypophosphatemia   Hypomagnesemia   Hypokalemia   Alcoholic ketoacidosis   PVD (peripheral vascular disease) (HCC)   Transaminitis   Macrocytic anemia   Abdominal aortic aneurysm (AAA) (HCC)   Leukocytosis   Alcohol abuse   Bilirubinemia   Hyponatremia   Arterial hypotension   Acute acalculous cholecystitis   Malnutrition of moderate degree   Comfort measures only status  Brief Hospital Course Summary: Ryan Rose is a 62 y.o. male with medical history significant of AAA status post repair and stenting, EtOH dependence, CAD, HTN, PVD,  They presented to the ED 8/12 with persistent nausea, vomiting, chest pain, abdominal pain x1 day. He admitted he drinks occasionally and last drink was 2 days PTA.   Significant hospital events:  08/12: admitted for Aortic dissection r/o which was negative. CTA positive for other concerns: esophageal thickening, chronic diverticulitis, severe diffuse hepatic steatosis. 08/15: upper endoscopy done which showed grade D esophagitis, large hiatal hernia and single nonbleeding angiectasia in the stomach treated with argon plasma. On prophylactic Abx.  08/16: Patient's hemoglobin decreased 6.5. Transfused 1  unit PRBC 08/18: Chest x-ray possible aspiration pneumonia.  Started on Unasyn. 08/20: Fever, hypotensive, tachycardia.  Patient given sepsis fluid bolus.  Antibiotics changed over to Maxipime and Flagyl.  MRCP ordered and showed: Severe diffuse fatty infiltration of the liver. No focal hepatic lesions are identified without contrast. Distended gallbladder with layering sludge. No definite gallstones. Gallbladder wall thickening and pericholecystic inflammatory changes could suggest acalculus cholecystitis or cholestasis. Normal caliber and course of the common bile duct. 08/23: cholecystostomy tube placement  8/24-present day. Patient has been medically stable for discharge but requiring increased level of care due to his worsening chronic illnesses including dementia, poor PO intake. Palliative care was consulted and after evaluation with patient and spouse and the consideration of his quality of life given current health, he was transitioned to comfort care on 9/7. Has been awaiting placement at SNF. 9/13: He is stable for discharge today to facility with hospice care following. He has biliary drain in place. He is oriented to himself only and asks me if he has to go to work tomorrow. Denies pain or complaints currently.   Discharge Condition: Stable, improved Recommended discharge diet: Regular healthy diet  Consultations: Palliative, hospice GI PCCM General surgery  IR  Procedures/Studies: EGD MRCP Cholecystectomy tube placement 8/23  Allergies as of 04/23/2023       Reactions   Sulfa Antibiotics Hives        Medication List     STOP taking these medications    aspirin 81 MG chewable tablet   atorvastatin 40 MG tablet Commonly known as: LIPITOR   clopidogrel 75 MG tablet Commonly known as: PLAVIX   losartan 25 MG tablet Commonly  known as: COZAAR   senna-docusate 8.6-50 MG tablet Commonly known as: Senokot-S       TAKE these medications    acetaminophen 500 MG  tablet Commonly known as: TYLENOL Take one or two tablet by mouth every 6  hours as needed for pain   LORazepam 0.5 MG tablet Commonly known as: ATIVAN Take 1 tablet (0.5 mg total) by mouth every 4 (four) hours as needed for up to 3 days for anxiety, sedation or sleep.   melatonin 3 MG Tabs tablet Take 3 mg by mouth at bedtime.   nicotine 14 mg/24hr patch Commonly known as: NICODERM CQ - dosed in mg/24 hours Place 1 patch (14 mg total) onto the skin daily. Start taking on: April 24, 2023   OLANZapine zydis 5 MG disintegrating tablet Commonly known as: ZYPREXA Take 0.5 tablets (2.5 mg total) by mouth every 6 (six) hours as needed for up to 3 days (Agitation - restlessness- hallucinations).   oxyCODONE 5 MG immediate release tablet Commonly known as: Oxy IR/ROXICODONE Take 1 tablet (5 mg total) by mouth every 4 (four) hours as needed for up to 5 days for severe pain.        Follow-up Information     Sherron Monday, MD. Schedule an appointment as soon as possible for a visit in 1 week(s).   Specialty: Internal Medicine Contact information: [address] [place] [address] [phone]         Ryan Lerner, MD. Schedule an appointment as soon as possible for a visit in 3 week(s).   Specialty: General Surgery Why: A calculus cholecystitis status post gallbladder drain placement Contact information: [address] [place] [address] [phone]         Pernell Dupre, MD Follow up in 6 week(s).   Specialties: Interventional Radiology, Diagnostic Radiology, Radiology Why: IR schedulers will contact patient/patient's family to schedule routine exchange in 6-8 weeks. Schedulers will give time/date/location of appointment Contact information: [address] [place] [address] [phone]                 Subjective   Pt reports no complaints today. He is alert and oriented to self only. Denies pain. He is happy to  hear that he doesn't have to go to work tomorrow  All questions and concerns were addressed at time of discharge.  Objective  Blood pressure (!) 126/91, pulse (!) 111, temperature 98.6 F (37 C), resp. rate 20, height 5\' 9"  (1.753 m), weight 66.8 kg, SpO2 97%.   General: Pt is alert, awake, not in acute distress. Frail appearing Cardiovascular: RRR, S1/S2 +, systolic murmur Respiratory: CTA bilaterally, no wheezing, no rhonchi Abdominal: Soft, NT, ND, bowel sounds +. R side drain without leakage at insertion site.  Extremities: no edema, no cyanosis. Low muscle mass  The results of significant diagnostics from this hospitalization (including imaging, microbiology, ancillary and laboratory) are listed below for reference.   Imaging studies: CT HEAD WO CONTRAST ( )  Result Date: 04/15/2023 CLINICAL DATA:  Initial evaluation for acute head trauma, minor. EXAM: CT HEAD WITHOUT CONTRAST TECHNIQUE: Contiguous axial images were obtained from the base of the skull through the vertex without intravenous contrast. RADIATION DOSE REDUCTION: This exam was performed according to the departmental dose-optimization program which includes automated exposure control, adjustment of the mA and/or kV according to patient size and/or use of iterative reconstruction technique. COMPARISON:  Prior study from 05/20/2021. FINDINGS: Brain: Age-related cerebral atrophy with moderate  chronic microvascular ischemic disease. No acute intracranial hemorrhage. No acute large vessel territory infarct. No mass lesion, midline shift or mass effect. No hydrocephalus or extra-axial fluid collection. Vascular: No abnormal hyperdense vessel. Scattered vascular calcifications noted within the carotid siphons. Skull: Scalp soft tissues demonstrate no acute finding. Calvarium intact. Sinuses/Orbits: Globes and orbital soft tissues within normal limits. Visualized paranasal sinuses and mastoid air cells are clear. Other: None. IMPRESSION:  1. No acute intracranial abnormality. 2. Age-related cerebral atrophy with moderate chronic microvascular ischemic disease. Electronically Signed   By: Rise Mu M.D.   On: 04/15/2023 00:04   IR US CHEST  Result Date: 04/05/2023 CLINICAL DATA:  142230 Pleural effusion 142230 EXAM: CHEST ULTRASOUND COMPARISON:  None Available. FINDINGS: Patient was placed in the lateral decubitus position for thoracentesis planning. Preprocedure imaging of the chest demonstrates a relatively small effusion by ultrasound. The patient was unable to be appropriately positioned for safe image guided thoracentesis. Procedure aborted. IMPRESSION: Relatively small right pleural effusion by ultrasound. The patient was unable to be appropriately positioned for safe image guided thoracentesis. Procedure not performed at this time. Electronically Signed   By: Olive Bass M.D.   On: 04/05/2023 15:27   US Venous Img Lower Bilateral (DVT)  Result Date: 04/03/2023 CLINICAL DATA:  Elevated D-dimer EXAM: BILATERAL LOWER EXTREMITY VENOUS DOPPLER ULTRASOUND TECHNIQUE: Gray-scale sonography with graded compression, as well as color Doppler and duplex ultrasound were performed to evaluate the lower extremity deep venous systems from the level of the common femoral vein and including the common femoral, femoral, profunda femoral, popliteal and calf veins including the posterior tibial, peroneal and gastrocnemius veins when visible. The superficial great saphenous vein was also interrogated. Spectral Doppler was utilized to evaluate flow at rest and with distal augmentation maneuvers in the common femoral, femoral and popliteal veins. COMPARISON:  None Available. FINDINGS: RIGHT LOWER EXTREMITY Common Femoral Vein: No evidence of thrombus. Normal compressibility, respiratory phasicity and response to augmentation. Saphenofemoral Junction: No evidence of thrombus. Normal compressibility and flow on color Doppler imaging. Profunda  Femoral Vein: No evidence of thrombus. Normal compressibility and flow on color Doppler imaging. Femoral Vein: No evidence of thrombus. Normal compressibility, respiratory phasicity and response to augmentation. Popliteal Vein: No evidence of thrombus. Normal compressibility, respiratory phasicity and response to augmentation. Calf Veins: No evidence of thrombus. Normal compressibility and flow on color Doppler imaging. Superficial Great Saphenous Vein: No evidence of thrombus. Normal compressibility. Venous Reflux:  None. Other Findings:  None. LEFT LOWER EXTREMITY Common Femoral Vein: No evidence of thrombus. Normal compressibility, respiratory phasicity and response to augmentation. Saphenofemoral Junction: No evidence of thrombus. Normal compressibility and flow on color Doppler imaging. Profunda Femoral Vein: No evidence of thrombus. Normal compressibility and flow on color Doppler imaging. Femoral Vein: No evidence of thrombus. Normal compressibility, respiratory phasicity and response to augmentation. Popliteal Vein: No evidence of thrombus. Normal compressibility, respiratory phasicity and response to augmentation. Calf Veins: No evidence of thrombus. Normal compressibility and flow on color Doppler imaging. Superficial Great Saphenous Vein: No evidence of thrombus. Normal compressibility. Venous Reflux:  None. Other Findings:  None. IMPRESSION: No evidence of deep venous thrombosis in either lower extremity. Electronically Signed   By: Malachy Moan M.D.   On: 04/03/2023 08:00   CT Angio Chest Pulmonary Embolism (PE) W or WO Contrast  Result Date: 04/03/2023 CLINICAL DATA:  Pulmonary embolism (PE) suspected, high prob EXAM: CT ANGIOGRAPHY CHEST WITH CONTRAST TECHNIQUE: Multidetector CT imaging of the chest was performed using the  standard protocol during bolus administration of intravenous contrast. Multiplanar CT image reconstructions and MIPs were obtained to evaluate the vascular anatomy.  RADIATION DOSE REDUCTION: This exam was performed according to the departmental dose-optimization program which includes automated exposure control, adjustment of the mA and/or kV according to patient size and/or use of iterative reconstruction technique. CONTRAST:  OMNIPAQUE IOHEXOL 350 MG/ML SOLN COMPARISON:  03/21/2023 FINDINGS: Cardiovascular: No filling defects in the pulmonary arteries to suggest pulmonary emboli. Heart is normal size. Aorta is normal caliber. Extensive coronary artery and moderate aortic calcifications. Mediastinum/Nodes: No mediastinal, hilar, or axillary adenopathy. Trachea and esophagus are unremarkable. Thyroid unremarkable. Lungs/Pleura: Moderate centrilobular emphysema. Moderate right pleural effusion and small left pleural effusion. Compressive atelectasis in the lower lobes. Right middle lobe atelectasis. Upper Abdomen: No acute findings. Diffuse low-density throughout the liver compatible with fatty infiltration. Musculoskeletal: Chest wall soft tissues are unremarkable. No acute bony abnormality. Review of the MIP images confirms the above findings. IMPRESSION: No evidence of pulmonary embolus. Extensive coronary artery disease. Moderate right pleural effusion and small left pleural effusion. Bibasilar compressive atelectasis. Aortic Atherosclerosis (ICD10-I70.0) and Emphysema (ICD10-J43.9). Hepatic steatosis. Electronically Signed   By: Charlett Nose M.D.   On: 04/03/2023 01:32   DG Chest Port 1 View  Result Date: 04/02/2023 CLINICAL DATA:  Shortness of breath. EXAM: PORTABLE CHEST 1 VIEW COMPARISON:  Radiograph 03/28/2023, CT 03/21/2023 FINDINGS: Chronic elevation of right hemidiaphragm. Overall lower lung volumes from prior exam. Minimal right pleural effusion. Stable heart size and mediastinal contours. No pulmonary edema or pneumothorax. IMPRESSION: Minimal right pleural effusion. Overall lower lung volumes from prior exam. Electronically Signed   By: Narda Rutherford  M.D.   On: 04/02/2023 21:50   IR Perc Cholecystostomy  Result Date: 04/02/2023 INDICATION: Acute cholecystitis EXAM: Placement of percutaneous cholecystostomy drainage catheter using ultrasound and fluoroscopic guidance MEDICATIONS: Documented in the EMR ANESTHESIA/SEDATION: Moderate (conscious) sedation was employed during this procedure. A total of Versed 1 mg and Fentanyl 50 mcg was administered intravenously by the radiology nurse. Total intra-service moderate Sedation Time: 10 minutes. The patient's level of consciousness and vital signs were monitored continuously by radiology nursing throughout the procedure under my direct supervision. FLUOROSCOPY: Radiation Exposure Index (as provided by the fluoroscopic device): 1.2 minutes (11 mGy) COMPLICATIONS: None immediate. PROCEDURE: Informed written consent was obtained from the patient after a thorough discussion of the procedural risks, benefits and alternatives. All questions were addressed. Maximal Sterile Barrier Technique was utilized including caps, mask, sterile gowns, sterile gloves, sterile drape, hand hygiene and skin antiseptic. A timeout was performed prior to the initiation of the procedure. The patient was placed supine on the exam table. The right upper quadrant was prepped and draped in the standard sterile fashion. Ultrasound of the right upper quadrant was performed for planning purposes. This again demonstrated a distended gallbladder with wall edema and sludge, consistent with acute cholecystitis. An infracostal transhepatic approach was planned. Skin entry site was marked, and local analgesia was obtained with 1% lidocaine. Under ultrasound guidance, percutaneous access was obtained into the gallbladder via an infracostal transhepatic approach using a 21 gauge Chiba needle. Access was confirmed with visualization of needle tip within the gallbladder lumen, and free return of bile. An 018 Nitrex wire was then advanced through the access  needle and coiled within the gallbladder lumen. A transition dilator was advanced over this wire, through which an antegrade cholecystogram was performed. Antegrade cholecystogram demonstrated appropriate location in the gallbladder lumen. Over an Amplatz wire, the  percutaneous tract was serially dilated followed by placement of a 10 French locking multipurpose drainage catheter into the gallbladder lumen. Locking loop was formed. Additional biliary sludge was drained. Gentle hand injection of contrast material confirmed location of the gallbladder lumen. The drainage catheter was secured to the skin using silk suture and a dressing. It was placed to bag drainage. The patient tolerated the procedure well without immediate complication. IMPRESSION: Successful placement of a 10 French percutaneous cholecystostomy tube. Cholecystostomy tube placed to bag drainage. Electronically Signed   By: Olive Bass M.D.   On: 04/02/2023 13:06   US Abdomen Limited RUQ (LIVER/GB)  Result Date: 04/01/2023 CLINICAL DATA:  MRI demonstrating possible acalculous cholecystitis and nuclear medicine imaging demonstrating non filling of the gallbladder. EXAM: ULTRASOUND ABDOMEN LIMITED RIGHT UPPER QUADRANT COMPARISON:  MRI on 03/30/2023 and nuclear medicine hepatic biliary imaging earlier today. FINDINGS: Gallbladder: There is some dependent sludge in the gallbladder lumen without evidence of shadowing calculi. Gallbladder wall edema and a small amount of pericholecystic fluid is present. Gallbladder wall thickness is variable due to asymmetric edema with portions of the wall measuring up to approximately 7 mm. A sonographic Murphy's sign was not elicited. Common bile duct: Diameter: 5-8 mm. Liver: The liver is very echogenic, likely reflecting diffuse parenchymal steatosis. No overt cirrhotic contour morphology. No gross hepatic lesions or intrahepatic biliary ductal dilatation. Portal vein is patent on color Doppler imaging with  normal direction of blood flow towards the liver. Other: Trace free fluid adjacent to the liver. IMPRESSION: 1. Gallbladder sludge with gallbladder wall edema and small amount of pericholecystic fluid. No evidence of shadowing calculi. Findings are suggestive of acalculous cholecystitis. 2. Diffuse hepatic steatosis. 3. Trace free fluid adjacent to the liver. Electronically Signed   By: Irish Lack M.D.   On: 04/01/2023 15:56   NM Hepatobiliary Liver Func  Result Date: 04/01/2023 CLINICAL DATA:  Cholecystitis EXAM: NUCLEAR MEDICINE HEPATOBILIARY IMAGING TECHNIQUE: Sequential images of the abdomen were obtained out to 60 minutes following intravenous administration of radiopharmaceutical. RADIOPHARMACEUTICALS:  7.7 mCi Tc-32m  Choletec IV 2.5 mg morphine IV COMPARISON:  MRCP 03/30/2023 FINDINGS: Moderate delay in uptake of radiopharmaceutical from the blood pool with blood pool still visible at 30 minutes. Biliary and bowel activity at 15 minutes indicating patency of the common hepatic duct and common bile duct. At 50 minutes, there is no gallbladder excretion. Subsequently, 2.5 mg of IV morphine was administered to the patient, with subsequent imaging demonstrating only biliary and bowel activity without readily appreciable gallbladder excretion. This appearance favors obstruction of the cystic duct and acute cholecystitis. IMPRESSION: 1. Failure of filling of the gallbladder even after IV morphine administration, most compatible with obstruction of the cystic duct and acute cholecystitis. 2. Persistent blood pool activity in a manner favoring mild to moderate hepatocellular dysfunction. Electronically Signed   By: Gaylyn Rong M.D.   On: 04/01/2023 12:53   MR 3D Recon At Scanner  Result Date: 03/30/2023 CLINICAL DATA:  Right upper quadrant abdominal pain. EXAM: MRI ABDOMEN WITHOUT CONTRAST  (INCLUDING MRCP) TECHNIQUE: Multiplanar multisequence MR imaging of the abdomen was performed. Heavily  T2-weighted images of the biliary and pancreatic ducts were obtained, and three-dimensional MRCP images were rendered by post processing. COMPARISON:  CT scan 03/21/2023 FINDINGS: Lower chest: Small bilateral pleural effusions with overlying atelectasis. No pericardial effusion. There is also a cystic appearing fluid collection near the distal esophagus on the right side. This was not present on the prior CT scan and could  be loculated pleural fluid. Hepatobiliary: Severe diffuse fatty infiltration of the liver. No focal hepatic lesions are identified without contrast. No intrahepatic biliary dilatation. The gallbladder is distended measuring 5.1 cm in the transverse dimension. There is gallbladder wall thickening and pericholecystic inflammatory changes. There is layering high T1 material which is likely sludge. No definite gallstones. Normal caliber and course of the common bile duct. Pancreas:  No mass, inflammation or ductal dilatation. Spleen:  Normal size.  No focal lesions. Adrenals/Urinary Tract: The adrenal glands and kidneys are unremarkable. No renal lesions or hydronephrosis. Stomach/Bowel: The stomach, duodenum, visualized small bowel and visualized colon are unremarkable. Vascular/Lymphatic: Atherosclerotic disease involving the aorta with aortoiliac stent graft in place. No mesenteric or retroperitoneal mass or adenopathy. Other:  Moderate mesenteric edema and small volume ascites. Musculoskeletal: No significant bony findings. IMPRESSION: 1. Severe diffuse fatty infiltration of the liver. No focal hepatic lesions are identified without contrast. 2. Distended gallbladder with layering sludge. No definite gallstones. Gallbladder wall thickening and pericholecystic inflammatory changes could suggest acalculus cholecystitis or cholestasis. Normal caliber and course of the common bile duct. 3. Small bilateral pleural effusions with overlying atelectasis. There is also a cystic appearing fluid collection  near the distal esophagus on the right side. This was not present on the prior CT scan and could be loculated pleural fluid. 4. Moderate mesenteric edema and small volume ascites. Electronically Signed   By: Rudie Meyer M.D.   On: 03/30/2023 19:00   MR ABDOMEN MRCP WO CONTRAST  Result Date: 03/30/2023 CLINICAL DATA:  Right upper quadrant abdominal pain. EXAM: MRI ABDOMEN WITHOUT CONTRAST  (INCLUDING MRCP) TECHNIQUE: Multiplanar multisequence MR imaging of the abdomen was performed. Heavily T2-weighted images of the biliary and pancreatic ducts were obtained, and three-dimensional MRCP images were rendered by post processing. COMPARISON:  CT scan 03/21/2023 FINDINGS: Lower chest: Small bilateral pleural effusions with overlying atelectasis. No pericardial effusion. There is also a cystic appearing fluid collection near the distal esophagus on the right side. This was not present on the prior CT scan and could be loculated pleural fluid. Hepatobiliary: Severe diffuse fatty infiltration of the liver. No focal hepatic lesions are identified without contrast. No intrahepatic biliary dilatation. The gallbladder is distended measuring 5.1 cm in the transverse dimension. There is gallbladder wall thickening and pericholecystic inflammatory changes. There is layering high T1 material which is likely sludge. No definite gallstones. Normal caliber and course of the common bile duct. Pancreas:  No mass, inflammation or ductal dilatation. Spleen:  Normal size.  No focal lesions. Adrenals/Urinary Tract: The adrenal glands and kidneys are unremarkable. No renal lesions or hydronephrosis. Stomach/Bowel: The stomach, duodenum, visualized small bowel and visualized colon are unremarkable. Vascular/Lymphatic: Atherosclerotic disease involving the aorta with aortoiliac stent graft in place. No mesenteric or retroperitoneal mass or adenopathy. Other:  Moderate mesenteric edema and small volume ascites. Musculoskeletal: No  significant bony findings. IMPRESSION: 1. Severe diffuse fatty infiltration of the liver. No focal hepatic lesions are identified without contrast. 2. Distended gallbladder with layering sludge. No definite gallstones. Gallbladder wall thickening and pericholecystic inflammatory changes could suggest acalculus cholecystitis or cholestasis. Normal caliber and course of the common bile duct. 3. Small bilateral pleural effusions with overlying atelectasis. There is also a cystic appearing fluid collection near the distal esophagus on the right side. This was not present on the prior CT scan and could be loculated pleural fluid. 4. Moderate mesenteric edema and small volume ascites. Electronically Signed   By: Orlene Plum.D.  On: 03/30/2023 19:00   DG Chest Port 1 View  Result Date: 03/28/2023 CLINICAL DATA:  Fever and altered mental status EXAM: PORTABLE CHEST 1 VIEW COMPARISON:  Chest radiograph dated March 25, 2023 FINDINGS: Cardiac and mediastinal contours are within normal limits. Elevation of the right hemidiaphragm. Mild bibasilar opacities. No large pleural effusion or evidence of pneumothorax. IMPRESSION: Mild bibasilar opacities, likely atelectasis, although infection or aspiration could appear similar. Electronically Signed   By: Allegra Lai M.D.   On: 03/28/2023 12:32   DG Chest Port 1 View  Result Date: 03/25/2023 CLINICAL DATA:  Fever EXAM: PORTABLE CHEST 1 VIEW COMPARISON:  None Available. FINDINGS: Lungs are clear.  No pleural effusion or pneumothorax. The heart is normal in size. IMPRESSION: No acute cardiopulmonary disease. Electronically Signed   By: Charline Bills M.D.   On: 03/25/2023 17:04    Labs: Basic Metabolic Panel: Recent Labs  Lab 04/17/23 0454 04/17/23 0455 04/18/23 0905  NA 137  --   --   K 3.5  --   --   CL 107  --   --   CO2 21*  --   --   GLUCOSE 92  --   --   BUN 6*  --   --   CREATININE <0.30*  --   --   CALCIUM 7.1*  --   --   MG  --  1.6* 1.5*    CBC: No results for input(s): "WBC", "NEUTROABS", "HGB", "HCT", "MCV", "PLT" in the last 168 hours. Microbiology: Results for orders placed or performed during the hospital encounter of 03/21/23  Culture, blood (Routine X 2) w Reflex to ID Panel     Status: None   Collection Time: 03/30/23  8:29 AM   Specimen: BLOOD LEFT HAND  Result Value Ref Range Status   Specimen Description BLOOD LEFT HAND  Final   Special Requests   Final    BOTTLES DRAWN AEROBIC AND ANAEROBIC Blood Culture adequate volume   Culture   Final    NO GROWTH 5 DAYS Performed at [facility], [address] [place], [place] [address]    Report Status 04/04/2023 FINAL  Final  Culture, blood (Routine X 2) w Reflex to ID Panel     Status: None   Collection Time: 03/30/23 11:18 AM   Specimen: BLOOD LEFT ARM  Result Value Ref Range Status   Specimen Description BLOOD LEFT ARM  Final   Special Requests   Final    BOTTLES DRAWN AEROBIC AND ANAEROBIC Blood Culture adequate volume   Culture   Final    NO GROWTH 5 DAYS Performed at [facility], [address], [place], [place] [address]    Report Status 04/04/2023 FINAL  Final  MRSA Next Gen by PCR, Nasal     Status: None   Collection Time: 03/30/23  7:01 PM   Specimen: Nasal Mucosa; Nasal Swab  Result Value Ref Range Status   MRSA by PCR Next Gen NOT DETECTED NOT DETECTED Final    Comment: (NOTE) The GeneXpert MRSA Assay (FDA approved for NASAL specimens only), is one component of a comprehensive MRSA colonization surveillance program. It is not intended to diagnose MRSA infection nor to guide or monitor treatment for MRSA infections. Test performance is not FDA approved in patients less than 65 years old. Performed at [facility], [address] [place], [place] [address]   Urine Culture (for pregnant, neutropenic or urologic patients or patients with an indwelling urinary  catheter)     Status: None   Collection Time: 04/12/23   2:45 PM   Specimen: In/Out Cath Urine  Result Value Ref Range Status   Specimen Description   Final    IN/OUT CATH URINE Performed at [facility], [address], [place], [place] [address]    Special Requests   Final    NONE Performed at [facility], [address] [place], [place] [address]    Culture   Final    NO GROWTH Performed at [facility], [address], [place], [place] [address]    Report Status 04/13/2023 FINAL  Final   Time coordinating discharge: Over 30 minutes  [name], MD  [facility] 04/23/2023, 10:43 AM
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
## 2023-04-23 NOTE — TOC Transition Note (Addendum)
Transition of Care [facility]) - CM/SW Discharge Note   Patient Details  Name: NOTNAMED SWOVELAND MRN: [id number] Date of Birth: 1961/01/13  [facility] CM/SW Contact:  Allena Katz, LCSW Phone Number: 04/23/2023, 9:39 AM   Clinical Narrative:   Pt to discharge to [facility] in [place]. [name] with [facility] accepted LOG. CSW to contact wife and send DC summary once in. RN given number for report. CSW to call [facility] once pt is ready to be discharged.  [facility] notified and significant other [name] notified.     Final next level of care: Skilled Nursing Facility Barriers to Discharge: Barriers Resolved   Patient Goals and CMS Choice CMS Medicare.gov Compare Post Acute Care list provided to:: Patient Choice offered to / list presented to : Patient  Discharge Placement PASRR number recieved: 04/13/23 PASRR number recieved: 04/13/23              Patient to be transferred to facility by: [facility] Name of family member notified: tammy Patient and family notified of of transfer: 04/23/23  Discharge Plan and Services Additional resources added to the After Visit Summary for                                       Social Determinants of Health (SDOH) Interventions SDOH Screenings   Food Insecurity: Patient Declined (03/21/2023)  Housing: Patient Declined (03/21/2023)  Transportation Needs: No Transportation Needs (03/21/2023)  Utilities: Not At Risk (03/21/2023)  Tobacco Use: High Risk (04/01/2023)     Readmission Risk Interventions     No data to display

## 2023-04-23 NOTE — Progress Notes (Signed)
Report called to [facility] report given to [employer]. EMS here to transport pt to Facility.

## 2023-04-24 DIAGNOSIS — E559 Vitamin D deficiency, unspecified: Secondary | ICD-10-CM | POA: Diagnosis not present

## 2023-04-24 DIAGNOSIS — I10 Essential (primary) hypertension: Secondary | ICD-10-CM | POA: Diagnosis not present

## 2023-04-24 DIAGNOSIS — E119 Type 2 diabetes mellitus without complications: Secondary | ICD-10-CM | POA: Diagnosis not present

## 2023-04-26 DIAGNOSIS — E559 Vitamin D deficiency, unspecified: Secondary | ICD-10-CM | POA: Diagnosis not present

## 2023-04-26 DIAGNOSIS — I10 Essential (primary) hypertension: Secondary | ICD-10-CM | POA: Diagnosis not present

## 2023-04-26 DIAGNOSIS — E119 Type 2 diabetes mellitus without complications: Secondary | ICD-10-CM | POA: Diagnosis not present

## 2023-04-27 ENCOUNTER — Other Ambulatory Visit: Payer: Self-pay | Admitting: Student

## 2023-04-27 DIAGNOSIS — K810 Acute cholecystitis: Secondary | ICD-10-CM

## 2023-04-28 ENCOUNTER — Encounter (HOSPITAL_COMMUNITY): Payer: Self-pay

## 2023-04-28 ENCOUNTER — Emergency Department (HOSPITAL_COMMUNITY)
Admission: EM | Admit: 2023-04-28 | Discharge: 2023-04-28 | Disposition: A | Discharge status: Skilled Nursing Facility | Payer: Self-pay | Attending: Emergency Medicine | Admitting: Emergency Medicine

## 2023-04-28 ENCOUNTER — Other Ambulatory Visit: Payer: Self-pay

## 2023-04-28 ENCOUNTER — Emergency Department (HOSPITAL_COMMUNITY): Payer: Self-pay

## 2023-04-28 DIAGNOSIS — T85520A Displacement of bile duct prosthesis, initial encounter: Secondary | ICD-10-CM | POA: Diagnosis not present

## 2023-04-28 DIAGNOSIS — I10 Essential (primary) hypertension: Secondary | ICD-10-CM | POA: Insufficient documentation

## 2023-04-28 DIAGNOSIS — I2510 Atherosclerotic heart disease of native coronary artery without angina pectoris: Secondary | ICD-10-CM | POA: Insufficient documentation

## 2023-04-28 DIAGNOSIS — T85590A Other mechanical complication of bile duct prosthesis, initial encounter: Secondary | ICD-10-CM | POA: Diagnosis not present

## 2023-04-28 NOTE — ED Provider Notes (Signed)
[facility] EMERGENCY DEPARTMENT AT [facility] Provider Note   CSN: [id number] Arrival date & time: 04/28/23  1316     History  Chief Complaint  Patient presents with   Nephrostomy Tube Problem    Ryan Rose is a 62 y.o. male.  62 year old male with prior medical history detailed below presents from [facility] nursing facility.  Patient reports that he may have pulled on his cholecystostomy tube yesterday.  There is a concern for dislodgment or displacement of his tube.  Tube continues to drain.  Patient is otherwise without complaint.   Patient with recent admission.  Discharged on September 13.  See summary of admission attached below:  Brief Hospital Course Summary: Ryan Rose is a 62 y.o. male with medical history significant of AAA status post repair and stenting, EtOH dependence, CAD, HTN, PVD,  They presented to the ED 8/12 with persistent nausea, vomiting, chest pain, abdominal pain x1 day. He admitted he drinks occasionally and last drink was 2 days PTA.   Significant hospital events:   08/12: admitted for Aortic dissection r/o which was negative. CTA positive for other concerns: esophageal thickening, chronic diverticulitis, severe diffuse hepatic steatosis.  08/15: upper endoscopy done which showed grade D esophagitis, large hiatal hernia and single nonbleeding angiectasia in the stomach treated with argon plasma. On prophylactic Abx.   08/16: Patient's hemoglobin decreased 6.5. Transfused 1 unit PRBC  08/18: Chest x-ray possible aspiration pneumonia.  Started on Unasyn.  08/20: Fever, hypotensive, tachycardia.  Patient given sepsis fluid bolus.  Antibiotics changed over to Maxipime and Flagyl.  MRCP ordered and showed: Severe diffuse fatty infiltration of the liver. No focal hepatic lesions are identified without contrast. Distended gallbladder with layering sludge. No definite gallstones. Gallbladder wall thickening and pericholecystic  inflammatory changes could suggest acalculus cholecystitis or cholestasis. Normal caliber and course of the common bile duct.  08/23: cholecystostomy tube placement   8/24-present day. Patient has been medically stable for discharge but requiring increased level of care due to his worsening chronic illnesses including dementia, poor PO intake. Palliative care was consulted and after evaluation with patient and spouse and the consideration of his quality of life given current health, he was transitioned to comfort care on 9/7. Has been awaiting placement at SNF.  9/13: He is stable for discharge today to facility with hospice care following. He has biliary drain in place. He is oriented to himself only and asks me if he has to go to work tomorrow. Denies pain or complaints currently.    The history is provided by the patient and medical records.       Home Medications Prior to Admission medications   Medication Sig Start Date End Date Taking? Authorizing Provider  acetaminophen (TYLENOL) 500 MG tablet Take one or two tablet by mouth every 6  hours as needed for pain 05/13/22     melatonin 3 MG TABS tablet Take 3 mg by mouth at bedtime.    [provider]  nicotine (NICODERM CQ - DOSED IN MG/24 HOURS) 14 mg/24hr patch Place 1 patch (14 mg total) onto the skin daily. 04/24/23   [name], MD  OLANZapine zydis (ZYPREXA) 5 MG disintegrating tablet Take 0.5 tablets (2.5 mg total) by mouth every 6 (six) hours as needed for up to 3 days (Agitation - restlessness- hallucinations). 04/23/23 04/26/23  [name], MD  oxyCODONE (OXY IR/ROXICODONE) 5 MG immediate release tablet Take 1 tablet (5 mg total) by mouth every  4 (four) hours as needed for up to 5 days for severe pain. 04/23/23 04/28/23  [name], MD      Allergies    Sulfa antibiotics    Review of Systems   Review of Systems  All other systems reviewed and are negative.   Physical Exam Updated Vital  Signs BP 117/82 (BP Location: Left Arm)   Pulse (!) 109   Temp 97.6 F (36.4 C) (Oral)   Resp 18   Ht 5\' 9"  (1.753 m)   Wt 66 kg   SpO2 94%   BMI 21.49 kg/m  Physical Exam Vitals and nursing note reviewed.  Constitutional:      General: He is not in acute distress.    Appearance: Normal appearance. He is well-developed.  HENT:     Head: Normocephalic and atraumatic.  Eyes:     Conjunctiva/sclera: Conjunctivae normal.     Pupils: Pupils are equal, round, and reactive to light.  Cardiovascular:     Rate and Rhythm: Normal rate and regular rhythm.     Heart sounds: Normal heart sounds.  Pulmonary:     Effort: Pulmonary effort is normal. No respiratory distress.     Breath sounds: Normal breath sounds.  Abdominal:     General: There is no distension.     Palpations: Abdomen is soft.     Tenderness: There is no abdominal tenderness.     Comments: Biliary drain in place.  See images below.  Musculoskeletal:        General: No deformity. Normal range of motion.     Cervical back: Normal range of motion and neck supple.  Skin:    General: Skin is warm and dry.  Neurological:     General: No focal deficit present.     Mental Status: He is alert and oriented to person, place, and time.     ED Results / Procedures / Treatments   Labs (all labs ordered are listed, but only abnormal results are displayed) Labs Reviewed - No data to display   EKG None  Radiology No results found.  Procedures Procedures    Medications Ordered in ED Medications - No data to display  ED Course/ Medical Decision Making/ A&P                                 Medical Decision Making Amount and/or Complexity of Data Reviewed Labs: ordered. Radiology: ordered.    Medical Screen Complete  This patient presented to the ED with complaint of concern for dislodgment of biliary drain.  This complaint involves an extensive number of treatment options. The initial differential diagnosis  includes, but is not limited to, displacement of biliary drain  This presentation is: Chronic, Self-Limited, Previously Undiagnosed, Uncertain Prognosis, and Complicated  Patient with recent admission with complicated course including placement of biliary drain.  Drain was placed on August 23.  Patient sent for evaluation of possible displacement of cholecystostomy drain.  Patient reports that he "tugged" on it yesterday.  Good drainage still appreciated.  IR consulted.  CT imaging obtained.  Drain appears to be appropriately positioned.  IR does not feel that patient requires additional manipulation of tube.  Patient is otherwise without complaint.  Patient is appropriate for discharge.  Importance of close follow-up is stressed.  Strict return precautions given and understood.   Additional history obtained:  External records from outside sources obtained and reviewed including prior  ED visits and prior Inpatient records.   Imaging Studies ordered:  I ordered imaging studies including CT abdomen pelvis I independently visualized and interpreted obtained imaging which showed NAD I agree with the radiologist interpretation.  Problem List / ED Course:  Evaluation of possible displacement of biliary drain   Reevaluation:  After the interventions noted above, I reevaluated the patient and found that they have: stayed the same    Disposition:  After consideration of the diagnostic results and the patients response to treatment, I feel that the patent would benefit from close outpatient follow-up.          Final Clinical Impression(s) / ED Diagnoses Final diagnoses:  Biliary drain displacement, initial encounter    Rx / DC Orders ED Discharge Orders     None         Wynetta Fines, MD 04/28/23 1513

## 2023-04-28 NOTE — ED Triage Notes (Signed)
Patient BIB [facility] from [facility]. Pulled out right sided nephrostomy tube yesterday. Has dementia and is on hospice.

## 2023-04-28 NOTE — Discharge Instructions (Addendum)
Return for any problem.  Your biliary drain is in place.  It was not dislodged.  If you notice any problems with the drain itself such as increased pain around the site, less drainage, fever return to the ED for evaluation.

## 2023-04-30 ENCOUNTER — Observation Stay (HOSPITAL_COMMUNITY)
Admission: EM | Admit: 2023-04-30 | Discharge: 2023-05-03 | Disposition: A | Discharge status: Hospice | Payer: Self-pay | Attending: Internal Medicine | Admitting: Internal Medicine

## 2023-04-30 ENCOUNTER — Emergency Department (HOSPITAL_COMMUNITY): Payer: Self-pay

## 2023-04-30 ENCOUNTER — Other Ambulatory Visit: Payer: Self-pay

## 2023-04-30 DIAGNOSIS — K529 Noninfective gastroenteritis and colitis, unspecified: Secondary | ICD-10-CM | POA: Diagnosis not present

## 2023-04-30 DIAGNOSIS — E876 Hypokalemia: Secondary | ICD-10-CM | POA: Diagnosis not present

## 2023-04-30 DIAGNOSIS — L89152 Pressure ulcer of sacral region, stage 2: Secondary | ICD-10-CM | POA: Insufficient documentation

## 2023-04-30 DIAGNOSIS — R109 Unspecified abdominal pain: Principal | ICD-10-CM | POA: Diagnosis present

## 2023-04-30 DIAGNOSIS — E871 Hypo-osmolality and hyponatremia: Secondary | ICD-10-CM | POA: Diagnosis present

## 2023-04-30 DIAGNOSIS — I110 Hypertensive heart disease with heart failure: Secondary | ICD-10-CM | POA: Insufficient documentation

## 2023-04-30 DIAGNOSIS — Z79899 Other long term (current) drug therapy: Secondary | ICD-10-CM | POA: Insufficient documentation

## 2023-04-30 DIAGNOSIS — I2510 Atherosclerotic heart disease of native coronary artery without angina pectoris: Secondary | ICD-10-CM | POA: Insufficient documentation

## 2023-04-30 DIAGNOSIS — R770 Abnormality of albumin: Secondary | ICD-10-CM | POA: Insufficient documentation

## 2023-04-30 DIAGNOSIS — E785 Hyperlipidemia, unspecified: Secondary | ICD-10-CM | POA: Diagnosis present

## 2023-04-30 DIAGNOSIS — B952 Enterococcus as the cause of diseases classified elsewhere: Secondary | ICD-10-CM | POA: Insufficient documentation

## 2023-04-30 DIAGNOSIS — I5032 Chronic diastolic (congestive) heart failure: Secondary | ICD-10-CM | POA: Insufficient documentation

## 2023-04-30 DIAGNOSIS — E872 Acidosis, unspecified: Secondary | ICD-10-CM | POA: Insufficient documentation

## 2023-04-30 DIAGNOSIS — R9431 Abnormal electrocardiogram [ECG] [EKG]: Secondary | ICD-10-CM | POA: Diagnosis present

## 2023-04-30 DIAGNOSIS — F17210 Nicotine dependence, cigarettes, uncomplicated: Secondary | ICD-10-CM | POA: Insufficient documentation

## 2023-04-30 DIAGNOSIS — D649 Anemia, unspecified: Secondary | ICD-10-CM | POA: Insufficient documentation

## 2023-04-30 DIAGNOSIS — R7401 Elevation of levels of liver transaminase levels: Secondary | ICD-10-CM | POA: Insufficient documentation

## 2023-04-30 LAB — COMPREHENSIVE METABOLIC PANEL
ALT: 24 U/L (ref 0–44)
AST: 54 U/L — ABNORMAL HIGH (ref 15–41)
Albumin: 1.9 g/dL — ABNORMAL LOW (ref 3.5–5.0)
Alkaline Phosphatase: 191 U/L — ABNORMAL HIGH (ref 38–126)
Anion gap: 10 (ref 5–15)
BUN: 6 mg/dL — ABNORMAL LOW (ref 8–23)
CO2: 24 mmol/L (ref 22–32)
Calcium: 7.2 mg/dL — ABNORMAL LOW (ref 8.9–10.3)
Chloride: 99 mmol/L (ref 98–111)
Creatinine, Ser: 0.51 mg/dL — ABNORMAL LOW (ref 0.61–1.24)
GFR, Estimated: >60 mL/min (ref >60)
Glucose, Bld: 103 mg/dL — ABNORMAL HIGH (ref 70–99)
Potassium: 2.6 mmol/L — CL (ref 3.5–5.1)
Sodium: 133 mmol/L — ABNORMAL LOW (ref 135–145)
Total Bilirubin: 3.2 mg/dL — ABNORMAL HIGH (ref 0.3–1.2)
Total Protein: 6.4 g/dL — ABNORMAL LOW (ref 6.5–8.1)

## 2023-04-30 LAB — CBC WITH DIFFERENTIAL/PLATELET
Abs Immature Granulocytes: 0.09 10*3/uL — ABNORMAL HIGH (ref 0.00–0.07)
Basophils Absolute: 0.1 10*3/uL (ref 0.0–0.1)
Basophils Relative: 1 %
Eosinophils Absolute: 0 10*3/uL (ref 0.0–0.5)
Eosinophils Relative: 0 %
HCT: 36.6 % — ABNORMAL LOW (ref 39.0–52.0)
Hemoglobin: 11.9 g/dL — ABNORMAL LOW (ref 13.0–17.0)
Immature Granulocytes: 1 %
Lymphocytes Relative: 14 %
Lymphs Abs: 2.1 10*3/uL (ref 0.7–4.0)
MCH: 31.1 pg (ref 26.0–34.0)
MCHC: 32.5 g/dL (ref 30.0–36.0)
MCV: 95.6 fL (ref 80.0–100.0)
Monocytes Absolute: 0.9 10*3/uL (ref 0.1–1.0)
Monocytes Relative: 6 %
Neutro Abs: 11.4 10*3/uL — ABNORMAL HIGH (ref 1.7–7.7)
Neutrophils Relative %: 78 %
Platelets: 401 10*3/uL — ABNORMAL HIGH (ref 150–400)
RBC: 3.83 MIL/uL — ABNORMAL LOW (ref 4.22–5.81)
RDW: 16.4 % — ABNORMAL HIGH (ref 11.5–15.5)
WBC: 14.5 10*3/uL — ABNORMAL HIGH (ref 4.0–10.5)
nRBC: 0 % (ref 0.0–0.2)

## 2023-04-30 LAB — I-STAT CG4 LACTIC ACID, ED
Lactic Acid, Venous: 2 mmol/L (ref 0.5–1.9)
Lactic Acid, Venous: 1.9 mmol/L (ref 0.5–1.9)

## 2023-04-30 MED ORDER — POTASSIUM CHLORIDE 10 MEQ/100ML IV SOLN
10.0000 meq | INTRAVENOUS | Status: AC
  Administered 2023-04-30 – 2023-05-01 (×2): 10 meq via INTRAVENOUS
  Filled 2023-04-30 (×2): qty 100

## 2023-04-30 MED ORDER — LORAZEPAM 2 MG/ML IJ SOLN
0.5000 mg | Freq: Once | INTRAMUSCULAR | Status: AC
  Administered 2023-05-01: 0.5 mg via INTRAVENOUS
  Filled 2023-04-30: qty 1

## 2023-04-30 MED ORDER — POTASSIUM CHLORIDE CRYS ER 20 MEQ PO TBCR
40.0000 meq | EXTENDED_RELEASE_TABLET | Freq: Once | ORAL | Status: AC
  Administered 2023-04-30: 40 meq via ORAL
  Filled 2023-04-30: qty 2

## 2023-04-30 MED ORDER — SODIUM CHLORIDE 0.9 % IV BOLUS
1000.0000 mL | Freq: Once | INTRAVENOUS | Status: AC
  Administered 2023-04-30: 1000 mL via INTRAVENOUS

## 2023-04-30 NOTE — ED Triage Notes (Signed)
Arrives from [facility] with purulent drainage and redness around bilary tube, pt had also pulled on it today. A/o x 2 (baseline for pt). En route 110 palpated, hr 100, rr 16, 99% ra. Denies pain.

## 2023-04-30 NOTE — ED Provider Notes (Signed)
[facility]ERGENCY DE[facility]MENT AT [facility] Provider Note   CSN: [id number] Arrival date & time: 04/30/23  1922     History {Add pertinent medical, surgical, social history, OB history to HPI:1} No chief complaint on file.   Ryan Rose is a 62 y.o. male.  HPI   Patient with extensive medical history including AAA status post stenting, alcohol dependency, CAD, hypertension, PVD, status post cholecystectomy tube placement, currently on comfort care presenting from nursing facility due to concerns of pulling out his G-tube.  Unable to obtain much information from patient, states that he has no complaints and like to go home.  Spoke with nursing staff states that he was sent over here because they are concerned that his tube was pulled out, states that he is at his normal baseline, alert and oriented just to himself, can become combative, mild nourished, not endorsing any fevers chills cough congestion no concerns of head trauma.  Home Medications Prior to Admission medications   Medication Sig Start Date End Date Taking? Authorizing Provider  acetaminophen (TYLENOL) 500 MG tablet Take one or two tablet by mouth every 6  hours as needed for pain 05/13/22     melatonin 3 MG TABS tablet Take 3 mg by mouth at bedtime.    [provider]  nicotine (NICODERM CQ - DOSED IN MG/24 HOURS) 14 mg/24hr patch Place 1 patch (14 mg total) onto the skin daily. 04/24/23   [name], MD  OLANZapine zydis (ZYPREXA) 5 MG disintegrating tablet Take 0.5 tablets (2.5 mg total) by mouth every 6 (six) hours as needed for up to 3 days (Agitation - restlessness- hallucinations). 04/23/23 04/26/23  [name], MD      Allergies    Sulfa antibiotics    Review of Systems   Review of Systems  Constitutional:  Negative for chills and fever.  Respiratory:  Negative for shortness of breath.   Cardiovascular:  Negative for chest pain.  Gastrointestinal:  Negative for  abdominal pain.  Neurological:  Negative for headaches.    Physical Exam Updated Vital Signs BP 122/88   Temp 98.8 F (37.1 C) (Oral)   Resp 20   SpO2 95%  Physical Exam Vitals and nursing note reviewed.  Constitutional:      General: He is not in acute distress.    Appearance: He is not ill-appearing.  HENT:     Head: Normocephalic and atraumatic.     Nose: No congestion.     Mouth/Throat:     Mouth: Mucous membranes are dry.     Pharynx: Oropharynx is clear.  Eyes:     Conjunctiva/sclera: Conjunctivae normal.  Cardiovascular:     Rate and Rhythm: Regular rhythm. Tachycardia present.     Pulses: Normal pulses.     Heart sounds: No murmur heard.    No friction rub. No gallop.  Pulmonary:     Effort: No respiratory distress.     Breath sounds: No wheezing, rhonchi or rales.  Abdominal:     General: There is distension.     Palpations: Abdomen is soft.     Tenderness: There is abdominal tenderness. There is no right CVA tenderness or left CVA tenderness.     Comments: Abdomen is slightly distended, tympanic to percussion, he has tenderness mainly around the right cholecystotomy tube, around the insertion site of the tube there is some erythema, but no obvious drainage or discharge, no peritoneal sign no guarding rebound tenderness please see picture for  full detail  Skin:    General: Skin is warm and dry.  Neurological:     Mental Status: He is alert.  Psychiatric:        Mood and Affect: Mood normal.     ED Results / Procedures / Treatments   Labs (all labs ordered are listed, but only abnormal results are displayed) Labs Reviewed  COMPREHENSIVE METABOLIC PANEL - Abnormal; Notable for the following components:      Result Value   Sodium 133 (*)    Potassium 2.6 (*)    Glucose, Bld 103 (*)    BUN 6 (*)    Creatinine, Ser 0.51 (*)    Calcium 7.2 (*)    Total Protein 6.4 (*)    Albumin 1.9 (*)    AST 54 (*)    Alkaline Phosphatase 191 (*)    Total Bilirubin  3.2 (*)    All other components within normal limits  CBC WITH DIFFERENTIAL/PLATELET - Abnormal; Notable for the following components:   WBC 14.5 (*)    RBC 3.83 (*)    Hemoglobin 11.9 (*)    HCT 36.6 (*)    RDW 16.4 (*)    Platelets 401 (*)    Neutro Abs 11.4 (*)    Abs Immature Granulocytes 0.09 (*)    All other components within normal limits  I-STAT CG4 LACTIC ACID, ED - Abnormal; Notable for the following components:   Lactic Acid, Venous 2.0 (*)    All other components within normal limits  URINALYSIS, W/ REFLEX TO CULTURE (INFECTION SUSPECTED)  MAGNESIUM  I-STAT CG4 LACTIC ACID, ED    EKG None  Radiology No results found.  Procedures Procedures  {Document cardiac monitor, telemetry assessment procedure when appropriate:1}  Medications Ordered in ED Medications  potassium chloride 10 mEq in 100 mL IVPB (10 mEq Intravenous New Bag/Given 04/30/23 2339)  potassium chloride SA (KLOR-CON M) CR tablet 40 mEq (has no administration in time range)  sodium chloride 0.9 % bolus 1,000 mL (1,000 mLs Intravenous Bolus 04/30/23 2337)    ED Course/ Medical Decision Making/ A&P   {   Click here for ABCD2, HEART and other calculatorsREFRESH Note before signing :1}                              Medical Decision Making Amount and/or Complexity of Data Reviewed Labs: ordered. Radiology: ordered.  Risk Prescription drug management.   This patient presents to the ED for concern of dislodged tube, this involves an extensive number of treatment options, and is a complaint that carries with it a high risk of complications and morbidity.  The differential diagnosis includes infection, tube misplacement, metabolic abnormality,    Additional history obtained:  Additional history obtained from nursing staff External records from outside source obtained and reviewed including recent hospitalization   Co morbidities that complicate the patient evaluation  Alcohol dependency,  dementia, AAA,  Social Determinants of Health:  Geriatric    Lab Tests:  I Ordered, and personally interpreted labs.  The pertinent results include: CBC shows leukocytosis of 14.5, normocytic anemia he will 11.9, CMP reveals sodium 133 potassium 2.6 glucose 103 BUN 6 creatinine 0.51 calcium 7.2 albumin 1.9 AST 54 alk phos 191 total T. bili 3.2 down from his current baseline, initial lactic was 2.0, second lactic was 1.9,   Imaging Studies ordered:  I ordered imaging studies including chest x-ray, CT abdomen pelvis I independently visualized  and interpreted imaging which showed *** I agree with the radiologist interpretation   Cardiac Monitoring:  The patient was maintained on a cardiac monitor.  I personally viewed and interpreted the cardiac monitored which showed an underlying rhythm of: Sinus tach without signs of ischemia   Medicines ordered and prescription drug management:  I ordered medication including fluids, potassium I have reviewed the patients home medicines and have made adjustments as needed  Critical Interventions:  ***   Reevaluation:  Presents with concerns of tube dislodgment, triage obtain basic lab workup which I personally viewed, slight increase in his white count, notable decrease in his potassium level, appears dry on my exam, he is mildly tachycardic, lactic is mildly elevated, concern for possible intra-abdominal infection, will obtain CT imaging for further evaluation, add on a magnesium, provide him with supplemental potassium.  Patient is or become agitated, pulling at his lines, will provide him with a little bit of Ativan and continue to monitor.  Consultations Obtained:  I requested consultation with the ***,  and discussed lab and imaging findings as well as pertinent plan - they recommend: ***    Test Considered:  ***    Rule out ****    Dispostion and problem list  After consideration of the diagnostic results and the  patients response to treatment, I feel that the patent would benefit from ***.        {Document critical care time when appropriate:1} {Document review of labs and clinical decision tools ie heart score, Chads2Vasc2 etc:1}  {Document your independent review of radiology images, and any outside records:1} {Document your discussion with family members, caretakers, and with consultants:1} {Document social determinants of health affecting pt's care:1} {Document your decision making why or why not admission, treatments were needed:1} Final Clinical Impression(s) / ED Diagnoses Final diagnoses:  None    Rx / DC Orders ED Discharge Orders     None

## 2023-04-30 NOTE — ED Triage Notes (Signed)
Pt is only oriented to self, unsure of why he is here. He has a biliary drain, minimal yellow, clear drainage noted in bag. Small amount of redness noted around insertion site.

## 2023-05-01 ENCOUNTER — Emergency Department (HOSPITAL_COMMUNITY): Payer: Self-pay

## 2023-05-01 ENCOUNTER — Other Ambulatory Visit (HOSPITAL_COMMUNITY): Payer: Self-pay

## 2023-05-01 DIAGNOSIS — K529 Noninfective gastroenteritis and colitis, unspecified: Secondary | ICD-10-CM | POA: Diagnosis present

## 2023-05-01 DIAGNOSIS — R109 Unspecified abdominal pain: Secondary | ICD-10-CM | POA: Diagnosis present

## 2023-05-01 DIAGNOSIS — R9431 Abnormal electrocardiogram [ECG] [EKG]: Secondary | ICD-10-CM | POA: Diagnosis present

## 2023-05-01 LAB — URINALYSIS, W/ REFLEX TO CULTURE (INFECTION SUSPECTED)
Glucose, UA: NEGATIVE mg/dL
Hgb urine dipstick: NEGATIVE
Ketones, ur: 5 mg/dL — AB
Leukocytes,Ua: NEGATIVE
Nitrite: NEGATIVE
Protein, ur: 30 mg/dL — AB
Specific Gravity, Urine: 1.01 (ref 1.005–1.030)
pH: 5 (ref 5.0–8.0)

## 2023-05-01 LAB — MAGNESIUM: Magnesium: 1.5 mg/dL — ABNORMAL LOW (ref 1.7–2.4)

## 2023-05-01 LAB — I-STAT CG4 LACTIC ACID, ED
Lactic Acid, Venous: 1.8 mmol/L (ref 0.5–1.9)
Lactic Acid, Venous: 1.8 mmol/L (ref 0.5–1.9)

## 2023-05-01 LAB — LIPASE, BLOOD: Lipase: 26 U/L (ref 11–51)

## 2023-05-01 LAB — POTASSIUM: Potassium: 3.4 mmol/L — ABNORMAL LOW (ref 3.5–5.1)

## 2023-05-01 LAB — PROTIME-INR
INR: 1.5 — ABNORMAL HIGH (ref 0.8–1.2)
Prothrombin Time: 18.1 seconds — ABNORMAL HIGH (ref 11.4–15.2)

## 2023-05-01 LAB — APTT: aPTT: 36 seconds (ref 24–36)

## 2023-05-01 MED ORDER — MELATONIN 3 MG PO TABS: 3.0000 mg | ORAL_TABLET | Freq: Every evening | ORAL | Status: DC | PRN

## 2023-05-01 MED ORDER — ACETAMINOPHEN 325 MG PO TABS: 650.0000 mg | ORAL_TABLET | Freq: Four times a day (QID) | ORAL | Status: DC | PRN

## 2023-05-01 MED ORDER — PIPERACILLIN-TAZOBACTAM 3.375 G IVPB
3.3750 g | Freq: Three times a day (TID) | INTRAVENOUS | Status: DC
  Administered 2023-05-01 – 2023-05-02 (×3): 3.375 g via INTRAVENOUS
  Filled 2023-05-01 (×3): qty 50

## 2023-05-01 MED ORDER — HYDROMORPHONE HCL 1 MG/ML IJ SOLN
0.5000 mg | INTRAMUSCULAR | Status: DC | PRN
  Administered 2023-05-01 – 2023-05-02 (×2): 0.5 mg via INTRAVENOUS
  Filled 2023-05-01: qty 0.5
  Filled 2023-05-01: qty 1

## 2023-05-01 MED ORDER — LACTATED RINGERS IV BOLUS
1000.0000 mL | Freq: Once | INTRAVENOUS | Status: AC
  Administered 2023-05-01: 1000 mL via INTRAVENOUS

## 2023-05-01 MED ORDER — NALOXONE HCL 0.4 MG/ML IJ SOLN: 0.4000 mg | INTRAMUSCULAR | Status: DC | PRN

## 2023-05-01 MED ORDER — PROCHLORPERAZINE EDISYLATE 10 MG/2ML IJ SOLN: 5.0000 mg | INTRAMUSCULAR | Status: DC | PRN

## 2023-05-01 MED ORDER — ONDANSETRON HCL 4 MG/2ML IJ SOLN: 4.0000 mg | Freq: Four times a day (QID) | INTRAMUSCULAR | Status: DC | PRN

## 2023-05-01 MED ORDER — ACETAMINOPHEN 650 MG RE SUPP: 650.0000 mg | Freq: Four times a day (QID) | RECTAL | Status: DC | PRN

## 2023-05-01 MED ORDER — PIPERACILLIN-TAZOBACTAM 3.375 G IVPB 30 MIN
3.3750 g | Freq: Once | INTRAVENOUS | Status: AC
  Administered 2023-05-01: 3.375 g via INTRAVENOUS
  Filled 2023-05-01: qty 50

## 2023-05-01 MED ORDER — IOHEXOL 300 MG/ML  SOLN
100.0000 mL | Freq: Once | INTRAMUSCULAR | Status: AC | PRN
  Administered 2023-05-01: 100 mL via INTRAVENOUS

## 2023-05-01 MED ORDER — POTASSIUM CHLORIDE IN NACL 40-0.9 MEQ/L-% IV SOLN
INTRAVENOUS | Status: AC
  Filled 2023-05-01: qty 1000

## 2023-05-01 MED ORDER — MAGNESIUM SULFATE 2 GM/50ML IV SOLN
2.0000 g | Freq: Once | INTRAVENOUS | Status: AC
  Administered 2023-05-01: 2 g via INTRAVENOUS
  Filled 2023-05-01: qty 50

## 2023-05-01 NOTE — ED Notes (Signed)
ED TO INPATIENT HANDOFF REPORT  ED Nurse Name and Phone #: Crist Infante, RN [phone]  S Name/Age/Gender [name] 62 y.o. male Room/Bed: WA24/WA24  Code Status   Code Status: Do not attempt resuscitation (DNR) - Comfort care  Home/SNF/Other Skilled nursing facility Patient oriented to: self Is this baseline? Yes   Triage Complete: Triage complete  Chief Complaint Abdominal pain [R10.9]  Triage Note Arrives from [facility] with purulent drainage and redness around bilary tube, pt had also pulled on it today. A/o x 2 (baseline for pt). En route 110 palpated, hr 100, rr 16, 99% ra. Denies pain.   Pt is only oriented to self, unsure of why he is here. He has a biliary drain, minimal yellow, clear drainage noted in bag. Small amount of redness noted around insertion site.    Allergies Allergies  Allergen Reactions   Sulfa Antibiotics Hives    Level of Care/Admitting Diagnosis ED Disposition     ED Disposition  Admit   Condition  --   Comment  Hospital Area: [facility] [[id number]]  Level of Care: Med-Surg [16]  May place patient in observation at [facility] or [facility] if equivalent level of care is available:: No  Covid Evaluation: Asymptomatic - no recent exposure (last 10 days) testing not required  Diagnosis: Abdominal pain [841324]  Admitting Physician: Angie Fava [[id number]]  Attending Physician: Angie Fava [[id number]]          B Medical/Surgery History Past Medical History:  Diagnosis Date   Anemia    Aortic atherosclerosis (HCC)    Arthritis    Bilateral carpal tunnel syndrome    Coronary artery disease    Diastolic dysfunction    a.) TTE 08/24/2022: EF 60-65%, mild-mod MR, G1DD.   ETOH abuse    Hepatic steatosis    History of methicillin resistant staphylococcus aureus (MRSA)    HLD (hyperlipidemia)    HTN (hypertension)    Infrarenal abdominal aortic aneurysm (AAA) without rupture (HCC) 05/28/2022   a.) CT AP  05/28/2022: saccular thrombosed; measures 4.4 x 3.2 cm   Nausea & vomiting 03/21/2023   PVD (peripheral vascular disease) with claudication (HCC)    Sigmoid diverticulosis    Transaminitis    Past Surgical History:  Procedure Laterality Date   CERVICAL LAMINOPLASTY  08/20/2021   right C3-C6   COLONOSCOPY     ENDOVASCULAR REPAIR/STENT GRAFT N/A 09/09/2022   Procedure: ENDOVASCULAR REPAIR/STENT GRAFT;  Surgeon: Renford Dills, MD;  Location: [facility] INVASIVE CV LAB;  Service: Cardiovascular;  Laterality: N/A;   ESOPHAGOGASTRODUODENOSCOPY N/A 03/25/2023   Procedure: ESOPHAGOGASTRODUODENOSCOPY (EGD);  Surgeon: Wyline Mood, MD;  Location: [facility] ENDOSCOPY;  Service: Gastroenterology;  Laterality: N/A;   HOT HEMOSTASIS  03/25/2023   Procedure: HOT HEMOSTASIS (ARGON PLASMA COAGULATION/BICAP);  Surgeon: Wyline Mood, MD;  Location: [facility] ENDOSCOPY;  Service: Gastroenterology;;   IR PERC CHOLECYSTOSTOMY  04/02/2023   SKIN GRAFT     to hand and forearm     A IV Location/Drains/Wounds Patient Lines/Drains/Airways Status     Active Line/Drains/Airways     Name Placement date Placement time Site Days   Peripheral IV 05/01/23 20 G 1" Anterior;Proximal;Right Forearm 05/01/23  0207  Forearm  less than 1   Peripheral IV 05/01/23 20 G 1" Anterior;Distal;Left;Upper Arm 05/01/23  0452  Arm  less than 1   Biliary Tube Cook slip-coat 10.2 Fr. RUQ 04/02/23  1231  RUQ  29   Pressure Injury 04/12/23 Coccyx Medial Stage 2 -  Partial thickness loss of dermis presenting as a shallow open injury with a red, pink wound bed without slough. open area on sacrum 04/12/23  0900  -- 19   Wound / Incision (Open or Dehisced) 03/21/23 Other (Comment) Elbow Left;Posterior area with scabbed abrasion 03/21/23  1742  Elbow  41   Wound / Incision (Open or Dehisced) 03/21/23 Other (Comment) Elbow Posterior;Right area with scabbed abrasion 03/21/23  1744  Elbow  41            Intake/Output Last 24 hours  Intake/Output Summary  (Last 24 hours) at 05/01/2023 1118 Last data filed at 05/01/2023 0810 Gross per 24 hour  Intake 152.66 ml  Output --  Net 152.66 ml    Labs/Imaging Results for orders placed or performed during the hospital encounter of 04/30/23 (from the past 48 hour(s))  Comprehensive metabolic panel     Status: Abnormal   Collection Time: 04/30/23  8:21 PM  Result Value Ref Range   Sodium 133 (L) 135 - 145 mmol/L   Potassium 2.6 (LL) 3.5 - 5.1 mmol/L    Comment: CRITICAL RESULT CALLED TO, READ BACK BY AND VERIFIED WITH Clois Dupes RN @ 2107 04/30/23. GILBERTL    Chloride 99 98 - 111 mmol/L   CO2 24 22 - 32 mmol/L   Glucose, Bld 103 (H) 70 - 99 mg/dL    Comment: Glucose reference range applies only to samples taken after fasting for at least 8 hours.   BUN 6 (L) 8 - 23 mg/dL   Creatinine, Ser 8.11 (L) 0.61 - 1.24 mg/dL   Calcium 7.2 (L) 8.9 - 10.3 mg/dL   Total Protein 6.4 (L) 6.5 - 8.1 g/dL   Albumin 1.9 (L) 3.5 - 5.0 g/dL   AST 54 (H) 15 - 41 U/L   ALT 24 0 - 44 U/L   Alkaline Phosphatase 191 (H) 38 - 126 U/L   Total Bilirubin 3.2 (H) 0.3 - 1.2 mg/dL   GFR, Estimated >91 >47 mL/min    Comment: (NOTE) Calculated using the CKD-EPI Creatinine Equation (2021)    Anion gap 10 5 - 15    Comment: Performed at [facility], [address], [place], [place] [address]  CBC with Differential     Status: Abnormal   Collection Time: 04/30/23  8:21 PM  Result Value Ref Range   WBC 14.5 (H) 4.0 - 10.5 K/uL   RBC 3.83 (L) 4.22 - 5.81 MIL/uL   Hemoglobin 11.9 (L) 13.0 - 17.0 g/dL   HCT 21.3 (L) 08.6 - 57.8 %   MCV 95.6 80.0 - 100.0 fL   MCH 31.1 26.0 - 34.0 pg   MCHC 32.5 30.0 - 36.0 g/dL   RDW 46.9 (H) 62.9 - 52.8 %   Platelets 401 (H) 150 - 400 K/uL   nRBC 0.0 0.0 - 0.2 %   Neutrophils Relative % 78 %   Neutro Abs 11.4 (H) 1.7 - 7.7 K/uL   Lymphocytes Relative 14 %   Lymphs Abs 2.1 0.7 - 4.0 K/uL   Monocytes Relative 6 %   Monocytes Absolute 0.9 0.1 - 1.0 K/uL   Eosinophils  Relative 0 %   Eosinophils Absolute 0.0 0.0 - 0.5 K/uL   Basophils Relative 1 %   Basophils Absolute 0.1 0.0 - 0.1 K/uL   Immature Granulocytes 1 %   Abs Immature Granulocytes 0.09 (H) 0.00 - 0.07 K/uL    Comment: Performed at [facility], [address] [place],  [place] [address]  I-Stat Lactic Acid, ED     Status: Abnormal   Collection Time: 04/30/23  8:27 PM  Result Value Ref Range   Lactic Acid, Venous 2.0 (HH) 0.5 - 1.9 mmol/L  I-Stat Lactic Acid, ED     Status: None   Collection Time: 04/30/23 10:29 PM  Result Value Ref Range   Lactic Acid, Venous 1.9 0.5 - 1.9 mmol/L  Magnesium     Status: Abnormal   Collection Time: 04/30/23 11:44 PM  Result Value Ref Range   Magnesium 1.5 (L) 1.7 - 2.4 mg/dL    Comment: Performed at [facility], [address], [place], [place] [address]  I-Stat CG4 Lactic Acid     Status: None   Collection Time: 05/01/23  2:25 AM  Result Value Ref Range   Lactic Acid, Venous 1.8 0.5 - 1.9 mmol/L  Urinalysis, w/ Reflex to Culture (Infection Suspected) -Urine, Clean Catch     Status: Abnormal   Collection Time: 05/01/23  4:29 AM  Result Value Ref Range   Specimen Source URINE, CLEAN CATCH    Color, Urine AMBER (A) YELLOW    Comment: BIOCHEMICALS MAY BE AFFECTED BY COLOR   APPearance CLEAR CLEAR   Specific Gravity, Urine 1.010 1.005 - 1.030   pH 5.0 5.0 - 8.0   Glucose, UA NEGATIVE NEGATIVE mg/dL   Hgb urine dipstick NEGATIVE NEGATIVE   Bilirubin Urine SMALL (A) NEGATIVE   Ketones, ur 5 (A) NEGATIVE mg/dL   Protein, ur 30 (A) NEGATIVE mg/dL   Nitrite NEGATIVE NEGATIVE   Leukocytes,Ua NEGATIVE NEGATIVE   RBC / HPF 11-20 0 - 5 RBC/hpf   WBC, UA 11-20 0 - 5 WBC/hpf    Comment:        Reflex urine culture not performed if WBC <=10, OR if Squamous epithelial cells >5. If Squamous epithelial cells >5 suggest recollection.    Bacteria, UA RARE (A) NONE SEEN   Squamous Epithelial / HPF 0-5 0 - 5 /HPF   Mucus  PRESENT    Budding Yeast PRESENT     Comment: Performed at [facility], [address], [place], [place] [address]  Potassium     Status: Abnormal   Collection Time: 05/01/23  4:43 AM  Result Value Ref Range   Potassium 3.4 (L) 3.5 - 5.1 mmol/L    Comment: Performed at [facility], [address], [place], [place] [address]  Protime-INR     Status: Abnormal   Collection Time: 05/01/23  4:43 AM  Result Value Ref Range   Prothrombin Time 18.1 (H) 11.4 - 15.2 seconds   INR 1.5 (H) 0.8 - 1.2    Comment: (NOTE) INR goal varies based on device and disease states. Performed at [facility], [address] [place], [place] [address]   APTT     Status: None   Collection Time: 05/01/23  4:43 AM  Result Value Ref Range   aPTT 36 24 - 36 seconds    Comment: Performed at [facility], [address] [place], [place] [address]  Lipase, blood     Status: None   Collection Time: 05/01/23  4:43 AM  Result Value Ref Range   Lipase 26 11 - 51 U/L    Comment: Performed at [facility], [address], [place], [place] [address]  I-Stat CG4 Lactic Acid     Status: None   Collection Time: 05/01/23  5:14 AM  Result Value Ref Range   Lactic Acid, Venous 1.8 0.5 - 1.9 mmol/L   CT ABDOMEN PELVIS W CONTRAST  Result Date: 05/01/2023 CLINICAL DATA:  Postoperative abdominal pain EXAM: CT ABDOMEN AND PELVIS WITH CONTRAST TECHNIQUE: Multidetector CT imaging of the abdomen and pelvis was performed using the standard protocol following bolus administration of intravenous contrast. RADIATION DOSE REDUCTION: This exam was performed according to the departmental dose-optimization program which includes automated exposure control, adjustment of the mA and/or kV according to patient size and/or use of iterative reconstruction technique. CONTRAST:  OMNIPAQUE IOHEXOL 300 MG/ML  SOLN COMPARISON:  04/28/2023 FINDINGS:  Lower chest: Stable small right pleural effusion. Mild right basilar atelectasis. Extensive multi-vessel coronary artery calcification. Hepatobiliary: Heterogeneous attenuation of the hepatic parenchyma likely relates to geographic fatty hepatic infiltration. No focal enhancing intrahepatic mass identified. Percutaneous cholecystostomy is in expected position with the retaining loop seen within the gallbladder lumen. The gallbladder remains mildly distended, however, which may relate to either occlusion of the tube or viscus consistency of the gallbladder contents. No intra or extrahepatic biliary ductal dilation. Pancreas: Unremarkable Spleen: Unremarkable Adrenals/Urinary Tract: The adrenal glands are unremarkable. The kidneys are normal. The bladder is unremarkable. Stomach/Bowel: 5 cm gas and stool containing collection adjacent to the sigmoid colon is unchanged, compatible with a giant sigmoid diverticulum. There is background severe sigmoid diverticulosis. There is circumferential wall thickening involving the cecum and ascending colon as well as the sigmoid colon which may reflect changes of a superimposed infectious or inflammatory colitis. No evidence of obstruction. Mild ascites appears stable. No free intraperitoneal gas. Appendix normal. Vascular/Lymphatic: Status post endovascular abdominal aortic aneurysm repair utilizing a bifurcated stent graft. Aneurysm sac is stable measuring 5.3 x 3.6 cm. No pathologic adenopathy within the abdomen and pelvis. Reproductive: Prostate is unremarkable. Other: No abdominal wall hernia Musculoskeletal: No acute bone abnormality osseous structures are age-appropriate. IMPRESSION: 1. Percutaneous cholecystostomy in expected position. The gallbladder remains mildly distended, which may relate to either occlusion of the tube or viscus consistency of the gallbladder contents. Clinical correlation advised. 2. Stable small right pleural effusion. 3. Extensive multi-vessel  coronary artery calcification. 4. Geographic fatty hepatic infiltration, better appreciated on MRI of 03/30/2023. 5. Stable mild ascites. 6. Circumferential wall thickening involving the cecum and ascending colon as well as the sigmoid colon which may reflect changes of a superimposed infectious or inflammatory colitis. 7. Status post EVAR.  Aneurysm sac is stable measuring 5.3 x 3.6 cm. Electronically Signed   By: Helyn Numbers M.D.   On: 05/01/2023 01:02   DG Chest Portable 1 View  Result Date: 05/01/2023 CLINICAL DATA:  Sepsis EXAM: PORTABLE CHEST 1 VIEW COMPARISON:  04/02/2023 FINDINGS: Elevation of right diaphragm with atelectasis at the right base and small pleural fluid. Low lung volumes. Normal cardiac size. No pneumothorax IMPRESSION: Low lung volumes with elevation of right diaphragm and small right pleural effusion and basilar atelectasis. Electronically Signed   By: Jasmine Pang M.D.   On: 05/01/2023 00:46    Pending Labs Unresulted Labs (From admission, onward)     Start     Ordered   05/01/23 0429  Urine Culture  Once,   R        05/01/23 0429   05/01/23 0345  Blood Culture (routine x 2)  (Undifferentiated presentation (screening labs and basic nursing orders))  BLOOD CULTURE X 2,   STAT      05/01/23 0345  Vitals/Pain Today's Vitals   05/01/23 0729 05/01/23 0756 05/01/23 0945 05/01/23 1015  BP:   95/64 96/70  Pulse:   98 98  Resp:   12 13  Temp:      TempSrc:      SpO2:   95% 97%  Weight:      Height:      PainSc: 10-Worst pain ever Asleep      Isolation Precautions No active isolations  Medications Medications  piperacillin-tazobactam (ZOSYN) IVPB 3.375 g (has no administration in time range)  acetaminophen (TYLENOL) tablet 650 mg (has no administration in time range)    Or  acetaminophen (TYLENOL) suppository 650 mg (has no administration in time range)  melatonin tablet 3 mg (has no administration in time range)  naloxone [facility]) injection  0.4 mg (has no administration in time range)  HYDROmorphone (DILAUDID) injection 0.5 mg (0.5 mg Intravenous Given 05/01/23 0735)  prochlorperazine (COMPAZINE) injection 5 mg (has no administration in time range)  potassium chloride 10 mEq in 100 mL IVPB (0 mEq Intravenous Stopped 05/01/23 0459)  potassium chloride SA (KLOR-CON M) CR tablet 40 mEq (40 mEq Oral Given 04/30/23 2359)  sodium chloride 0.9 % bolus 1,000 mL (1,000 mLs Intravenous Bolus 04/30/23 2337)  LORazepam (ATIVAN) injection 0.5 mg (0.5 mg Intravenous Given 05/01/23 0002)  iohexol (OMNIPAQUE) 300 MG/ML solution 100 mL (100 mLs Intravenous Contrast Given 05/01/23 0008)  magnesium sulfate IVPB 2 g 50 mL (0 g Intravenous Stopped 05/01/23 0355)  lactated ringers bolus 1,000 mL (1,000 mLs Intravenous Bolus 05/01/23 0452)  piperacillin-tazobactam (ZOSYN) IVPB 3.375 g (0 g Intravenous Stopped 05/01/23 0810)    Mobility non-ambulatory     Focused Assessments Neuro Assessment Handoff:  Swallow screen pass?  N/A         Neuro Assessment: Exceptions to WDL Neuro Checks:      Has TPA been given? No If patient is a Neuro Trauma and patient is going to OR before floor call report to 4N Charge nurse: [phone] or [phone]   R Recommendations: See Admitting Provider Note  Report given to:   Additional Notes:
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
## 2023-05-01 NOTE — Progress Notes (Signed)
  Carryover admission to the Day Admitter.  I discussed this case with the EDP, Berle Mull.  Per these discussions:   This is a 62 year old male on comfort care is being admitted with abdominal pain.   Per EDP's discussions with the patient's wife, wife confirms that the patient is on comfort care, although he does still have a cholecystostomy tube in place from previous hospitalization.  CT abdomen/pelvis this evening showed that the patient's cholecystostomy tube is in place, with that imaging reflects potential new colitis.   Patient's wife prefers the patient be admitted to ensure adequate pain control as opposed to being discharged back to the patient's facility overnight.  She confirms that the patient is DNR/DNI  I have placed an order for observation to MedSurg for pain control relating to presenting abdominal discomfort.    I have placed some additional preliminary admit orders via the adult multi-morbid admission order set. I have also ordered prn IV Dilaudid, prn IV Zofran    Newton Pigg, DO Hospitalist

## 2023-05-01 NOTE — Progress Notes (Addendum)
Received patient from [facility] ED. Awake, alert disoriented x4.  Received with cholecystostomy tube , dressing with purulent drainage. Dressing changed. Noted with bed soiled with urine. Bath and skin care rendered.   05/01/23 1230  Vitals  Temp 97.8 F (36.6 C)  Temp Source Oral  BP 102/73  MAP (mmHg) 82  BP Location Left Arm  BP Method Automatic  Patient Position (if appropriate) Lying  Pulse Rate (!) 103  Pulse Rate Source Monitor  Resp 12  Level of Consciousness  Level of Consciousness Alert  MEWS COLOR  MEWS Score Color Yellow  Oxygen Therapy  SpO2 95 %  O2 Device Room Air  Pain Assessment  Pain Scale PAINAD  Pain Score 0  MEWS Score  MEWS Temp 0  MEWS Systolic 0  MEWS Pulse 1  MEWS RR 1  MEWS LOC 0  MEWS Score 2

## 2023-05-01 NOTE — Progress Notes (Signed)
Pharmacy Antibiotic Note  Ryan Rose is a 62 y.o. male admitted on 04/30/2023 with extensive medical history including AAA status post stenting, alcohol dependency, CAD, hypertension, PVD, status post cholecystectomy tube placement, currently on comfort care presenting from nursing facility due to concerns of pulling out his G-tube. .  Pharmacy has been consulted for zosyn dosing.  Plan: Zosyn 3.375g IV q8h (4 hour infusion). Pharmacy will sign off and follow peripherally     Temp (24hrs), Avg:98.3 F (36.8 C), Min:98 F (36.7 C), Max:98.8 F (37.1 C)  Recent Labs  Lab 04/30/23 2021 04/30/23 2027 04/30/23 2229 05/01/23 0225  WBC 14.5*  --   --   --   CREATININE 0.51*  --   --   --   LATICACIDVEN  --  2.0* 1.9 1.8    Estimated Creatinine Clearance: 89.4 mL/min (A) (by C-G formula based on SCr of 0.51 mg/dL (L)).    Allergies  Allergen Reactions   Sulfa Antibiotics Hives     Thank you for allowing pharmacy to be a part of this patient's care.  Arley Phenix RPh 05/01/2023, 3:57 AM

## 2023-05-01 NOTE — ED Notes (Signed)
Pt getting very agitated and verbally abusive. He keeps trying to take off his equipment and getting out of the bed. Provider made aware.

## 2023-05-01 NOTE — Plan of Care (Signed)
  Problem: Education: Goal: Knowledge of discharge needs will improve Outcome: Progressing   Problem: Clinical Measurements: Goal: Postoperative complications will be avoided or minimized Outcome: Progressing   Problem: Respiratory: Goal: Ability to achieve and maintain a regular respiratory rate will improve Outcome: Progressing   Problem: Skin Integrity: Goal: Demonstration of wound healing without infection will improve Outcome: Progressing   Problem: Fluid Volume: Goal: Hemodynamic stability will improve Outcome: Progressing   Problem: Clinical Measurements: Goal: Diagnostic test results will improve Outcome: Progressing Goal: Signs and symptoms of infection will decrease Outcome: Progressing   Problem: Respiratory: Goal: Ability to maintain adequate ventilation will improve Outcome: Progressing   Problem: Education: Goal: Knowledge of General Education information will improve Description: Including pain rating scale, medication(s)/side effects and non-pharmacologic comfort measures Outcome: Progressing   Problem: Health Behavior/Discharge Planning: Goal: Ability to manage health-related needs will improve Outcome: Progressing   Problem: Clinical Measurements: Goal: Ability to maintain clinical measurements within normal limits will improve Outcome: Progressing Goal: Will remain free from infection Outcome: Progressing Goal: Diagnostic test results will improve Outcome: Progressing Goal: Respiratory complications will improve Outcome: Progressing Goal: Cardiovascular complication will be avoided Outcome: Progressing   Problem: Activity: Goal: Risk for activity intolerance will decrease Outcome: Progressing   Problem: Nutrition: Goal: Adequate nutrition will be maintained Outcome: Progressing   Problem: Coping: Goal: Level of anxiety will decrease Outcome: Progressing   Problem: Elimination: Goal: Will not experience complications related to bowel  motility Outcome: Progressing Goal: Will not experience complications related to urinary retention Outcome: Progressing   Problem: Pain Managment: Goal: General experience of comfort will improve Outcome: Progressing   Problem: Safety: Goal: Ability to remain free from injury will improve Outcome: Progressing   Problem: Skin Integrity: Goal: Risk for impaired skin integrity will decrease Outcome: Progressing

## 2023-05-02 DIAGNOSIS — E871 Hypo-osmolality and hyponatremia: Secondary | ICD-10-CM

## 2023-05-02 DIAGNOSIS — E782 Mixed hyperlipidemia: Secondary | ICD-10-CM

## 2023-05-02 DIAGNOSIS — E876 Hypokalemia: Secondary | ICD-10-CM

## 2023-05-02 DIAGNOSIS — R9431 Abnormal electrocardiogram [ECG] [EKG]: Secondary | ICD-10-CM

## 2023-05-02 DIAGNOSIS — K529 Noninfective gastroenteritis and colitis, unspecified: Secondary | ICD-10-CM

## 2023-05-02 DIAGNOSIS — R1011 Right upper quadrant pain: Secondary | ICD-10-CM

## 2023-05-02 LAB — COMPREHENSIVE METABOLIC PANEL
ALT: 22 U/L (ref 0–44)
AST: 51 U/L — ABNORMAL HIGH (ref 15–41)
Albumin: 1.8 g/dL — ABNORMAL LOW (ref 3.5–5.0)
Alkaline Phosphatase: 169 U/L — ABNORMAL HIGH (ref 38–126)
Anion gap: 9 (ref 5–15)
BUN: 5 mg/dL — ABNORMAL LOW (ref 8–23)
CO2: 20 mmol/L — ABNORMAL LOW (ref 22–32)
Calcium: 7.2 mg/dL — ABNORMAL LOW (ref 8.9–10.3)
Chloride: 107 mmol/L (ref 98–111)
Creatinine, Ser: 0.47 mg/dL — ABNORMAL LOW (ref 0.61–1.24)
GFR, Estimated: >60 mL/min (ref >60)
Glucose, Bld: 99 mg/dL (ref 70–99)
Potassium: 3.5 mmol/L (ref 3.5–5.1)
Sodium: 136 mmol/L (ref 135–145)
Total Bilirubin: 3.1 mg/dL — ABNORMAL HIGH (ref 0.3–1.2)
Total Protein: 6.1 g/dL — ABNORMAL LOW (ref 6.5–8.1)

## 2023-05-02 LAB — MAGNESIUM: Magnesium: 1.6 mg/dL — ABNORMAL LOW (ref 1.7–2.4)

## 2023-05-02 LAB — CBC
HCT: 35.4 % — ABNORMAL LOW (ref 39.0–52.0)
Hemoglobin: 11.4 g/dL — ABNORMAL LOW (ref 13.0–17.0)
MCH: 31.6 pg (ref 26.0–34.0)
MCHC: 32.2 g/dL (ref 30.0–36.0)
MCV: 98.1 fL (ref 80.0–100.0)
Platelets: 314 10*3/uL (ref 150–400)
RBC: 3.61 MIL/uL — ABNORMAL LOW (ref 4.22–5.81)
RDW: 16.8 % — ABNORMAL HIGH (ref 11.5–15.5)
WBC: 12.4 10*3/uL — ABNORMAL HIGH (ref 4.0–10.5)
nRBC: 0 % (ref 0.0–0.2)

## 2023-05-02 LAB — PHOSPHORUS: Phosphorus: 3.1 mg/dL (ref 2.5–4.6)

## 2023-05-02 MED ORDER — DOXYCYCLINE HYCLATE 100 MG PO TABS
100.0000 mg | ORAL_TABLET | Freq: Two times a day (BID) | ORAL | Status: DC
  Administered 2023-05-02 (×2): 100 mg via ORAL
  Filled 2023-05-02 (×3): qty 1

## 2023-05-02 MED ORDER — MAGNESIUM SULFATE 2 GM/50ML IV SOLN
2.0000 g | Freq: Once | INTRAVENOUS | Status: AC
  Administered 2023-05-02: 2 g via INTRAVENOUS
  Filled 2023-05-02: qty 50

## 2023-05-02 MED ORDER — OXYCODONE HCL 5 MG PO CAPS: 5.0000 mg | ORAL_CAPSULE | ORAL | 0 refills | Status: DC | PRN

## 2023-05-02 MED ORDER — AMOXICILLIN-POT CLAVULANATE 875-125 MG PO TABS: 1.0000 | ORAL_TABLET | Freq: Two times a day (BID) | ORAL | Status: AC

## 2023-05-02 MED ORDER — DOXYCYCLINE HYCLATE 100 MG PO TABS: 100.0000 mg | ORAL_TABLET | Freq: Two times a day (BID) | ORAL | Status: AC

## 2023-05-02 MED ORDER — AMOXICILLIN-POT CLAVULANATE 875-125 MG PO TABS
1.0000 | ORAL_TABLET | Freq: Two times a day (BID) | ORAL | Status: DC
  Administered 2023-05-02 (×2): 1 via ORAL
  Filled 2023-05-02 (×2): qty 1

## 2023-05-02 MED ORDER — ACETAMINOPHEN 325 MG PO TABS: 650.0000 mg | ORAL_TABLET | Freq: Four times a day (QID) | ORAL | 0 refills | Status: DC | PRN

## 2023-05-02 MED ORDER — ATIVAN 0.5 MG PO TABS: 0.5000 mg | ORAL_TABLET | ORAL | 0 refills | Status: DC

## 2023-05-02 MED ORDER — POTASSIUM CHLORIDE CRYS ER 20 MEQ PO TBCR
40.0000 meq | EXTENDED_RELEASE_TABLET | Freq: Two times a day (BID) | ORAL | Status: AC
  Administered 2023-05-02 (×2): 40 meq via ORAL
  Filled 2023-05-02 (×2): qty 2

## 2023-05-02 NOTE — TOC Transition Note (Addendum)
Transition of Care [facility]) - CM/SW Discharge Note   Patient Details  Name: Ryan Rose MRN: [id number] Date of Birth: 1961/01/10  [facility] of [facility]) CM/SW Contact:  Adrian Prows, RN Phone Number: 05/02/2023, 12:13 PM   Clinical Narrative:    D/C orders received; Whitney at facility notified pt will d/c today; she previously gave call report # [phone], Room # 120B; pt transport by [facility] per Renea Ee, [facility] hospital liaison; pt's wife Ryan Rose notified and agrees to d/c plan; Lillia Dallas, RN will notify this RN, CM when pt is ready for transport.   -1421- d/c summary received; spoke w/ operator # [phone]; he was notified pt transport by [facility] for [facility]; no TOC needs. Final next level of care: Skilled Nursing Facility Barriers to Discharge: No Barriers Identified   Patient Goals and CMS Choice      Discharge Placement                Patient chooses bed at: Other - please specify in the comment section below: [facility]) Patient to be transferred to facility by: [facility] Name of family member notified: Ryan Rose (spouse) [phone] Patient and family notified of of transfer: 05/02/23  Discharge Plan and Services Additional resources added to the After Visit Summary for   In-house Referral: NA Discharge Planning Services: CM Consult            DME Arranged: N/A DME Agency: NA       HH Arranged: NA HH Agency: NA        Social Determinants of Health (SDOH) Interventions SDOH Screenings   Food Insecurity: No Food Insecurity (05/02/2023)  Housing: Patient Declined (05/02/2023)  Transportation Needs: No Transportation Needs (05/02/2023)  Utilities: Not At Risk (05/02/2023)  Tobacco Use: High Risk (04/28/2023)     Readmission Risk Interventions     No data to display
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
## 2023-05-02 NOTE — Progress Notes (Signed)
[facility]ED HOSPICE PATIENT  Current hospice patient followed at [facility] for terminal diagnosis of CVD.  Patient was admitted to [facility] on 9.21.24 with diagnosis of abdominal pain.   Per Dr. Nolon Rod Monguilod, hospice physician, this is a related hospital admission.  Patient presented to ED for complaint of abdominal pain and purulent drainage and erythema from his cholecystostomy tube.  Appropriate for GIP LOC for IV antibiotics for colitis and for symptom management of pain.  Vital Signs: T 98.2 (A), BP 120/75, P 106, R 17, Oxi 99% on RA  Abnormal Labs: His urinalysis shows small bilirubin, ketones of 5 and protein of 30 mg/deciliter with rare bacteria microscopic examination and presence of budding yeast. His CBC showed a white count of 14.5, hemoglobin 11.9 g/dL platelets 027. PT 18.1, INR 1.5 and PTT 36. Lactic acid 2.0 and 1.9 mmol/L. CMP showed a sodium 133, potassium 2.6, chloride 99 CO2 24 mmol/L. Glucose 103, BUN 6, creatinine 0.51 and total bilirubin 3.2 mg/dL. Calcium corrects to albumin level. Total protein was 6.4 and albumin 1.9 g/dL. AST 54, ALT 24 and alkaline phosphatase 191 units/L. Magnesium was 1.5 mg/deciliter. Repeat potassium level 3.4 mmol/L.   Diagnostics:  Portable 1 view chest radiograph with low lung volumes with right hemidiaphragm elevation and small right pleural effusion with basilar atelectasis. CT abdomen/pelvis with contrast showing percutaneous cholecystostomy in place. Mildly distention of the gallbladder remains. Small right pleural effusion. Extensive multivessel CAD. Geographic fatty hepatic infiltration better seen on MRI. Stable myositis. Circumferential wall thickening involving the cecum, ascending and sigmoid colon which could be infectious or inflammatory colitis. Status post EVAR. Aneurysm sac is stable measuring 5.3 x 3.6 cm.   IV Meds: Zoxyn 3.375g IV every 8 hours Ativan 0.5mg  IV 9.21.24 @ 000s Magnesiu  sulfate 2g IV 9.21.24 @ 0215 Potassium Chloride IV 9.20  @ 2339,  9.21 @ 0242 Dilaudid .5mg  IV 9.21 @ 0735, 9.22 @ 0858  Hospital Problems per H&P  Abdominal pain Status post cholecystostomy. Observation/MedSurg. Gentle IV hydration. Analgesics as needed. Antiemetics as needed. Continue Zosyn 3.375 g IVPB every 8 hours.    Active Problems:   Colitis  Continue treatment as above.     Hypomagnesemia Replaced. Follow-up level as needed.     Hypokalemia Replacing. Follow-up potassium level in AM.     Hyponatremia Mild, secondary to poor nutrition. Cholecystostomy drainage losses. Gentle IV hydration. Follow-up sodium as needed.     Hyperlipidemia Significant liver disease. Currently in comfort care. No need to start therapy.     Prolonged QT interval Avoid QT prolonging meds as possible. KCl supplementation. Magnesium sulfate 2 g IVPB now. Keep electrolytes optimized. Check EKG in the morning.    Advance Care Planning:   Code Status: Do not attempt resuscitation (DNR) - Comfort care  May continue IV antibiotics and IV fluids.  Discharge Planning:  Discharge back to [facility] today.  Family contact:  Spoke with wife, [employer], via phone.  Offered transfer to [facility] for symptom management of abdominal pain, but she would like transfer back to facility on pain meds and PO antibiotics, when hospital says he is ready for discharge.  IDT:  Updated    Goals of Care- clear, DNR  Medication list placed on shadow chart.   Should patient need ambulance transfer at discharge- please use [facility]) as they contract this service for our active hospice patients.  Norris Cross, RN Nurse Liaison [phone]

## 2023-05-02 NOTE — Discharge Summary (Signed)
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 35.4*  MCV 95.6 98.1  PLT 401* 314   Basic Metabolic Panel: Recent Labs  Lab 04/30/23 2021 04/30/23 2344 05/01/23 0443 05/02/23 0856  NA 133*  --   --  136  K 2.6*  --  3.4* 3.5  CL 99  --   --  107  CO2 24  --   --  20*  GLUCOSE 103*  --   --  99  BUN 6*  --   --  5*  CREATININE 0.51*  --   --  0.47*  CALCIUM 7.2*  --   --  7.2*  MG  --  1.5*  --  1.6*  PHOS  --   --   --  3.1   Liver Function Tests: Recent Labs  Lab 04/30/23 2021 05/02/23 0856  AST 54* 51*  ALT 24 22  ALKPHOS 191* 169*  BILITOT 3.2* 3.1*  PROT 6.4* 6.1*  ALBUMIN 1.9* 1.8*   CBG: No results for input(s): "GLUCAP" in the last 168 hours.  Discharge time spent: greater than 30 minutes.  Signed: Marguerita Merles, DO [facility] 05/02/2023

## 2023-05-02 NOTE — Plan of Care (Signed)
Report given to [facility]. Pending transfer to [facility]. Problem: Education: Goal: Knowledge of discharge needs will improve Outcome: Adequate for Discharge   Problem: Clinical Measurements: Goal: Postoperative complications will be avoided or minimized Outcome: Adequate for Discharge   Problem: Respiratory: Goal: Ability to achieve and maintain a regular respiratory rate will improve Outcome: Adequate for Discharge   Problem: Skin Integrity: Goal: Demonstration of wound healing without infection will improve Outcome: Adequate for Discharge   Problem: Fluid Volume: Goal: Hemodynamic stability will improve Outcome: Adequate for Discharge   Problem: Clinical Measurements: Goal: Diagnostic test results will improve Outcome: Adequate for Discharge Goal: Signs and symptoms of infection will decrease Outcome: Adequate for Discharge   Problem: Respiratory: Goal: Ability to maintain adequate ventilation will improve Outcome: Adequate for Discharge   Problem: Education: Goal: Knowledge of General Education information will improve Description: Including pain rating scale, medication(s)/side effects and non-pharmacologic comfort measures Outcome: Adequate for Discharge   Problem: Health Behavior/Discharge Planning: Goal: Ability to manage health-related needs will improve Outcome: Adequate for Discharge   Problem: Clinical Measurements: Goal: Ability to maintain clinical measurements within normal limits will improve Outcome: Adequate for Discharge Goal: Will remain free from infection Outcome: Adequate for Discharge Goal: Diagnostic test results will improve Outcome: Adequate for Discharge Goal: Respiratory complications will improve Outcome: Adequate for Discharge Goal: Cardiovascular complication will be avoided Outcome: Adequate for Discharge   Problem: Activity: Goal: Risk for activity intolerance will decrease Outcome: Adequate for Discharge    Problem: Nutrition: Goal: Adequate nutrition will be maintained Outcome: Adequate for Discharge   Problem: Coping: Goal: Level of anxiety will decrease Outcome: Adequate for Discharge   Problem: Elimination: Goal: Will not experience complications related to bowel motility Outcome: Adequate for Discharge Goal: Will not experience complications related to urinary retention Outcome: Adequate for Discharge   Problem: Pain Managment: Goal: General experience of comfort will improve Outcome: Adequate for Discharge   Problem: Safety: Goal: Ability to remain free from injury will improve Outcome: Adequate for Discharge   Problem: Skin Integrity: Goal: Risk for impaired skin integrity will decrease Outcome: Adequate for Discharge
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
## 2023-05-03 LAB — URINE CULTURE: Culture: >=100000 — AB

## 2023-05-03 NOTE — Progress Notes (Signed)
Patient left facility via transport/stretcher. Biliary drain intact. No IV's present. Belongings sent with patient includes paperwork and cell phone.

## 2023-05-03 NOTE — Progress Notes (Signed)
This RN attempted to call [facility] to give update on patient, but there was no answer.

## 2023-05-05 ENCOUNTER — Emergency Department (HOSPITAL_COMMUNITY)
Admission: EM | Admit: 2023-05-05 | Discharge: 2023-05-05 | Disposition: A | Discharge status: Home / Self Care | Payer: Self-pay | Attending: Emergency Medicine | Admitting: Emergency Medicine

## 2023-05-05 ENCOUNTER — Other Ambulatory Visit: Payer: Self-pay

## 2023-05-05 ENCOUNTER — Emergency Department (HOSPITAL_COMMUNITY): Payer: Self-pay

## 2023-05-05 ENCOUNTER — Encounter (HOSPITAL_COMMUNITY): Payer: Self-pay

## 2023-05-05 DIAGNOSIS — I10 Essential (primary) hypertension: Secondary | ICD-10-CM | POA: Insufficient documentation

## 2023-05-05 DIAGNOSIS — I2510 Atherosclerotic heart disease of native coronary artery without angina pectoris: Secondary | ICD-10-CM | POA: Insufficient documentation

## 2023-05-05 DIAGNOSIS — T85590A Other mechanical complication of bile duct prosthesis, initial encounter: Secondary | ICD-10-CM | POA: Insufficient documentation

## 2023-05-05 DIAGNOSIS — Y69 Unspecified misadventure during surgical and medical care: Secondary | ICD-10-CM | POA: Insufficient documentation

## 2023-05-05 DIAGNOSIS — T85518A Breakdown (mechanical) of other gastrointestinal prosthetic devices, implants and grafts, initial encounter: Secondary | ICD-10-CM

## 2023-05-05 DIAGNOSIS — F0390 Unspecified dementia without behavioral disturbance: Secondary | ICD-10-CM | POA: Insufficient documentation

## 2023-05-05 DIAGNOSIS — R109 Unspecified abdominal pain: Secondary | ICD-10-CM | POA: Insufficient documentation

## 2023-05-05 LAB — COMPREHENSIVE METABOLIC PANEL
ALT: 20 U/L (ref 0–44)
AST: 43 U/L — ABNORMAL HIGH (ref 15–41)
Albumin: 1.8 g/dL — ABNORMAL LOW (ref 3.5–5.0)
Alkaline Phosphatase: 163 U/L — ABNORMAL HIGH (ref 38–126)
Anion gap: 8 (ref 5–15)
BUN: <5 mg/dL — ABNORMAL LOW (ref 8–23)
CO2: 21 mmol/L — ABNORMAL LOW (ref 22–32)
Calcium: 7.8 mg/dL — ABNORMAL LOW (ref 8.9–10.3)
Chloride: 106 mmol/L (ref 98–111)
Creatinine, Ser: <0.3 mg/dL — ABNORMAL LOW (ref 0.61–1.24)
Glucose, Bld: 109 mg/dL — ABNORMAL HIGH (ref 70–99)
Potassium: 4.1 mmol/L (ref 3.5–5.1)
Sodium: 135 mmol/L (ref 135–145)
Total Bilirubin: 2.4 mg/dL — ABNORMAL HIGH (ref 0.3–1.2)
Total Protein: 6.4 g/dL — ABNORMAL LOW (ref 6.5–8.1)

## 2023-05-05 LAB — CBC WITH DIFFERENTIAL/PLATELET
Abs Immature Granulocytes: 0.03 10*3/uL (ref 0.00–0.07)
Basophils Absolute: 0 10*3/uL (ref 0.0–0.1)
Basophils Relative: 0 %
Eosinophils Absolute: 0 10*3/uL (ref 0.0–0.5)
Eosinophils Relative: 0 %
HCT: 33.7 % — ABNORMAL LOW (ref 39.0–52.0)
Hemoglobin: 11.1 g/dL — ABNORMAL LOW (ref 13.0–17.0)
Immature Granulocytes: 0 %
Lymphocytes Relative: 16 %
Lymphs Abs: 1.7 10*3/uL (ref 0.7–4.0)
MCH: 32.3 pg (ref 26.0–34.0)
MCHC: 32.9 g/dL (ref 30.0–36.0)
MCV: 98 fL (ref 80.0–100.0)
Monocytes Absolute: 0.6 10*3/uL (ref 0.1–1.0)
Monocytes Relative: 6 %
Neutro Abs: 8.2 10*3/uL — ABNORMAL HIGH (ref 1.7–7.7)
Neutrophils Relative %: 78 %
Platelets: 289 10*3/uL (ref 150–400)
RBC: 3.44 MIL/uL — ABNORMAL LOW (ref 4.22–5.81)
RDW: 16.5 % — ABNORMAL HIGH (ref 11.5–15.5)
WBC: 10.6 10*3/uL — ABNORMAL HIGH (ref 4.0–10.5)
nRBC: 0 % (ref 0.0–0.2)

## 2023-05-05 LAB — LIPASE, BLOOD: Lipase: 25 U/L (ref 11–51)

## 2023-05-05 LAB — I-STAT CG4 LACTIC ACID, ED: Lactic Acid, Venous: 1.9 mmol/L (ref 0.5–1.9)

## 2023-05-05 MED ORDER — IOHEXOL 300 MG/ML  SOLN
100.0000 mL | Freq: Once | INTRAMUSCULAR | Status: AC | PRN
  Administered 2023-05-05: 100 mL via INTRAVENOUS

## 2023-05-05 NOTE — Discharge Instructions (Addendum)
It was a pleasure taking care of you today.  As discussed, your CT results were stable from previous CT scan.  Your labs have been improving over the past 3 days.  Continue taking your antibiotics as previously prescribed.  Please follow-up with PCP in 2 to 3 days for recheck.  Your PC tube was in the correct position.  Return to the ER for new or worsening symptoms.

## 2023-05-05 NOTE — ED Notes (Signed)
Attempted to call facility to give report. When someone answered I was transferred and was disconnected. Attempted to call the facility again, but was unable to get anyone to answer the phone. Spoke with patient's wife and she was unable to advise on any other number I could call.

## 2023-05-05 NOTE — ED Provider Notes (Signed)
EMERGENCY DEPARTMENT AT [facility] Provider Note   CSN: [id number] Arrival date & time: 05/05/23  1332     History  Chief Complaint  Patient presents with   tube problem    Ryan Rose is a 62 y.o. male with a past medical history significant for anemia, aortic atherosclerosis, CAD, diastolic dysfunction, history of ethanol abuse, attic steatosis, hyperlipidemia, hypertension, infrarenal AAA without rupture, and history of dementia who presents to the ED due to possible cholecystostomy tube displacement.  Per EMS, living facility sent patient to the ED due to possible PC tube displacement.  Also admits to some erythema surrounding insertion site.  Patient denies any complaints.  He notes he is hungry and wants to eat.  Chart reviewed.  Patient recently admitted on 9/20 to 9/22 due to abdominal pain status post cholecystostomy tube.  Patient also had numerous electrolyte abnormalities.  History of dementia.  Level 5 caveat.  History obtained from patient, EMS, and past medical records. No interpreter used during encounter.       Home Medications Prior to Admission medications   Medication Sig Start Date End Date Taking? Authorizing Provider  acetaminophen (TYLENOL) 325 MG tablet Take 2 tablets (650 mg total) by mouth every 6 (six) hours as needed for mild pain (or Fever >/= 101). 05/02/23   Sheikh, Omair Latif, DO  amoxicillin-clavulanate (AUGMENTIN) 875-125 MG tablet Take 1 tablet by mouth every 12 (twelve) hours for 14 days. 05/02/23 05/16/23  Marguerita Merles Latif, DO  ATIVAN 0.5 MG tablet Take 1 tablet (0.5 mg total) by mouth See admin instructions. Take 0.5 mg by mouth at 12 MIDNIGHT, 6 AM, 12 NOON, and 6 PM 05/02/23   Sheikh, Plainedge Latif, DO  doxycycline (VIBRA-TABS) 100 MG tablet Take 1 tablet (100 mg total) by mouth every 12 (twelve) hours for 14 days. 05/02/23 05/16/23  Marguerita Merles Latif, DO  melatonin 3 MG TABS tablet Take 3 mg by mouth at bedtime.     [provider]  nicotine (NICODERM CQ - DOSED IN MG/24 HOURS) 14 mg/24hr patch Place 1 patch (14 mg total) onto the skin daily. 04/24/23   [name], MD  OLANZapine zydis (ZYPREXA) 5 MG disintegrating tablet Take 0.5 tablets (2.5 mg total) by mouth every 6 (six) hours as needed for up to 3 days (Agitation - restlessness- hallucinations). Patient not taking: Reported on 05/01/2023 04/23/23 05/01/23  [name], MD  oxycodone (OXY-IR) 5 MG capsule Take 1 capsule (5 mg total) by mouth every 4 (four) hours as needed (for moderate to severe pain). 05/02/23   Marguerita Merles Latif, DO      Allergies    Sulfa antibiotics    Review of Systems   Review of Systems  Unable to perform ROS: Dementia    Physical Exam Updated Vital Signs BP 101/70 (BP Location: Left Arm)   Pulse 89   Temp 98.1 F (36.7 C) (Oral)   Resp 16   Ht 5\' 9"  (1.753 m)   Wt 64.5 kg   SpO2 100%   BMI 21.00 kg/m  Physical Exam Vitals and nursing note reviewed.  Constitutional:      General: He is not in acute distress.    Appearance: He is not ill-appearing.  HENT:     Head: Normocephalic.  Eyes:     Pupils: Pupils are equal, round, and reactive to light.  Cardiovascular:     Rate and Rhythm: Normal rate and regular rhythm.  Pulses: Normal pulses.     Heart sounds: Normal heart sounds. No murmur heard.    No friction rub. No gallop.  Pulmonary:     Effort: Pulmonary effort is normal.     Breath sounds: Normal breath sounds.  Abdominal:     General: Abdomen is flat. There is no distension.     Palpations: Abdomen is soft.     Tenderness: There is no abdominal tenderness. There is no guarding or rebound.     Comments: PC tube in place in RUQ. Erythema around insertion site. No purulent drainage. Abdomen soft, non-distended, non-tender.  Musculoskeletal:        General: Normal range of motion.     Cervical back: Neck supple.  Skin:    General: Skin is warm and dry.  Neurological:      General: No focal deficit present.     Mental Status: He is alert.  Psychiatric:        Mood and Affect: Mood normal.        Behavior: Behavior normal.     ED Results / Procedures / Treatments   Labs (all labs ordered are listed, but only abnormal results are displayed) Labs Reviewed  CBC WITH DIFFERENTIAL/PLATELET - Abnormal; Notable for the following components:      Result Value   WBC 10.6 (*)    RBC 3.44 (*)    Hemoglobin 11.1 (*)    HCT 33.7 (*)    RDW 16.5 (*)    Neutro Abs 8.2 (*)    All other components within normal limits  COMPREHENSIVE METABOLIC PANEL - Abnormal; Notable for the following components:   CO2 21 (*)    Glucose, Bld 109 (*)    BUN <5 (*)    Creatinine, Ser <0.30 (*)    Calcium 7.8 (*)    Total Protein 6.4 (*)    Albumin 1.8 (*)    AST 43 (*)    Alkaline Phosphatase 163 (*)    Total Bilirubin 2.4 (*)    All other components within normal limits  LIPASE, BLOOD  I-STAT CG4 LACTIC ACID, ED  I-STAT CG4 LACTIC ACID, ED    EKG None  Radiology CT ABDOMEN PELVIS W CONTRAST  Result Date: 05/05/2023 CLINICAL DATA:  Concern for percutaneous cholecystostomy tube dislodgement. EXAM: CT ABDOMEN AND PELVIS WITH CONTRAST TECHNIQUE: Multidetector CT imaging of the abdomen and pelvis was performed using the standard protocol following bolus administration of intravenous contrast. RADIATION DOSE REDUCTION: This exam was performed according to the departmental dose-optimization program which includes automated exposure control, adjustment of the mA and/or kV according to patient size and/or use of iterative reconstruction technique. CONTRAST:  OMNIPAQUE IOHEXOL 300 MG/ML  SOLN COMPARISON:  CT abdomen pelvis dated 05/01/2023. FINDINGS: Lower chest: Partially visualized small bilateral pleural effusions, new on the left. There is partial consolidative changes of the lower lobes which may represent atelectasis or infiltrate. There is advanced 3 vessel coronary  vascular calcification. No intra-abdominal free air.  Small ascites. Hepatobiliary: Fatty liver. There is slight irregularity of the liver contour which may represent early changes of cirrhosis. Clinical correlation is recommended. Similar positioning of the percutaneous cholecystostomy with pigtail tip in the body of the gallbladder. There is similar distension of the gallbladder as the prior CT. No calcified gallstone. Pancreas: The pancreas is unremarkable as visualized. Spleen: Normal in size without focal abnormality. Adrenals/Urinary Tract: The adrenal glands are unremarkable. There is no hydronephrosis on either side. There is symmetric enhancement of  the kidneys. The visualized ureters appear unremarkable. Mild thickened appearance of the bladder wall may be related to underdistention or chronic bladder outlet obstruction. Stomach/Bowel: There is sigmoid diverticulosis with a large diverticulum or possibly a complex contained perisigmoid collection measuring approximately 2.5 x 4.1 cm similar to prior CT. Similar appearance of thickened appearance of cecum and ascending colon, possibly secondary to ascites or hepatic colopathy. Colitis is not excluded clinical correlation is recommended. No bowel obstruction. The appendix is normal. Vascular/Lymphatic: Status post an aorto bi iliac endovascular stent graft repair of an infrarenal abdominal aortic aneurysm. Relatively similar appearance of excluded aneurysm sac as on the prior CT. There is stent graph appears patent. The IVC is unremarkable. No portal venous gas. There is no adenopathy. Reproductive: The prostate and seminal vesicles are grossly unremarkable. No pelvic mass. Other: Mild diffuse subcutaneous edema. Musculoskeletal: Degenerative changes of the spine. No acute osseous pathology. IMPRESSION: 1. Similar positioning of the percutaneous cholecystostomy with similar gallbladder distension as the prior CT. 2. Fatty liver with possible early changes of  cirrhosis. 3. Sigmoid diverticulosis with a large diverticulum or possibly a complex contained perisigmoid collection similar to prior CT. 4. Similar appearance of thickened appearance of cecum and ascending colon, possibly secondary to ascites or hepatic colopathy. Colitis is not excluded clinical correlation is recommended. No bowel obstruction. Normal appendix. 5. Partially visualized small bilateral pleural effusions, new on the left. There is partial consolidative changes of the lower lobes which may represent atelectasis or infiltrate. Electronically Signed   By: Elgie Collard M.D.   On: 05/05/2023 19:28    Procedures Procedures    Medications Ordered in ED Medications  iohexol (OMNIPAQUE) 300 MG/ML solution 100 mL (100 mLs Intravenous Contrast Given 05/05/23 1755)    ED Course/ Medical Decision Making/ A&P Clinical Course as of 05/05/23 2055  Wed May 05, 2023  1814 Lactic Acid, Venous: 1.9 [CA]  1814 WBC(!): 10.6 [CA]  1814 Hemoglobin(!): 11.1 [CA]  2027 Reassessed patient at bedside.  Patient resting comfortably in bed.  Has no complaints.  Is ready to be discharged. [CA]    Clinical Course User Index [CA] Mannie Stabile, PA-C                                 Medical Decision Making Amount and/or Complexity of Data Reviewed Labs: ordered. Decision-making details documented in ED Course. Radiology: ordered and independent interpretation performed. Decision-making details documented in ED Course. ECG/medicine tests: ordered and independent interpretation performed. Decision-making details documented in ED Course.  Risk Prescription drug management.   This patient presents to the ED for concern of PC tube dysfunction, this involves an extensive number of treatment options, and is a complaint that carries with it a high risk of complications and morbidity.  The differential diagnosis includes disposition, infection,  blockage, etc  62 year old male presents to the ED due  to possible cholecystostomy tube displacement.  Recent admission on 9/20 to 9/22 due to abdominal pain and purulent drainage surrounding biliary tube.  Patient started on IV Zosyn while in the ED and discharged with Augmentin and doxycycline.  Facility sent patient in due to concerns about displacement of tube.  Patient has no complaints during initial evaluation.  History of dementia.  Upon arrival patient afebrile, not tachycardic or hypoxic.  Patient in no acute distress.  Slight erythema surrounding insertion site of biliary tube.  No purulent drainage.  Abdomen soft, nondistended,  nontender.  Routine labs ordered.  CT abdomen to check placement of biliary tube.  CBC significant for leukocytosis at 10.6 which is an improvement from 3 days ago.  Mild anemia with hemoglobin 11.1.  Lipase normal.  CMP significant for elevated alk phos at 163 and AST at 43 which is improving from 3 days ago.  Total bilirubin improvement from 3 days ago as well.  Currently 2.4.  Lactic acid normal.  Low suspicion for sepsis.  Patient currently on Augmentin and doxycycline.  CT abdomen personally reviewed and interpreted which demonstrates similar positioning of biliary tube with similar gallbladder distention.  Also demonstrates early changes of cirrhosis, sigmoid diverticulosis with large diverticula, possible colitis, and pleural effusions.  Patient currently on Augmentin and doxycycline which will cover for colitis and possible infiltrate on CT scan.  Patient able to tolerate p.o. here in the ED.  Given improvement in labs and stable CT findings feel patient is stable for discharge back to his living facility.  Discussed with Dr. Particia Nearing who evaluated patient at bedside and agrees with assessment and plan. No evidence of sepsis. Strict ED precautions discussed with patient. Patient states understanding and agrees to plan. Patient discharged home in no acute distress and stable vitals  Lives in living facility Hx HTN,  CAD Has PCP       Final Clinical Impression(s) / ED Diagnoses Final diagnoses:  Cholecystostomy tube dysfunction, initial encounter    Rx / DC Orders ED Discharge Orders     None         Jesusita Oka 05/05/23 2058    Jacalyn Lefevre, MD 05/05/23 2336

## 2023-05-05 NOTE — ED Triage Notes (Signed)
Pt biba from facility. Patient orient to name and place intermittently. Per EMS this is patient baseline. Has hx of Dementia. Facility called EMS stating drain is out. On arrival, patient noted to have biliary drain in place, however not secured and appears to be coming out. Denies any other complaints. Patient denies pain. Afebrile on arrival. Redness noted around insertion site.

## 2023-05-05 NOTE — ED Notes (Signed)
Pt difficult IV stick  Phlebotomy notified and eta of 10 minutes

## 2023-05-05 NOTE — ED Notes (Signed)
Assisted with changing brief, and gown

## 2023-05-06 LAB — CULTURE, BLOOD (ROUTINE X 2)
Culture: NO GROWTH
Culture: NO GROWTH
Special Requests: ADEQUATE

## 2023-05-10 ENCOUNTER — Ambulatory Visit (HOSPITAL_COMMUNITY)
Admission: RE | Admit: 2023-05-10 | Discharge: 2023-05-10 | Disposition: A | Discharge status: Home / Self Care | Payer: Self-pay | Source: Ambulatory Visit | Attending: Student | Admitting: Student

## 2023-05-10 ENCOUNTER — Other Ambulatory Visit (HOSPITAL_COMMUNITY): Payer: Self-pay | Admitting: Student

## 2023-05-10 DIAGNOSIS — Z434 Encounter for attention to other artificial openings of digestive tract: Secondary | ICD-10-CM | POA: Insufficient documentation

## 2023-05-10 DIAGNOSIS — K810 Acute cholecystitis: Secondary | ICD-10-CM | POA: Insufficient documentation

## 2023-05-10 HISTORY — PX: IR EXCHANGE BILIARY DRAIN: IMG6046

## 2023-05-10 HISTORY — PX: IR CATHETER TUBE CHANGE: IMG717

## 2023-05-10 MED ORDER — IOHEXOL 300 MG/ML  SOLN
50.0000 mL | Freq: Once | INTRAMUSCULAR | Status: AC | PRN
  Administered 2023-05-10: 10 mL

## 2023-05-10 MED ORDER — LIDOCAINE HCL 1 % IJ SOLN
INTRAMUSCULAR | Status: AC
  Filled 2023-05-10: qty 20

## 2023-05-10 NOTE — Procedures (Signed)
Interventional Radiology Procedure Note  Procedure: fluoro cholecystostomy exchg    Complications: None  Estimated Blood Loss:  0  Findings: 33fr exchg     Sharen Counter, MD

## 2023-05-11 ENCOUNTER — Other Ambulatory Visit: Payer: Self-pay | Admitting: Student

## 2023-05-11 ENCOUNTER — Encounter: Payer: Self-pay | Admitting: Surgery

## 2023-05-11 ENCOUNTER — Ambulatory Visit (INDEPENDENT_AMBULATORY_CARE_PROVIDER_SITE_OTHER): Payer: Self-pay | Admitting: Surgery

## 2023-05-11 VITALS — BP 108/77 | HR 87 | Ht 68.5 in | Wt 137.0 lb

## 2023-05-11 DIAGNOSIS — K810 Acute cholecystitis: Secondary | ICD-10-CM

## 2023-05-11 DIAGNOSIS — Z434 Encounter for attention to other artificial openings of digestive tract: Secondary | ICD-10-CM | POA: Insufficient documentation

## 2023-05-11 NOTE — Patient Instructions (Signed)
Gallbladder Eating Plan High blood cholesterol, obesity, a sedentary lifestyle, an unhealthy diet, and diabetes are risk factors for developing gallstones. If you have a gallbladder condition, you may have trouble digesting fats and tolerating high fat intake. Eating a low-fat diet can help reduce your symptoms and may be helpful before and after having surgery to remove your gallbladder (cholecystectomy). Your health care provider may recommend that you work with a dietitian to help you reduce the amount of fat in your diet. What are tips for following this plan? General guidelines Limit your fat intake to less than 30% of your total daily calories. If you eat around 1,800 calories each day, this means eating less than 60 grams (g) of fat per day. Fat is an important part of a healthy diet. Eating a low-fat diet can make it hard to maintain a healthy body weight. Ask your dietitian how much fat, calories, and other nutrients you need each day. Eat small, frequent meals throughout the day instead of three large meals. Drink at least 8-10 cups (1.9-2.4 L) of fluid a day. Drink enough fluid to keep your urine pale yellow. If you drink alcohol: Limit how much you have to: 0-1 drink a day for women who are not pregnant. 0-2 drinks a day for men. Know how much alcohol is in a drink. In the [place] one drink equals one 12 oz bottle of beer (355 mL), one 5 oz glass of wine (148 mL), or one 1 oz glass of hard liquor (44 mL). Reading food labels  Check nutrition facts on food labels for the amount of fat per serving. Choose foods with less than 3 grams of fat per serving. Shopping Choose nonfat and low-fat healthy foods. Look for the words "nonfat," "low-fat," or "fat-free." Avoid buying processed or prepackaged foods. Cooking Cook using low-fat methods, such as baking, broiling, grilling, or boiling. Cook with small amounts of healthy fats, such as olive oil, grapeseed oil, canola oil, avocado oil, or  sunflower oil. What foods are recommended?  All fresh, frozen, or canned fruits and vegetables. Whole grains. Low-fat or nonfat (skim) milk and yogurt. Lean meat, skinless poultry, fish, eggs, and beans. Low-fat protein supplement powders or drinks. Spices and herbs. The items listed above may not be a complete list of foods and beverages you can eat and drink. Contact a dietitian for more information. What foods are not recommended? High-fat foods. These include baked goods, fast food, fatty cuts of meat, ice cream, french toast, sweet rolls, pizza, cheese bread, foods covered with butter, creamy sauces, or cheese. Fried foods. These include french fries, tempura, battered fish, breaded chicken, fried breads, and sweets. Foods that cause bloating and gas. The items listed above may not be a complete list of foods that you should avoid. Contact a dietitian for more information. Summary A low-fat diet can be helpful if you have a gallbladder condition, or before and after gallbladder surgery. Limit your fat intake to less than 30% of your total daily calories. This is about 60 g of fat if you eat 1,800 calories each day. Eat small, frequent meals throughout the day instead of three large meals. This information is not intended to replace advice given to you by your health care provider. Make sure you discuss any questions you have with your health care provider. Document Revised: 07/11/2021 Document Reviewed: 07/11/2021 Elsevier Patient Education  2024 [employer].
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
## 2023-05-28 ENCOUNTER — Other Ambulatory Visit: Payer: Self-pay

## 2023-05-28 ENCOUNTER — Emergency Department (HOSPITAL_COMMUNITY): Payer: Medicaid Other

## 2023-05-28 ENCOUNTER — Inpatient Hospital Stay (HOSPITAL_COMMUNITY)
Admission: EM | Admit: 2023-05-28 | Discharge: 2023-05-31 | DRG: 920 | Disposition: A | Discharge status: Skilled Nursing Facility | Payer: Medicaid Other | Source: Skilled Nursing Facility | Attending: Internal Medicine | Admitting: Internal Medicine

## 2023-05-28 ENCOUNTER — Encounter (HOSPITAL_COMMUNITY): Payer: Self-pay

## 2023-05-28 DIAGNOSIS — Z8249 Family history of ischemic heart disease and other diseases of the circulatory system: Secondary | ICD-10-CM

## 2023-05-28 DIAGNOSIS — K7031 Alcoholic cirrhosis of liver with ascites: Secondary | ICD-10-CM

## 2023-05-28 DIAGNOSIS — T85520A Displacement of bile duct prosthesis, initial encounter: Secondary | ICD-10-CM

## 2023-05-28 DIAGNOSIS — Z66 Do not resuscitate: Secondary | ICD-10-CM | POA: Diagnosis present

## 2023-05-28 DIAGNOSIS — Z882 Allergy status to sulfonamides status: Secondary | ICD-10-CM

## 2023-05-28 DIAGNOSIS — Z8614 Personal history of Methicillin resistant Staphylococcus aureus infection: Secondary | ICD-10-CM

## 2023-05-28 DIAGNOSIS — R64 Cachexia: Secondary | ICD-10-CM | POA: Diagnosis present

## 2023-05-28 DIAGNOSIS — R7401 Elevation of levels of liver transaminase levels: Secondary | ICD-10-CM | POA: Diagnosis present

## 2023-05-28 DIAGNOSIS — E785 Hyperlipidemia, unspecified: Secondary | ICD-10-CM | POA: Diagnosis present

## 2023-05-28 DIAGNOSIS — F17210 Nicotine dependence, cigarettes, uncomplicated: Secondary | ICD-10-CM | POA: Diagnosis present

## 2023-05-28 DIAGNOSIS — L89152 Pressure ulcer of sacral region, stage 2: Secondary | ICD-10-CM | POA: Diagnosis present

## 2023-05-28 DIAGNOSIS — K819 Cholecystitis, unspecified: Secondary | ICD-10-CM | POA: Diagnosis present

## 2023-05-28 DIAGNOSIS — E8770 Fluid overload, unspecified: Secondary | ICD-10-CM | POA: Diagnosis present

## 2023-05-28 DIAGNOSIS — Z515 Encounter for palliative care: Secondary | ICD-10-CM

## 2023-05-28 DIAGNOSIS — E871 Hypo-osmolality and hyponatremia: Secondary | ICD-10-CM | POA: Diagnosis present

## 2023-05-28 DIAGNOSIS — K760 Fatty (change of) liver, not elsewhere classified: Secondary | ICD-10-CM | POA: Diagnosis present

## 2023-05-28 DIAGNOSIS — I7143 Infrarenal abdominal aortic aneurysm, without rupture: Secondary | ICD-10-CM | POA: Diagnosis present

## 2023-05-28 DIAGNOSIS — L89621 Pressure ulcer of left heel, stage 1: Secondary | ICD-10-CM | POA: Diagnosis present

## 2023-05-28 DIAGNOSIS — F1020 Alcohol dependence, uncomplicated: Secondary | ICD-10-CM | POA: Diagnosis present

## 2023-05-28 DIAGNOSIS — R9431 Abnormal electrocardiogram [ECG] [EKG]: Secondary | ICD-10-CM | POA: Diagnosis present

## 2023-05-28 DIAGNOSIS — Z6820 Body mass index (BMI) 20.0-20.9, adult: Secondary | ICD-10-CM

## 2023-05-28 DIAGNOSIS — I739 Peripheral vascular disease, unspecified: Secondary | ICD-10-CM | POA: Diagnosis present

## 2023-05-28 DIAGNOSIS — L89611 Pressure ulcer of right heel, stage 1: Secondary | ICD-10-CM | POA: Diagnosis present

## 2023-05-28 DIAGNOSIS — Y732 Prosthetic and other implants, materials and accessory gastroenterology and urology devices associated with adverse incidents: Secondary | ICD-10-CM | POA: Diagnosis present

## 2023-05-28 DIAGNOSIS — F0390 Unspecified dementia without behavioral disturbance: Secondary | ICD-10-CM | POA: Diagnosis present

## 2023-05-28 DIAGNOSIS — I10 Essential (primary) hypertension: Secondary | ICD-10-CM | POA: Diagnosis present

## 2023-05-28 DIAGNOSIS — I2510 Atherosclerotic heart disease of native coronary artery without angina pectoris: Secondary | ICD-10-CM | POA: Diagnosis present

## 2023-05-28 DIAGNOSIS — E8809 Other disorders of plasma-protein metabolism, not elsewhere classified: Secondary | ICD-10-CM

## 2023-05-28 HISTORY — DX: Displacement of bile duct prosthesis, initial encounter: T85.520A

## 2023-05-28 LAB — CBC WITH DIFFERENTIAL/PLATELET
Abs Immature Granulocytes: 0.02 10*3/uL (ref 0.00–0.07)
Basophils Absolute: 0 10*3/uL (ref 0.0–0.1)
Basophils Relative: 0 %
Eosinophils Absolute: 0 10*3/uL (ref 0.0–0.5)
Eosinophils Relative: 0 %
HCT: 36.1 % — ABNORMAL LOW (ref 39.0–52.0)
Hemoglobin: 12.3 g/dL — ABNORMAL LOW (ref 13.0–17.0)
Immature Granulocytes: 0 %
Lymphocytes Relative: 27 %
Lymphs Abs: 1.8 10*3/uL (ref 0.7–4.0)
MCH: 31.9 pg (ref 26.0–34.0)
MCHC: 34.1 g/dL (ref 30.0–36.0)
MCV: 93.8 fL (ref 80.0–100.0)
Monocytes Absolute: 0.5 10*3/uL (ref 0.1–1.0)
Monocytes Relative: 8 %
Neutro Abs: 4.2 10*3/uL (ref 1.7–7.7)
Neutrophils Relative %: 65 %
Platelets: 288 10*3/uL (ref 150–400)
RBC: 3.85 MIL/uL — ABNORMAL LOW (ref 4.22–5.81)
RDW: 15 % (ref 11.5–15.5)
WBC: 6.5 10*3/uL (ref 4.0–10.5)
nRBC: 0 % (ref 0.0–0.2)

## 2023-05-28 LAB — MAGNESIUM: Magnesium: 1.8 mg/dL (ref 1.7–2.4)

## 2023-05-28 LAB — COMPREHENSIVE METABOLIC PANEL
ALT: 14 U/L (ref 0–44)
AST: 37 U/L (ref 15–41)
Albumin: 1.6 g/dL — ABNORMAL LOW (ref 3.5–5.0)
Alkaline Phosphatase: 160 U/L — ABNORMAL HIGH (ref 38–126)
Anion gap: 7 (ref 5–15)
BUN: 12 mg/dL (ref 8–23)
CO2: 23 mmol/L (ref 22–32)
Calcium: 7.5 mg/dL — ABNORMAL LOW (ref 8.9–10.3)
Chloride: 96 mmol/L — ABNORMAL LOW (ref 98–111)
Creatinine, Ser: 0.69 mg/dL (ref 0.61–1.24)
GFR, Estimated: >60 mL/min (ref >60)
Glucose, Bld: 94 mg/dL (ref 70–99)
Potassium: 4.2 mmol/L (ref 3.5–5.1)
Sodium: 126 mmol/L — ABNORMAL LOW (ref 135–145)
Total Bilirubin: 1.4 mg/dL — ABNORMAL HIGH (ref 0.3–1.2)
Total Protein: 5.8 g/dL — ABNORMAL LOW (ref 6.5–8.1)

## 2023-05-28 LAB — LIPASE, BLOOD: Lipase: 25 U/L (ref 11–51)

## 2023-05-28 LAB — PHOSPHORUS: Phosphorus: 3.8 mg/dL (ref 2.5–4.6)

## 2023-05-28 MED ORDER — PROCHLORPERAZINE EDISYLATE 10 MG/2ML IJ SOLN: 5.0000 mg | INTRAMUSCULAR | Status: DC | PRN

## 2023-05-28 MED ORDER — ACETAMINOPHEN 325 MG PO TABS
650.0000 mg | ORAL_TABLET | Freq: Four times a day (QID) | ORAL | Status: DC | PRN
  Administered 2023-05-29 – 2023-05-30 (×2): 650 mg via ORAL
  Filled 2023-05-28 (×2): qty 2

## 2023-05-28 MED ORDER — ENOXAPARIN SODIUM 40 MG/0.4ML IJ SOSY
40.0000 mg | PREFILLED_SYRINGE | INTRAMUSCULAR | Status: DC
  Administered 2023-05-28 – 2023-05-30 (×3): 40 mg via SUBCUTANEOUS
  Filled 2023-05-28 (×3): qty 0.4

## 2023-05-28 MED ORDER — ACETAMINOPHEN 650 MG RE SUPP: 650.0000 mg | Freq: Four times a day (QID) | RECTAL | Status: DC | PRN

## 2023-05-28 MED ORDER — MELATONIN 5 MG PO TABS
5.0000 mg | ORAL_TABLET | Freq: Every day | ORAL | Status: DC
  Administered 2023-05-28 – 2023-05-30 (×3): 5 mg via ORAL
  Filled 2023-05-28 (×3): qty 1

## 2023-05-28 NOTE — ED Notes (Signed)
ED TO INPATIENT HANDOFF REPORT  ED Nurse Name and Phone #: Gypsy Balsam [phone]  S Name/Age/Gender Ryan Rose 62 y.o. male Room/Bed: WHALD/WHALD  Code Status   Code Status: Do not attempt resuscitation (DNR) - Comfort care  Home/SNF/Other Skilled nursing facility Patient oriented to: self and DOB Is this baseline? Yes   Triage Complete: Triage complete  Chief Complaint Biliary drain displacement, initial encounter [T85.520A]  Triage Note Patient BIB EMS from [facility]. Patient pulled biliary tube today. Unknown how long it has been out.    Allergies Allergies  Allergen Reactions   Sulfa Antibiotics Hives    Level of Care/Admitting Diagnosis ED Disposition     ED Disposition  Admit   Condition  --   Comment  Hospital Area: [facility] [[id number]]  Level of Care: Med-Surg [16]  May place patient in observation at [facility] or [facility] if equivalent level of care is available:: Yes  Covid Evaluation: Asymptomatic - no recent exposure (last 10 days) testing not required  Diagnosis: Biliary drain displacement, initial encounter [147829]  Admitting Physician: Gery Pray [[id number]  Attending Physician: Alvester Chou          B Medical/Surgery History Past Medical History:  Diagnosis Date   Anemia    Aortic atherosclerosis (HCC)    Arthritis    Bilateral carpal tunnel syndrome    Coronary artery disease    Diastolic dysfunction    a.) TTE 08/24/2022: EF 60-65%, mild-mod MR, G1DD.   ETOH abuse    Hepatic steatosis    History of methicillin resistant staphylococcus aureus (MRSA)    HLD (hyperlipidemia)    HTN (hypertension)    Infrarenal abdominal aortic aneurysm (AAA) without rupture (HCC) 05/28/2022   a.) CT AP 05/28/2022: saccular thrombosed; measures 4.4 x 3.2 cm   Nausea & vomiting 03/21/2023   PVD (peripheral vascular disease) with claudication (HCC)    Sigmoid diverticulosis    Transaminitis    Past Surgical  History:  Procedure Laterality Date   CERVICAL LAMINOPLASTY  08/20/2021   right C3-C6   COLONOSCOPY     ENDOVASCULAR REPAIR/STENT GRAFT N/A 09/09/2022   Procedure: ENDOVASCULAR REPAIR/STENT GRAFT;  Surgeon: Renford Dills, MD;  Location: [facility] INVASIVE CV LAB;  Service: Cardiovascular;  Laterality: N/A;   ESOPHAGOGASTRODUODENOSCOPY N/A 03/25/2023   Procedure: ESOPHAGOGASTRODUODENOSCOPY (EGD);  Surgeon: Wyline Mood, MD;  Location: [facility] ENDOSCOPY;  Service: Gastroenterology;  Laterality: N/A;   HOT HEMOSTASIS  03/25/2023   Procedure: HOT HEMOSTASIS (ARGON PLASMA COAGULATION/BICAP);  Surgeon: Wyline Mood, MD;  Location: [facility] ENDOSCOPY;  Service: Gastroenterology;;   IR EXCHANGE BILIARY DRAIN  05/10/2023   IR PERC CHOLECYSTOSTOMY  04/02/2023   SKIN GRAFT     to hand and forearm     A IV Location/Drains/Wounds Patient Lines/Drains/Airways Status     Active Line/Drains/Airways     Name Placement date Placement time Site Days   Peripheral IV 05/28/23 20 G 1" Right Antecubital 05/28/23  1751  Antecubital  less than 1   Biliary Tube Cook slip-coat 10.2 Fr. RUQ 05/10/23  0948  RUQ  18   Pressure Injury 04/12/23 Coccyx Medial Stage 2 -  Partial thickness loss of dermis presenting as a shallow open injury with a red, pink wound bed without slough. open area on sacrum 04/12/23  0900  -- 46            Intake/Output Last 24 hours No intake or output data in the 24 hours ending 05/28/23 1952  Labs/Imaging Results for orders placed or performed during the hospital encounter of 05/28/23 (from the past 48 hour(s))  CBC with Differential     Status: Abnormal   Collection Time: 05/28/23  5:48 PM  Result Value Ref Range   WBC 6.5 4.0 - 10.5 K/uL   RBC 3.85 (L) 4.22 - 5.81 MIL/uL   Hemoglobin 12.3 (L) 13.0 - 17.0 g/dL   HCT 40.9 (L) 81.1 - 91.4 %   MCV 93.8 80.0 - 100.0 fL   MCH 31.9 26.0 - 34.0 pg   MCHC 34.1 30.0 - 36.0 g/dL   RDW 78.2 95.6 - 21.3 %   Platelets 288 150 - 400 K/uL   nRBC  0.0 0.0 - 0.2 %   Neutrophils Relative % 65 %   Neutro Abs 4.2 1.7 - 7.7 K/uL   Lymphocytes Relative 27 %   Lymphs Abs 1.8 0.7 - 4.0 K/uL   Monocytes Relative 8 %   Monocytes Absolute 0.5 0.1 - 1.0 K/uL   Eosinophils Relative 0 %   Eosinophils Absolute 0.0 0.0 - 0.5 K/uL   Basophils Relative 0 %   Basophils Absolute 0.0 0.0 - 0.1 K/uL   Immature Granulocytes 0 %   Abs Immature Granulocytes 0.02 0.00 - 0.07 K/uL    Comment: Performed at [facility], [address] [place], [place] [address]  Comprehensive metabolic panel     Status: Abnormal   Collection Time: 05/28/23  5:48 PM  Result Value Ref Range   Sodium 126 (L) 135 - 145 mmol/L   Potassium 4.2 3.5 - 5.1 mmol/L   Chloride 96 (L) 98 - 111 mmol/L   CO2 23 22 - 32 mmol/L   Glucose, Bld 94 70 - 99 mg/dL    Comment: Glucose reference range applies only to samples taken after fasting for at least 8 hours.   BUN 12 8 - 23 mg/dL   Creatinine, Ser 8.46 0.61 - 1.24 mg/dL   Calcium 7.5 (L) 8.9 - 10.3 mg/dL   Total Protein 5.8 (L) 6.5 - 8.1 g/dL   Albumin 1.6 (L) 3.5 - 5.0 g/dL   AST 37 15 - 41 U/L   ALT 14 0 - 44 U/L   Alkaline Phosphatase 160 (H) 38 - 126 U/L   Total Bilirubin 1.4 (H) 0.3 - 1.2 mg/dL   GFR, Estimated >96 >29 mL/min    Comment: (NOTE) Calculated using the CKD-EPI Creatinine Equation (2021)    Anion gap 7 5 - 15    Comment: Performed at [facility], [address], [place], [place] [address]  Lipase, blood     Status: None   Collection Time: 05/28/23  5:48 PM  Result Value Ref Range   Lipase 25 11 - 51 U/L    Comment: Performed at [facility], [address], [place], [place] [address]   US Abdomen Limited RUQ (LIVER/GB)  Result Date: 05/28/2023 CLINICAL DATA:  Pain. EXAM: ULTRASOUND ABDOMEN LIMITED RIGHT UPPER QUADRANT COMPARISON:  CT scan abdomen and pelvis from 05/05/2023. FINDINGS: The technologist noted limited exam due to patient's inability to  roll, hold breath. Patient is confused and unaware of exam. Gallbladder: The gallbladder is contracted and exhibits Iso to hyperechoic nonvascular areas within, which may represent sludge versus hemorrhage. Measurement of gallbladder wall thickness is limited on the provided images. No pericholecystic free fluid seen. A percutaneous cholecystostomy tube seen on the prior CT scan abdomen and pelvis is not seen on this exam. Common  bile duct: Diameter: Up to 4 mm.  No intrahepatic bile duct dilation. Liver: There is liver surface irregularity/nodularity and heterogeneous echotexture, compatible with cirrhosis. There is increased hepatic echogenicity. Portal vein is patent on color Doppler imaging with normal direction of blood flow towards the liver. Other: Small-to-moderate ascites noted. IMPRESSION: 1. Contracted gallbladder containing sludge versus hemorrhage. 2. Hepatic cirrhosis. 3. Small-to-moderate ascites. Electronically Signed   By: Jules Schick M.D.   On: 05/28/2023 18:14    Pending Labs Unresulted Labs (From admission, onward)     Start     Ordered   06/04/23 0500  Creatinine, serum  (enoxaparin (LOVENOX)    CrCl >/= 30 ml/min)  Weekly,   R     Comments: while on enoxaparin therapy    05/28/23 1930   05/29/23 0500  Comprehensive metabolic panel  Tomorrow morning,   R        05/28/23 1930   05/29/23 0500  CBC with Differential/Platelet  Tomorrow morning,   R        05/28/23 1930   05/29/23 0500  Ammonia  Tomorrow morning,   R        05/28/23 1941   05/29/23 0500  Protime-INR  Tomorrow morning,   R        05/28/23 1941   05/28/23 1930  Magnesium  Add-on,   AD        05/28/23 1929   05/28/23 1930  Phosphorus  Add-on,   AD        05/28/23 1929            Vitals/Pain Today's Vitals   05/28/23 1547 05/28/23 1550 05/28/23 1852  BP:  106/83 110/86  Pulse:  88 90  Resp:  18 16  Temp:  (!) 97.5 F (36.4 C) 97.7 F (36.5 C)  TempSrc:  Oral Oral  SpO2:  100% 100%  Weight: 62.1  kg    Height: 5' 8.5" (1.74 m)      Isolation Precautions No active isolations  Medications Medications  enoxaparin (LOVENOX) injection 40 mg (has no administration in time range)  acetaminophen (TYLENOL) tablet 650 mg (has no administration in time range)    Or  acetaminophen (TYLENOL) suppository 650 mg (has no administration in time range)  prochlorperazine (COMPAZINE) injection 5 mg (has no administration in time range)  melatonin tablet 5 mg (has no administration in time range)    Mobility walks with device(walker) but super weak at the moment and says he can't walk that good right now       R  Report given to:

## 2023-05-28 NOTE — ED Provider Notes (Signed)
[place] EMERGENCY DEPARTMENT AT [facility] Provider Note   CSN: [id number] Arrival date & time: 05/28/23  1531     History  Chief Complaint  Patient presents with   Biliary Tube Displacement    Ryan Rose is a 62 y.o. male.  Patient here from [facility] after his biliary tube was pulled out.  Not sure how long it has been out for.  History is limited due to history of dementia.  He denies any pain nausea vomiting diarrhea.  No fever.  The history is provided by the patient and the EMS personnel.       Home Medications Prior to Admission medications   Medication Sig Start Date End Date Taking? Authorizing Provider  acetaminophen (TYLENOL) 325 MG tablet Take 2 tablets (650 mg total) by mouth every 6 (six) hours as needed for mild pain (or Fever >/= 101). 05/02/23   Sheikh, Omair Latif, DO  ATIVAN 0.5 MG tablet Take 1 tablet (0.5 mg total) by mouth See admin instructions. Take 0.5 mg by mouth at 12 MIDNIGHT, 6 AM, 12 NOON, and 6 PM 05/02/23   Sheikh, Omair Latif, DO  melatonin 3 MG TABS tablet Take 3 mg by mouth at bedtime.    [provider]  nicotine (NICODERM CQ - DOSED IN MG/24 HOURS) 14 mg/24hr patch Place 1 patch (14 mg total) onto the skin daily. 04/24/23   [name], MD  oxycodone (OXY-IR) 5 MG capsule Take 1 capsule (5 mg total) by mouth every 4 (four) hours as needed (for moderate to severe pain). 05/02/23   Marguerita Merles Latif, DO      Allergies    Sulfa antibiotics    Review of Systems   Review of Systems  Physical Exam Updated Vital Signs BP 106/83 (BP Location: Left Arm)   Pulse 88   Temp (!) 97.5 F (36.4 C) (Oral)   Resp 18   Ht 5' 8.5" (1.74 m)   Wt 62.1 kg   SpO2 100%   BMI 20.53 kg/m  Physical Exam Vitals and nursing note reviewed.  Constitutional:      General: He is not in acute distress.    Appearance: He is well-developed.  HENT:     Head: Normocephalic and atraumatic.  Eyes:      Conjunctiva/sclera: Conjunctivae normal.  Cardiovascular:     Rate and Rhythm: Normal rate and regular rhythm.     Pulses: Normal pulses.     Heart sounds: Normal heart sounds. No murmur heard. Pulmonary:     Effort: Pulmonary effort is normal. No respiratory distress.     Breath sounds: Normal breath sounds.  Abdominal:     Palpations: Abdomen is soft.     Tenderness: There is no abdominal tenderness.     Comments: Biliary drain is pulled out but still stitch to his abdominal wall this was  Musculoskeletal:        General: No swelling.     Cervical back: Neck supple.  Skin:    General: Skin is warm and dry.     Capillary Refill: Capillary refill takes less than 2 seconds.  Neurological:     Mental Status: He is alert.  Psychiatric:        Mood and Affect: Mood normal.     ED Results / Procedures / Treatments   Labs (all labs ordered are listed, but only abnormal results are displayed) Labs Reviewed  CBC WITH DIFFERENTIAL/PLATELET - Abnormal; Notable for the following components:  Result Value   RBC 3.85 (*)    Hemoglobin 12.3 (*)    HCT 36.1 (*)    All other components within normal limits  COMPREHENSIVE METABOLIC PANEL - Abnormal; Notable for the following components:   Sodium 126 (*)    Chloride 96 (*)    Calcium 7.5 (*)    Total Protein 5.8 (*)    Albumin 1.6 (*)    Alkaline Phosphatase 160 (*)    Total Bilirubin 1.4 (*)    All other components within normal limits  LIPASE, BLOOD    EKG None  Radiology US Abdomen Limited RUQ (LIVER/GB)  Result Date: 05/28/2023 CLINICAL DATA:  Pain. EXAM: ULTRASOUND ABDOMEN LIMITED RIGHT UPPER QUADRANT COMPARISON:  CT scan abdomen and pelvis from 05/05/2023. FINDINGS: The technologist noted limited exam due to patient's inability to roll, hold breath. Patient is confused and unaware of exam. Gallbladder: The gallbladder is contracted and exhibits Iso to hyperechoic nonvascular areas within, which may represent sludge  versus hemorrhage. Measurement of gallbladder wall thickness is limited on the provided images. No pericholecystic free fluid seen. A percutaneous cholecystostomy tube seen on the prior CT scan abdomen and pelvis is not seen on this exam. Common bile duct: Diameter: Up to 4 mm.  No intrahepatic bile duct dilation. Liver: There is liver surface irregularity/nodularity and heterogeneous echotexture, compatible with cirrhosis. There is increased hepatic echogenicity. Portal vein is patent on color Doppler imaging with normal direction of blood flow towards the liver. Other: Small-to-moderate ascites noted. IMPRESSION: 1. Contracted gallbladder containing sludge versus hemorrhage. 2. Hepatic cirrhosis. 3. Small-to-moderate ascites. Electronically Signed   By: Jules Schick M.D.   On: 05/28/2023 18:14    Procedures Procedures    Medications Ordered in ED Medications - No data to display  ED Course/ Medical Decision Making/ A&P                                 Medical Decision Making Amount and/or Complexity of Data Reviewed Labs: ordered. Radiology: ordered.  Risk Decision regarding hospitalization.   Ryan Rose is here with biliary tube displacement.  Treated with biliary drain for cholecystitis or recently.  He supposed to have it in for another month.  Has been doing well per general surgery note a few weeks ago.  History of dementia.  Not sure when the tube was pulled out suspect may be in the last day or so.  He is well-appearing.  Does not appear to be toxic.  Has no abdominal pain.  I talked with Dr. Elby Showers with interventional radiology who will replace the drain tomorrow morning.  If he has any decompensation or fever or hypertension can reach out for more emergent placement but at this time he appears hemodynamically stable without fever.  Will check basic labs.  They have requested a right upper quadrant ultrasound and will get that done for the interventional radiology team.   Patient to be admitted to hospitalist.  Lab work over unremarkable.  Per my review interpretation of labs hemoglobin is at baseline at 12.2.  Sodium however little bit low at 126.  Suspect may be from dehydration.  Gallbladder and liver enzymes and bilirubin stable.  Ultrasound shows contracted gallbladder containing sludge versus hemorrhage.  Overall I talked with interventional radiology Dr. Vic Ripper who will place drain tomorrow or his team.  They have no concern for hemorrhage at this time given stable vitals and hemoglobin.  This is likely sludge.  Patient to be admitted to medicine.  This chart was dictated using voice recognition software.  Despite best efforts to proofread,  errors can occur which can change the documentation meaning.         Final Clinical Impression(s) / ED Diagnoses Final diagnoses:  Biliary drain displacement, initial encounter    Rx / DC Orders ED Discharge Orders     None         Virgina Norfolk, DO 05/28/23 1842

## 2023-05-28 NOTE — Plan of Care (Signed)
  Problem: Education: Goal: Knowledge of discharge needs will improve Outcome: Progressing   Problem: Clinical Measurements: Goal: Postoperative complications will be avoided or minimized Outcome: Progressing   Problem: Respiratory: Goal: Ability to achieve and maintain a regular respiratory rate will improve Outcome: Progressing   Problem: Skin Integrity: Goal: Demonstration of wound healing without infection will improve Outcome: Progressing   Problem: Fluid Volume: Goal: Hemodynamic stability will improve Outcome: Progressing   Problem: Clinical Measurements: Goal: Diagnostic test results will improve Outcome: Progressing Goal: Signs and symptoms of infection will decrease Outcome: Progressing   Problem: Respiratory: Goal: Ability to maintain adequate ventilation will improve Outcome: Progressing   Problem: Education: Goal: Knowledge of General Education information will improve Description: Including pain rating scale, medication(s)/side effects and non-pharmacologic comfort measures Outcome: Progressing   Problem: Health Behavior/Discharge Planning: Goal: Ability to manage health-related needs will improve Outcome: Progressing   Problem: Clinical Measurements: Goal: Ability to maintain clinical measurements within normal limits will improve Outcome: Progressing Goal: Will remain free from infection Outcome: Progressing Goal: Diagnostic test results will improve Outcome: Progressing Goal: Respiratory complications will improve Outcome: Progressing Goal: Cardiovascular complication will be avoided Outcome: Progressing   Problem: Activity: Goal: Risk for activity intolerance will decrease Outcome: Progressing   Problem: Nutrition: Goal: Adequate nutrition will be maintained Outcome: Progressing   Problem: Coping: Goal: Level of anxiety will decrease Outcome: Progressing   Problem: Elimination: Goal: Will not experience complications related to bowel  motility Outcome: Progressing Goal: Will not experience complications related to urinary retention Outcome: Progressing   Problem: Pain Managment: Goal: General experience of comfort will improve Outcome: Progressing   Problem: Safety: Goal: Ability to remain free from injury will improve Outcome: Progressing   Problem: Skin Integrity: Goal: Risk for impaired skin integrity will decrease Outcome: Progressing

## 2023-05-28 NOTE — ED Triage Notes (Signed)
Patient BIB EMS from [facility]. Patient pulled biliary tube today. Unknown how long it has been out.
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
## 2023-05-29 DIAGNOSIS — E785 Hyperlipidemia, unspecified: Secondary | ICD-10-CM | POA: Diagnosis present

## 2023-05-29 DIAGNOSIS — T85520A Displacement of bile duct prosthesis, initial encounter: Secondary | ICD-10-CM | POA: Diagnosis present

## 2023-05-29 DIAGNOSIS — F1020 Alcohol dependence, uncomplicated: Secondary | ICD-10-CM | POA: Diagnosis present

## 2023-05-29 DIAGNOSIS — L89152 Pressure ulcer of sacral region, stage 2: Secondary | ICD-10-CM | POA: Diagnosis present

## 2023-05-29 DIAGNOSIS — K819 Cholecystitis, unspecified: Secondary | ICD-10-CM | POA: Diagnosis present

## 2023-05-29 DIAGNOSIS — Z515 Encounter for palliative care: Secondary | ICD-10-CM | POA: Diagnosis not present

## 2023-05-29 DIAGNOSIS — L89611 Pressure ulcer of right heel, stage 1: Secondary | ICD-10-CM | POA: Diagnosis present

## 2023-05-29 DIAGNOSIS — Z6820 Body mass index (BMI) 20.0-20.9, adult: Secondary | ICD-10-CM | POA: Diagnosis not present

## 2023-05-29 DIAGNOSIS — I2510 Atherosclerotic heart disease of native coronary artery without angina pectoris: Secondary | ICD-10-CM | POA: Diagnosis present

## 2023-05-29 DIAGNOSIS — R64 Cachexia: Secondary | ICD-10-CM | POA: Diagnosis present

## 2023-05-29 DIAGNOSIS — Z8614 Personal history of Methicillin resistant Staphylococcus aureus infection: Secondary | ICD-10-CM | POA: Diagnosis not present

## 2023-05-29 DIAGNOSIS — E8770 Fluid overload, unspecified: Secondary | ICD-10-CM | POA: Diagnosis present

## 2023-05-29 DIAGNOSIS — F0390 Unspecified dementia without behavioral disturbance: Secondary | ICD-10-CM | POA: Diagnosis present

## 2023-05-29 DIAGNOSIS — Z8249 Family history of ischemic heart disease and other diseases of the circulatory system: Secondary | ICD-10-CM | POA: Diagnosis not present

## 2023-05-29 DIAGNOSIS — F17210 Nicotine dependence, cigarettes, uncomplicated: Secondary | ICD-10-CM | POA: Diagnosis present

## 2023-05-29 DIAGNOSIS — I739 Peripheral vascular disease, unspecified: Secondary | ICD-10-CM | POA: Diagnosis present

## 2023-05-29 DIAGNOSIS — I7143 Infrarenal abdominal aortic aneurysm, without rupture: Secondary | ICD-10-CM | POA: Diagnosis present

## 2023-05-29 DIAGNOSIS — E871 Hypo-osmolality and hyponatremia: Secondary | ICD-10-CM | POA: Diagnosis present

## 2023-05-29 DIAGNOSIS — K760 Fatty (change of) liver, not elsewhere classified: Secondary | ICD-10-CM | POA: Diagnosis present

## 2023-05-29 DIAGNOSIS — Y732 Prosthetic and other implants, materials and accessory gastroenterology and urology devices associated with adverse incidents: Secondary | ICD-10-CM | POA: Diagnosis present

## 2023-05-29 DIAGNOSIS — K7031 Alcoholic cirrhosis of liver with ascites: Secondary | ICD-10-CM | POA: Diagnosis present

## 2023-05-29 DIAGNOSIS — Z66 Do not resuscitate: Secondary | ICD-10-CM | POA: Diagnosis present

## 2023-05-29 DIAGNOSIS — I10 Essential (primary) hypertension: Secondary | ICD-10-CM | POA: Diagnosis present

## 2023-05-29 DIAGNOSIS — Z882 Allergy status to sulfonamides status: Secondary | ICD-10-CM | POA: Diagnosis not present

## 2023-05-29 DIAGNOSIS — E8809 Other disorders of plasma-protein metabolism, not elsewhere classified: Secondary | ICD-10-CM | POA: Diagnosis present

## 2023-05-29 LAB — PROTIME-INR
INR: 1.2 (ref 0.8–1.2)
Prothrombin Time: 15.9 s — ABNORMAL HIGH (ref 11.4–15.2)

## 2023-05-29 LAB — CBC WITH DIFFERENTIAL/PLATELET
Abs Immature Granulocytes: 0.02 10*3/uL (ref 0.00–0.07)
Basophils Absolute: 0 10*3/uL (ref 0.0–0.1)
Basophils Relative: 1 %
Eosinophils Absolute: 0 10*3/uL (ref 0.0–0.5)
Eosinophils Relative: 0 %
HCT: 35.8 % — ABNORMAL LOW (ref 39.0–52.0)
Hemoglobin: 12 g/dL — ABNORMAL LOW (ref 13.0–17.0)
Immature Granulocytes: 0 %
Lymphocytes Relative: 24 %
Lymphs Abs: 1.6 10*3/uL (ref 0.7–4.0)
MCH: 31.7 pg (ref 26.0–34.0)
MCHC: 33.5 g/dL (ref 30.0–36.0)
MCV: 94.7 fL (ref 80.0–100.0)
Monocytes Absolute: 0.5 10*3/uL (ref 0.1–1.0)
Monocytes Relative: 7 %
Neutro Abs: 4.7 10*3/uL (ref 1.7–7.7)
Neutrophils Relative %: 68 %
Platelets: 279 10*3/uL (ref 150–400)
RBC: 3.78 MIL/uL — ABNORMAL LOW (ref 4.22–5.81)
RDW: 14.8 % (ref 11.5–15.5)
WBC: 6.9 10*3/uL (ref 4.0–10.5)
nRBC: 0 % (ref 0.0–0.2)

## 2023-05-29 LAB — COMPREHENSIVE METABOLIC PANEL
ALT: 15 U/L (ref 0–44)
AST: 37 U/L (ref 15–41)
Albumin: 1.6 g/dL — ABNORMAL LOW (ref 3.5–5.0)
Alkaline Phosphatase: 175 U/L — ABNORMAL HIGH (ref 38–126)
Anion gap: 8 (ref 5–15)
BUN: 13 mg/dL (ref 8–23)
CO2: 24 mmol/L (ref 22–32)
Calcium: 7.6 mg/dL — ABNORMAL LOW (ref 8.9–10.3)
Chloride: 96 mmol/L — ABNORMAL LOW (ref 98–111)
Creatinine, Ser: 0.56 mg/dL — ABNORMAL LOW (ref 0.61–1.24)
GFR, Estimated: >60 mL/min (ref >60)
Glucose, Bld: 94 mg/dL (ref 70–99)
Potassium: 4.1 mmol/L (ref 3.5–5.1)
Sodium: 128 mmol/L — ABNORMAL LOW (ref 135–145)
Total Bilirubin: 1.3 mg/dL — ABNORMAL HIGH (ref 0.3–1.2)
Total Protein: 5.8 g/dL — ABNORMAL LOW (ref 6.5–8.1)

## 2023-05-29 LAB — TSH: TSH: 6.644 u[IU]/mL — ABNORMAL HIGH (ref 0.350–4.500)

## 2023-05-29 LAB — OSMOLALITY: Osmolality: 278 mosm/kg (ref 275–295)

## 2023-05-29 LAB — AMMONIA: Ammonia: 26 umol/L (ref 9–35)

## 2023-05-29 LAB — MRSA NEXT GEN BY PCR, NASAL: MRSA by PCR Next Gen: DETECTED — AB

## 2023-05-29 MED ORDER — CHLORHEXIDINE GLUCONATE CLOTH 2 % EX PADS
6.0000 | MEDICATED_PAD | Freq: Every day | CUTANEOUS | Status: DC
  Administered 2023-05-30 – 2023-05-31 (×2): 6 via TOPICAL

## 2023-05-29 MED ORDER — MIDODRINE HCL 5 MG PO TABS
2.5000 mg | ORAL_TABLET | Freq: Three times a day (TID) | ORAL | Status: DC
  Administered 2023-05-29 – 2023-05-31 (×7): 2.5 mg via ORAL
  Filled 2023-05-29 (×6): qty 1

## 2023-05-29 MED ORDER — ORAL CARE MOUTH RINSE: 15.0000 mL | OROMUCOSAL | Status: DC | PRN

## 2023-05-29 MED ORDER — MUPIROCIN 2 % EX OINT
1.0000 | TOPICAL_OINTMENT | Freq: Two times a day (BID) | CUTANEOUS | Status: DC
  Administered 2023-05-29 – 2023-05-31 (×5): 1 via NASAL
  Filled 2023-05-29: qty 22

## 2023-05-29 MED ORDER — ORAL CARE MOUTH RINSE
15.0000 mL | OROMUCOSAL | Status: DC
  Administered 2023-05-29 – 2023-05-31 (×9): 15 mL via OROMUCOSAL

## 2023-05-29 MED ORDER — ENSURE ENLIVE PO LIQD
237.0000 mL | Freq: Two times a day (BID) | ORAL | Status: DC
  Administered 2023-05-29: 237 mL via ORAL

## 2023-05-29 NOTE — Progress Notes (Signed)
Ryan Rose [address] [facility] Hospitalized Hospice Patient Visit  [name] is a current hospice patient, with hospice diagnosos of cerebrovascular disease unspecified, who was admitted to [facility] on 10.18.24 with biliary tube displacement. [facility] was notified of tube displacement and patient being sent to ED by facility. Per Dr. Gordy Savers, hospice physician, this is a related hospital admission.   Visited with patient in hospital. He is resting quietly and easily aroused. He denies complaint at this time. Exchanged report with bedside nurse who reports plan is to observe for need to reinsert biliary tube if symptomatic. Attempted to contact wife by phone and voicemail left.   Patient remains GIP appropriate due to need for close clinical observation of recent biliary tube removal and possible need for reinsertion if symptomatic.  Vital Signs: 97.6/102/12    90/66     100% on RA  I&O:  not documented  Abnormal labs: Na 128, Cre 56, Ca 7.6, Alb 1.6, Total Bili 1.3, HGB 12, PT 15.9  Diagnostics:  US abdomen, limited, right upper quadrant.  IMPRESSION: 1. Contracted gallbladder containing sludge versus hemorrhage. 2. Hepatic cirrhosis. 3. Small-to-moderate ascites.  IV/PRN Meds: none at this time.  Problem List:  Biliary drain displacement: given fairly benign clinical presentation, ultrasound abdomen no significant finding they decided to hold off on replacing cholecystostomy tube at this time.  If patient has any change in clinical status would consider CT abdomen pelvis with IV contrast before putting new cholecystostomy tube Will monitor him overnight   Soft blood pressure/hypotension: Holding in 90s.  Likely in the setting of liver cirrhosis.add midodrine 5 mg itd   Alcoholic liver cirrhosis with ascites Hypoalbuminemia Transaminitis: LFTs steadily improving.  Monitor ammonia INR and labs   Hyponatremia: Suspect in the setting of  hypovolemia, liver cirrhosis.  Monitor    Dementia: Delirium precaution fall precaution   Discharge Planning: Ongoing  Family Contact: Voicemail left for spouse.  IDT: Updated  Goals of Care: DNR  Should patient need ambulance transfer at discharge- please use [facility]) as they contract this service for our active hospice patients.  Glenna Fellows BSN, RN, [facility] hospital liaison [phone]
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
## 2023-05-29 NOTE — Progress Notes (Signed)
Patient ID: Ryan Rose, male   DOB: May 04, 1961, 62 y.o.   MRN: [id number] Message received from [facility] today regarding possible need for cholecystostomy tube replacement in patient.  Tube was initially placed on 04/02/2023 and exchanged last on 05/10/2023.  Patient presented to [name], ED yesterday evening with displaced gallbladder drain.  Imaging studies were reviewed by Dr.Mugweru.  Limited abdominal ultrasound yesterday evening revealed contracted gallbladder containing sludge versus hemorrhage.  Patient currently afebrile, BP 90/66, heart rate 102.  WBC normal, hemoglobin stable, creatinine 0.56, total bilirubin 1.3 down from 1.4.  Recommend holding on cholecystostomy tube replacement at this time unless patient's clinical status worsens.  If status worsens would first obtain CT abdomen pelvis with IV contrast for evaluation prior to possible gallbladder drain replacement.
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
## 2023-05-29 NOTE — Progress Notes (Signed)
Civil engineer, contracting Hospice liaison note     This patient is a current hospice patient with [facility].    Liaison will continue to follow for any discharge planning needs and to coordinate continuation of hospice care.    Please don't hesitate to call with any Hospice related questions or concerns.    Thank you for the opportunity to participate in this patient's care.  Glenna Fellows, BSN, RN, OCN [employer] [phone]

## 2023-05-29 NOTE — Plan of Care (Signed)
  Problem: Education: Goal: Knowledge of discharge needs will improve Outcome: Progressing   Problem: Clinical Measurements: Goal: Postoperative complications will be avoided or minimized Outcome: Progressing   Problem: Respiratory: Goal: Ability to achieve and maintain a regular respiratory rate will improve Outcome: Progressing

## 2023-05-30 LAB — CBC
HCT: 31.7 % — ABNORMAL LOW (ref 39.0–52.0)
Hemoglobin: 10.5 g/dL — ABNORMAL LOW (ref 13.0–17.0)
MCH: 31.4 pg (ref 26.0–34.0)
MCHC: 33.1 g/dL (ref 30.0–36.0)
MCV: 94.9 fL (ref 80.0–100.0)
Platelets: 266 10*3/uL (ref 150–400)
RBC: 3.34 MIL/uL — ABNORMAL LOW (ref 4.22–5.81)
RDW: 15.2 % (ref 11.5–15.5)
WBC: 6.6 10*3/uL (ref 4.0–10.5)
nRBC: 0 % (ref 0.0–0.2)

## 2023-05-30 LAB — BASIC METABOLIC PANEL
Anion gap: 7 (ref 5–15)
BUN: 16 mg/dL (ref 8–23)
CO2: 22 mmol/L (ref 22–32)
Calcium: 7.4 mg/dL — ABNORMAL LOW (ref 8.9–10.3)
Chloride: 97 mmol/L — ABNORMAL LOW (ref 98–111)
Creatinine, Ser: 0.65 mg/dL (ref 0.61–1.24)
GFR, Estimated: >60 mL/min (ref >60)
Glucose, Bld: 89 mg/dL (ref 70–99)
Potassium: 4.2 mmol/L (ref 3.5–5.1)
Sodium: 126 mmol/L — ABNORMAL LOW (ref 135–145)

## 2023-05-30 MED ORDER — SODIUM CHLORIDE 0.9 % IV SOLN: INTRAVENOUS | Status: AC
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
## 2023-05-30 NOTE — Plan of Care (Signed)
Problem: Education: Goal: Knowledge of discharge needs will improve Outcome: Progressing

## 2023-05-30 NOTE — Progress Notes (Signed)
Pt has not urinated all day, bladder scan results . Dr. Dayna Barker made aware

## 2023-05-30 NOTE — Plan of Care (Signed)
  Problem: Education: Goal: Knowledge of discharge needs will improve Outcome: Not Progressing   Problem: Clinical Measurements: Goal: Postoperative complications will be avoided or minimized Outcome: Not Progressing   Problem: Respiratory: Goal: Ability to achieve and maintain a regular respiratory rate will improve Outcome: Not Progressing   Problem: Skin Integrity: Goal: Demonstration of wound healing without infection will improve Outcome: Not Progressing   Problem: Fluid Volume: Goal: Hemodynamic stability will improve Outcome: Not Progressing   Problem: Clinical Measurements: Goal: Diagnostic test results will improve Outcome: Not Progressing Goal: Signs and symptoms of infection will decrease Outcome: Not Progressing   Problem: Respiratory: Goal: Ability to maintain adequate ventilation will improve Outcome: Not Progressing   Problem: Education: Goal: Knowledge of General Education information will improve Description: Including pain rating scale, medication(s)/side effects and non-pharmacologic comfort measures Outcome: Not Progressing   Problem: Health Behavior/Discharge Planning: Goal: Ability to manage health-related needs will improve Outcome: Not Progressing   Problem: Clinical Measurements: Goal: Ability to maintain clinical measurements within normal limits will improve Outcome: Not Progressing Goal: Will remain free from infection Outcome: Not Progressing Goal: Diagnostic test results will improve Outcome: Not Progressing Goal: Respiratory complications will improve Outcome: Not Progressing Goal: Cardiovascular complication will be avoided Outcome: Not Progressing   Problem: Activity: Goal: Risk for activity intolerance will decrease Outcome: Not Progressing   Problem: Nutrition: Goal: Adequate nutrition will be maintained Outcome: Not Progressing   Problem: Coping: Goal: Level of anxiety will decrease Outcome: Not Progressing   Problem:  Elimination: Goal: Will not experience complications related to bowel motility Outcome: Not Progressing Goal: Will not experience complications related to urinary retention Outcome: Not Progressing   Problem: Pain Managment: Goal: General experience of comfort will improve Outcome: Not Progressing   Problem: Safety: Goal: Ability to remain free from injury will improve Outcome: Not Progressing   Problem: Skin Integrity: Goal: Risk for impaired skin integrity will decrease Outcome: Not Progressing

## 2023-05-30 NOTE — Progress Notes (Signed)
Ryan Rose [address] [facility] Patient Visit  [name] is a current hospice patient, with hospice diagnosos of cerebrovascular disease unspecified, who was admitted to [facility] on 10.18.24 with biliary tube displacement. [facility] was notified of tube displacement and patient being sent to ED by facility. Per Dr. Gordy Savers, hospice physician, this is a related hospital admission.   Visited with patient in hospital. He is resting quietly, appears to be sleeping without signs of distress. Spoke with wife by phone to discuss visit and inpatient care.    Patient remains GIP appropriate due to need for close clinical observation of recent biliary tube removal and possible need for reinsertion if symptomatic as well as IV fluids.  Vital Signs: 98/91/17    98/74     100% on RA  I&O:  360/not documented  Abnormal labs: Na 126, Chl 97, Ca 7.4, HGB 10.5,  nasal swab positive for MRSA  Diagnostics: none new  IV/PRN Meds: NS at 99ml/hr, Tylenol 650mg  po x 1  Problem List:  Biliary drain displacement: given fairly benign clinical presentation, ultrasound abdomen no significant finding they decided to hold off on replacing cholecystostomy tube at this time.  If patient has any change in clinical status would consider CT abdomen pelvis with IV contrast before putting new cholecystostomy tube Will monitor him overnight   Soft blood pressure/hypotension: Holding in 90s.  Likely in the setting of liver cirrhosis.add midodrine 5 mg itd   Alcoholic liver cirrhosis with ascites Hypoalbuminemia Transaminitis: LFTs steadily improving.  Monitor ammonia INR and labs   Hyponatremia: Suspect in the setting of hypovolemia, liver cirrhosis.  Monitor    Dementia: Delirium precaution fall precaution   Discharge Planning: Ongoing  Family Contact: Spoke with wife by phone.   IDT: Updated  Goals of Care: DNR  Should patient need ambulance transfer  at discharge- please use [facility]) as they contract this service for our active hospice patients.  Glenna Fellows BSN, RN, [facility] hospital liaison [phone]

## 2023-05-31 LAB — CBC
HCT: 29.9 % — ABNORMAL LOW (ref 39.0–52.0)
Hemoglobin: 10.8 g/dL — ABNORMAL LOW (ref 13.0–17.0)
MCH: 32.1 pg (ref 26.0–34.0)
MCHC: 36.1 g/dL — ABNORMAL HIGH (ref 30.0–36.0)
MCV: 89 fL (ref 80.0–100.0)
Platelets: 262 10*3/uL (ref 150–400)
RBC: 3.36 MIL/uL — ABNORMAL LOW (ref 4.22–5.81)
RDW: 14.8 % (ref 11.5–15.5)
WBC: 6 10*3/uL (ref 4.0–10.5)
nRBC: 0 % (ref 0.0–0.2)

## 2023-05-31 LAB — BASIC METABOLIC PANEL
Anion gap: 6 (ref 5–15)
Anion gap: 9 (ref 5–15)
BUN: 13 mg/dL (ref 8–23)
BUN: 11 mg/dL (ref 8–23)
CO2: 23 mmol/L (ref 22–32)
CO2: 21 mmol/L — ABNORMAL LOW (ref 22–32)
Calcium: 7.2 mg/dL — ABNORMAL LOW (ref 8.9–10.3)
Calcium: 7.6 mg/dL — ABNORMAL LOW (ref 8.9–10.3)
Chloride: 97 mmol/L — ABNORMAL LOW (ref 98–111)
Chloride: 97 mmol/L — ABNORMAL LOW (ref 98–111)
Creatinine, Ser: 0.58 mg/dL — ABNORMAL LOW (ref 0.61–1.24)
Creatinine, Ser: 0.55 mg/dL — ABNORMAL LOW (ref 0.61–1.24)
GFR, Estimated: >60 mL/min (ref >60)
GFR, Estimated: >60 mL/min (ref >60)
Glucose, Bld: 96 mg/dL (ref 70–99)
Glucose, Bld: 98 mg/dL (ref 70–99)
Potassium: 3.7 mmol/L (ref 3.5–5.1)
Potassium: 4 mmol/L (ref 3.5–5.1)
Sodium: 126 mmol/L — ABNORMAL LOW (ref 135–145)
Sodium: 127 mmol/L — ABNORMAL LOW (ref 135–145)

## 2023-05-31 LAB — C DIFFICILE QUICK SCREEN W PCR REFLEX
C Diff antigen: NEGATIVE
C Diff interpretation: NOT DETECTED
C Diff toxin: NEGATIVE

## 2023-05-31 LAB — OSMOLALITY, URINE: Osmolality, Ur: 640 mosm/kg (ref 300–900)

## 2023-05-31 LAB — SODIUM, URINE, RANDOM: Sodium, Ur: <10 mmol/L

## 2023-05-31 MED ORDER — SODIUM CHLORIDE 0.9 % IV SOLN: INTRAVENOUS | Status: AC

## 2023-05-31 MED ORDER — OXYCODONE HCL 5 MG PO CAPS: 5.0000 mg | ORAL_CAPSULE | ORAL | 0 refills | Status: DC | PRN

## 2023-05-31 MED ORDER — ATIVAN 0.5 MG PO TABS: 0.5000 mg | ORAL_TABLET | ORAL | 0 refills | Status: DC

## 2023-05-31 NOTE — TOC Transition Note (Signed)
Transition of Care [facility]) - CM/SW Discharge Note   Patient Details  Name: Ryan Rose MRN: [id number] Date of Birth: 1961-06-01  Transition of Care [facility]) CM/SW Contact:  Harriett Sine, RN Phone Number:[phone]  05/31/2023, 4:18 PM   Clinical Narrative:     Pt from [facility] SNF with [facility]. Spoke with pt at bedside and wife [name] on phone about d/c plan and hospice. Family states pt will return to [facility] with [employer]. hospice [facility] they confirmed he can return they will get discharge summary. Called Misty with [facility] to confirm pt is still active with agency. Nurse to call report [phone] Transport arranged with [facility].   Called [name] with d/c update.  Final next level of care: Skilled Nursing Facility (with hospice [facility]) Barriers to Discharge: No Barriers Identified   Patient Goals and CMS Choice CMS Medicare.gov Compare Post Acute Care list provided to:: Patient (Wife [name]) Choice offered to / list presented to : Spouse  Discharge Placement                         Discharge Plan and Services Additional resources added to the After Visit Summary for       Post Acute Care Choice: NA          DME Arranged:  (none)         HH Arranged:  (NA)          Social Determinants of Health (SDOH) Interventions SDOH Screenings   Food Insecurity: Patient Unable To Answer (05/28/2023)  Housing: Patient Declined (05/28/2023)  Transportation Needs: Patient Unable To Answer (05/29/2023)  Utilities: Patient Unable To Answer (05/29/2023)  Tobacco Use: High Risk (05/28/2023)     Readmission Risk Interventions     No data to display

## 2023-05-31 NOTE — Consult Note (Signed)
WOC Nurse Consult Note: Reason for Consult: abdominal and pressure injuries Patient from SNF with dementia. History of biliary drains that have been pulled, unclear on timing. Hospice patient  Wound type: Stage 1 Pressure Injuries; bilateral heels Stage 1 Pressure Injury; sacrum Surgical/drain sites abdomen; yellow/scabbed  Pressure Injury POA: Yes Measurement: see nursing flow sheets  Wound bed: non blanchable redness for the heels and sacrum.  See above description for abdomen Drainage (amount, consistency, odor) none Periwound: intact  Dressing procedure/placement/frequency: Silicone foam dressings to the sacrum,            change every 3 days. ASSESS UNDER dressings each shift for any acute changes in the wounds.   Prevalon boots to offload bilateral heels, send with patient to SNF for use.   Re consult if needed, will not follow at this time. Thanks  Novi Calia [employer], RN,CWOCN, CNS, CWON-AP [phone])

## 2023-05-31 NOTE — Plan of Care (Signed)
  Problem: Education: Goal: Knowledge of discharge needs will improve Outcome: Adequate for Discharge   Problem: Clinical Measurements: Goal: Postoperative complications will be avoided or minimized Outcome: Adequate for Discharge   Problem: Respiratory: Goal: Ability to achieve and maintain a regular respiratory rate will improve Outcome: Adequate for Discharge   Problem: Skin Integrity: Goal: Demonstration of wound healing without infection will improve Outcome: Adequate for Discharge
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
## 2023-05-31 NOTE — Progress Notes (Signed)
[facility] arrived to transport patient back to [facility], patient alert and oriented to self, denies any pain/discomfort, IV removed, patient voiced no concerns at this time. Attempted to call [facility] to make them aware of patient leaving, no answer.

## 2023-05-31 NOTE — Plan of Care (Signed)
  Problem: Respiratory: Goal: Ability to achieve and maintain a regular respiratory rate will improve Outcome: Progressing   Problem: Pain Managment: Goal: General experience of comfort will improve Outcome: Progressing   Problem: Safety: Goal: Ability to remain free from injury will improve Outcome: Progressing

## 2023-05-31 NOTE — Progress Notes (Signed)
05/31/2023  1629  [facility] [phone] Left message on Admission line for a return call for report. Called multiple times the option 0 operator goes straight to voicemail, option 1 nursing director went to voicemail.

## 2023-05-31 NOTE — Progress Notes (Signed)
[name] Hospitalized Hospice Patient Visit   [name] is a current hospice patient, with terminal diagnosis of cerebrovascular disease unspecified, who was admitted to [facility] on 10.18.24 with biliary tube displacement. [facility] was notified of tube displacement and patient being sent to ED by facility. Per Dr. Gordy Savers, hospice physician, this is a related hospital admission.   Visited with patient at bedside. He is awake, alert and talkative. He is confused at times with events. He denies pain or discomfort, and at this time there is no plan to replace biliary tube.  Patient remains inpatient appropriate due to need for IV normal saline in treatment of hyponatremia.    Vital Signs:  97.9/80/17    117/88    99% room air I&O: 806/100 Abnormal labs: Na+ 126, Bun 0.58, Creatinine 7.2, Albumin 1.6, RBC 3.36, Hgb 10.8, Hct 29.9 Diagnostics:  None new  IV/PRN Meds: NS @ 138ml/H continuous  Problem list:   Biliary drain displacement: given fairly benign clinical presentation, ultrasound abdomen no significant finding IR decided to hold off on replacing cholecystostomy tube at this time.  If patient has any change in clinical status would consider CT abdomen pelvis with IV contrast before putting new cholecystostomy tube.  BP has been started likely baseline afebrile abdomen benign.  Will need outpatient follow-up  Soft blood pressure/hypotension: Overall holding ok.Patient has been placed on midodrine continue   Alcoholic liver cirrhosis with ascites Hypoalbuminemia Transaminitis: LFTs stable need outpatient follow-up.INR normal.  Hyponatremia: Suspect in the setting of hypovolemia, liver cirrhosis.  Sodium further dropping likely from poor oral intake, keeping a gentle eye fluid hydration once sodium improving close to 130 we Rose discharge. Encourage po  Prolonged Qtc: Monitor electrolytes and avoid QT prolonging medication   Dementia: Delirium precaution fall precaution  Discharge Planning:  DC back to [facility] likely tomorrow once Sodium stabilized Family contact:  Talked with wife [name] by phone. IDT:  Updated   Goals of Care- DNR  Should patient need ambulance transfer at discharge- please use [facility]) as they contract this service for our active hospice patients. Thea Gist BSN RN Hospice hospital liaison [phone]

## 2023-06-01 ENCOUNTER — Ambulatory Visit: Payer: Commercial Managed Care - PPO | Admitting: Gastroenterology

## 2023-06-21 ENCOUNTER — Other Ambulatory Visit: Payer: Self-pay | Admitting: Student

## 2023-06-21 ENCOUNTER — Other Ambulatory Visit (HOSPITAL_COMMUNITY): Payer: Self-pay

## 2023-06-22 ENCOUNTER — Ambulatory Visit (HOSPITAL_COMMUNITY)
Admission: RE | Admit: 2023-06-22 | Discharge: 2023-06-22 | Disposition: A | Discharge status: Home / Self Care | Payer: Medicaid Other | Source: Ambulatory Visit | Attending: Student | Admitting: Student

## 2023-06-22 ENCOUNTER — Other Ambulatory Visit (HOSPITAL_COMMUNITY): Payer: Self-pay | Admitting: Student

## 2023-06-22 DIAGNOSIS — K810 Acute cholecystitis: Secondary | ICD-10-CM | POA: Insufficient documentation

## 2023-06-22 DIAGNOSIS — Z538 Procedure and treatment not carried out for other reasons: Secondary | ICD-10-CM | POA: Insufficient documentation

## 2023-06-22 HISTORY — PX: IR PATIENT EVAL TECH 0-60 MINS: IMG5564

## 2023-06-22 MED ORDER — LIDOCAINE-EPINEPHRINE 1 %-1:100000 IJ SOLN
INTRAMUSCULAR | Status: AC
  Filled 2023-06-22: qty 1

## 2023-06-22 NOTE — Progress Notes (Signed)
Patient arrived to IR department for routine catheter exchange per prior note. Patient  not consentable, PAs acquired consent through patient's wife. Proceeded to get patient on exam table, staff noticed no catheter was present. Displaced Oct 21, per chart from [facility]. Notified physician, PAs, as well as nursing home staff that accompanied [name]. PA to contact patient's wife with update, patient transferred back to facility.

## 2023-06-24 ENCOUNTER — Emergency Department (HOSPITAL_COMMUNITY)
Admission: EM | Admit: 2023-06-24 | Discharge: 2023-06-25 | Disposition: A | Discharge status: Home / Self Care | Payer: MEDICAID

## 2023-06-24 ENCOUNTER — Encounter (HOSPITAL_COMMUNITY): Payer: Self-pay

## 2023-06-24 ENCOUNTER — Emergency Department (HOSPITAL_COMMUNITY): Payer: MEDICAID

## 2023-06-24 DIAGNOSIS — Z66 Do not resuscitate: Secondary | ICD-10-CM | POA: Diagnosis not present

## 2023-06-24 DIAGNOSIS — Z043 Encounter for examination and observation following other accident: Secondary | ICD-10-CM | POA: Diagnosis present

## 2023-06-24 DIAGNOSIS — F0390 Unspecified dementia without behavioral disturbance: Secondary | ICD-10-CM | POA: Insufficient documentation

## 2023-06-24 DIAGNOSIS — W19XXXA Unspecified fall, initial encounter: Secondary | ICD-10-CM

## 2023-06-24 HISTORY — PX: PR EXCHANGE BILIARY DRG CATHETER PRQ W/IMG GID RS&I: 47536

## 2023-06-24 NOTE — Procedures (Signed)
Patient arrived to IR department for routine catheter exchange per prior note. Patient  not consentable, PAs acquired consent through patient's wife. Proceeded to get patient on exam table, staff noticed no catheter was present. Displaced Oct 21, per chart from [facility]. Notified physician, PAs, as well as nursing home staff that accompanied [name]. PA to contact patient's wife with update, patient transferred back to facility.

## 2023-06-24 NOTE — ED Triage Notes (Signed)
Pt arrived via EMS from [facility], for mechanical fall, unwitnessed, no pain, tachycardiac, alert to self at baseline does follow commands. Comfort measures only, has DNR.

## 2023-06-25 NOTE — ED Provider Notes (Signed)
I assumed care of this patient from previous provider.  Please see their note for further details of history, exam, and MDM.   Briefly patient is a 62 y.o. male who presented after a fall at the facility.  At baseline patient has dementia.  Currently awaiting CT head and cervical spine.  Imaging negative for any acute injuries.  On reevaluation, patient is well-appearing, has no physical complaints.  The patient appears reasonably screened and/or stabilized for discharge and I doubt any other medical condition or other [facility] requiring further screening, evaluation, or treatment in the ED at this time. I have discussed the findings, Dx and Tx plan with the patient/family who expressed understanding and agree(s) with the plan. Discharge instructions discussed at length. The patient/family was given strict return precautions who verbalized understanding of the instructions. No further questions at time of discharge.  Disposition: Discharge  Condition: Good  ED Discharge Orders     None        Follow Up: Sherron Monday, MD [address] [place] [address] [phone]  Call  as needed         Nira Conn, MD 06/25/23 [phone]

## 2023-06-25 NOTE — ED Provider Notes (Signed)
[place] EMERGENCY DEPARTMENT AT [facility] Provider Note   CSN: [id number] Arrival date & time: 06/24/23  2201     History  Chief Complaint  Patient presents with   Ryan Rose is a 62 y.o. male.  Presents from facility with mechanical fall.  At baseline mentation per reports.  Denies pain at this time.   Fall       Home Medications Prior to Admission medications   Medication Sig Start Date End Date Taking? Authorizing Provider  acetaminophen (TYLENOL) 325 MG tablet Take 2 tablets (650 mg total) by mouth every 6 (six) hours as needed for mild pain (or Fever >/= 101). 05/02/23   Sheikh, Omair Latif, DO  ATIVAN 0.5 MG tablet Take 1 tablet (0.5 mg total) by mouth See admin instructions. Take 0.5 mg by mouth at 12 MIDNIGHT, 6 AM, 12 NOON, and 6 PM 05/31/23   Kc, Dayna Barker, MD  donepezil (ARICEPT) 5 MG tablet Take 5 mg by mouth at bedtime.    [provider]  melatonin 3 MG TABS tablet Take 3 mg by mouth at bedtime.    [provider]  nicotine (NICODERM CQ - DOSED IN MG/24 HOURS) 14 mg/24hr patch Place 1 patch (14 mg total) onto the skin daily. 04/24/23   [name], MD  oxycodone (OXY-IR) 5 MG capsule Take 1 capsule (5 mg total) by mouth every 4 (four) hours as needed (for moderate to severe pain). 05/31/23   Lanae Boast, MD  venlafaxine (EFFEXOR) 37.5 MG tablet Take 37.5 mg by mouth daily.    [provider]      Allergies    Sulfa antibiotics    Review of Systems   Review of Systems  Physical Exam Updated Vital Signs BP (!) 102/90   Pulse 74   Resp 12   SpO2 95%  Physical Exam Vitals and nursing note reviewed.  Constitutional:      Appearance: He is not toxic-appearing.  HENT:     Head: Normocephalic.  Eyes:     Conjunctiva/sclera: Conjunctivae normal.  Cardiovascular:     Rate and Rhythm: Normal rate and regular rhythm.  Pulmonary:     Effort: Pulmonary effort is normal.     Breath sounds: Normal  breath sounds.  Abdominal:     General: Abdomen is flat. There is no distension.     Palpations: Abdomen is soft.     Tenderness: There is no abdominal tenderness. There is no guarding or rebound.  Musculoskeletal:        General: No tenderness.  Skin:    General: Skin is warm and dry.     Capillary Refill: Capillary refill takes less than 2 seconds.  Neurological:     Mental Status: He is alert. Mental status is at baseline.     ED Results / Procedures / Treatments   Labs (all labs ordered are listed, but only abnormal results are displayed) Labs Reviewed - No data to display  EKG None  Radiology No results found.  Procedures Procedures    Medications Ordered in ED Medications - No data to display  ED Course/ Medical Decision Making/ A&P                                 Medical Decision Making Well-appearing 62 year old male present emergency department for fall.  No overt signs of trauma on exam.  At baseline  mentation per EMS reports. He is DNR/Comfort care. Will get CT imaging to rule out acute intracranial pathology and CT scan of neck unclear mechanism.  Care signed out to overnight team.  Disposition pending final CT scan.  Anticipate discharge.  Amount and/or Complexity of Data Reviewed External Data Reviewed:     Details: No blood thinner per chart review.  Radiology: ordered.          Final Clinical Impression(s) / ED Diagnoses Final diagnoses:  None    Rx / DC Orders ED Discharge Orders     None         Coral Spikes, DO 06/25/23 0001

## 2023-07-13 ENCOUNTER — Ambulatory Visit (INDEPENDENT_AMBULATORY_CARE_PROVIDER_SITE_OTHER): Payer: Medicare Other | Admitting: Surgery

## 2023-07-13 ENCOUNTER — Encounter: Payer: Self-pay | Admitting: Surgery

## 2023-07-13 VITALS — BP 119/89 | HR 106 | Ht 68.5 in | Wt 118.0 lb

## 2023-07-13 DIAGNOSIS — Z434 Encounter for attention to other artificial openings of digestive tract: Secondary | ICD-10-CM

## 2023-07-13 DIAGNOSIS — T85520A Displacement of bile duct prosthesis, initial encounter: Secondary | ICD-10-CM

## 2023-07-13 DIAGNOSIS — K810 Acute cholecystitis: Secondary | ICD-10-CM
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
## 2023-07-13 NOTE — Patient Instructions (Signed)
No need for a new biliary drain.  Return to the ER if your symptoms return.    Follow-up with our office as needed.  Please call and ask to speak with a nurse if you develop questions or concerns.

## 2023-07-16 ENCOUNTER — Other Ambulatory Visit: Payer: Self-pay

## 2023-07-22 ENCOUNTER — Other Ambulatory Visit: Payer: Self-pay

## 2023-07-22 ENCOUNTER — Ambulatory Visit (INDEPENDENT_AMBULATORY_CARE_PROVIDER_SITE_OTHER): Payer: Medicaid Other | Admitting: Gastroenterology

## 2023-07-22 ENCOUNTER — Encounter: Payer: Self-pay | Admitting: Gastroenterology

## 2023-07-22 VITALS — BP 128/78 | HR 92 | Temp 97.8°F

## 2023-07-22 DIAGNOSIS — F1090 Alcohol use, unspecified, uncomplicated: Secondary | ICD-10-CM

## 2023-07-22 DIAGNOSIS — E781 Pure hyperglyceridemia: Secondary | ICD-10-CM

## 2023-07-22 DIAGNOSIS — K209 Esophagitis, unspecified without bleeding: Secondary | ICD-10-CM

## 2023-07-22 DIAGNOSIS — K7460 Unspecified cirrhosis of liver: Secondary | ICD-10-CM

## 2023-07-22 DIAGNOSIS — K2210 Ulcer of esophagus without bleeding: Secondary | ICD-10-CM

## 2023-07-22 DIAGNOSIS — D649 Anemia, unspecified: Secondary | ICD-10-CM

## 2023-07-22 MED ORDER — OMEPRAZOLE 40 MG PO CPDR: 40.0000 mg | DELAYED_RELEASE_CAPSULE | Freq: Two times a day (BID) | ORAL | 3 refills | Status: DC

## 2023-07-22 NOTE — Progress Notes (Signed)
Arlyss Repress, MD [address]  [address]  [place], [place] [address]  Main: [phone]  Fax: [phone]    Gastroenterology Consultation  Referring Provider:     Sherron Monday, MD Primary Care Physician:  Sherron Monday, MD Primary Gastroenterologist:  Dr. Arlyss Repress Reason for Consultation: Erosive esophagitis, ?  Cirrhosis        HPI:   Ryan Rose is a 62 y.o. male referred by Dr. Sherron Monday, MD  for consultation & management of erosive esophagitis.  Patient was admitted to [facility] in 03/2023 secondary to acute upper GI bleed, underwent upper endoscopy which revealed erosive esophagitis, large hiatal hernia as well as AVM in the stomach which was treated with.  He has imaging evidence of cirrhosis of liver, AAA repair, COPD, alcohol use.  History of chronic calculus cholecystitis, status post cholecystostomy tube, with drain placement on 04/02/2023, displaced on May 31, 2023 Patient is brought by caregiver from group home.  He is in wheelchair, minimally verbal.  But his caregiver, he was not having any discomfort while being fed.  He eats by mouth.  Has not been taking omeprazole, not on his medication list sent with him today  NSAIDs: None  Antiplts/Anticoagulants/Anti thrombotics: None  GI Procedures:  Upper endoscopy 03/25/2023 - LA Grade D esophagitis with no bleeding. - Large hiatal hernia. - A single non- bleeding angioectasia in the stomach. Treated with argon plasma coagulation ( APC) . - Normal examined duodenum. - No specimens collected.  Past Medical History:  Diagnosis Date   Anemia    Aortic atherosclerosis (HCC)    Arthritis    Bilateral carpal tunnel syndrome    Coronary artery disease    Diastolic dysfunction    a.) TTE 08/24/2022: EF 60-65%, mild-mod MR, G1DD.   ETOH abuse    Hepatic steatosis    History of methicillin resistant staphylococcus aureus (MRSA)    HLD (hyperlipidemia)    HTN (hypertension)     Infrarenal abdominal aortic aneurysm (AAA) without rupture (HCC) 05/28/2022   a.) CT AP 05/28/2022: saccular thrombosed; measures 4.4 x 3.2 cm   Nausea & vomiting 03/21/2023   PVD (peripheral vascular disease) with claudication (HCC)    Sigmoid diverticulosis    Transaminitis     Past Surgical History:  Procedure Laterality Date   CERVICAL LAMINOPLASTY  08/20/2021   right C3-C6   COLONOSCOPY     ENDOVASCULAR REPAIR/STENT GRAFT N/A 09/09/2022   Procedure: ENDOVASCULAR REPAIR/STENT GRAFT;  Surgeon: Renford Dills, MD;  Location: [facility] INVASIVE CV LAB;  Service: Cardiovascular;  Laterality: N/A;   ESOPHAGOGASTRODUODENOSCOPY N/A 03/25/2023   Procedure: ESOPHAGOGASTRODUODENOSCOPY (EGD);  Surgeon: Wyline Mood, MD;  Location: [facility] ENDOSCOPY;  Service: Gastroenterology;  Laterality: N/A;   HOT HEMOSTASIS  03/25/2023   Procedure: HOT HEMOSTASIS (ARGON PLASMA COAGULATION/BICAP);  Surgeon: Wyline Mood, MD;  Location: [facility] ENDOSCOPY;  Service: Gastroenterology;;   IR EXCHANGE BILIARY DRAIN  05/10/2023   IR PATIENT EVAL TECH 0-60 MINS  06/22/2023   IR PERC CHOLECYSTOSTOMY  04/02/2023   PR EXCHANGE BILIARY DRG CATHETER PRQ W/IMG GID RS&I  06/24/2023   SKIN GRAFT     to hand and forearm     Current Outpatient Medications:    acetaminophen (TYLENOL) 325 MG tablet, Take 2 tablets (650 mg total) by mouth every 6 (six) hours as needed for mild pain (or Fever >/= 101)., Disp: 20 tablet, Rfl: 0   ATIVAN 0.5 MG tablet, Take 1 tablet (0.5  mg total) by mouth See admin instructions. Take 0.5 mg by mouth at 12 MIDNIGHT, 6 AM, 12 NOON, and 6 PM, Disp: 5 tablet, Rfl: 0   donepezil (ARICEPT) 5 MG tablet, Take 5 mg by mouth at bedtime., Disp: , Rfl:    melatonin 3 MG TABS tablet, Take 3 mg by mouth at bedtime., Disp: , Rfl:    omeprazole (PRILOSEC) 40 MG capsule, Take 1 capsule (40 mg total) by mouth 2 (two) times daily before a meal., Disp: 180 capsule, Rfl: 3   oxycodone (OXY-IR) 5 MG capsule, Take 1 capsule (5  mg total) by mouth every 4 (four) hours as needed (for moderate to severe pain)., Disp: 5 capsule, Rfl: 0   venlafaxine (EFFEXOR) 37.5 MG tablet, Take 37.5 mg by mouth daily., Disp: , Rfl:    nicotine (NICODERM CQ - DOSED IN MG/24 HOURS) 14 mg/24hr patch, Place 1 patch (14 mg total) onto the skin daily. (Patient not taking: Reported on 07/22/2023), Disp: , Rfl:    Family History  Problem Relation Age of Onset   Heart attack Mother      Social History   Tobacco Use   Smoking status: Some Days    Current packs/day: 0.10    Average packs/day: 0.1 packs/day for 45.0 years (4.5 ttl pk-yrs)    Types: Cigarettes   Smokeless tobacco: Never   Tobacco comments:    Smokes 5 cigarettes weekly  Vaping Use   Vaping status: Never Used  Substance Use Topics   Alcohol use: Yes    Comment: occasional   Drug use: Yes    Types: Marijuana    Comment: occasionally    Allergies as of 07/22/2023 - Review Complete 07/22/2023  Allergen Reaction Noted   Sulfa antibiotics Hives 11/25/2014    Review of Systems:    All systems reviewed and negative except where noted in HPI.   Physical Exam:  BP 128/78 (BP Location: Right Arm, Patient Position: Sitting, Cuff Size: Normal)   Pulse 92   Temp 97.8 F (36.6 C) (Oral)  No LMP for male patient.  General: Ill-appearing, alert, poorly nourished, NAD Head:  Normocephalic and atraumatic, bitemporal wasting. Eyes:  Sclera clear, no icterus.   Conjunctiva pink. Ears:  Normal auditory acuity. Nose:  No deformity, discharge, or lesions. Mouth:  No deformity or lesions,oropharynx pink & moist. Neck:  Supple; no masses or thyromegaly. Lungs:  Respirations even and unlabored.  Clear throughout to auscultation.   No wheezes, crackles, or rhonchi. No acute distress. Heart:  Regular rate and rhythm; no murmurs, clicks, rubs, or gallops. Abdomen:  Normal bowel sounds. Soft, non-tender and non-distended without masses, hepatosplenomegaly or hernias noted.  No  guarding or rebound tenderness.   Rectal: Not performed Msk:  Symmetrical without gross deformities.  Wasting Pulses:  Normal pulses noted. Extremities:  No clubbing or edema.  No cyanosis. Neurologic:  Alert and oriented x1 Psych:  Alert and cooperative. Normal mood and affect.  Imaging Studies: Reviewed  Assessment and Plan:   Ryan Rose is a 62 y.o. male with history of AAA repair, COPD, alcohol use, erosive esophagitis is functionally dependent, group home resident  Erosive esophagitis Recommend omeprazole 40 mg p.o. twice daily before meals lifelong Recommend EGD to confirm healing of erosive esophagitis and rule out any underlying Barrett's esophagus Continue antireflux lifestyle  ?  Cirrhosis of liver based on ultrasound in 05/2023 No evidence of splenomegaly, portal hypertension or thrombocytopenia Hepatitis A, B and C serologies are negative  Normocytic  anemia Check CBC  Hyponatremia Check BMP  Follow up as needed   Arlyss Repress, MD

## 2023-07-26 ENCOUNTER — Encounter: Payer: Self-pay | Admitting: Gastroenterology

## 2023-07-27 ENCOUNTER — Encounter
Admission: RE | Disposition: A | Discharge status: Home / Self Care | Payer: Self-pay | Source: Home / Self Care | Attending: Gastroenterology

## 2023-07-27 ENCOUNTER — Ambulatory Visit: Payer: Medicaid Other | Admitting: Certified Registered"

## 2023-07-27 ENCOUNTER — Ambulatory Visit
Admission: RE | Admit: 2023-07-27 | Discharge: 2023-07-27 | Disposition: A | Discharge status: Home / Self Care | Payer: Medicaid Other | Attending: Gastroenterology | Admitting: Gastroenterology

## 2023-07-27 ENCOUNTER — Encounter: Payer: Self-pay | Admitting: Gastroenterology

## 2023-07-27 ENCOUNTER — Other Ambulatory Visit: Payer: Self-pay

## 2023-07-27 DIAGNOSIS — Z09 Encounter for follow-up examination after completed treatment for conditions other than malignant neoplasm: Secondary | ICD-10-CM | POA: Insufficient documentation

## 2023-07-27 DIAGNOSIS — K31819 Angiodysplasia of stomach and duodenum without bleeding: Secondary | ICD-10-CM | POA: Diagnosis not present

## 2023-07-27 DIAGNOSIS — F17210 Nicotine dependence, cigarettes, uncomplicated: Secondary | ICD-10-CM | POA: Insufficient documentation

## 2023-07-27 DIAGNOSIS — I739 Peripheral vascular disease, unspecified: Secondary | ICD-10-CM | POA: Diagnosis not present

## 2023-07-27 DIAGNOSIS — I10 Essential (primary) hypertension: Secondary | ICD-10-CM | POA: Insufficient documentation

## 2023-07-27 DIAGNOSIS — Z8249 Family history of ischemic heart disease and other diseases of the circulatory system: Secondary | ICD-10-CM | POA: Diagnosis not present

## 2023-07-27 DIAGNOSIS — I2510 Atherosclerotic heart disease of native coronary artery without angina pectoris: Secondary | ICD-10-CM | POA: Diagnosis not present

## 2023-07-27 DIAGNOSIS — K2100 Gastro-esophageal reflux disease with esophagitis, without bleeding: Secondary | ICD-10-CM | POA: Diagnosis not present

## 2023-07-27 DIAGNOSIS — K2210 Ulcer of esophagus without bleeding: Secondary | ICD-10-CM

## 2023-07-27 HISTORY — PX: ESOPHAGOGASTRODUODENOSCOPY (EGD) WITH PROPOFOL: SHX5813

## 2023-07-27 HISTORY — PX: HOT HEMOSTASIS: SHX5433

## 2023-07-27 SURGERY — ESOPHAGOGASTRODUODENOSCOPY (EGD) WITH PROPOFOL
Anesthesia: General

## 2023-07-27 MED ORDER — LIDOCAINE HCL (CARDIAC) PF 100 MG/5ML IV SOSY
PREFILLED_SYRINGE | INTRAVENOUS | Status: DC | PRN
  Administered 2023-07-27: 100 mg via INTRAVENOUS

## 2023-07-27 MED ORDER — DEXMEDETOMIDINE HCL IN NACL 80 MCG/20ML IV SOLN
INTRAVENOUS | Status: DC | PRN
  Administered 2023-07-27: 4 ug via INTRAVENOUS
  Administered 2023-07-27: 8 ug via INTRAVENOUS

## 2023-07-27 MED ORDER — LIDOCAINE HCL (PF) 2 % IJ SOLN
INTRAMUSCULAR | Status: AC
  Filled 2023-07-27: qty 5

## 2023-07-27 MED ORDER — SODIUM CHLORIDE 0.9 % IV SOLN: INTRAVENOUS | Status: DC

## 2023-07-27 MED ORDER — PROPOFOL 10 MG/ML IV BOLUS
INTRAVENOUS | Status: DC | PRN
  Administered 2023-07-27: 10 mg via INTRAVENOUS
  Administered 2023-07-27: 30 mg via INTRAVENOUS
  Administered 2023-07-27 (×2): 10 mg via INTRAVENOUS

## 2023-07-27 MED ORDER — PROPOFOL 10 MG/ML IV BOLUS
INTRAVENOUS | Status: AC
  Filled 2023-07-27: qty 40

## 2023-07-27 MED ORDER — PHENYLEPHRINE 80 MCG/ML (10ML) SYRINGE FOR IV PUSH (FOR BLOOD PRESSURE SUPPORT)
PREFILLED_SYRINGE | INTRAVENOUS | Status: DC | PRN
  Administered 2023-07-27 (×2): 80 ug via INTRAVENOUS

## 2023-07-27 MED ORDER — GLYCOPYRROLATE 0.2 MG/ML IJ SOLN
INTRAMUSCULAR | Status: AC
  Filled 2023-07-27: qty 1

## 2023-07-27 MED ORDER — PHENYLEPHRINE 80 MCG/ML (10ML) SYRINGE FOR IV PUSH (FOR BLOOD PRESSURE SUPPORT)
PREFILLED_SYRINGE | INTRAVENOUS | Status: AC
  Filled 2023-07-27: qty 10

## 2023-07-27 MED ORDER — PROPOFOL 10 MG/ML IV BOLUS
INTRAVENOUS | Status: AC
  Filled 2023-07-27: qty 20

## 2023-07-27 NOTE — H&P (Signed)
Arlyss Repress, MD [address]  [address]  [place], [place] [address]  Main: [phone]  Fax: [phone] Pager: [phone]  Primary Care Physician:  Sherron Monday, MD Primary Gastroenterologist:  Dr. Arlyss Repress  Pre-Procedure History & Physical: HPI:  Ryan Rose is a 62 y.o. male is here for an endoscopy.   Past Medical History:  Diagnosis Date   Acute acalculous cholecystitis 04/02/2023   Acute blood loss anemia 03/26/2023   Alcohol withdrawal (HCC) 03/21/2023   Alcoholic ketoacidosis 03/22/2023   Anemia    Aortic atherosclerosis (HCC)    Arthritis    Aspiration pneumonia (HCC) 03/28/2023   Bilateral carpal tunnel syndrome    Biliary drain displacement, initial encounter 05/28/2023   Coronary artery disease    Diastolic dysfunction    a.) TTE 08/24/2022: EF 60-65%, mild-mod MR, G1DD.   ETOH abuse    Hepatic steatosis    History of methicillin resistant staphylococcus aureus (MRSA)    HLD (hyperlipidemia)    HTN (hypertension)    Infrarenal abdominal aortic aneurysm (AAA) without rupture (HCC) 05/28/2022   a.) CT AP 05/28/2022: saccular thrombosed; measures 4.4 x 3.2 cm   Nausea & vomiting 03/21/2023   PVD (peripheral vascular disease) with claudication (HCC)    Sigmoid diverticulitis 05/28/2022   Sigmoid diverticulosis    Transaminitis     Past Surgical History:  Procedure Laterality Date   CERVICAL LAMINOPLASTY  08/20/2021   right C3-C6   COLONOSCOPY     ENDOVASCULAR REPAIR/STENT GRAFT N/A 09/09/2022   Procedure: ENDOVASCULAR REPAIR/STENT GRAFT;  Surgeon: Renford Dills, MD;  Location: [facility] INVASIVE CV LAB;  Service: Cardiovascular;  Laterality: N/A;   ESOPHAGOGASTRODUODENOSCOPY N/A 03/25/2023   Procedure: ESOPHAGOGASTRODUODENOSCOPY (EGD);  Surgeon: Wyline Mood, MD;  Location: [facility] ENDOSCOPY;  Service: Gastroenterology;  Laterality: N/A;   HOT HEMOSTASIS  03/25/2023   Procedure: HOT HEMOSTASIS (ARGON PLASMA COAGULATION/BICAP);   Surgeon: Wyline Mood, MD;  Location: [facility] ENDOSCOPY;  Service: Gastroenterology;;   IR EXCHANGE BILIARY DRAIN  05/10/2023   IR PATIENT EVAL TECH 0-60 MINS  06/22/2023   IR PERC CHOLECYSTOSTOMY  04/02/2023   PR EXCHANGE BILIARY DRG CATHETER PRQ W/IMG GID RS&I  06/24/2023   SKIN GRAFT     to hand and forearm    Prior to Admission medications   Medication Sig Start Date End Date Taking? Authorizing Provider  ATIVAN 0.5 MG tablet Take 1 tablet (0.5 mg total) by mouth See admin instructions. Take 0.5 mg by mouth at 12 MIDNIGHT, 6 AM, 12 NOON, and 6 PM 05/31/23  Yes Kc, Ramesh, MD  donepezil (ARICEPT) 5 MG tablet Take 5 mg by mouth at bedtime.   Yes [provider]  omeprazole (PRILOSEC) 40 MG capsule Take 1 capsule (40 mg total) by mouth 2 (two) times daily before a meal. 07/22/23  Yes Stanislawa Gaffin, Loel Dubonnet, MD  oxycodone (OXY-IR) 5 MG capsule Take 1 capsule (5 mg total) by mouth every 4 (four) hours as needed (for moderate to severe pain). 05/31/23  Yes Lanae Boast, MD  venlafaxine (EFFEXOR) 37.5 MG tablet Take 37.5 mg by mouth daily.   Yes [provider]  acetaminophen (TYLENOL) 325 MG tablet Take 2 tablets (650 mg total) by mouth every 6 (six) hours as needed for mild pain (or Fever >/= 101). 05/02/23   Sheikh, Omair Latif, DO  melatonin 3 MG TABS tablet Take 3 mg by mouth at bedtime.    [provider]  nicotine (NICODERM CQ - DOSED IN  MG/24 HOURS) 14 mg/24hr patch Place 1 patch (14 mg total) onto the skin daily. Patient not taking: Reported on 07/22/2023 04/24/23   [name], MD    Allergies as of 07/22/2023 - Review Complete 07/22/2023  Allergen Reaction Noted   Sulfa antibiotics Hives 11/25/2014    Family History  Problem Relation Age of Onset   Heart attack Mother     Social History   Socioeconomic History   Marital status: Married    Spouse name: [name]   Number of children: Not on file   Years of education: Not on file   Highest education  level: Not on file  Occupational History   Not on file  Tobacco Use   Smoking status: Some Days    Current packs/day: 0.10    Average packs/day: 0.1 packs/day for 45.0 years (4.5 ttl pk-yrs)    Types: Cigarettes   Smokeless tobacco: Never   Tobacco comments:    Smokes 5 cigarettes weekly  Vaping Use   Vaping status: Never Used  Substance and Sexual Activity   Alcohol use: Yes    Comment: occasional   Drug use: Yes    Types: Marijuana    Comment: occasionally   Sexual activity: Not on file  Other Topics Concern   Not on file  Social History Narrative   Not on file   Social Drivers of Health   Financial Resource Strain: Not on file  Food Insecurity: Patient Unable To Answer (05/28/2023)   Hunger Vital Sign    Worried About Running Out of Food in the Last Year: Patient unable to answer    Ran Out of Food in the Last Year: Patient unable to answer  Transportation Needs: Patient Unable To Answer (05/29/2023)   PRAPARE - Transportation    Lack of Transportation (Medical): Patient unable to answer    Lack of Transportation (Non-Medical): Patient unable to answer  Physical Activity: Not on file  Stress: Not on file  Social Connections: Not on file  Intimate Partner Violence: Patient Unable To Answer (05/29/2023)   Humiliation, Afraid, Rape, and Kick questionnaire    Fear of Current or Ex-Partner: Patient unable to answer    Emotionally Abused: Patient unable to answer    Physically Abused: Patient unable to answer    Sexually Abused: Patient unable to answer    Review of Systems: See HPI, otherwise negative ROS  Physical Exam: BP (!) 111/94   Pulse 98   Temp (!) 96.2 F (35.7 C) (Temporal)   Resp 18   Ht 5' 8.5" (1.74 m)   Wt 52.2 kg   SpO2 90%   BMI 17.23 kg/m  General:   Alert,  pleasant and cooperative in NAD Head:  Normocephalic and atraumatic. Neck:  Supple; no masses or thyromegaly. Lungs:  Clear throughout to auscultation.    Heart:  Regular rate and  rhythm. Abdomen:  Soft, nontender and nondistended. Normal bowel sounds, without guarding, and without rebound.   Neurologic:  Alert and  oriented x4;  grossly normal neurologically.  Impression/Plan: Ryan Rose is here for an endoscopy to be performed for Erosive esophagitis   Risks, benefits, limitations, and alternatives regarding  endoscopy have been reviewed with the patient.  Questions have been answered.  All parties agreeable.   Lannette Donath, MD  07/27/2023, 9:53 AM

## 2023-07-27 NOTE — Op Note (Signed)
[facility] Gastroenterology Patient Name: Ryan Rose Procedure Date: 07/27/2023 9:58 AM MRN: [id number] Account #: [id number] Date of Birth: [birth year] Admit Type: Outpatient Age: 62 Room: [facility] ENDO ROOM 3 Gender: Male Note Status: Finalized Instrument Name: Upper Endoscope [id number] Procedure:             Upper GI endoscopy Indications:           Follow-up of esophagitis Providers:             Toney Reil MD, MD Referring MD:          Silas Flood. Ellsworth Lennox, MD (Referring MD) Medicines:             General Anesthesia Complications:         No immediate complications. Estimated blood loss: None. Procedure:             Pre-Anesthesia Assessment:                        - Prior to the procedure, a History and Physical was                         performed, and patient medications and allergies were                         reviewed. The patient is competent. The risks and                         benefits of the procedure and the sedation options and                         risks were discussed with the patient. All questions                         were answered and informed consent was obtained.                         Patient identification and proposed procedure were                         verified by the physician, the nurse, the                         anesthesiologist, the anesthetist and the technician                         in the pre-procedure area in the procedure room in the                         endoscopy suite. Mental Status Examination: alert and                         oriented. Airway Examination: normal oropharyngeal                         airway and neck mobility. Respiratory Examination:                         clear to auscultation. CV Examination: normal.  Prophylactic Antibiotics: The patient does not require                         prophylactic antibiotics. Prior Anticoagulants: The                          patient has taken no anticoagulant or antiplatelet                         agents. ASA Grade Assessment: III - A patient with                         severe systemic disease. After reviewing the risks and                         benefits, the patient was deemed in satisfactory                         condition to undergo the procedure. The anesthesia                         plan was to use general anesthesia. Immediately prior                         to administration of medications, the patient was                         re-assessed for adequacy to receive sedatives. The                         heart rate, respiratory rate, oxygen saturations,                         blood pressure, adequacy of pulmonary ventilation, and                         response to care were monitored throughout the                         procedure. The physical status of the patient was                         re-assessed after the procedure.                        After obtaining informed consent, the endoscope was                         passed under direct vision. Throughout the procedure,                         the patient's blood pressure, pulse, and oxygen                         saturations were monitored continuously. The Endoscope                         was introduced through the mouth, and advanced to the  second part of duodenum. The upper GI endoscopy was                         accomplished without difficulty. The patient tolerated                         the procedure well. Findings:      Multiple medium angioectasias without bleeding were found in the second       portion of the duodenum. Coagulation for hemostasis using argon plasma       was successful. Estimated blood loss: none.      The entire examined stomach was normal.      The cardia and gastric fundus were normal on retroflexion.      LA Grade C (one or more mucosal breaks continuous between tops of 2 or        more mucosal folds, less than 75% circumference) esophagitis with no       bleeding was found in the lower third of the esophagus. Impression:            - Multiple non-bleeding angioectasias in the duodenum.                         Treated with argon plasma coagulation (APC).                        - Normal stomach.                        - LA Grade C reflux esophagitis with no bleeding.                        - No specimens collected. Recommendation:        - Discharge patient to a nursing home (with escort).                        - Resume previous diet today.                        - Continue present medications.                        - Follow an antireflux regimen indefinitely.                        - Use Prilosec (omeprazole) 40 mg PO BID for the rest                         of the patient's life. Procedure Code(s):     --- Professional ---                        [phone], Esophagogastroduodenoscopy, flexible,                         transoral; with control of bleeding, any method Diagnosis Code(s):     --- Professional ---                        K31.819, Angiodysplasia of stomach and duodenum  without bleeding                        K21.00, Gastro-esophageal reflux disease with                         esophagitis, without bleeding CPT copyright 2022 American Medical Association. All rights reserved. The codes documented in this report are preliminary and upon coder review may  be revised to meet current compliance requirements. Dr. Libby Maw Toney Reil MD, MD 07/27/2023 10:32:03 AM This report has been signed electronically. Number of Addenda: 0 Note Initiated On: 07/27/2023 9:58 AM Estimated Blood Loss:  Estimated blood loss: none.      [facility]

## 2023-07-27 NOTE — Transfer of Care (Signed)
Immediate Anesthesia Transfer of Care Note  Patient: Ryan Rose  Procedure(s) Performed: ESOPHAGOGASTRODUODENOSCOPY (EGD) WITH PROPOFOL HOT HEMOSTASIS (ARGON PLASMA COAGULATION/BICAP)  Patient Location: Endoscopy Unit  Anesthesia Type:General  Level of Consciousness: drowsy  Airway & Oxygen Therapy: Patient Spontanous Breathing and Patient connected to face mask oxygen  Post-op Assessment: Report given to RN and Post -op Vital signs reviewed and stable  Post vital signs: Reviewed and stable  Last Vitals:  Vitals Value Taken Time  BP 95/72 07/27/23 1034  Temp 36.3 C 07/27/23 1033  Pulse 83 07/27/23 1036  Resp 12 07/27/23 1036  SpO2 100 % 07/27/23 1036  Vitals shown include unfiled device data.  Last Pain:  Vitals:   07/27/23 1033  TempSrc: Temporal  PainSc: Asleep         Complications: No notable events documented.

## 2023-07-27 NOTE — Anesthesia Preprocedure Evaluation (Signed)
Anesthesia Evaluation  Patient identified by MRN, date of birth, ID band Patient awake    Reviewed: Allergy & Precautions, NPO status , Patient's Chart, lab work & pertinent test results  History of Anesthesia Complications Negative for: history of anesthetic complications  Airway Mallampati: III  TM Distance: >3 FB Neck ROM: full    Dental  (+) Edentulous Upper, Edentulous Lower, Dental Advidsory Given   Pulmonary neg pulmonary ROS, Current Smoker and Patient abstained from smoking.   Pulmonary exam normal        Cardiovascular hypertension, On Medications (-) angina + CAD and + Peripheral Vascular Disease  Normal cardiovascular exam     Neuro/Psych  Neuromuscular disease  negative psych ROS   GI/Hepatic negative GI ROS,,,(+)     substance abuse  alcohol use  Endo/Other  negative endocrine ROS    Renal/GU negative Renal ROS  negative genitourinary   Musculoskeletal   Abdominal   Peds  Hematology  (+) Blood dyscrasia, anemia   Anesthesia Other Findings Past Medical History: No date: Anemia No date: Aortic atherosclerosis (HCC) No date: Arthritis No date: Bilateral carpal tunnel syndrome No date: Coronary artery disease No date: Diastolic dysfunction     Comment:  a.) TTE 08/24/2022: EF 60-65%, mild-mod MR, G1DD. No date: ETOH abuse No date: Hepatic steatosis No date: History of methicillin resistant staphylococcus aureus (MRSA) No date: HLD (hyperlipidemia) No date: HTN (hypertension) 05/28/2022: Infrarenal abdominal aortic aneurysm (AAA) without  rupture (HCC)     Comment:  a.) CT AP 05/28/2022: saccular thrombosed; measures 4.4               x 3.2 cm 03/21/2023: Nausea & vomiting No date: PVD (peripheral vascular disease) with claudication (HCC) No date: Sigmoid diverticulosis No date: Transaminitis  Past Surgical History: 08/20/2021: CERVICAL LAMINOPLASTY     Comment:  right C3-C6 No date:  COLONOSCOPY 09/09/2022: ENDOVASCULAR REPAIR/STENT GRAFT; N/A     Comment:  Procedure: ENDOVASCULAR REPAIR/STENT GRAFT;  Surgeon:               Renford Dills, MD;  Location: [facility] INVASIVE CV LAB;               Service: Cardiovascular;  Laterality: N/A; No date: SKIN GRAFT     Comment:  to hand and forearm  BMI    Body Mass Index: 20.88 kg/m      Reproductive/Obstetrics negative OB ROS                             Anesthesia Physical Anesthesia Plan  ASA: 3  Anesthesia Plan: General   Post-op Pain Management: Minimal or no pain anticipated   Induction: Intravenous  PONV Risk Score and Plan: 1 and Propofol infusion and TIVA  Airway Management Planned: Natural Airway and Nasal Cannula  Additional Equipment:   Intra-op Plan:   Post-operative Plan:   Informed Consent: I have reviewed the patients History and Physical, chart, labs and discussed the procedure including the risks, benefits and alternatives for the proposed anesthesia with the patient or authorized representative who has indicated his/her understanding and acceptance.     Dental Advisory Given  Plan Discussed with: Anesthesiologist, CRNA and Surgeon  Anesthesia Plan Comments: (Patient consented for risks of anesthesia including but not limited to:  - adverse reactions to medications - risk of airway placement if required - damage to eyes, teeth, lips or other oral mucosa - nerve damage due to positioning  -  sore throat or hoarseness - Damage to heart, brain, nerves, lungs, other parts of body or loss of life  Patient voiced understanding.)        Anesthesia Quick Evaluation

## 2023-07-28 ENCOUNTER — Encounter: Payer: Self-pay | Admitting: Gastroenterology

## 2023-07-29 NOTE — Anesthesia Postprocedure Evaluation (Signed)
Anesthesia Post Note  Patient: Ryan Rose  Procedure(s) Performed: ESOPHAGOGASTRODUODENOSCOPY (EGD) WITH PROPOFOL HOT HEMOSTASIS (ARGON PLASMA COAGULATION/BICAP)  Patient location during evaluation: Endoscopy Anesthesia Type: General Level of consciousness: awake and alert Pain management: pain level controlled Vital Signs Assessment: post-procedure vital signs reviewed and stable Respiratory status: spontaneous breathing, nonlabored ventilation, respiratory function stable and patient connected to nasal cannula oxygen Cardiovascular status: blood pressure returned to baseline and stable Postop Assessment: no apparent nausea or vomiting Anesthetic complications: no   No notable events documented.   Last Vitals:  Vitals:   07/27/23 1033 07/27/23 1053  BP: 95/72 104/82  Pulse: 85   Resp: 13   Temp: (!) 36.3 C   SpO2: 100%     Last Pain:  Vitals:   07/27/23 1053  TempSrc:   PainSc: 0-No pain                 Lenard Simmer

## 2023-08-11 DEATH — deceased

## 2023-09-30 IMAGING — MR MR CERVICAL SPINE W/O CM
5 series · 37 of 48 positions shown · non-contrast
Comparison: None.

CLINICAL DATA: Bilateral hand numbness over the last 4 years,
worsening over time.

EXAM:
MRI CERVICAL SPINE WITHOUT CONTRAST
TECHNIQUE: Multiplanar, multisequence MR imaging of the cervical spine was
performed. No intravenous contrast was administered.

[Series 5: T2 · sagittal · 3.0mm · 0.62mm/px · 6 of 15 slices shown (1 of 2)]
[im 1/15]
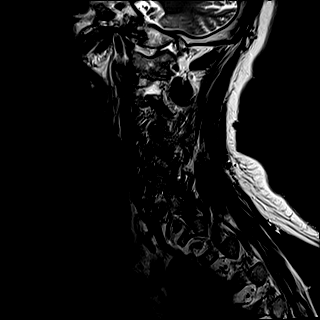
[im 3/15]
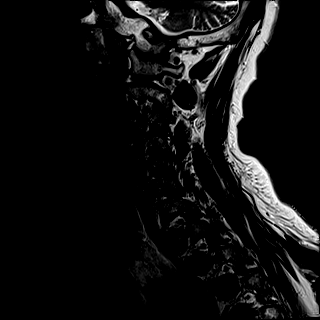
[im 6/15]
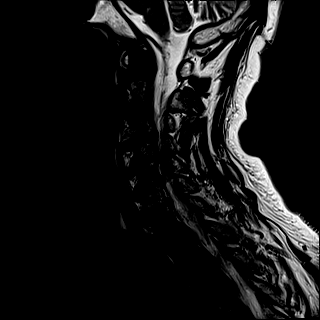
[im 9/15]
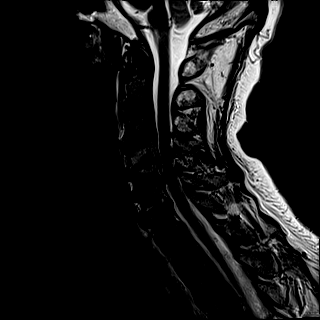
[im 12/15]
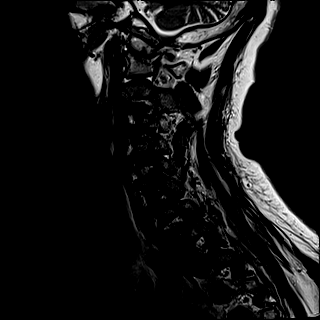
[im 15/15]
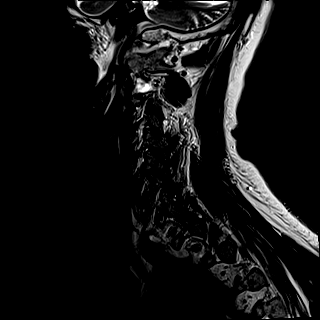

[Series 6: FLAIR · sagittal · 3.0mm · 0.78mm/px · 7 of 15 slices shown]
[im 1/15]
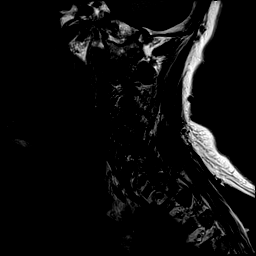
[im 3/15]
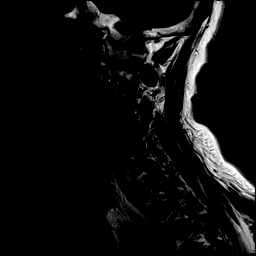
[im 5/15]
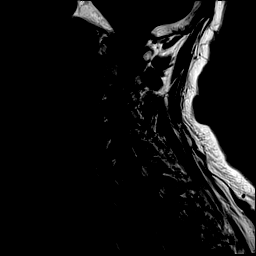
[im 8/15]
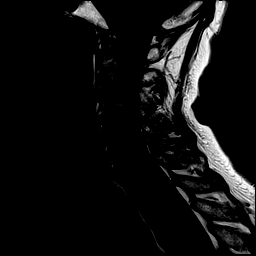
[im 10/15]
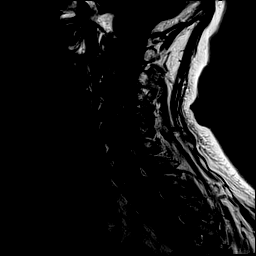
[im 12/15]
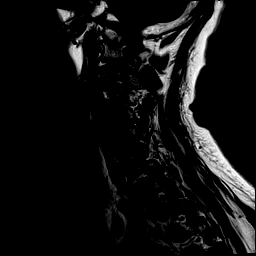
[im 15/15]
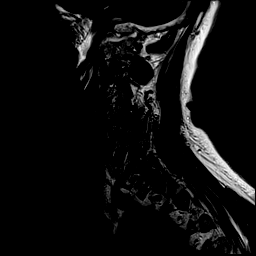

[Series 7: STIR · sagittal · 3.0mm · 0.62mm/px · 7 of 15 slices shown]
[im 1/15]
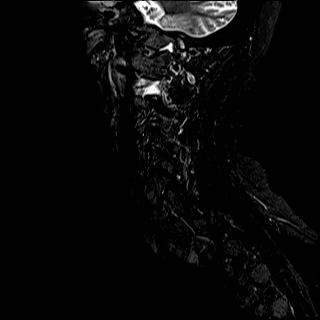
[im 3/15]
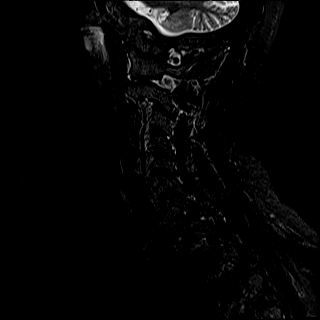
[im 5/15]
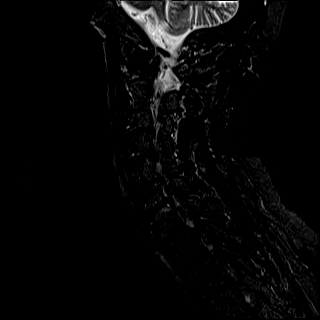
[im 8/15]
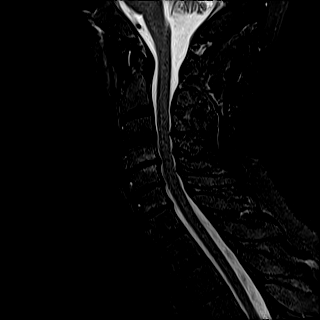
[im 10/15]
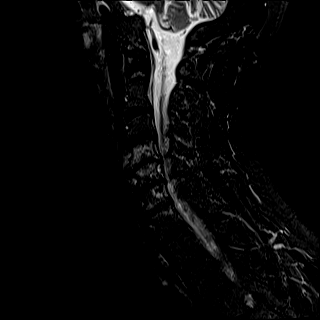
[im 12/15]
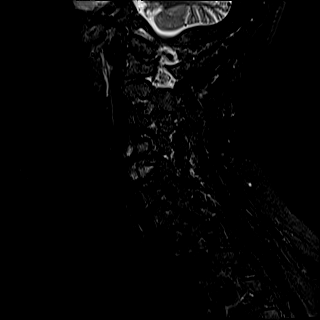
[im 15/15]
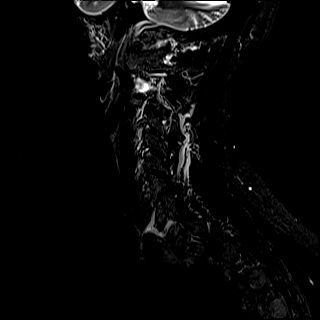

[Series 8: T2 · axial · 3.0mm · 0.70mm/px · z∈[-225,-130]mm · 9 of 29 slices shown (2 of 2)]
[im 1/29]
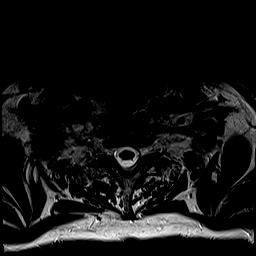
[im 3/29]
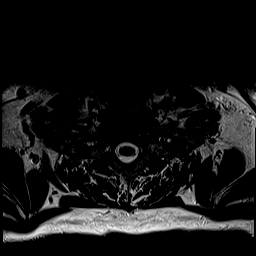
[im 5/29]
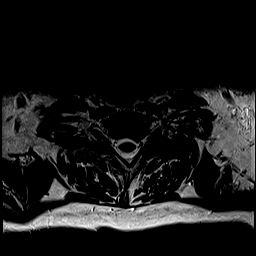
[im 9/29]
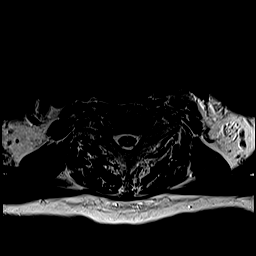
[im 13/29]
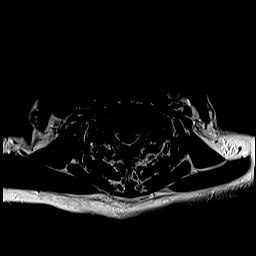
[im 16/29]
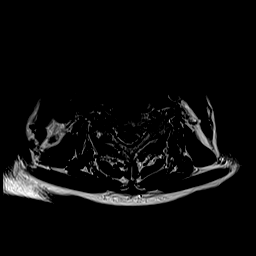
[im 20/29]
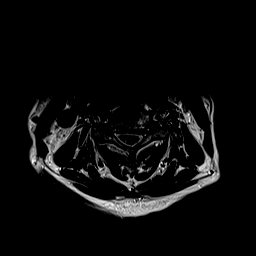
[im 24/29]
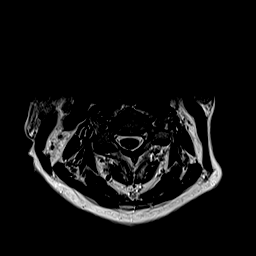
[im 29/29]
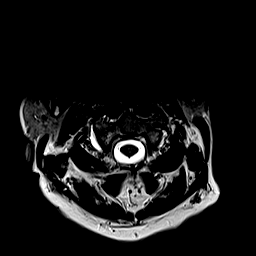

[Series 9: ax mpgr · axial · 3.0mm · 0.35mm/px · z∈[-225,-130]mm · 8 of 29 slices shown]
[im 1/29]
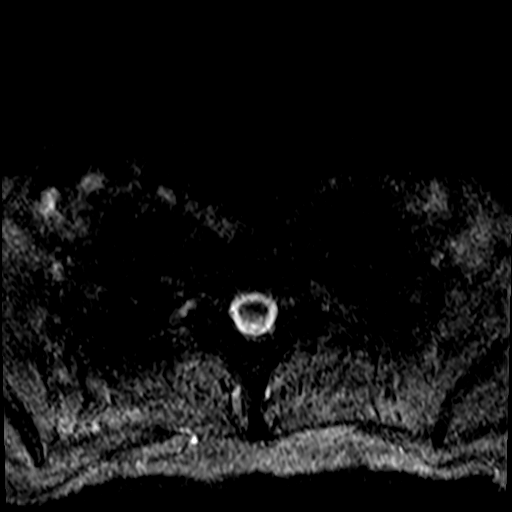
[im 5/29]
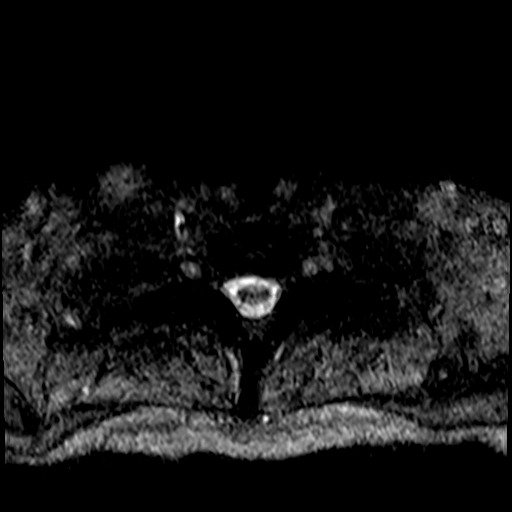
[im 9/29]
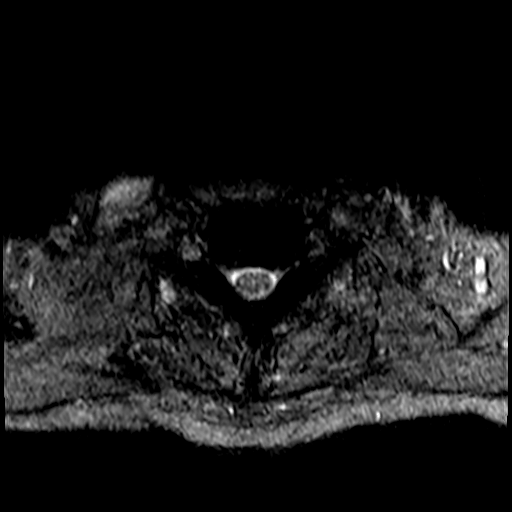
[im 13/29]
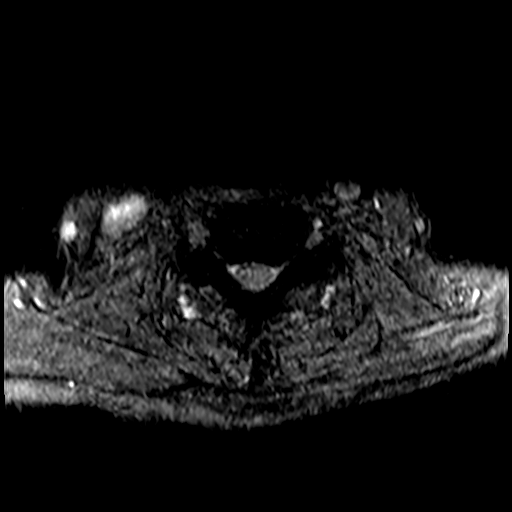
[im 16/29]
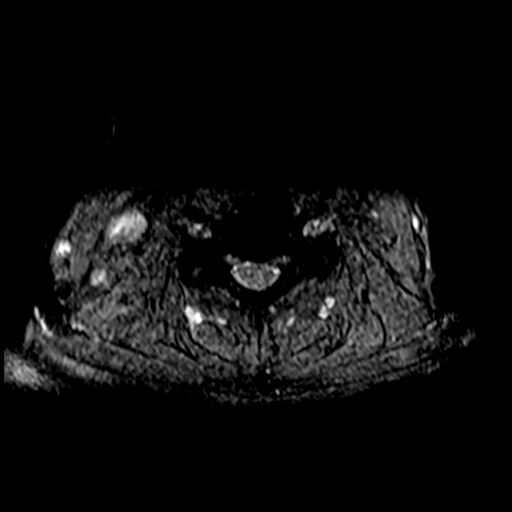
[im 20/29]
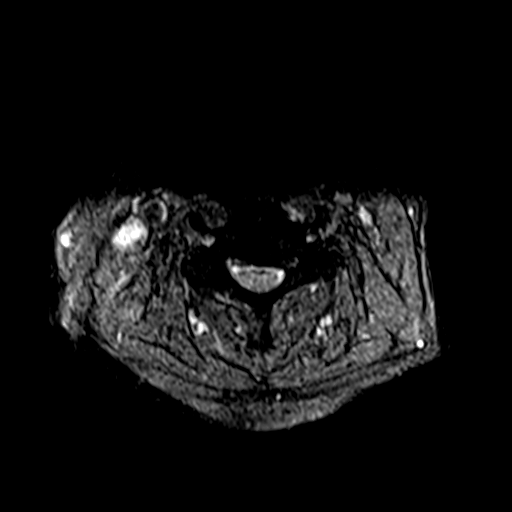
[im 24/29]
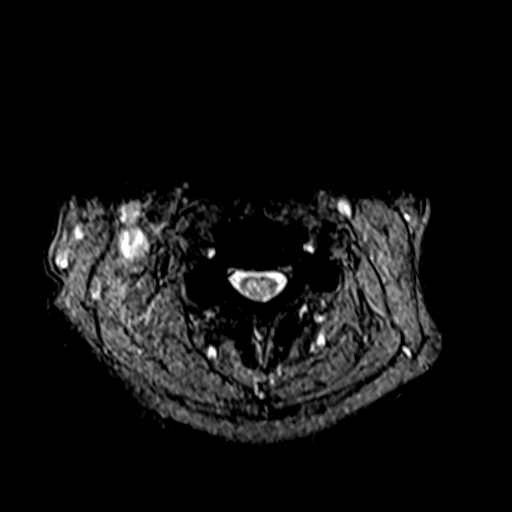
[im 29/29]
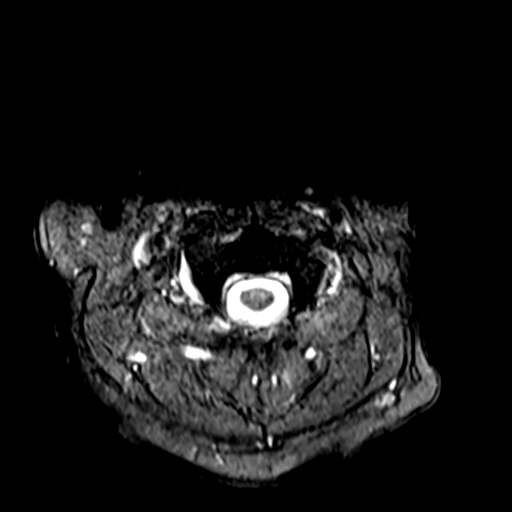

[37 of 48 positions shown; findings below may reference images not displayed]

FINDINGS: Alignment: No malalignment.

Vertebrae: No fracture or primary bone lesion. There discogenic
endplate edematous changes at C4-5, C5-6 and C6-7, which could
contribute to cervicalgia.

Cord: No primary cord lesion. See below regarding stenosis. Early
compressive myelopathy at C3-4 and C4-5.

Posterior Fossa, vertebral arteries, paraspinal tissues: Negative

Disc levels:

Foramen magnum is widely patent.  C1-2 and C2-3 are normal.

C3-4: Endplate osteophytes and mild bulging of the disc. No facet
arthropathy. Canal stenosis with AP diameter in the midline
measuring 8.9 mm. Some triangle a shin of the cord with early
abnormal T2 signal within the cord, particularly on the right.
Bilateral foraminal stenosis.

C4-5: Endplate osteophytes and bulging of the disc. Mild posterior
ligamentous prominence. Narrowing of the canal with AP diameter in
the midline only 8.5 mm. Effacement of the subarachnoid space and
some deformity of the cord. Minimal T2 signal visible within the
cord. Bilateral foraminal narrowing that could affect either C5
nerve.

C5-6: Endplate osteophytes and bulging of the disc. AP diameter of
the canal is narrowed, measuring 9.7 mm. Effacement of the
subarachnoid space and slight deformity of the cord. No definite
abnormal cord T2 signal. Bilateral foraminal stenosis could affect
either C6 nerve.

C6-7: Endplate osteophytes and bulging of the disc. No compressive
canal stenosis. Moderate bilateral foraminal stenosis could affect
the C7 nerves.

C7-T1: Normal interspace.
IMPRESSION: Degenerative spondylosis at C3-4, C4-5, C5-6 and C6-7. Central canal
stenosis at C3-4, C4-5 and C5-6 with effacement of the subarachnoid
space and some cord deformity. Early abnormal T2 signal within the
cord at C3-4 and C4-5 likely indicate early compressive myelopathy.

Bilateral foraminal stenosis at C4-5, C5-6 and C6-7 that could
compress the exiting nerves.

Discogenic endplate edematous marrow changes at C4-5, C5-6 and C6-7
which could contribute to neck pain.

## 2023-09-30 IMAGING — MR MR HEAD W/O CM
11 series · 48 of 48 positions shown · non-contrast
Comparison: None.

CLINICAL DATA: Bilateral hand numbness over the last several years
which is worsening.

EXAM:
MRI HEAD WITHOUT CONTRAST
TECHNIQUE: Multiplanar, multiecho pulse sequences of the brain and surrounding
structures were obtained without intravenous contrast.

[Series 5: ax dwi_tracew · axial · 3.0mm · 0.65mm/px · z∈[-92,+63]mm · 5 of 48 slices shown]
[im 1/48]
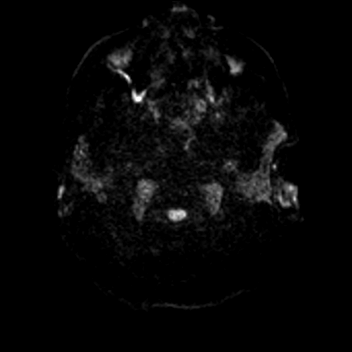
[im 12/48]
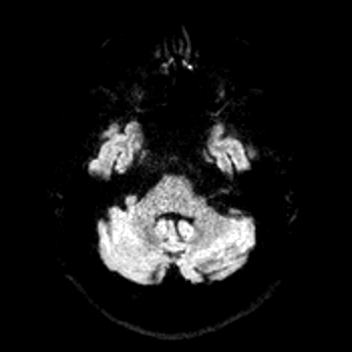
[im 24/48]
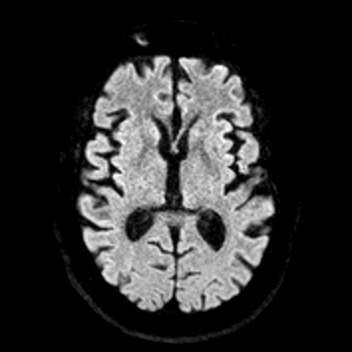
[im 36/48]
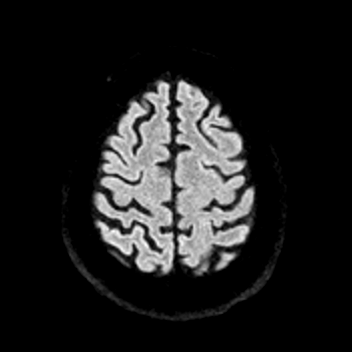
[im 48/48]
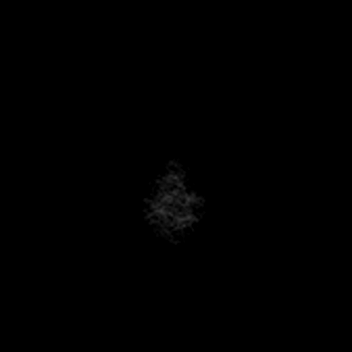

[Series 6: ax dwi_adc · axial · 3.0mm · 0.65mm/px · z∈[-92,+63]mm · 4 of 48 slices shown]
[im 1/48]
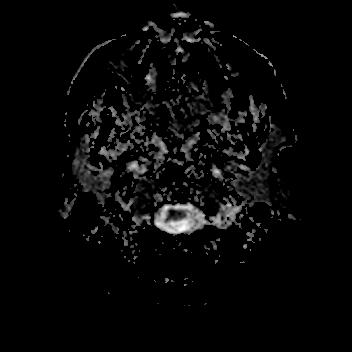
[im 16/48]
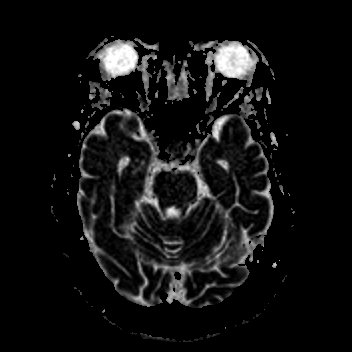
[im 32/48]
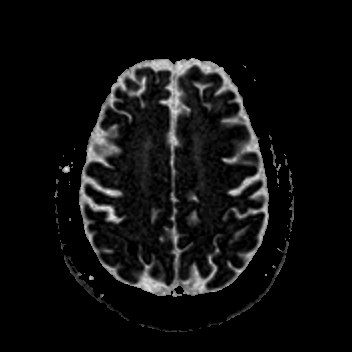
[im 48/48]
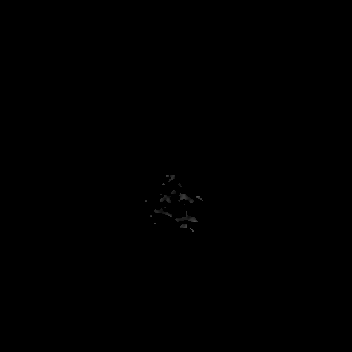

[Series 7: cor dwi_tracew · coronal · 5.0mm · 0.68mm/px · 3 of 40 slices shown]
[im 1/40]
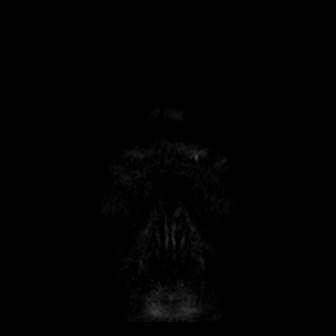
[im 20/40]
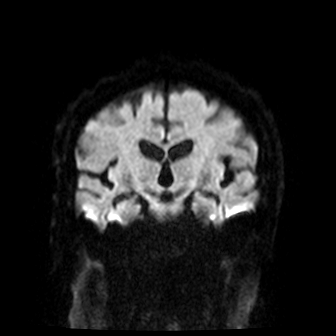
[im 40/40]
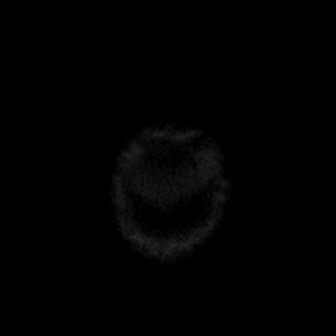

[Series 8: cor dwi_adc · coronal · 5.0mm · 0.68mm/px · 3 of 40 slices shown]
[im 1/40]
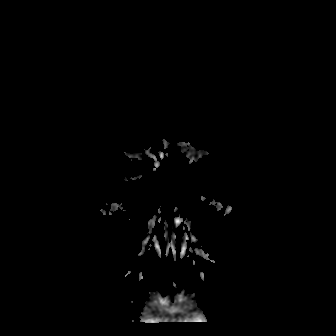
[im 20/40]
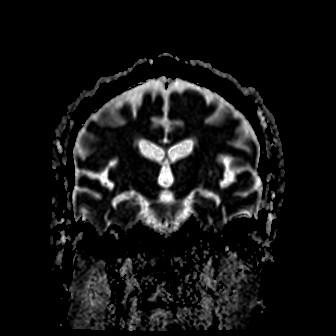
[im 40/40]
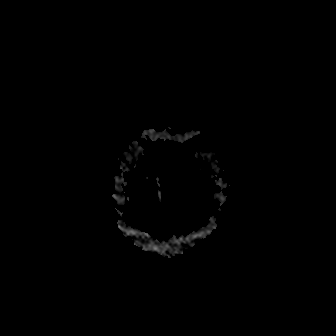

[Series 9: T1 · sagittal · 5.0mm · 0.62mm/px · 2 of 23 slices shown (1 of 2)]
[im 1/23]
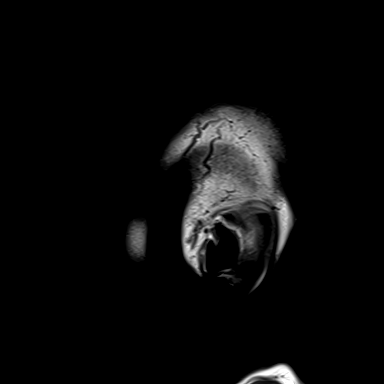
[im 23/23]
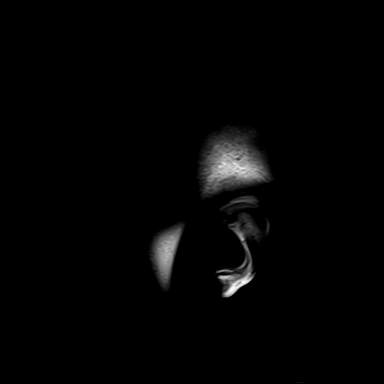

[Series 10: T2 · axial · 5.0mm · 0.53mm/px · z∈[-94,+61]mm · 2 of 27 slices shown (1 of 2)]
[im 1/27]
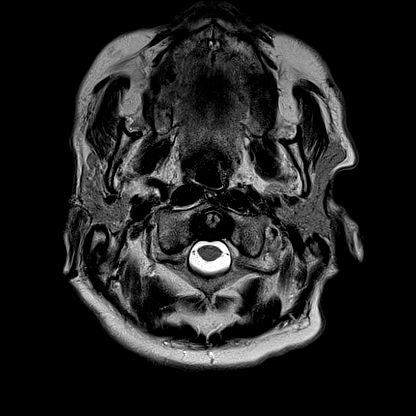
[im 27/27]
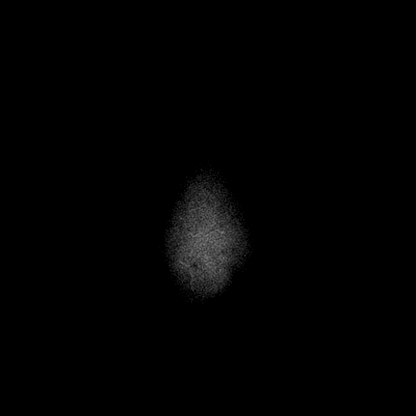

[Series 12: pha_images · axial · 3.0mm · 0.90mm/px · z∈[-99,+66]mm · 4 of 56 slices shown]
[im 1/56]
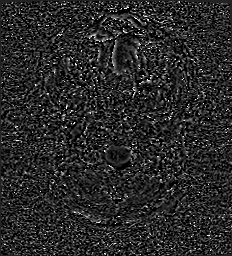
[im 19/56]
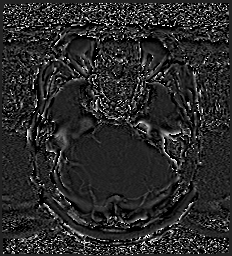
[im 37/56]
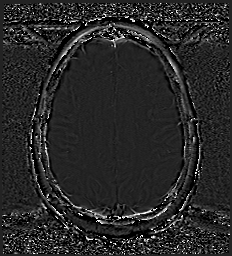
[im 56/56]
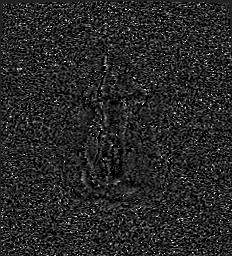

[Series 13: swi_images · axial · 3.0mm · 0.90mm/px · z∈[-99,+66]mm · 4 of 56 slices shown]
[im 1/56]
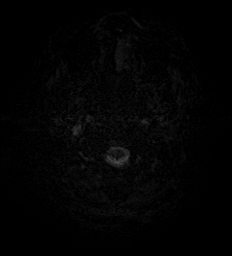
[im 19/56]
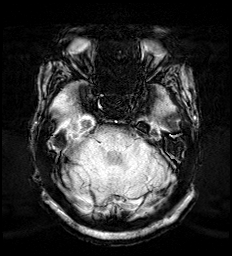
[im 37/56]
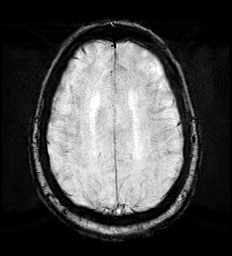
[im 56/56]
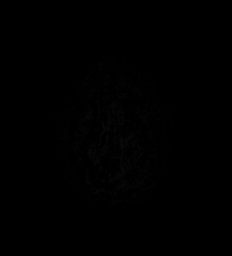

[Series 16: T1 · axial · 1.0mm · 0.98mm/px · z∈[-104,+71]mm · 14 of 176 slices shown (2 of 2)]
[im 1/176]
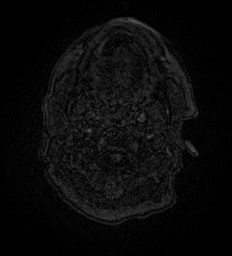
[im 14/176]
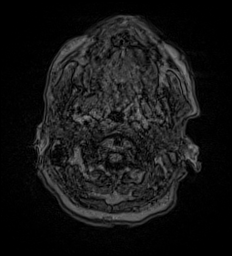
[im 27/176]
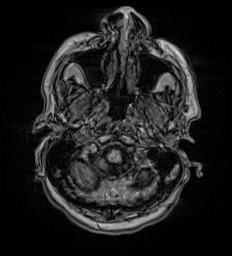
[im 41/176]
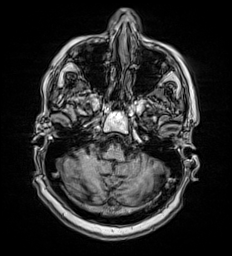
[im 54/176]
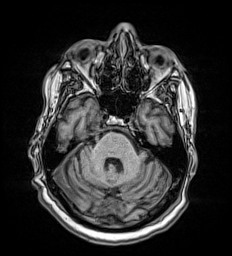
[im 68/176]
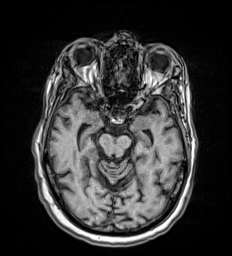
[im 81/176]
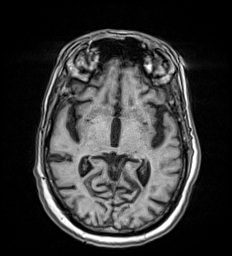
[im 95/176]
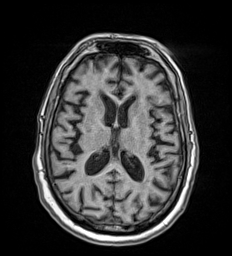
[im 108/176]
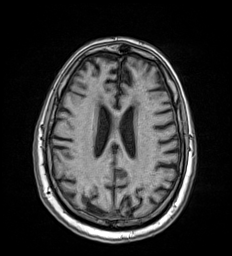
[im 122/176]
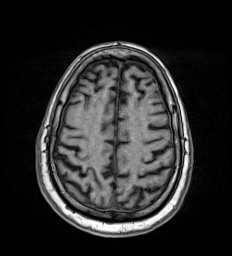
[im 135/176]
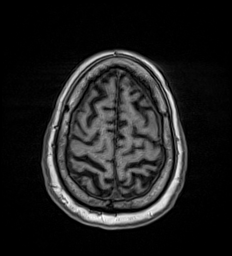
[im 149/176]
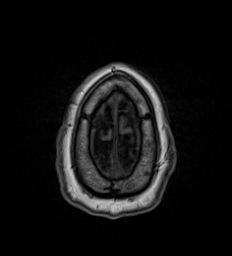
[im 162/176]
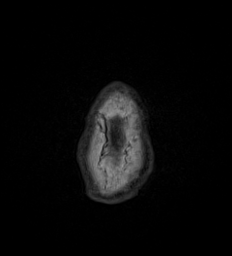
[im 176/176]
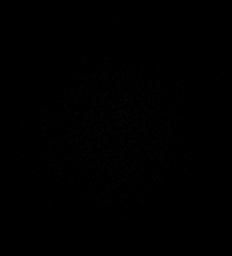

[Series 17: FLAIR · axial · 3.0mm · 0.69mm/px · z∈[-97,+64]mm · 4 of 55 slices shown]
[im 1/55]
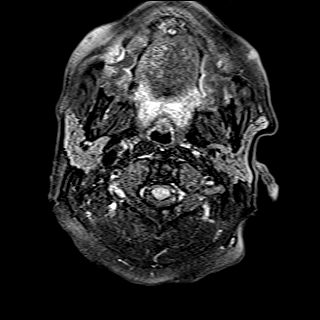
[im 19/55]
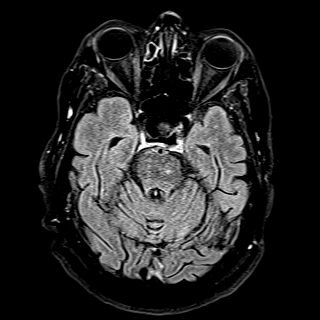
[im 37/55]
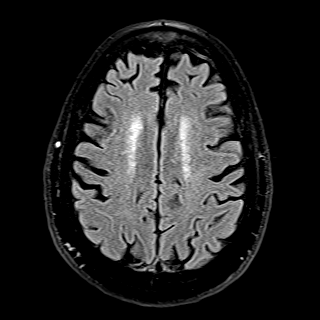
[im 55/55]
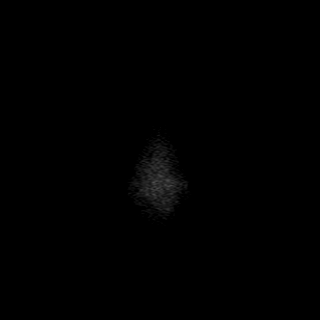

[Series 18: T2 · coronal · 5.0mm · 0.69mm/px · 3 of 33 slices shown (2 of 2)]
[im 1/33]
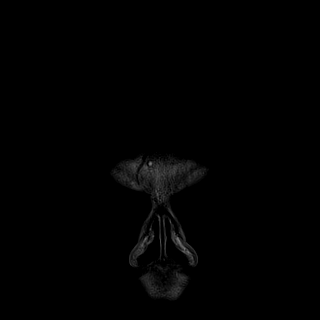
[im 17/33]
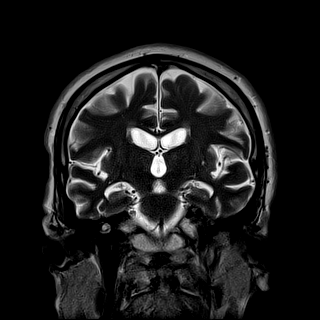
[im 33/33]
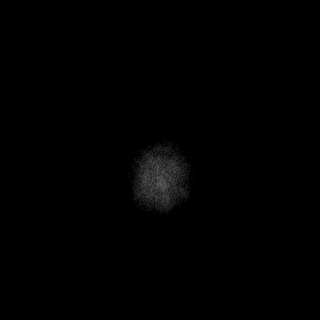

[48 of 48 positions shown; findings below may reference images not displayed]

FINDINGS: Brain: Diffusion imaging does not show any acute or subacute
infarction. Mild age related volume loss. Minimal small vessel
change of the pons and cerebral hemispheric white matter, often seen
at this age. No cortical or large vessel territory infarction.
Incidental 1 cm cavernoma within the right posterior temporoparietal
junction without evidence of recent bleeding or surrounding edema,
likely incidental. No evidence of neoplastic mass lesion, recent
hemorrhage, hydrocephalus or extra-axial collection.

Vascular: Major vessels at the base of the brain show flow.

Skull and upper cervical spine: Negative

Sinuses/Orbits: Clear/normal

Other: None
IMPRESSION: Mild age related volume loss and mild/minimal small vessel change of
the pons and cerebral hemispheric white matter, often seen at this
age.

Likely incidental 1 cm cavernoma at the right posterior
temporal/parietal junction without evidence of recent bleeding or
surrounding edema.
